# Patient Record
Sex: Female | Born: 1950
Health system: Southern US, Community
[De-identification: ages and names within clinical notes are randomized; demographics above are authoritative.]

## PROBLEM LIST (undated history)

## (undated) DIAGNOSIS — E119 Type 2 diabetes mellitus without complications: Secondary | ICD-10-CM

## (undated) DIAGNOSIS — I2510 Atherosclerotic heart disease of native coronary artery without angina pectoris: Secondary | ICD-10-CM

## (undated) DIAGNOSIS — R011 Cardiac murmur, unspecified: Secondary | ICD-10-CM

## (undated) DIAGNOSIS — R112 Nausea with vomiting, unspecified: Secondary | ICD-10-CM

## (undated) DIAGNOSIS — Z86718 Personal history of other venous thrombosis and embolism: Secondary | ICD-10-CM

## (undated) DIAGNOSIS — E785 Hyperlipidemia, unspecified: Secondary | ICD-10-CM

## (undated) DIAGNOSIS — Z9289 Personal history of other medical treatment: Secondary | ICD-10-CM

## (undated) DIAGNOSIS — M1990 Unspecified osteoarthritis, unspecified site: Secondary | ICD-10-CM

## (undated) DIAGNOSIS — Z8719 Personal history of other diseases of the digestive system: Secondary | ICD-10-CM

## (undated) DIAGNOSIS — N3010 Interstitial cystitis (chronic) without hematuria: Secondary | ICD-10-CM

## (undated) DIAGNOSIS — K635 Polyp of colon: Secondary | ICD-10-CM

## (undated) DIAGNOSIS — Z9109 Other allergy status, other than to drugs and biological substances: Secondary | ICD-10-CM

## (undated) DIAGNOSIS — I350 Nonrheumatic aortic (valve) stenosis: Secondary | ICD-10-CM

## (undated) DIAGNOSIS — D649 Anemia, unspecified: Secondary | ICD-10-CM

## (undated) DIAGNOSIS — Z9889 Other specified postprocedural states: Secondary | ICD-10-CM

## (undated) DIAGNOSIS — I10 Essential (primary) hypertension: Secondary | ICD-10-CM

## (undated) DIAGNOSIS — K219 Gastro-esophageal reflux disease without esophagitis: Secondary | ICD-10-CM

## (undated) DIAGNOSIS — IMO0001 Reserved for inherently not codable concepts without codable children: Secondary | ICD-10-CM

## (undated) HISTORY — DX: Hyperlipidemia, unspecified: E78.5

## (undated) HISTORY — PX: CATARACT EXTRACTION, BILATERAL: SHX1313

## (undated) HISTORY — DX: Essential (primary) hypertension: I10

## (undated) HISTORY — PX: BILATERAL SALPINGOOPHORECTOMY: SHX1223

## (undated) HISTORY — PX: CYSTOSCOPY: SHX5120

## (undated) HISTORY — PX: APPENDECTOMY: SHX54

## (undated) HISTORY — DX: Interstitial cystitis (chronic) without hematuria: N30.10

## (undated) HISTORY — PX: OTHER SURGICAL HISTORY: SHX169

## (undated) HISTORY — PX: TOTAL ABDOMINAL HYSTERECTOMY: SHX209

---

## 1969-01-16 DIAGNOSIS — Z86718 Personal history of other venous thrombosis and embolism: Secondary | ICD-10-CM

## 1969-01-16 HISTORY — DX: Personal history of other venous thrombosis and embolism: Z86.718

## 1999-03-25 ENCOUNTER — Other Ambulatory Visit: Admission: RE | Admit: 1999-03-25 | Discharge: 1999-03-25 | Payer: Self-pay | Admitting: *Deleted

## 1999-07-10 ENCOUNTER — Encounter: Admission: RE | Admit: 1999-07-10 | Discharge: 1999-07-10 | Payer: Self-pay | Admitting: Urology

## 1999-07-10 ENCOUNTER — Encounter: Payer: Self-pay | Admitting: Urology

## 1999-08-01 ENCOUNTER — Ambulatory Visit (HOSPITAL_COMMUNITY): Admission: RE | Admit: 1999-08-01 | Discharge: 1999-08-01 | Payer: Self-pay | Admitting: *Deleted

## 1999-08-01 ENCOUNTER — Encounter (INDEPENDENT_AMBULATORY_CARE_PROVIDER_SITE_OTHER): Payer: Self-pay

## 1999-08-13 ENCOUNTER — Ambulatory Visit (HOSPITAL_COMMUNITY): Admission: RE | Admit: 1999-08-13 | Discharge: 1999-08-13 | Payer: Self-pay | Admitting: *Deleted

## 1999-08-13 ENCOUNTER — Encounter: Payer: Self-pay | Admitting: *Deleted

## 2000-07-02 ENCOUNTER — Other Ambulatory Visit: Admission: RE | Admit: 2000-07-02 | Discharge: 2000-07-02 | Payer: Self-pay | Admitting: *Deleted

## 2001-04-21 ENCOUNTER — Ambulatory Visit (HOSPITAL_COMMUNITY): Admission: RE | Admit: 2001-04-21 | Discharge: 2001-04-21 | Payer: Self-pay | Admitting: Gastroenterology

## 2002-02-27 ENCOUNTER — Encounter: Payer: Self-pay | Admitting: Internal Medicine

## 2002-02-27 ENCOUNTER — Inpatient Hospital Stay (HOSPITAL_COMMUNITY): Admission: EM | Admit: 2002-02-27 | Discharge: 2002-03-01 | Payer: Self-pay | Admitting: Emergency Medicine

## 2003-05-19 HISTORY — PX: COLONOSCOPY: SHX174

## 2003-12-04 ENCOUNTER — Ambulatory Visit (HOSPITAL_COMMUNITY): Admission: RE | Admit: 2003-12-04 | Discharge: 2003-12-04 | Payer: Self-pay | Admitting: Urology

## 2004-04-09 ENCOUNTER — Ambulatory Visit: Payer: Self-pay | Admitting: Internal Medicine

## 2007-09-06 ENCOUNTER — Emergency Department (HOSPITAL_COMMUNITY): Admission: EM | Admit: 2007-09-06 | Discharge: 2007-09-06 | Payer: Self-pay | Admitting: Emergency Medicine

## 2007-09-23 ENCOUNTER — Encounter (INDEPENDENT_AMBULATORY_CARE_PROVIDER_SITE_OTHER): Payer: Self-pay | Admitting: *Deleted

## 2008-07-19 ENCOUNTER — Encounter: Payer: Self-pay | Admitting: Internal Medicine

## 2010-10-03 NOTE — H&P (Signed)
[name], [name]                        ACCOUNT NO.:  [id number]   MEDICAL RECORD NO.:  [id number]                   PATIENT TYPE:  INP   LOCATION:  1828                                 FACILITY:  [facility]   PHYSICIAN:  Veneda Melter, M.D. [facility]               DATE OF BIRTH:  10-20-1950   DATE OF ADMISSION:  02/27/2002  DATE OF DISCHARGE:                                HISTORY & PHYSICAL   HISTORY:  [name] is a 60 year old white female without prior cardiac  history who presents with substernal chest pressure. The patient noted onset  of pressure yesterday evening approximately 9 p.m. This was associated with  some shortness of breath, no nausea or vomiting, mild diaphoresis. It  subsided some, and the patient went to sleep; however, through the night,  she had intermittent episodes of discomfort and awoke at 4 a.m. with  substernal chest pressure. She has continued to have on and off pain. It  seemed to increase some with exertion and was alleviated with rest. She also  noted significant fatigue the past 24 hours and finally presented to the  emergency room where her pain is subsiding with IV nitroglycerin. She denies  any syncope or presyncope. No palpitations. No bright red blood per rectum,  melena, hematuria, or dysuria. She has had intentional weight loss of 35  pounds in the last six months and has been exercising on a regular basis.  Review of systems otherwise also notable for mild headaches in the morning,  otherwise negative.   PAST MEDICAL HISTORY:  Notable for gastroesophageal reflux disease, mitral  valve prolapse, recent history of anorexia.   PAST SURGICAL HISTORY:  She is status post total abdominal hysterectomy with  bilateral salpingo-oophorectomy and appendectomy.   ALLERGIES:  Topical iodine which causes blistering, sulfa.   CURRENT MEDICATIONS:  Nexium 40 mg q.d.   SOCIAL HISTORY:  The patient lives in [place]. She is married. 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 She has a  history of tobacco abuse, 35 pack years, quit 16 years ago. Uses alcohol  socially.   FAMILY HISTORY:  Notable for coronary artery disease in her father who  developed this in his 83s.   PHYSICAL EXAMINATION:  GENERAL:  This is a well-developed, well-nourished  white female in no acute distress. She is afebrile. Blood pressure of  118/63, pulse 74, respirations 20.  HEENT:  Pupils are equal, round, and reactive to light. Extraocular  movements are intact. Oropharynx shows no lesions.  NECK:  Supple without adenopathy, no bruits.  HEART:  Regular rate without murmurs.  LUNGS:  Clear to auscultation.  ABDOMEN:  Soft, nontender.  EXTREMITIES:  No edema. Peripheral pulses are 3+ and equal bilaterally.  Motor strength is 5/5. Sensory is intact to touch. Cranial nerves II-XII are  intact.   LABORATORY DATA:  ECG shows normal sinus rhythm  at 67 with no acute ischemic  changes. Laboratory data:  White count is 5.4, hemoglobin of 13.8,  hematocrit 41.0, platelets 285. Sodium is 137, potassium 3.4, chloride 103,  bicarb 27, BUN 14, creatinine 0.9. Initial CK is 80, MB is 3.2, troponin I  is 0.01.   ASSESSMENT/PLAN:  [name] is a 60 year old female who presents with almost  24-hour history of substernal chest pain with associated shortness of breath  and diaphoresis. This pain is clearly different than her reflux pain which  is a lower, more of a burning pain in the mid epigastrium. This feels more  as though someone is sitting on her chest. This is very worsening for  unstable angina. The patient will be admitted to the hospital, stabilized  medically with IV nitroglycerin. She will be given Lovenox, aspirin, and  Plavix. We will consider use of 2B3 should she have persistence of pain.  Initial enzymes do not suggest acute injury. We will follow these over the  next 24 hours. We will also replete her hypokalemia with oral   supplementation. Given her high probability for coronary artery disease,  discussed further treatment options with the patient including stress injury  study, cardiac catheterization, and medical therapy and have recommended  that we proceed with cardiac catheterization. The risks, benefits, and  alternatives of cardiac catheterization and possible angioplasty were  discussed with the patient who full understands and agrees to proceed. This  will be arranged for her tomorrow.                                               Veneda Melter, M.D. [facility]    NG/MEDQ  D:  02/27/2002  T:  02/28/2002  Job:  [id number]   cc:   Titus Dubin. Alwyn Ren, M.D. [facility]

## 2010-10-03 NOTE — Op Note (Signed)
[facility]  Patient:    Heather [name], Heather [name]                     MRN: [id number] Proc. Date: 08/01/99 Adm. Date:  [id number] Disc. Date: [id number] Attending:  Pleas Koch                           Operative Report  PREOPERATIVE DIAGNOSES:       Postmenopausal, right adnexal mass and right lower quadrant pain.  POSTOPERATIVE DIAGNOSES:      Right hydrosalpinx and pelvic adhesions of omentum to anterior abdominal wall.  OPERATION:                    Laparoscopic right salpingo-oophorectomy and lysis of adhesions.  SURGEON:                      Georgina Peer, M.D.  ASSISTANT:                    Henreitta Leber, P.A.  ANESTHESIA:                   General, Ellison Hughs., M.D.  ESTIMATED BLOOD LOSS:         Less than 25 cc.  COMPLICATIONS:                None.  INDICATIONS:                  This is a 60 year old postmenopausal, post-hysterectomy patient, status post hysterectomy and left salpingo-oophorectomy, with a history of one year of right lower quadrant pain.  She had a multicystic  mass on ultrasound and a normal CA-125.  She is also having bladder symptoms and sees Dr. Lindaann Slough, who has planned cystoscopy after this procedure.  She  understands risks and complications of laparoscopy including bowel, bladder or vascular injury and possible need for laparotomy.  She accepts these and is willing to proceed.  DESCRIPTION OF PROCEDURE:     Patient was taken to the operating room, given a general anesthetic and placed in a dorsal lithotomy position with the legs in Allen stirrups.  She was prepped with Hibiclens, abdominal and vaginal.  Her urethra as cleansed and then a red rubber catheter emptied her bladder.  A vertical subumbilical incision was made.  A sponge stick was placed in the vagina to locate its apex.  Three liters of CO2 were insufflated, creating a pneumoperitoneum under low pressures.   Laparoscopic trocar and sleeve were introduced.  A 5-mm suprapubic trocar were introduced under direct vision and a right mid-quadrant 5-mm port introduced under direct vision.  The following operative findings were noted: There appeared to be no injury from laparoscopic trocar placement.  There were omental adhesions to the anterior abdominal wall in the right mid-quadrant. There was a hydrosalpinx, which was tortuous and dilated, which was proximal to the right ovary which was not cystic.  The ovary was freely mobile and was not attached to the pelvic sidewall nor bladder.  The ureter was clearly identified distant from this complex.  The left ovary had been surgically removed and metal staples were noted and two small peritoneal cysts were also noted.  The vaginal cuff and peritoneal surface of the bladder was without pathology, as was the upper abdomen. Using bipolar cautery, the omental adhesions were taken down and sharply  dissected. Using tripolar 5-mm forceps, the infundibulopelvic ligament was cauterized and divided progressively down; the mesosalpinx was progressively cauterized and divided to its attachment just superior and lateral to the vaginal cuff.  The hydrosalpinx was then punctured and clear fluid egressed to allow for removal. The specimen was removed through the subumbilical port.  Ten cc of 0.25% Marcaine were drizzled on the operative site and 10 cc injected into the laparoscopic ports. The ports were removed, the air allowed to escape from the abdomen and the ports closed with 4-0 Dexon.  Sponge, needle and instrument counts were correct.  The patient remained under anesthesia for Dr. Lemont Fillers part of the case, which will be dictated as a separate procedure note. DD:  08/01/99 TD:  08/04/99 Job: [id number]

## 2010-10-03 NOTE — Cardiovascular Report (Signed)
   [name], [name]                        ACCOUNT NO.:  [id number]   MEDICAL RECORD NO.:  [id number]                   PATIENT TYPE:  INP   LOCATION:  6524                                 FACILITY:  [facility]   PHYSICIAN:  Salvadore Farber, M.D. [facility]         DATE OF BIRTH:  February 23, 1951   DATE OF PROCEDURE:  02/28/2002  DATE OF DISCHARGE:                              CARDIAC CATHETERIZATION   PROCEDURE:  Coronary angiography, left ventriculography, left heart  catheterization, aortography.   INDICATIONS FOR PROCEDURE:  Chest pain.   DIAGNOSTIC TECHNIQUE:  Informed consent was obtained.  Under 2% lidocaine  local anesthesia, a #6 French sheath was placed in the right femoral artery  using the modified Seldinger technique.  Angiography was performed using JL4  and JR4 catheters.  A pigtail catheter was advanced into the left ventricle.  Pressures were measured.  Ventriculography was performed via power  injection.  The catheter was then withdrawn to the ascending aorta.  Aortography was performed by power injection.  The patient tolerated the  procedure well and was transferred to the holding room in stable condition.  Sheaths are to be removed there.   COMPLICATIONS:  None.   FINDINGS:  1. Left main:  Angiographically normal.  2. LAD:  The LAD is a large vessel giving rise to a single large diagonal     branch.  It is angiographically normal.  3. Circumflex:  The circumflex gives rise to a single obtuse marginal     branch.  It is angiographically normal.  4. RCA:  The right coronary artery supplies the PDA and has no significant     stenoses.  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  5. No mitral regurgitation.  6. No aortic stenosis.  7. EF equals 67% without regional wall motion abnormality.  8. LV:  121/3/10 after the administration of contrast.   IMPRESSION/PLAN:  The patient has no coronary  stenosis to account for her  chest pain.  She has normal left ventricular size and systolic function.  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  Shunt appears trivial.  Plan for evaluation of noncardiac etiology of her chest pain.                                               Salvadore Farber, M.D. [facility]    WED/MEDQ  D:  02/28/2002  T:  02/28/2002  Job:  [id number]   cc:   Titus Dubin. Alwyn Ren, M.D. [facility]   Veneda Melter, M.D. [facility]

## 2010-10-03 NOTE — H&P (Signed)
Heather [name], [name]                        ACCOUNT NO.:  [id number]   MEDICAL RECORD NO.:  [id number]                   PATIENT TYPE:  INP   LOCATION:  6524                                 FACILITY:  [facility]   PHYSICIAN:  Titus Dubin. Alwyn Ren, M.D. [facility]         DATE OF BIRTH:  February 17, 1951   DATE OF ADMISSION:  02/27/2002  DATE OF DISCHARGE:                                HISTORY & PHYSICAL   HISTORY OF PRESENT ILLNESS:  The patient is a 60 year old employee of the  [employer] who is now admitted to observation for atypical  chest pain.   On February 26, 2002, approximately 9 p.m., she experienced heaviness in her  chest and an associated anxiousness which lasted approximately two hours.  This occurred while she was lying on the couch and was not exacerbated by  ambulation.  She had no nausea or diaphoresis associated with this.   On October 13, she was awakened at 9 a.m. with similar symptoms of a  substernal pressure nature associated with shortness of breath and  diaphoresis.  The symptoms recurred intermittently throughout the day.  She  became uncomfortable enough to notify her fellow nursing staff, who arranged  from an EKG and physician evaluation.   The substernal pressure has not radiated to the neck or the arm.   PAST MEDICAL HISTORY:  Her past medical history includes gravida 2, para 2,  an appendectomy, a total abdominal hysterectomy and bilateral salpingo-  oophorectomy for dysfunctional bleeding.   FAMILY HISTORY:  Family history is positive for myocardial infarction in  grandfather and coronary artery disease in her father.  There is no diabetes  or cancer.   SOCIAL HISTORY:  She quit smoking in 1987.  She rarely drinks alcohol.  She  exercises in kick-boxing at least weekly but not recently.   ALLERGIES:  She is allergic to IODINE and SULFA.   REVIEW OF SYSTEMS:  Review of systems include some mild headache which is  nonspecific, but more diffuse  in the frontal areas; Aleve has not been of  benefit.  She denies any genitourinary or musculoskeletal symptoms.   PHYSICAL EXAMINATION:  GENERAL:  She appears uncomfortable and complexion is  somewhat sallow.  VITAL SIGNS:  Blood pressure is 140/80, pulse 82 and regular, respiratory  rate 15.  HEENT:  There are no arteriolar changes noted on fundal exam.  Otolaryngologic exam is unremarkable.  NODES:  She has no lymphadenopathy about the head, neck or axillae.  NECK:  Thyroid is normal to palpation.  CARDIAC:  She has a grade 1 systolic murmur, loudest at the right base.  An  aortic bruit is noted without an aneurysm.  There is also a click at the  apex without definite prolapse.  CHEST:  Chest is clear to auscultation.  ABDOMEN:  The abdomen is nontender and there are no organomegaly or masses.  EXTREMITIES:  All pulses are intact and there  is no edema.  Homans sign is  negative.  MUSCULOSKELETAL:  No deficits.  NEUROPSYCHIATRIC:  No deficits.  BREASTS, PELVIC AND RECTAL:  Deferred as they are not germane to this  admission.   LABORATORY AND ACCESSORY CLINICAL DATA:  EKG reveals minor nonspecific  changes.   ASSESSMENT AND PLAN:  She will be admitted for observation to rule out an  anginal component to a chest pain.  It is anticipated that if the cardiac  enzymes are negative, a stress Cardiolite will be pursued, or possibly even  catheterization.                                                Titus Dubin. Alwyn Ren, M.D. [facility]    WFH/MEDQ  D:  02/28/2002  T:  03/01/2002  Job:  [id number]

## 2010-10-03 NOTE — Discharge Summary (Signed)
Heather [name], Heather [name]                        ACCOUNT NO.:  [id number]   MEDICAL RECORD NO.:  [id number]                   PATIENT TYPE:  INP   LOCATION:  6524                                 FACILITY:  [facility]   PHYSICIAN:  Rene Paci, M.D. [facility]          DATE OF BIRTH:  1950/11/26   DATE OF ADMISSION:  02/27/2002  DATE OF DISCHARGE:  03/01/2002                                 DISCHARGE SUMMARY   DISCHARGE DIAGNOSES:  1. Chest pain.  2. Gastroesophageal reflux disease.  3. Hypokalemia.  4. Right groin hematoma.   HISTORY OF PRESENT ILLNESS:  Briefly, the patient is a 60 year old white  female with no prior history of coronary artery disease.  On the evening  prior to this admission she developed chest pain that was substernal without  radiation.  She did have some shortness of breath.  This lasted about two  hours.  It recurred in the early hours of the morning.  Since that time the  patient's pain has been intermittent but never completely resolved.  She  does not some increased pain with exertion and diminished pain with rest.  No other aggravating factors.  She also has complained of increased fatigue  for the past 24 hours.   PAST MEDICAL HISTORY:  1. Gastroesophageal reflux disease.  2. Mitral valve prolapse.  3. Distant history of anorexia.  4. Status post total abdominal hysterectomy and bilateral salpingo-     oophorectomy.  5. Status post appendectomy.   CARDIAC RISK FACTORS:  Positive for tobacco, hyperlipidemia.  Negative for  prior coronary disease, diabetes, hypertension or family history.   HOSPITAL COURSE:  #1 - CHEST PAIN:  The patient was admitted to rule out  myocardial infarction.  The patient was evaluated by cardiology who  initiated aspirin, nitroglycerin, beta blocker and Lovenox.  They also put  her on the schedule for a cardiac catheterization.  The patient's cardiac  enzymes were negative and she ruled out for myocardial infarction.  The  patient underwent the cardiac catheterization on February 28, 2002.  This  revealed normal coronaries with an ejection fraction of 67%, no mitral  regurgitation, no aortic stenosis.  The patient's post catheterization  course was complicated by a hematoma.  Currently the hematoma is stable  without any further bleeding.  She is hemodynamically stable as well.  No  further cardiac work up was indicated.  #2 - GASTROINTESTINAL:  The patient does have a history of gastroesophageal  reflux disease. Apparently she has a history of using a pH probe eval.  I'm  not sure what the results showed.  There was a questionable history of  gastritis by endoscopy in 1992.  Reportedly she also had a negative  colonoscopy in December of 2002.  We will have the patient continue her  proton pump inhibitor and follow up as an outpatient with Dr. Kinnie Scales.  We  did check a lipase and amylase which were  negative.  An abdominal ultrasound  has been scheduled to be done as an outpatient.   LABORATORY DATA:  At discharge the CBC was normal. BMET was normal.  Lipase  and amylase were normal.  Cardiac enzymes were negative.  Total cholesterol  was 253, triglycerides 155, HDL 61, LDL 161.   DISCHARGE MEDICATIONS:  Nexium 40 mg q.d.   FOLLOW UP:  Follow up with Dr. Kinnie Scales in one month, with Dr.  Alwyn Ren in two  to three weeks and Dr. Samule Ohm as instructed.     Cornell Barman, PA [facility]                    Rene Paci, M.D. [facility]    LC/MEDQ  D:  03/01/2002  T:  03/02/2002  Job:  [id number]   cc:   Griffith Citron, M.D.   Titus Dubin. Alwyn Ren, M.D. [facility]   Salvadore Farber, M.D. [facility]  [address] [place], [place] [address]  Fax: 1

## 2010-10-03 NOTE — Op Note (Signed)
[facility]  Patient:    Heather [name], Heather [name]                     MRN: [id number] Proc. Date: 08/01/99 Adm. Date:  [id number] Disc. Date: [id number] Attending:  Pleas Koch CC:         Georgina Peer, M.D.                           Operative Report  PREOPERATIVE DIAGNOSIS:       Recurrent urinary tract infection, rule out interstitial cystitis.  POSTOPERATIVE DIAGNOSIS:      Recurrent urinary tract infection.  PROCEDURE:                    Cystoscopy and ureteral dilation.  SURGEON:                      Lindaann Slough, M.D.  ANESTHESIA:                   General.  INDICATIONS:                  Patient is a 60 year old female who has been complaining of frequency, hesitancy, voiding a small amount of urine at a time nd suprapubic discomfort for about six months.  She was treated with antibiotics but her symptoms have persisted.  She is scheduled for laparoscopy by Dr. Georgina Peer and for cystoscopy by myself.  Laparoscopy was done by Dr. Elliot Gault.  DESCRIPTION OF PROCEDURE:     After the laparoscopy, the patient was placed in he dorsal lithotomy position.  A #7 thin Wappler cystoscope was inserted in the bladder.  The bladder mucosa is normal.  There is no stone or tumor in the bladder. The ureteral orifices are in normal position and shape with clear efflux. There was no evidence of submucosal hemorrhage.  The cystoscopy was done with the 4 oblique and the right-angle lenses.  The cystoscope was then removed.  The urethra was then dilated with #30-French.  The patient tolerated the procedure well and left the OR in satisfactory condition to postanesthesia care unit. DD:  08/01/99 TD:  08/04/99 Job: [id number]

## 2011-02-10 ENCOUNTER — Encounter: Payer: Self-pay | Admitting: Internal Medicine

## 2011-02-10 ENCOUNTER — Ambulatory Visit (INDEPENDENT_AMBULATORY_CARE_PROVIDER_SITE_OTHER): Payer: BC Managed Care – PPO | Admitting: Internal Medicine

## 2011-02-10 VITALS — BP 118/78 | HR 89 | Temp 98.5°F | Wt 183.0 lb

## 2011-02-10 DIAGNOSIS — I10 Essential (primary) hypertension: Secondary | ICD-10-CM

## 2011-02-10 MED ORDER — LISINOPRIL 20 MG PO TABS: 20.0000 mg | ORAL_TABLET | Freq: Every day | ORAL | Status: DC

## 2011-02-10 NOTE — Patient Instructions (Signed)
Blood Pressure Goal  Ideally is an AVERAGE < 135/85. This AVERAGE should be calculated from @ least 5-7 BP readings taken @ different times of day on different days of week. You should not respond to isolated BP readings , but rather the AVERAGE for that week  

## 2011-02-10 NOTE — Progress Notes (Signed)
  Subjective:    Patient ID: Heather [name], female    DOB: 18-Aug-1950, 60 y.o.   MRN: [id number]  HPI  HYPERTENSION: Disease Monitoring  Blood pressure range: 121/71- 149/78 (Note: off Lisinopril 10/12.5 mg X 1 month; she ran out)  Chest pain: no   Dyspnea: no   Claudication: no              Headaches: over crown, bilaterally; better with NSAIDS & rest   Lightheadedness: no   Urinary frequency: no   Edema: no    Preventitive Healthcare:  Exercise: no, but care provider for husband who has terminal hepatic failure ; Hospice involved   Diet Pattern: no plan  Salt Restriction: yes      Review of Systems     Objective:   Physical Exam General appearance : well nourished, w/o distress.  Eyes: No conjunctival inflammation or scleral icterus is present. Mild arteriolar narrowing     Heart:  Normal rate and regular rhythm. S1 and S2 normal without gallop, click, rub or other extra sounds . Grade 1/6 systolic murmur    Lungs:Chest clear to auscultation; no wheezes, rhonchi,rales ,or rubs present.No increased work of breathing.   Abdomen: bowel sounds normal, soft and non-tender without masses, organomegaly or hernias noted.  No guarding or rebound . No AAA or bruits  Skin:Warm & dry.  Intact without suspicious lesions or rashes           Assessment & Plan:  #1 hypertension, exacerbation by the stress of her husband's illness  Plan: Low-dose ACE inhibitor without  a diuretic unless she notes edema

## 2011-11-02 ENCOUNTER — Other Ambulatory Visit: Payer: Self-pay | Admitting: Internal Medicine

## 2012-04-16 ENCOUNTER — Other Ambulatory Visit: Payer: Self-pay | Admitting: Internal Medicine

## 2012-04-18 NOTE — Telephone Encounter (Signed)
Rx sent.     PLz schedule pt appt. Last OV 02/10/11.   MW

## 2012-04-22 NOTE — Telephone Encounter (Signed)
Pt scheduled for 07/09/11 at 1:30pm.

## 2012-04-22 NOTE — Telephone Encounter (Signed)
Lmovm for pt to call office. Pt needs CPE.

## 2012-05-03 ENCOUNTER — Other Ambulatory Visit: Payer: Self-pay | Admitting: Internal Medicine

## 2012-07-08 ENCOUNTER — Ambulatory Visit (INDEPENDENT_AMBULATORY_CARE_PROVIDER_SITE_OTHER): Payer: BC Managed Care – PPO | Admitting: Internal Medicine

## 2012-07-08 ENCOUNTER — Encounter: Payer: Self-pay | Admitting: Internal Medicine

## 2012-07-08 VITALS — BP 132/78 | HR 79 | Temp 97.9°F | Resp 12 | Ht 61.5 in | Wt 183.0 lb

## 2012-07-08 DIAGNOSIS — Z0000 Encounter for general adult medical examination without abnormal findings: Secondary | ICD-10-CM

## 2012-07-08 DIAGNOSIS — N3010 Interstitial cystitis (chronic) without hematuria: Secondary | ICD-10-CM | POA: Insufficient documentation

## 2012-07-08 DIAGNOSIS — I10 Essential (primary) hypertension: Secondary | ICD-10-CM

## 2012-07-08 MED ORDER — LISINOPRIL 20 MG PO TABS: ORAL_TABLET | ORAL | Status: DC

## 2012-07-08 NOTE — Patient Instructions (Addendum)
Preventive Health Care: Exercise  30-45  minutes a day, 3-4 days a week. Walking is especially valuable in preventing Osteoporosis. Eat a low-fat diet with lots of fruits and vegetables, up to 7-9 servings per day. This would eliminate need for vitamin supplements for most individuals. Consume less than 30 grams of sugar per day from foods & drinks with High Fructose Corn Syrup as #1,2,3 or #4 on label. Please  schedule fasting Labs : BMET,Lipids, hepatic panel, CBC & dif, TSH.PLEASE BRING THESE INSTRUCTIONS TO FOLLOW UP  LAB APPOINTMENT.This will guarantee correct labs are drawn, eliminating need for repeat blood sampling ( needle sticks ! ). Diagnoses /Codes:V70.0.  If you activate the  My Chart system; lab & Xray results will be released directly  to you as soon as I review & address these through the computer. As per [place] all records must be reviewed and signed off within 72 hours; but I attempt to complete this within 36 hours unless I have no access to the electronic medical record.If I wait more than 36 hours the volume of reports becomes difficult to manage optimally. In my absence ;my partners would address the results.Critical lab results will be communicated to you ASAP. If you choose not to sign up for My Chart within 36 hours of labs being drawn; results will be reviewed & interpretation added before being copied & mailed, causing a delay in getting the results to you.If you do not receive that report within 7-10 days ,please call. Additionally you can use this system to gain direct  access to your records  if  out of town or @ an office of a  physician who is not in  the My Chart network.  This improves continuity of care & places you in control of your medical record.

## 2012-07-08 NOTE — Progress Notes (Signed)
  Subjective:    Patient ID: Heather [name], female    DOB: Jun 23, 1950, 62 y.o.   MRN: [id number]  HPI  Heather [name] is here for a physical;acute issues include tender posterior scalp area "lumps" & elevated sugars.  She has sweats which are menopausal. She's actually had some weight gain. She denies fever or chills.     Review of Systems  HYPERTENSION: Disease Monitoring: Blood pressure range-120-130/70-82  Chest pain, palpitations- no      Dyspnea- no Medications: Compliance- yes  Lightheadedness,Syncope- no Edema- no  PMH HYPERGLYCEMIA; Disease Monitoring: Blood Sugar ranges-home 6-6.2%   Polyuria/phagia/dipsia- no      Visual problems-no            Objective:   Physical Exam Gen.: Healthy and well-nourished in appearance. Alert, appropriate and cooperative throughout exam.Appears younger than stated age  Head: Normocephalic without obvious abnormalities. Eyes: No corneal or conjunctival inflammation noted. Pupils equal round reactive to light and accommodation.  Extraocular motion intact.Mono vision  lenses Ears: External  ear exam reveals no significant lesions or deformities. Canals clear .TMs normal. Hearing is grossly normal bilaterally. Nose: External nasal exam reveals no deformity or inflammation. Nasal mucosa are pink and moist. No lesions or exudates noted.  Mouth: Oral mucosa and oropharynx reveal no lesions or exudates. Teeth in good repair. Neck: No deformities, masses, or tenderness noted. Range of motion & Thyroid normal. Lungs: Normal respiratory effort; chest expands symmetrically. Lungs are clear to auscultation without rales, wheezes, or increased work of breathing. Heart: Normal rate and rhythm. Normal S1 and S2. No gallop, click, or rub. Grade 1/6 systolic murmur. Abdomen: Bowel sounds normal; abdomen soft and nontender. No masses, organomegaly or hernias noted. Genitalia: S/P TAH & BSO.                                  Musculoskeletal/extremities: No  deformity or scoliosis noted of  the thoracic or lumbar spine.  No clubbing, cyanosis, edema, or significant extremity  deformity noted. Range of motion normal .Tone & strength  Normal. Joints normal . Nail health good. Able to lie down & sit up w/o help. Negative SLR bilaterally Vascular: Carotid, radial artery, dorsalis pedis and  posterior tibial pulses are full and equal. No bruits present. Neurologic: Alert and oriented x3. Deep tendon reflexes symmetrical and normal.         Skin: Intact without suspicious lesions or rashes. Lymph: No cervical, axillary lymphadenopathy present. Psych: Mood and affect are normal. Normally interactive                                                                                        Assessment & Plan:  #1 comprehensive physical exam; no acute findings  Plan: see Orders  & Recommendations

## 2012-07-26 ENCOUNTER — Ambulatory Visit (INDEPENDENT_AMBULATORY_CARE_PROVIDER_SITE_OTHER): Payer: BC Managed Care – PPO | Admitting: Internal Medicine

## 2012-07-26 ENCOUNTER — Telehealth: Payer: Self-pay | Admitting: Internal Medicine

## 2012-07-26 ENCOUNTER — Encounter: Payer: Self-pay | Admitting: Internal Medicine

## 2012-07-26 VITALS — BP 134/88 | HR 68 | Temp 98.0°F | Wt 183.0 lb

## 2012-07-26 DIAGNOSIS — R599 Enlarged lymph nodes, unspecified: Secondary | ICD-10-CM

## 2012-07-26 DIAGNOSIS — R590 Localized enlarged lymph nodes: Secondary | ICD-10-CM

## 2012-07-26 LAB — CBC WITH DIFFERENTIAL/PLATELET
Basophils Absolute: 0 10*3/uL (ref 0.0–0.1)
Eosinophils Absolute: 0.2 10*3/uL (ref 0.0–0.7)
HCT: 40.5 % (ref 36.0–46.0)
Lymphs Abs: 2.1 10*3/uL (ref 0.7–4.0)
MCHC: 33.2 g/dL (ref 30.0–36.0)
MCV: 77.6 fl — ABNORMAL LOW (ref 78.0–100.0)
Monocytes Absolute: 0.4 10*3/uL (ref 0.1–1.0)
Monocytes Relative: 6.9 % (ref 3.0–12.0)
Platelets: 362 10*3/uL (ref 150.0–400.0)
RDW: 13.8 % (ref 11.5–14.6)

## 2012-07-26 LAB — SEDIMENTATION RATE: Sed Rate: 18 mm/hr (ref 0–22)

## 2012-07-26 MED ORDER — DOXYCYCLINE HYCLATE 100 MG PO TABS: 100.0000 mg | ORAL_TABLET | Freq: Two times a day (BID) | ORAL | Status: DC

## 2012-07-26 NOTE — Telephone Encounter (Signed)
Appointment Scheduled:  07/26/2012 11:30:00  Appointment Scheduled Provider:  N/A

## 2012-07-26 NOTE — Patient Instructions (Addendum)
If you activate the  My Chart system; lab & Xray results will be released directly  to you as soon as I review & address these through the computer. As per [place] [facility] [facility] all records must be reviewed and signed off within 72 hours; but I attempt to complete this within 36 hours unless I have no access to the electronic medical record.If I wait more than 36 hours the volume of reports becomes difficult to manage optimally. In my absence ;my partners would address the results.Critical lab results will be communicated to you ASAP. If you choose not to sign up for My Chart within 36 hours of labs being drawn; results will be reviewed & interpretation added before being copied & mailed, causing a delay in getting the results to you.If you do not receive that report within 7-10 days ,please call. Additionally you can use this system to gain direct  access to your records  if  out of town or @ an office of a  physician who is not in  the My Chart network.  This improves continuity of care & places you in control of your medical record.  Share results with all non [facility] medical staff seen

## 2012-07-26 NOTE — Progress Notes (Signed)
  Subjective:    Patient ID: Heather [name], female    DOB: 1951/02/23, 62 y.o.   MRN: [id number]  HPI She's had tender lymphadenopathy in the posterior cervical chain as well as the right clavicle for several weeks. She was seen at urgent care 07/20/12 and cephalexin prescribed. There is not been a significant response to this  She has had some sweats but this is a chronic issue. She has had bilateral frontal headaches.  She does foster kittens but denies having been scratched about the head and neck areas. She does donate her hair; the last time the posterior scalp area when she was last summer however.    Review of Systems She denies associated fever , weight loss or chills. She denies facial pain, nasal purulence, sore throat, dental pain ,otic pain or otic discharge. She denies any cough or sputum production       Objective:   Physical Exam She appears healthy and well-nourished in no distress  There's an 18 x 18 mm firm subcutaneous lesion at the left posterior scalp line. There is a pea-sized lesion on the contralateral scalp line @ that same area. There's a small pea size lymph node posterior to the right clavicle. There are minor subcutaneous changes at the posterior scalp line. The overlying skin is not cellulitic; there is no change in temperature or color.  Thyroid is normal to palpation.  She has no axillary lymphadenopathy  Chest is clear without abnormal breath sounds. Heart sounds regular with no murmur.  She has no organomegaly or masses        Assessment & Plan:  #1 cervical lymphadenopathy bilaterally.  Plan: She will be changed to doxycycline. Sedimentation rate and CBC and differential will be collected.  If there is not dramatic improvement; however recommend General Surgery evaluation.

## 2012-07-26 NOTE — Telephone Encounter (Signed)
Patient Information:  Caller Name: Lawson  Phone: [phone]  Patient: Terrel, Manalo  Gender: Female  DOB: 09/07/1950  Age: 62 Years  PCP: Nicki Reaper  Office Follow Up:  Does the office need to follow up with this patient?: No  Instructions For The Office: N/A  RN Note:  Patient is a patient of Dr. Alwyn Ren at [facility] office, but call came in to [employer].  Has two swollen lymph nodes at back of neck, and one on shoulder/clavicle.  They are tender to touch.  Afebrile.  Started cefalexin 07/18/12 after being seen in [facility].  UC physician frightened the patient by stating "if the nodes did not improve, he might have to do a cancer workup."  One node is quarter-sized, but not larger.  Per lymph node protocol, emergent symptoms denied; advised appt within 24 hours.  Appt scheduled at [facility] office 07/26/12 1130 with Dr. Alwyn Ren.  krs/can  Symptoms  Reason For Call & Symptoms: Seen by Dr. Alwyn Ren at the end of February for swollen lymph nodes.  Placed on cefalexin TID x 10 days, but lumps continued.  Reviewed Health History In EMR: Yes  Reviewed Medications In EMR: Yes  Reviewed Allergies In EMR: Yes  Reviewed Surgeries / Procedures: Yes  Date of Onset of Symptoms: Unknown  Guideline(s) Used:  Lymph Nodes - Swollen  Disposition Per Guideline:   See Today or Tomorrow in Office  Reason For Disposition Reached:   Very tender to the touch but no fever  Advice Given:  N/A  Appointment Scheduled:  07/26/2012 11:30:00 Appointment Scheduled Provider:  N/A

## 2012-07-31 ENCOUNTER — Encounter: Payer: Self-pay | Admitting: Internal Medicine

## 2012-08-01 ENCOUNTER — Other Ambulatory Visit: Payer: Self-pay | Admitting: Internal Medicine

## 2012-08-01 DIAGNOSIS — R590 Localized enlarged lymph nodes: Secondary | ICD-10-CM

## 2012-08-09 ENCOUNTER — Encounter (INDEPENDENT_AMBULATORY_CARE_PROVIDER_SITE_OTHER): Payer: Self-pay | Admitting: Surgery

## 2012-08-09 ENCOUNTER — Ambulatory Visit (INDEPENDENT_AMBULATORY_CARE_PROVIDER_SITE_OTHER): Payer: BC Managed Care – PPO | Admitting: Surgery

## 2012-08-09 ENCOUNTER — Telehealth (INDEPENDENT_AMBULATORY_CARE_PROVIDER_SITE_OTHER): Payer: Self-pay | Admitting: General Surgery

## 2012-08-09 VITALS — BP 144/86 | HR 80 | Temp 97.6°F | Resp 14 | Ht 62.0 in | Wt 181.8 lb

## 2012-08-09 DIAGNOSIS — R590 Localized enlarged lymph nodes: Secondary | ICD-10-CM

## 2012-08-09 DIAGNOSIS — R599 Enlarged lymph nodes, unspecified: Secondary | ICD-10-CM

## 2012-08-09 NOTE — Patient Instructions (Addendum)
Will schedule CT neck and chest to evaluate nodes.  Check other blood work as well.Lymphadenopathy Lymphadenopathy means "disease of the lymph glands." But the term is usually used to describe swollen or enlarged lymph glands, also called lymph nodes. These are the bean-shaped organs found in many locations including the neck, underarm, and groin. Lymph glands are part of the immune system, which fights infections in your body. Lymphadenopathy can occur in just one area of the body, such as the neck, or it can be generalized, with lymph node enlargement in several areas. The nodes found in the neck are the most common sites of lymphadenopathy. CAUSES  When your immune system responds to germs (such as viruses or bacteria ), infection-fighting cells and fluid build up. This causes the glands to grow in size. This is usually not something to worry about. Sometimes, the glands themselves can become infected and inflamed. This is called lymphadenitis. Enlarged lymph nodes can be caused by many diseases:  Bacterial disease, such as strep throat or a skin infection.  Viral disease, such as a common cold.  Other germs, such as lyme disease, tuberculosis, or sexually transmitted diseases.  Cancers, such as lymphoma (cancer of the lymphatic system) or leukemia (cancer of the white blood cells).  Inflammatory diseases such as lupus or rheumatoid arthritis.  Reactions to medications. Many of the diseases above are rare, but important. This is why you should see your caregiver if you have lymphadenopathy. SYMPTOMS   Swollen, enlarged lumps in the neck, back of the head or other locations.  Tenderness.  Warmth or redness of the skin over the lymph nodes.  Fever. DIAGNOSIS  Enlarged lymph nodes are often near the source of infection. They can help healthcare providers diagnose your illness. For instance:   Swollen lymph nodes around the jaw might be caused by an infection in the mouth.  Enlarged  glands in the neck often signal a throat infection.  Lymph nodes that are swollen in more than one area often indicate an illness caused by a virus. Your caregiver most likely will know what is causing your lymphadenopathy after listening to your history and examining you. Blood tests, x-rays or other tests may be needed. If the cause of the enlarged lymph node cannot be found, and it does not go away by itself, then a biopsy may be needed. Your caregiver will discuss this with you. TREATMENT  Treatment for your enlarged lymph nodes will depend on the cause. Many times the nodes will shrink to normal size by themselves, with no treatment. Antibiotics or other medicines may be needed for infection. Only take over-the-counter or prescription medicines for pain, discomfort or fever as directed by your caregiver. HOME CARE INSTRUCTIONS  Swollen lymph glands usually return to normal when the underlying medical condition goes away. If they persist, contact your health-care provider. He/she might prescribe antibiotics or other treatments, depending on the diagnosis. Take any medications exactly as prescribed. Keep any follow-up appointments made to check on the condition of your enlarged nodes.  SEEK MEDICAL CARE IF:   Swelling lasts for more than two weeks.  You have symptoms such as weight loss, night sweats, fatigue or fever that does not go away.  The lymph nodes are hard, seem fixed to the skin or are growing rapidly.  Skin over the lymph nodes is red and inflamed. This could mean there is an infection. SEEK IMMEDIATE MEDICAL CARE IF:   Fluid starts leaking from the area of the enlarged lymph  node.  You develop a fever of 102 F (38.9 C) or greater.  Severe pain develops (not necessarily at the site of a large lymph node).  You develop chest pain or shortness of breath.  You develop worsening abdominal pain. MAKE SURE YOU:   Understand these instructions.  Will watch your  condition.  Will get help right away if you are not doing well or get worse. Document Released: 02/11/2008 Document Revised: 07/27/2011 Document Reviewed: 02/11/2008 [facility] Patient Information 2013 [place], [place].

## 2012-08-09 NOTE — Telephone Encounter (Signed)
Spoke with patient she is aware of appt for CT scan on 08/12/12 @7 :45

## 2012-08-09 NOTE — Progress Notes (Signed)
Patient ID: Heather [name], female   DOB: 1950-08-30, 62 y.o.   MRN: [id number]  No chief complaint on file.   HPI Heather [name] is a 62 y.o. female. Patient sent  at the request of Dr. Alwyn Ren do to cervical and supraclavicular lymphadenopathy. She noticed enlarged lymph nodes about 2 months ago. His been on multiple antibiotics most recently doxycycline which has helped some but has not resolved the condition. She does have sweats during the day and night. No history of weight loss. No chronic cough. No sore throat. No recent exposure to any viral infections or influenza. She does have cats at home denies any trauma or scratching. No travel to the outside countries  or desert regions HPI  Past Medical History  Diagnosis Date  . Hypertension   . Interstitial cystitis     Dr Brunilda Payor    Past Surgical History  Procedure Laterality Date  . Cystoscopy      X 3; Dr Brunilda Payor  . Appendectomy    . Total abdominal hysterectomy      metromenorrhagia  . Bilateral salpingoophorectomy      painful cysts    Family History  Problem Relation Age of Onset  . Heart attack Mother     in 64s  . Hypertension Mother   . Stroke Mother      in 67s  . Stroke Maternal Grandfather 50  . Cancer Neg Hx   . Diabetes Neg Hx   . Heart attack Paternal Grandfather     in 21s    Social History History  Substance Use Topics  . Smoking status: Former Smoker    Quit date: 05/18/1984  . Smokeless tobacco: Not on file  . Alcohol Use: No    Allergies  Allergen Reactions  . Sulfonamide Derivatives     nausea    Current Outpatient Prescriptions  Medication Sig Dispense Refill  . citalopram (CELEXA) 40 MG tablet Take 40 mg by mouth daily.       Marland Kitchen lisinopril (PRINIVIL,ZESTRIL) 20 MG tablet       . traMADol (ULTRAM) 50 MG tablet Take 50 mg by mouth as needed.        Marland Kitchen lisinopril (PRINIVIL,ZESTRIL) 20 MG tablet TAKE ONE TABLET BY MOUTH ONE TIME DAILY  90 tablet  3   No current facility-administered  medications for this visit.    Review of Systems Review of Systems  Constitutional: Positive for fever and fatigue. Negative for unexpected weight change.  HENT: Positive for rhinorrhea. Negative for hearing loss, ear pain, congestion, facial swelling, sneezing, neck pain, neck stiffness, postnasal drip, tinnitus and ear discharge.   Eyes: Negative.   Respiratory: Negative.   Cardiovascular: Negative.   Endocrine: Negative.   Genitourinary: Positive for dysuria.  Allergic/Immunologic: Negative.   Neurological: Negative.   Hematological: Negative.   Psychiatric/Behavioral: Negative.     Blood pressure 144/86, pulse 80, temperature 97.6 F (36.4 C), resp. rate 14, height 5\' 2"  (1.575 m), weight 181 lb 12.8 oz (82.464 kg).  Physical Exam Physical Exam  Constitutional: She is oriented to person, place, and time. She appears well-developed and well-nourished.  HENT:  Head: Normocephalic and atraumatic.  Eyes: EOM are normal. Pupils are equal, round, and reactive to light.  Neck: Normal range of motion.  Cardiovascular: Normal rate and regular rhythm.   Pulmonary/Chest: Effort normal and breath sounds normal.  Musculoskeletal: Normal range of motion.  Lymphadenopathy:       Head (right side): Tonsillar adenopathy present. No  submental, no submandibular, no preauricular, no posterior auricular and no occipital adenopathy present.       Head (left side): Tonsillar and posterior auricular adenopathy present. No submental, no submandibular and no occipital adenopathy present.    She has cervical adenopathy.       Right cervical: Superficial cervical, deep cervical and posterior cervical adenopathy present.    She has no axillary adenopathy.       Right: Supraclavicular adenopathy present.       Left: No supraclavicular adenopathy present.  Neurological: She is alert and oriented to person, place, and time.  Skin: Skin is warm and dry.  Psychiatric: She has a normal mood and affect. Her  behavior is normal. Thought content normal.    Data Reviewed Dr Frederik Pear notes  Assessment    Cervical and supraclavicular lymphadenopathy involving level 2,3, and 5 levels and left supraclavicular nodes    Plan    CT of neck and chest for further evaluation. Check LDH level. She has a history of tobacco abuse but stopped smoking over 25 years ago. Further evaluation once imaging done. Return to clinic 2 weeks.       Heather Tess A. 08/09/2012, 3:55 PM

## 2012-08-12 ENCOUNTER — Ambulatory Visit
Admission: RE | Admit: 2012-08-12 | Discharge: 2012-08-12 | Disposition: A | Discharge status: Home / Self Care | Payer: BC Managed Care – PPO | Source: Ambulatory Visit | Attending: Surgery | Admitting: Surgery

## 2012-08-12 DIAGNOSIS — R590 Localized enlarged lymph nodes: Secondary | ICD-10-CM

## 2012-08-12 MED ORDER — IOHEXOL 300 MG/ML  SOLN
75.0000 mL | Freq: Once | INTRAMUSCULAR | Status: AC | PRN
  Administered 2012-08-12: 75 mL via INTRAVENOUS

## 2012-08-15 ENCOUNTER — Encounter: Payer: Self-pay | Admitting: Internal Medicine

## 2012-08-15 ENCOUNTER — Telehealth (INDEPENDENT_AMBULATORY_CARE_PROVIDER_SITE_OTHER): Payer: Self-pay | Admitting: General Surgery

## 2012-08-15 ENCOUNTER — Telehealth (INDEPENDENT_AMBULATORY_CARE_PROVIDER_SITE_OTHER): Payer: Self-pay

## 2012-08-15 ENCOUNTER — Other Ambulatory Visit (INDEPENDENT_AMBULATORY_CARE_PROVIDER_SITE_OTHER): Payer: Self-pay | Admitting: Surgery

## 2012-08-15 DIAGNOSIS — R221 Localized swelling, mass and lump, neck: Secondary | ICD-10-CM

## 2012-08-15 NOTE — Telephone Encounter (Signed)
Discussed pt's CT scan results per Dr. Luisa Hart.  She will be scheduled for an MR MRA of the head, and an MRI of the brain.  Pt understands her results.

## 2012-08-15 NOTE — Telephone Encounter (Signed)
Spoke to  Patient she is aware of  appt at [address] [facility] 08/23/12 7:30Am  She will have labs done before scans at this site.

## 2012-08-15 NOTE — Telephone Encounter (Signed)
LM to call office regarding her CT scan and further studies.

## 2012-08-17 ENCOUNTER — Other Ambulatory Visit: Payer: Self-pay | Admitting: Internal Medicine

## 2012-08-22 ENCOUNTER — Encounter (INDEPENDENT_AMBULATORY_CARE_PROVIDER_SITE_OTHER): Payer: Self-pay | Admitting: Surgery

## 2012-08-22 ENCOUNTER — Ambulatory Visit (INDEPENDENT_AMBULATORY_CARE_PROVIDER_SITE_OTHER): Payer: BC Managed Care – PPO | Admitting: Surgery

## 2012-08-22 ENCOUNTER — Encounter: Payer: Self-pay | Admitting: Internal Medicine

## 2012-08-22 VITALS — BP 136/84 | HR 93 | Temp 97.0°F | Ht 62.0 in | Wt 185.4 lb

## 2012-08-22 DIAGNOSIS — R591 Generalized enlarged lymph nodes: Secondary | ICD-10-CM | POA: Insufficient documentation

## 2012-08-22 DIAGNOSIS — R599 Enlarged lymph nodes, unspecified: Secondary | ICD-10-CM

## 2012-08-22 NOTE — Progress Notes (Signed)
Patient returns in followup of her cervical lymphadenopathy. CT of her neck chest were obtained to exclude lymphoma and these are negative. 2 incidental findings of a possible vertebral artery aneurysm or small meningioma at the foramen magnum noted. MRI recommended the patient cannot afford this. She was informed of these findings today. Chest CT shows a 3 mm right upper lobe nodule which appears stable. She has a history of PPD exposure and has been followed for some time. I discussed the above findings with her today and answered questions. She is satisfied with not getting an MRI for now and understands the possible implications. 15 minutes spent discussing with patient the above. Return as needed. Copies of x-ray given the patient.  Clinical Data: Mass left posterior neck.  CT NECK WITH CONTRAST  Technique: Multidetector CT imaging of the neck was performed with  intravenous contrast.  Contrast: 75mL OMNIPAQUE IOHEXOL 300 MG/ML SOLN  Comparison: CT chest same day  Findings: Skin marker on the left is adjacent to an area of  nonspecific indistinct density in the subcutaneous fat of the left  posterior triangle that is most consistent with an area of  inflammation. No evidence of abscess or deep extension. There are  no pathologically enlarged lymph nodes anywhere within the neck.  Parotid, submandibular and thyroid glands appear normal. No  arterial or venous pathology is seen. No evidence of mucosal or  submucosal mass. No supraclavicular lesion is identified. There  is ordinary degenerative change of the spine.  Images in the region of the foramen magnum show what appears to be  an aneurysm arising from the left vertebral artery that measures a  centimeter in size. The differential diagnosis would be  meningioma. Either of these could be clinically quite significant  and therefore MRI with without contrast and MRA of the brain is  suggested for further evaluation.  IMPRESSION:  The skin  marker is adjacent to an area of nonspecific appearing  density in the subcutaneous fat in the left posterior triangle most  consistent with nonspecific inflammation. No evidence of abscess  or deep extension. No sign that this indicates a mass lesion.  There are no pathologically enlarged regional nodes.  1 cm enhancing lesion at the foramen magnum level on the left that  could be a vertebral artery aneurysm or a meningioma. Either of  these could be clinically significant and therefore further  evaluation is strongly suggested. MRI with without contrast and  MRA of the head recommended.  Original Report Authenticated By: Paulina Fusi, M.D.

## 2012-08-22 NOTE — Patient Instructions (Signed)
Return as needed

## 2012-08-23 ENCOUNTER — Other Ambulatory Visit: Payer: Self-pay | Admitting: Internal Medicine

## 2012-08-23 ENCOUNTER — Other Ambulatory Visit: Payer: BC Managed Care – PPO

## 2012-08-23 DIAGNOSIS — R590 Localized enlarged lymph nodes: Secondary | ICD-10-CM

## 2012-08-23 MED ORDER — DOXYCYCLINE HYCLATE 100 MG PO TABS: 100.0000 mg | ORAL_TABLET | Freq: Two times a day (BID) | ORAL | Status: DC

## 2012-08-23 NOTE — Telephone Encounter (Signed)
Hopp please advise  

## 2013-05-04 ENCOUNTER — Other Ambulatory Visit: Payer: Self-pay | Admitting: Internal Medicine

## 2013-05-05 NOTE — Telephone Encounter (Signed)
Lisinopril refilled per protocol. JG//CMA 

## 2013-05-19 ENCOUNTER — Other Ambulatory Visit: Payer: Self-pay | Admitting: Internal Medicine

## 2013-05-19 DIAGNOSIS — I10 Essential (primary) hypertension: Secondary | ICD-10-CM

## 2013-05-19 MED ORDER — LISINOPRIL 20 MG PO TABS: ORAL_TABLET | ORAL | Status: DC

## 2013-09-06 ENCOUNTER — Ambulatory Visit (INDEPENDENT_AMBULATORY_CARE_PROVIDER_SITE_OTHER): Payer: BC Managed Care – PPO | Admitting: Internal Medicine

## 2013-09-06 ENCOUNTER — Other Ambulatory Visit (INDEPENDENT_AMBULATORY_CARE_PROVIDER_SITE_OTHER): Payer: BC Managed Care – PPO

## 2013-09-06 ENCOUNTER — Encounter: Payer: Self-pay | Admitting: Internal Medicine

## 2013-09-06 VITALS — BP 150/100 | HR 69 | Temp 98.1°F | Wt 184.4 lb

## 2013-09-06 DIAGNOSIS — I10 Essential (primary) hypertension: Secondary | ICD-10-CM

## 2013-09-06 DIAGNOSIS — R519 Headache, unspecified: Secondary | ICD-10-CM | POA: Insufficient documentation

## 2013-09-06 DIAGNOSIS — R51 Headache: Secondary | ICD-10-CM

## 2013-09-06 DIAGNOSIS — R040 Epistaxis: Secondary | ICD-10-CM

## 2013-09-06 LAB — CBC WITH DIFFERENTIAL/PLATELET
BASOS PCT: 0.4 % (ref 0.0–3.0)
Basophils Absolute: 0 10*3/uL (ref 0.0–0.1)
EOS PCT: 1.2 % (ref 0.0–5.0)
Eosinophils Absolute: 0.1 10*3/uL (ref 0.0–0.7)
HEMATOCRIT: 40.5 % (ref 36.0–46.0)
HEMOGLOBIN: 13.6 g/dL (ref 12.0–15.0)
LYMPHS ABS: 2.8 10*3/uL (ref 0.7–4.0)
Lymphocytes Relative: 30.5 % (ref 12.0–46.0)
MCHC: 33.5 g/dL (ref 30.0–36.0)
MCV: 77.8 fl — AB (ref 78.0–100.0)
MONO ABS: 1 10*3/uL (ref 0.1–1.0)
MONOS PCT: 11.4 % (ref 3.0–12.0)
NEUTROS ABS: 5.1 10*3/uL (ref 1.4–7.7)
Neutrophils Relative %: 56.5 % (ref 43.0–77.0)
Platelets: 374 10*3/uL (ref 150.0–400.0)
RBC: 5.21 Mil/uL — AB (ref 3.87–5.11)
RDW: 14.9 % — ABNORMAL HIGH (ref 11.5–14.6)
WBC: 9.1 10*3/uL (ref 4.5–10.5)

## 2013-09-06 LAB — BASIC METABOLIC PANEL
BUN: 11 mg/dL (ref 6–23)
CHLORIDE: 103 meq/L (ref 96–112)
CO2: 30 mEq/L (ref 19–32)
Calcium: 9.7 mg/dL (ref 8.4–10.5)
Creatinine, Ser: 0.7 mg/dL (ref 0.4–1.2)
GFR: 97.25 mL/min (ref >60.00)
Glucose, Bld: 101 mg/dL — ABNORMAL HIGH (ref 70–99)
POTASSIUM: 4.1 meq/L (ref 3.5–5.1)
SODIUM: 139 meq/L (ref 135–145)

## 2013-09-06 MED ORDER — VERAPAMIL HCL 80 MG PO TABS: ORAL_TABLET | ORAL | Status: DC

## 2013-09-06 MED ORDER — VERAPAMIL HCL 80 MG PO TABS: 80.0000 mg | ORAL_TABLET | Freq: Three times a day (TID) | ORAL | Status: DC

## 2013-09-06 NOTE — Progress Notes (Signed)
Subjective:    Patient ID: Heather [name], female    DOB: 1951/03/10, 63 y.o.   MRN: [id number]  HPI She is here to assess active health issues & conditions. PMH, FH, & Social history verified & updated    Blood pressure range / average :150-160/75-100 Compliant with anti hypertemsive medication. No lightheadedness or other adverse medication effect described.  A heart healthy /low salt diet is followed. 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      Review of Systems Recent headaches with spontaneous epistaxis in context of caring for mother who had terminal illness.Marland Kitchen Headaches respond to Excedrin, up to 4/day. No PMH of migraines. Occasional palpitations , mainly @ rest, not worse with exertion. Some DOE due to deconditioning.  Significant  chest pain,  claudication, paroxysmal nocturnal dyspnea, or edema absent.  She denies  hemoptysis, hematuria, melena, or rectal bleeding.has no unexplained weight loss, dysphagia, or abdominal pain. She has no abnormal bruising or bleeding.  She has no difficulty stopping bleeding with injury.      Objective:   Physical Exam Gen.: Healthy and well-nourished in appearance. Alert, appropriate and cooperative throughout exam.  Head: Normocephalic without obvious abnormalities  Eyes: No corneal or conjunctival inflammation noted. Pupils equal round reactive to light and accommodation. Extraocular motion intact. Fundal exam is benign without hemorrhages, exudate, papilledema.  Arteriolar narrowing Nose: External nasal exam reveals no deformity or inflammation. Nasal mucosa are pink and moist. No lesions or exudates noted.   Mouth: Oral mucosa and oropharynx reveal no lesions or exudates. Teeth in good repair. Neck: No deformities, masses, or tenderness noted. Range of motion & thyroid normal Lungs: Normal respiratory effort; chest expands symmetrically. Lungs are clear to auscultation without rales, wheezes, or increased work of  breathing. Heart: Normal rate and rhythm. Normal S1 and S2. No gallop, click, or rub. Grade 1/6 systolic murmur. Abdomen: Bowel sounds normal; abdomen soft and nontender. No masses, organomegaly or hernias noted. No AAA Genitalia: as per Gyn                                  Musculoskeletal/extremities: No deformity or scoliosis noted of  the thoracic or lumbar spine.  No clubbing, cyanosis, edema, or significant extremity  deformity noted. Range of motion normal .Tone & strength normal. Hand joints normal  Fingernail health good. Able to lie down & sit up w/o help. Negative SLR bilaterally Vascular: Carotid, radial artery, dorsalis pedis and  posterior tibial pulses are full and equal. No bruits present. Neurologic: Alert and oriented x3. Deep tendon reflexes symmetrical and normal.  Gait normal  Skin: Intact without suspicious lesions or rashes. Lymph: No cervical, axillary lymphadenopathy present. Psych: Mood and affect are normal. Normally interactive                                                                                        Assessment & Plan:  See Current Assessment & Plan in Problem List under specific DiagnosisThe labs will be reviewed and risks and options assessed. Written recommendations will be provided by mail or  directly through My Chart.Further evaluation or change in medical therapy will be directed by those results.

## 2013-09-06 NOTE — Assessment & Plan Note (Addendum)
Blood pressure goals reviewed. BMET Add Verapamil

## 2013-09-06 NOTE — Assessment & Plan Note (Signed)
CBC & dif 

## 2013-09-06 NOTE — Assessment & Plan Note (Signed)
Verapamil  Headache diary

## 2013-09-06 NOTE — Addendum Note (Signed)
Addended byHendricks Limes on: 09/06/2013 08:53 AM   Modules accepted: Orders

## 2013-09-06 NOTE — Patient Instructions (Signed)
Your next office appointment will be determined based upon review of your pending labs. Those instructions will be transmitted to you through My Chart   Followup as needed for your acute issue. Please report any significant change in your symptoms. Minimal Blood Pressure Goal= AVERAGE < 140/90;  Ideal is an AVERAGE < 135/85. This AVERAGE should be calculated from @ least 5-7 BP readings taken @ different times of day on different days of week. You should not respond to isolated BP readings , but rather the AVERAGE for that week .Please bring your  blood pressure cuff to office visits to verify that it is reliable.It  can also be checked against the blood pressure device at the pharmacy. Finger or wrist cuffs are not dependable; an arm cuff is.

## 2013-09-06 NOTE — Progress Notes (Signed)
Pre visit review using our clinic review tool, if applicable. No additional management support is needed unless otherwise documented below in the visit note. 

## 2013-09-07 ENCOUNTER — Ambulatory Visit: Payer: BC Managed Care – PPO

## 2013-09-07 ENCOUNTER — Telehealth: Payer: Self-pay

## 2013-09-07 DIAGNOSIS — R7309 Other abnormal glucose: Secondary | ICD-10-CM

## 2013-09-07 LAB — HEMOGLOBIN A1C: Hgb A1c MFr Bld: 6.3 % (ref 4.6–6.5)

## 2013-09-07 NOTE — Telephone Encounter (Signed)
Message copied by Shelly Coss on Thu Sep 07, 2013  8:58 AM ------      Message from: Hendricks Limes      Created: Wed Sep 06, 2013  7:07 PM       Please add A1c (790.29)       ------

## 2013-09-07 NOTE — Telephone Encounter (Signed)
Add on lab has been faxed

## 2013-09-20 ENCOUNTER — Encounter: Payer: Self-pay | Admitting: Internal Medicine

## 2013-10-18 DIAGNOSIS — N949 Unspecified condition associated with female genital organs and menstrual cycle: Secondary | ICD-10-CM | POA: Diagnosis not present

## 2013-10-18 DIAGNOSIS — N390 Urinary tract infection, site not specified: Secondary | ICD-10-CM | POA: Diagnosis not present

## 2013-10-18 DIAGNOSIS — N3010 Interstitial cystitis (chronic) without hematuria: Secondary | ICD-10-CM | POA: Diagnosis not present

## 2013-11-02 ENCOUNTER — Other Ambulatory Visit: Payer: Self-pay

## 2013-11-02 DIAGNOSIS — R51 Headache: Secondary | ICD-10-CM

## 2013-11-02 MED ORDER — VERAPAMIL HCL 80 MG PO TABS: ORAL_TABLET | ORAL | Status: DC

## 2013-11-20 ENCOUNTER — Other Ambulatory Visit: Payer: Self-pay

## 2013-11-20 DIAGNOSIS — I10 Essential (primary) hypertension: Secondary | ICD-10-CM

## 2013-11-20 MED ORDER — LISINOPRIL 20 MG PO TABS: ORAL_TABLET | ORAL | Status: DC

## 2013-12-07 DIAGNOSIS — B301 Conjunctivitis due to adenovirus: Secondary | ICD-10-CM | POA: Diagnosis not present

## 2014-02-20 DIAGNOSIS — Z23 Encounter for immunization: Secondary | ICD-10-CM | POA: Diagnosis not present

## 2014-03-07 ENCOUNTER — Ambulatory Visit (INDEPENDENT_AMBULATORY_CARE_PROVIDER_SITE_OTHER): Payer: Medicare Other | Admitting: Internal Medicine

## 2014-03-07 ENCOUNTER — Encounter: Payer: Self-pay | Admitting: Internal Medicine

## 2014-03-07 VITALS — BP 122/70 | HR 68 | Temp 98.2°F | Resp 12 | Wt 188.5 lb

## 2014-03-07 DIAGNOSIS — R0609 Other forms of dyspnea: Secondary | ICD-10-CM

## 2014-03-07 DIAGNOSIS — E785 Hyperlipidemia, unspecified: Secondary | ICD-10-CM | POA: Diagnosis not present

## 2014-03-07 DIAGNOSIS — R5383 Other fatigue: Secondary | ICD-10-CM | POA: Diagnosis not present

## 2014-03-07 DIAGNOSIS — Z23 Encounter for immunization: Secondary | ICD-10-CM | POA: Diagnosis not present

## 2014-03-07 DIAGNOSIS — I10 Essential (primary) hypertension: Secondary | ICD-10-CM

## 2014-03-07 MED ORDER — HYDROCHLOROTHIAZIDE 12.5 MG PO CAPS: 12.5000 mg | ORAL_CAPSULE | Freq: Every day | ORAL | Status: DC

## 2014-03-07 NOTE — Progress Notes (Signed)
   Subjective:    Patient ID: Heather [name], female    DOB: 08/24/1950, 63 y.o.   MRN: [id number]  HPI  Patient is here today for follow-up of hypertension.  She was last seen in April of 2015 at which time Verapamil was added.  Home blood pressure readings range 120-150/60-80.  She is compliant with medications.    She is following a modified vegan diet and does not add salt to her foods, although she does occasionally eat TV dinners. She is active with housework and yardwork, but has no formal exercise program.  She often finds that she has lack of motivation.   She has had increased fatigue and dyspnea on exertion.  She thinks her shortness of breath is improved if she misses evening dose of Verapamil.  She has been under increased stress this last year.  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  Both her sister and daughter in law have been diagnosed with breast cancer.   Review of Systems She has PND that is new since May.  She sits on the side of the bed or in the recliner and it resolves after a few minutes.  No orthopnea.    She denies chest pain, palpitations, LE edema.  No recent fevers, chills, sweats, weight loss or weight gain.      Objective:   Physical Exam        Assessment & Plan:

## 2014-03-07 NOTE — Patient Instructions (Signed)
Your next office appointment will be determined based upon review of your pending labs . Those instructions will be transmitted to you through My Chart  Followup as needed for your acute issue. Please report any significant change in your symptoms. Minimal Blood Pressure Goal= AVERAGE < 140/90;  Ideal is an AVERAGE < 135/85. This AVERAGE should be calculated from @ least 5-7 BP readings taken @ different times of day on different days of week. You should not respond to isolated BP readings , but rather the AVERAGE for that week .Please bring your  blood pressure cuff to office visits to verify that it is reliable.It  can also be checked against the blood pressure device at the pharmacy.

## 2014-03-07 NOTE — Progress Notes (Signed)
Pre visit review using our clinic review tool, if applicable. No additional management support is needed unless otherwise documented below in the visit note. 

## 2014-03-07 NOTE — Progress Notes (Signed)
   Subjective:    Patient ID: Heather [name], female    DOB: 1950/11/05, 63 y.o.   MRN: [id number]  HPI  She has been compliant with her blood pressure medicine although she feels the verapamil may increase shortness of breath.  Blood pressures range 120-150/68. She thinks  systolic average is 353-614.  She describes increasing fatigue and shortness of breath with exertion since May. This also been associated with some paroxysmal nocturnal dyspnea. This lasts a few minutes.  She is on modified vegen diet with sodium restriction. She does eat TV dinners.  She is active in her house and yard w/o regular CVE.  She's been under great stress. This week is anniversary of husband's death  Her mother died within the last year. Her sister and daughter-in-law been diagnosed with breast cancer.  She is considering counseling through [facility].      Review of Systems   There is no definite history of excessive snoring or sleep apnea. She states that she can wake herself up snoring.  She has no other cardiopulmonary symptoms.  Chest pain, palpitations, tachycardia, claudication or edema are absent.       Objective:   Physical Exam  Positive or pertinent findings include: There is a grade 2 systolic murmur at the base without radiation There is a mild varus deformity of the knees.  Appears healthy and well-nourished & in no acute distress No carotid bruits are present.No neck vein distention present at 10 - 15 degrees. Thyroid normal to palpation Heart rhythm and rate are normal with no gallop  Chest is clear with no increased work of breathing There is no evidence of aortic aneurysm or renal artery bruits Abdomen soft with no organomegaly or masses. No HJR No clubbing, cyanosis or edema present. Pedal pulses are intact  No ischemic skin changes are present . Fingernails healthy Alert and oriented. Strength, tone, DTRs reflexes normal          Assessment & Plan:  #1  exertional dyspnea  #2 fatigue  #3 paroxysmal nocturnal dyspnea  #4 hypertension  Assessment: EKG compared to 07/08/12 reveals slightly more prominent Q wave in lead 3 with a flat T-wave. These are nonspecific changes. She also may have minor early repolarization changes.  Plan: See orders and recommendations

## 2014-03-08 ENCOUNTER — Telehealth: Payer: Self-pay | Admitting: Internal Medicine

## 2014-03-08 NOTE — Telephone Encounter (Signed)
emmi emailed °

## 2014-03-15 ENCOUNTER — Other Ambulatory Visit (INDEPENDENT_AMBULATORY_CARE_PROVIDER_SITE_OTHER): Payer: Medicare Other

## 2014-03-15 ENCOUNTER — Other Ambulatory Visit: Payer: Self-pay | Admitting: Internal Medicine

## 2014-03-15 DIAGNOSIS — I10 Essential (primary) hypertension: Secondary | ICD-10-CM | POA: Diagnosis not present

## 2014-03-15 DIAGNOSIS — E785 Hyperlipidemia, unspecified: Secondary | ICD-10-CM | POA: Diagnosis not present

## 2014-03-15 DIAGNOSIS — R5383 Other fatigue: Secondary | ICD-10-CM | POA: Diagnosis not present

## 2014-03-15 LAB — CBC WITH DIFFERENTIAL/PLATELET
BASOS ABS: 0 10*3/uL (ref 0.0–0.1)
BASOS PCT: 0.5 % (ref 0.0–3.0)
EOS ABS: 0.3 10*3/uL (ref 0.0–0.7)
Eosinophils Relative: 3.4 % (ref 0.0–5.0)
HCT: 41.3 % (ref 36.0–46.0)
Hemoglobin: 13.5 g/dL (ref 12.0–15.0)
LYMPHS PCT: 40.5 % (ref 12.0–46.0)
Lymphs Abs: 3.1 10*3/uL (ref 0.7–4.0)
MCHC: 32.6 g/dL (ref 30.0–36.0)
MCV: 73.8 fl — AB (ref 78.0–100.0)
MONO ABS: 0.8 10*3/uL (ref 0.1–1.0)
Monocytes Relative: 10.4 % (ref 3.0–12.0)
NEUTROS ABS: 3.5 10*3/uL (ref 1.4–7.7)
NEUTROS PCT: 45.2 % (ref 43.0–77.0)
Platelets: 389 10*3/uL (ref 150.0–400.0)
RBC: 5.6 Mil/uL — ABNORMAL HIGH (ref 3.87–5.11)
RDW: 16 % — AB (ref 11.5–15.5)
WBC: 7.7 10*3/uL (ref 4.0–10.5)

## 2014-03-15 LAB — BASIC METABOLIC PANEL
BUN: 16 mg/dL (ref 6–23)
CALCIUM: 9.6 mg/dL (ref 8.4–10.5)
CHLORIDE: 98 meq/L (ref 96–112)
CO2: 21 meq/L (ref 19–32)
CREATININE: 0.8 mg/dL (ref 0.4–1.2)
GFR: 75.32 mL/min (ref >60.00)
GLUCOSE: 126 mg/dL — AB (ref 70–99)
Potassium: 4.2 mEq/L (ref 3.5–5.1)
Sodium: 133 mEq/L — ABNORMAL LOW (ref 135–145)

## 2014-03-15 LAB — HEPATIC FUNCTION PANEL
ALBUMIN: 3.7 g/dL (ref 3.5–5.2)
ALK PHOS: 58 U/L (ref 39–117)
ALT: 14 U/L (ref 0–35)
AST: 19 U/L (ref 0–37)
BILIRUBIN DIRECT: 0.1 mg/dL (ref 0.0–0.3)
TOTAL PROTEIN: 7.5 g/dL (ref 6.0–8.3)
Total Bilirubin: 0.8 mg/dL (ref 0.2–1.2)

## 2014-03-15 LAB — TSH: TSH: 3.25 u[IU]/mL (ref 0.35–4.50)

## 2014-03-17 ENCOUNTER — Other Ambulatory Visit: Payer: Self-pay | Admitting: Internal Medicine

## 2014-03-17 LAB — NMR LIPOPROFILE WITH LIPIDS
Cholesterol, Total: 333 mg/dL — ABNORMAL HIGH (ref 100–199)
HDL PARTICLE NUMBER: 42.4 umol/L (ref >=30.5)
HDL Size: 8.7 nm — ABNORMAL LOW (ref >=9.2)
HDL-C: 63 mg/dL (ref >39)
LDL CALC: 217 mg/dL — AB (ref 0–99)
LDL PARTICLE NUMBER: 2970 nmol/L — AB (ref <1000)
LDL SIZE: 20 nm (ref >=20.8)
LP-IR SCORE: 74 — AB (ref <=45)
Large HDL-P: 5 umol/L (ref >=4.8)
Large VLDL-P: 10.9 nmol/L — ABNORMAL HIGH (ref <=2.7)
SMALL LDL PARTICLE NUMBER: 1981 nmol/L — AB (ref <=527)
Triglycerides: 263 mg/dL — ABNORMAL HIGH (ref 0–149)
VLDL SIZE: 49.5 nm — AB (ref <=46.6)

## 2014-03-18 ENCOUNTER — Encounter: Payer: Self-pay | Admitting: Internal Medicine

## 2014-03-18 DIAGNOSIS — E782 Mixed hyperlipidemia: Secondary | ICD-10-CM | POA: Insufficient documentation

## 2014-03-19 ENCOUNTER — Other Ambulatory Visit (INDEPENDENT_AMBULATORY_CARE_PROVIDER_SITE_OTHER): Payer: Medicare Other

## 2014-03-19 ENCOUNTER — Telehealth: Payer: Self-pay

## 2014-03-19 DIAGNOSIS — R739 Hyperglycemia, unspecified: Secondary | ICD-10-CM

## 2014-03-19 LAB — HEMOGLOBIN A1C: HEMOGLOBIN A1C: 7 % — AB (ref 4.6–6.5)

## 2014-03-19 NOTE — Telephone Encounter (Signed)
Request for add on has been faxed to lab 

## 2014-03-19 NOTE — Telephone Encounter (Signed)
-----   Message from Hendricks Limes, MD sent at 03/18/2014  1:07 PM EST ----- Please add A1c (R73.9); thanks

## 2014-04-26 DIAGNOSIS — N3010 Interstitial cystitis (chronic) without hematuria: Secondary | ICD-10-CM | POA: Diagnosis not present

## 2014-04-26 DIAGNOSIS — R102 Pelvic and perineal pain: Secondary | ICD-10-CM | POA: Diagnosis not present

## 2014-04-27 ENCOUNTER — Encounter: Payer: Self-pay | Admitting: Internal Medicine

## 2014-05-02 ENCOUNTER — Ambulatory Visit (INDEPENDENT_AMBULATORY_CARE_PROVIDER_SITE_OTHER): Payer: Medicare Other | Admitting: Internal Medicine

## 2014-05-02 ENCOUNTER — Ambulatory Visit: Payer: Self-pay | Admitting: Internal Medicine

## 2014-05-02 ENCOUNTER — Encounter: Payer: Self-pay | Admitting: Internal Medicine

## 2014-05-02 VITALS — BP 122/90 | HR 104 | Temp 98.1°F | Wt 186.0 lb

## 2014-05-02 DIAGNOSIS — I10 Essential (primary) hypertension: Secondary | ICD-10-CM | POA: Diagnosis not present

## 2014-05-02 DIAGNOSIS — E119 Type 2 diabetes mellitus without complications: Secondary | ICD-10-CM

## 2014-05-02 DIAGNOSIS — E785 Hyperlipidemia, unspecified: Secondary | ICD-10-CM | POA: Diagnosis not present

## 2014-05-02 MED ORDER — CVS BLOOD GLUCOSE METER W/DEVICE KIT: PACK | Status: DC

## 2014-05-02 MED ORDER — GLUCOSE BLOOD VI STRP: ORAL_STRIP | Status: DC

## 2014-05-02 MED ORDER — METFORMIN HCL 500 MG PO TABS: 500.0000 mg | ORAL_TABLET | Freq: Two times a day (BID) | ORAL | Status: DC

## 2014-05-02 MED ORDER — LISINOPRIL 20 MG PO TABS: ORAL_TABLET | ORAL | Status: DC

## 2014-05-02 MED ORDER — HYDROCHLOROTHIAZIDE 12.5 MG PO CAPS
12.5000 mg | ORAL_CAPSULE | Freq: Every day | ORAL | Status: DC
Start: 2014-05-02 — End: 2014-12-21

## 2014-05-02 MED ORDER — CVS LANCETS THIN MISC: Status: DC

## 2014-05-02 NOTE — Progress Notes (Signed)
   Subjective:    Patient ID: Heather [name], female    DOB: 1950-07-01, 63 y.o.   MRN: [id number]  HPI   She has started a lower sugar, lower carb diet as she's been having hypoglycemic attacks  Her A1c was 7.  Her last cholesterol testing documents that her LDL should be less than 100, ideally less than 70. Her most recent NMR LipoProfile revealed LDL of 217with 2970 total particles. Triglycerides had decreased but only to 263. LP-IR was 69 Her mother had heart attack and stroke at 70. Paternal grandfather had heart attack at 21  She is planning to start exercising regularly.  Review of Systems   The hypoglycemic spells are manifested as her being shaky and sweaty.  She is anxious to get a glucometer so she can monitor her diabetes.     Objective:   Physical Exam  Positive or pertinent findings include: She has a slightly rough grade 1 systolic murmur at the base.  General appearance :adequately nourished; in no distress. Eyes: No conjunctival inflammation or scleral icterus is present. Oral exam: Dental hygiene is good. Lips and gums are healthy appearing.There is no oropharyngeal erythema or exudate noted.  Heart:  Normal rate and regular rhythm. S1 and S2 normal without gallop, click, rub or other extra sounds   Lungs:Chest clear to auscultation; no wheezes, rhonchi,rales ,or rubs present.No increased work of breathing.  Abdomen: bowel sounds normal, soft and non-tender without masses, organomegaly or hernias noted.  No guarding or rebound.  Vascular : all pulses equal ; no bruits present. Skin:Warm & dry.  Intact without suspicious lesions or rashes ; no jaundice or tenting Lymphatic: No lymphadenopathy is noted about the head, neck, axilla          Assessment & Plan:  #1 new-onset adult diabetes with adequate control  #2 dyslipidemia  Plan: The pathophysiology of plaque formation and associated risks were discussed. Nutritional Changes were  recommended. Metformin initiated

## 2014-05-02 NOTE — Progress Notes (Signed)
Pre visit review using our clinic review tool, if applicable. No additional management support is needed unless otherwise documented below in the visit note. 

## 2014-05-02 NOTE — Patient Instructions (Addendum)
Monitor the glucose before eating  (fasting blood sugar) Monday, Wednesday, Friday, and Sunday. This should range from 100-150. Check glucose 2 hours after  breakfast on Tuesday; 2 hours after lunch on Thursday; and 2 hours after the meal on Saturday. This value should average  less than 180, ideally less than 160.   The following nutritional changes may help prevent Diabetes progression & complications.  White carbohydrates (potatoes, rice, bread, and pasta) cause a high spike of the sugar level which stays elevated for a significant period of time (called sugar"load").  For example a  baked potato has a cup of sugar and a  french fry  2 teaspoons of sugar.  More complex carbs such as yams, wild  rice, whole grained bread &  wheat pasta have been much lower spike and persistent load of sugar than the white carbs. The pancreas excretes excess insulin in response to the high spike & load of sugar . Over time the pancreas can actually run out of insulin necessitating insulin shots.  Please follow a Mediaterranean type diet  (many good cook books readily available) or review Dr Nunzio Cory book Eat, [place] for best  dietary cholesterol information & options.  Cardiovascular exercise, this can be as simple a program as walking, is recommended 30-45 minutes 3-4 times per week. If you're not exercising you should take 6-8 weeks to build up to this level.  Repeat A1c & lipids after 4 months of nutritional & exercise changes is best way to optimally assess long LDL risk.

## 2014-05-03 ENCOUNTER — Other Ambulatory Visit: Payer: Self-pay

## 2014-05-03 DIAGNOSIS — E1140 Type 2 diabetes mellitus with diabetic neuropathy, unspecified: Secondary | ICD-10-CM | POA: Insufficient documentation

## 2014-05-03 DIAGNOSIS — E119 Type 2 diabetes mellitus without complications: Secondary | ICD-10-CM | POA: Insufficient documentation

## 2014-05-03 MED ORDER — CVS LANCETS THIN MISC: Status: DC

## 2014-05-03 MED ORDER — GLUCOSE BLOOD VI STRP: ORAL_STRIP | Status: DC

## 2014-05-03 MED ORDER — CVS BLOOD GLUCOSE METER W/DEVICE KIT: PACK | Status: DC

## 2014-05-03 NOTE — Telephone Encounter (Signed)
Fax from [facility] stating scripts for diabetic supplies need to be faxed (non electronic). With ICD code

## 2014-05-09 ENCOUNTER — Telehealth: Payer: Self-pay | Admitting: Internal Medicine

## 2014-05-09 MED ORDER — METFORMIN HCL ER 500 MG PO TB24: 500.0000 mg | ORAL_TABLET | Freq: Every day | ORAL | Status: DC

## 2014-05-09 NOTE — Telephone Encounter (Signed)
Headaches, nausea, diarrhea are symptoms pt has had she believes was from Metformin. Pt stopped taking last pm and feels better. Pt is requesting extended relief Metformin XL or extended relief.

## 2014-05-09 NOTE — Telephone Encounter (Signed)
Patient has been advised. Prescription has been sent to [facility]

## 2014-05-09 NOTE — Telephone Encounter (Signed)
ER form 500 mg qd after largest meal #30

## 2014-06-10 ENCOUNTER — Other Ambulatory Visit: Payer: Self-pay | Admitting: Internal Medicine

## 2014-06-12 ENCOUNTER — Telehealth: Payer: Self-pay

## 2014-06-12 NOTE — Telephone Encounter (Signed)
05/02/14 Office note has been faxed back to [facility].

## 2014-06-28 DIAGNOSIS — H2513 Age-related nuclear cataract, bilateral: Secondary | ICD-10-CM | POA: Diagnosis not present

## 2014-06-28 DIAGNOSIS — H25043 Posterior subcapsular polar age-related cataract, bilateral: Secondary | ICD-10-CM | POA: Diagnosis not present

## 2014-06-28 DIAGNOSIS — E119 Type 2 diabetes mellitus without complications: Secondary | ICD-10-CM | POA: Diagnosis not present

## 2014-06-28 LAB — HM DIABETES EYE EXAM

## 2014-06-30 ENCOUNTER — Other Ambulatory Visit: Payer: Self-pay | Admitting: Internal Medicine

## 2014-07-24 ENCOUNTER — Telehealth: Payer: Self-pay | Admitting: Internal Medicine

## 2014-07-24 NOTE — Telephone Encounter (Signed)
I reviewed her med list ; none should cause joint pain I recommend office visit with labs & imaging as indicated by history & exam

## 2014-07-24 NOTE — Telephone Encounter (Signed)
Patient has been advised

## 2014-07-24 NOTE — Telephone Encounter (Signed)
Pt called in said that joints are hurting and feeling tired.  She feels like the meds are making her feel bad and her joints hurting.  Would like for nurse to give her a call back.

## 2014-08-01 ENCOUNTER — Other Ambulatory Visit (INDEPENDENT_AMBULATORY_CARE_PROVIDER_SITE_OTHER): Payer: Medicare Other

## 2014-08-01 ENCOUNTER — Encounter: Payer: Self-pay | Admitting: Internal Medicine

## 2014-08-01 ENCOUNTER — Ambulatory Visit (INDEPENDENT_AMBULATORY_CARE_PROVIDER_SITE_OTHER): Payer: Medicare Other | Admitting: Internal Medicine

## 2014-08-01 VITALS — BP 116/70 | HR 71 | Temp 98.3°F | Resp 14 | Ht 62.0 in | Wt 191.5 lb

## 2014-08-01 DIAGNOSIS — E785 Hyperlipidemia, unspecified: Secondary | ICD-10-CM

## 2014-08-01 DIAGNOSIS — M791 Myalgia: Secondary | ICD-10-CM

## 2014-08-01 DIAGNOSIS — E119 Type 2 diabetes mellitus without complications: Secondary | ICD-10-CM

## 2014-08-01 DIAGNOSIS — R5383 Other fatigue: Secondary | ICD-10-CM

## 2014-08-01 DIAGNOSIS — M609 Myositis, unspecified: Secondary | ICD-10-CM

## 2014-08-01 DIAGNOSIS — IMO0001 Reserved for inherently not codable concepts without codable children: Secondary | ICD-10-CM

## 2014-08-01 LAB — HEPATIC FUNCTION PANEL
ALK PHOS: 53 U/L (ref 39–117)
ALT: 14 U/L (ref 0–35)
AST: 14 U/L (ref 0–37)
Albumin: 4.3 g/dL (ref 3.5–5.2)
Bilirubin, Direct: 0 mg/dL (ref 0.0–0.3)
TOTAL PROTEIN: 7.1 g/dL (ref 6.0–8.3)
Total Bilirubin: 0.6 mg/dL (ref 0.2–1.2)

## 2014-08-01 LAB — BASIC METABOLIC PANEL
BUN: 14 mg/dL (ref 6–23)
CHLORIDE: 97 meq/L (ref 96–112)
CO2: 27 mEq/L (ref 19–32)
Calcium: 9.6 mg/dL (ref 8.4–10.5)
Creatinine, Ser: 0.81 mg/dL (ref 0.40–1.20)
GFR: 75.23 mL/min (ref >60.00)
Glucose, Bld: 119 mg/dL — ABNORMAL HIGH (ref 70–99)
POTASSIUM: 5 meq/L (ref 3.5–5.1)
Sodium: 132 mEq/L — ABNORMAL LOW (ref 135–145)

## 2014-08-01 LAB — LDL CHOLESTEROL, DIRECT: Direct LDL: 174 mg/dL

## 2014-08-01 LAB — CBC WITH DIFFERENTIAL/PLATELET
Basophils Absolute: 0 10*3/uL (ref 0.0–0.1)
Basophils Relative: 0.3 % (ref 0.0–3.0)
EOS ABS: 0.2 10*3/uL (ref 0.0–0.7)
Eosinophils Relative: 2.5 % (ref 0.0–5.0)
HEMATOCRIT: 36.4 % (ref 36.0–46.0)
Hemoglobin: 12.3 g/dL (ref 12.0–15.0)
LYMPHS ABS: 1.8 10*3/uL (ref 0.7–4.0)
Lymphocytes Relative: 26.1 % (ref 12.0–46.0)
MCHC: 33.8 g/dL (ref 30.0–36.0)
MCV: 75.3 fl — AB (ref 78.0–100.0)
Monocytes Absolute: 0.6 10*3/uL (ref 0.1–1.0)
Monocytes Relative: 8.2 % (ref 3.0–12.0)
NEUTROS PCT: 62.9 % (ref 43.0–77.0)
Neutro Abs: 4.3 10*3/uL (ref 1.4–7.7)
Platelets: 400 10*3/uL (ref 150.0–400.0)
RBC: 4.84 Mil/uL (ref 3.87–5.11)
RDW: 15.1 % (ref 11.5–15.5)
WBC: 6.8 10*3/uL (ref 4.0–10.5)

## 2014-08-01 LAB — LIPID PANEL
CHOL/HDL RATIO: 4
Cholesterol: 271 mg/dL — ABNORMAL HIGH (ref 0–200)
HDL: 62.3 mg/dL (ref >39.00)
NonHDL: 208.7
Triglycerides: 238 mg/dL — ABNORMAL HIGH (ref 0.0–149.0)
VLDL: 47.6 mg/dL — ABNORMAL HIGH (ref 0.0–40.0)

## 2014-08-01 LAB — HEMOGLOBIN A1C: Hgb A1c MFr Bld: 7 % — ABNORMAL HIGH (ref 4.6–6.5)

## 2014-08-01 LAB — MICROALBUMIN / CREATININE URINE RATIO
CREATININE, U: 102.1 mg/dL
Microalb Creat Ratio: 0.7 mg/g (ref 0.0–30.0)
Microalb, Ur: <0.7 mg/dL (ref 0.0–1.9)

## 2014-08-01 LAB — TSH: TSH: 1.14 u[IU]/mL (ref 0.35–4.50)

## 2014-08-01 LAB — CK: CK TOTAL: 68 U/L (ref 7–177)

## 2014-08-01 MED ORDER — AMOXICILLIN 500 MG PO CAPS: 500.0000 mg | ORAL_CAPSULE | Freq: Three times a day (TID) | ORAL | Status: DC

## 2014-08-01 NOTE — Patient Instructions (Addendum)
  Your next office appointment will be determined based upon review of your pending labs   Those instructions will be transmitted to you through My Chart   Critical values will be called.  Followup as needed for any active or acute issue. Please report any significant change in your symptoms. 

## 2014-08-01 NOTE — Progress Notes (Signed)
Pre visit review using our clinic review tool, if applicable. No additional management support is needed unless otherwise documented below in the visit note. 

## 2014-08-01 NOTE — Progress Notes (Signed)
   Subjective:    Patient ID: Heather [name], female    DOB: 1951/02/04, 64 y.o.   MRN: [id number]  HPI  She returns for follow-up of her diabetes. Glucophage extended release has improved her GI symptoms of stool urgency and loose-watery stools . She's only on  500 mg after breakfast.  Her blood sugars  range 80-196. She continues to have polyphagia; this is associated with feeling shaky and hungry if her blood sugars are 90 or below.  She has had significant fatigue.  She has changed her diet and avoids sodas. She is drinking diet tea & water only. She is totally excluding high fructose corn syrup sugar from her diet.LDL was 217 & TG 263 in 10/15. TSH was therapeutic.  She is in a [employer] exercise program 15 minutes on treadmill 3 times a week and she also walks  4 times a week w/o cardiopulmonary symptoms.   She's also had some arthralgias & myalgias;but this improved when she started drinking tea after avoiding it for a period.Marland Kitchen  She was struck by a piece of furniture across the right forehead accidentally. There was no loss of consciousness. She's had residual ecchymosis.    Review of Systems  Positive symptoms include some numbness and tingling occasionally in her feet. She has some restless leg syndromes symptoms particularly in the evening.  She is reducing the medication she takes for her interstitial cystitis as it is resolved or quiescent. She is now on citalopram 10 mg (one fourth of a 40 mg pill) daily.  Unexplained weight loss, abdominal pain, significant dyspepsia, dysphagia, melena, rectal bleeding, or persistently small caliber stools are denied.  Chest pain, palpitations, tachycardia, exertional dyspnea, paroxysmal nocturnal dyspnea, claudication or edema are absent.  Her skin is dry but she has no hair or nail changes.  She denies blurred vision, double vision, loss of vision. She has had a recent diabetic eye exam which was negative for retinopathy.  She  denies any postural hypotension symptoms.  She has no polyuria, or polydipsia.  There are no nonhealing skin lesions.      Objective:   Physical Exam   Pertinent or positive findings include: There is ecchymosis at the lateral aspect of the right eye. She has has slightly rough grade 1 systolic murmur at the base. She has mild crepitus of the knees.  Gen.:  Adequately nourished; in no acute distress Eyes: Extraocular motion intact; no lid lag , proptosis , or nystagmus.FOV WNL. Neck: full ROM; no masses ; thyroid normal  Heart: Normal rhythm and rate without significant gallop or extra heart sounds Lungs: Chest clear to auscultation without rales,rales, wheezes Neuro:Deep tendon reflexes are equal and within normal limits; no tremor  Skin/Nails: Warm and dry without significant lesions or rashes; no onycholysis Lymphatic: no cervical or axillary LA Psych: Normally communicative and interactive; no abnormal mood or affect clinically.          Assessment & Plan:   #1 diabetes, questionable status  #2 fatigue  #3 myalgias  #4 recent head trauma without sequela  Plan: See orders recommendations

## 2014-08-04 ENCOUNTER — Encounter: Payer: Self-pay | Admitting: Internal Medicine

## 2014-08-06 ENCOUNTER — Other Ambulatory Visit: Payer: Self-pay | Admitting: *Deleted

## 2014-08-06 MED ORDER — METFORMIN HCL ER 500 MG PO TB24: 500.0000 mg | ORAL_TABLET | Freq: Two times a day (BID) | ORAL | Status: DC

## 2014-08-06 NOTE — Telephone Encounter (Signed)
Sent email agreed to try metformin MD stated to send in #[phone]...Heather [name]

## 2014-08-28 DIAGNOSIS — R300 Dysuria: Secondary | ICD-10-CM | POA: Diagnosis not present

## 2014-08-28 DIAGNOSIS — R350 Frequency of micturition: Secondary | ICD-10-CM | POA: Diagnosis not present

## 2014-08-28 DIAGNOSIS — R312 Other microscopic hematuria: Secondary | ICD-10-CM | POA: Diagnosis not present

## 2014-09-03 DIAGNOSIS — M62838 Other muscle spasm: Secondary | ICD-10-CM | POA: Diagnosis not present

## 2014-09-03 DIAGNOSIS — R102 Pelvic and perineal pain: Secondary | ICD-10-CM | POA: Diagnosis not present

## 2014-09-03 DIAGNOSIS — R350 Frequency of micturition: Secondary | ICD-10-CM | POA: Diagnosis not present

## 2014-09-03 DIAGNOSIS — R278 Other lack of coordination: Secondary | ICD-10-CM | POA: Diagnosis not present

## 2014-09-11 DIAGNOSIS — R278 Other lack of coordination: Secondary | ICD-10-CM | POA: Diagnosis not present

## 2014-09-11 DIAGNOSIS — M62838 Other muscle spasm: Secondary | ICD-10-CM | POA: Diagnosis not present

## 2014-09-11 DIAGNOSIS — N390 Urinary tract infection, site not specified: Secondary | ICD-10-CM | POA: Diagnosis not present

## 2014-09-11 DIAGNOSIS — R102 Pelvic and perineal pain: Secondary | ICD-10-CM | POA: Diagnosis not present

## 2014-09-17 DIAGNOSIS — R350 Frequency of micturition: Secondary | ICD-10-CM | POA: Diagnosis not present

## 2014-09-17 DIAGNOSIS — N3010 Interstitial cystitis (chronic) without hematuria: Secondary | ICD-10-CM | POA: Diagnosis not present

## 2014-09-17 DIAGNOSIS — R278 Other lack of coordination: Secondary | ICD-10-CM | POA: Diagnosis not present

## 2014-09-17 DIAGNOSIS — M62838 Other muscle spasm: Secondary | ICD-10-CM | POA: Diagnosis not present

## 2014-09-24 DIAGNOSIS — R350 Frequency of micturition: Secondary | ICD-10-CM | POA: Diagnosis not present

## 2014-09-24 DIAGNOSIS — N3010 Interstitial cystitis (chronic) without hematuria: Secondary | ICD-10-CM | POA: Diagnosis not present

## 2014-09-24 DIAGNOSIS — M62838 Other muscle spasm: Secondary | ICD-10-CM | POA: Diagnosis not present

## 2014-09-24 DIAGNOSIS — R278 Other lack of coordination: Secondary | ICD-10-CM | POA: Diagnosis not present

## 2014-09-28 DIAGNOSIS — Z803 Family history of malignant neoplasm of breast: Secondary | ICD-10-CM | POA: Diagnosis not present

## 2014-09-28 DIAGNOSIS — Z1231 Encounter for screening mammogram for malignant neoplasm of breast: Secondary | ICD-10-CM | POA: Diagnosis not present

## 2014-10-01 DIAGNOSIS — R102 Pelvic and perineal pain: Secondary | ICD-10-CM | POA: Diagnosis not present

## 2014-10-01 DIAGNOSIS — N390 Urinary tract infection, site not specified: Secondary | ICD-10-CM | POA: Diagnosis not present

## 2014-10-01 DIAGNOSIS — M62838 Other muscle spasm: Secondary | ICD-10-CM | POA: Diagnosis not present

## 2014-10-01 DIAGNOSIS — R278 Other lack of coordination: Secondary | ICD-10-CM | POA: Diagnosis not present

## 2014-10-19 ENCOUNTER — Encounter: Payer: Self-pay | Admitting: Internal Medicine

## 2014-11-02 ENCOUNTER — Other Ambulatory Visit: Payer: Self-pay | Admitting: Internal Medicine

## 2014-11-12 ENCOUNTER — Other Ambulatory Visit: Payer: Self-pay

## 2014-11-28 DIAGNOSIS — N3010 Interstitial cystitis (chronic) without hematuria: Secondary | ICD-10-CM | POA: Diagnosis not present

## 2014-11-28 DIAGNOSIS — R278 Other lack of coordination: Secondary | ICD-10-CM | POA: Diagnosis not present

## 2014-11-28 DIAGNOSIS — R350 Frequency of micturition: Secondary | ICD-10-CM | POA: Diagnosis not present

## 2014-11-30 ENCOUNTER — Ambulatory Visit (INDEPENDENT_AMBULATORY_CARE_PROVIDER_SITE_OTHER): Payer: Medicare Other | Admitting: Internal Medicine

## 2014-11-30 ENCOUNTER — Other Ambulatory Visit (INDEPENDENT_AMBULATORY_CARE_PROVIDER_SITE_OTHER): Payer: Medicare Other

## 2014-11-30 VITALS — BP 110/64 | HR 107 | Ht 62.0 in | Wt 182.1 lb

## 2014-11-30 DIAGNOSIS — R002 Palpitations: Secondary | ICD-10-CM

## 2014-11-30 DIAGNOSIS — M791 Myalgia: Secondary | ICD-10-CM

## 2014-11-30 DIAGNOSIS — E782 Mixed hyperlipidemia: Secondary | ICD-10-CM

## 2014-11-30 DIAGNOSIS — I10 Essential (primary) hypertension: Secondary | ICD-10-CM

## 2014-11-30 DIAGNOSIS — R0600 Dyspnea, unspecified: Secondary | ICD-10-CM | POA: Diagnosis not present

## 2014-11-30 DIAGNOSIS — IMO0001 Reserved for inherently not codable concepts without codable children: Secondary | ICD-10-CM

## 2014-11-30 DIAGNOSIS — E119 Type 2 diabetes mellitus without complications: Secondary | ICD-10-CM | POA: Diagnosis not present

## 2014-11-30 DIAGNOSIS — R5383 Other fatigue: Secondary | ICD-10-CM

## 2014-11-30 DIAGNOSIS — M609 Myositis, unspecified: Secondary | ICD-10-CM

## 2014-11-30 LAB — CBC WITH DIFFERENTIAL/PLATELET
BASOS ABS: 0 10*3/uL (ref 0.0–0.1)
BASOS PCT: 0.2 % (ref 0.0–3.0)
EOS ABS: 0.1 10*3/uL (ref 0.0–0.7)
Eosinophils Relative: 1.7 % (ref 0.0–5.0)
HEMATOCRIT: 38 % (ref 36.0–46.0)
HEMOGLOBIN: 12.7 g/dL (ref 12.0–15.0)
LYMPHS PCT: 26.7 % (ref 12.0–46.0)
Lymphs Abs: 2 10*3/uL (ref 0.7–4.0)
MCHC: 33.5 g/dL (ref 30.0–36.0)
MCV: 75.3 fl — AB (ref 78.0–100.0)
Monocytes Absolute: 0.7 10*3/uL (ref 0.1–1.0)
Monocytes Relative: 8.7 % (ref 3.0–12.0)
NEUTROS ABS: 4.7 10*3/uL (ref 1.4–7.7)
Neutrophils Relative %: 62.7 % (ref 43.0–77.0)
Platelets: 411 10*3/uL — ABNORMAL HIGH (ref 150.0–400.0)
RBC: 5.04 Mil/uL (ref 3.87–5.11)
RDW: 14 % (ref 11.5–15.5)
WBC: 7.5 10*3/uL (ref 4.0–10.5)

## 2014-11-30 LAB — BASIC METABOLIC PANEL
BUN: 19 mg/dL (ref 6–23)
CO2: 29 meq/L (ref 19–32)
CREATININE: 0.89 mg/dL (ref 0.40–1.20)
Calcium: 10.8 mg/dL — ABNORMAL HIGH (ref 8.4–10.5)
Chloride: 99 mEq/L (ref 96–112)
GFR: 67.41 mL/min (ref >60.00)
GLUCOSE: 96 mg/dL (ref 70–99)
POTASSIUM: 4.1 meq/L (ref 3.5–5.1)
Sodium: 135 mEq/L (ref 135–145)

## 2014-11-30 LAB — HEPATIC FUNCTION PANEL
ALT: 11 U/L (ref 0–35)
AST: 12 U/L (ref 0–37)
Albumin: 4.4 g/dL (ref 3.5–5.2)
Alkaline Phosphatase: 54 U/L (ref 39–117)
BILIRUBIN DIRECT: 0.1 mg/dL (ref 0.0–0.3)
Total Bilirubin: 0.5 mg/dL (ref 0.2–1.2)
Total Protein: 7.4 g/dL (ref 6.0–8.3)

## 2014-11-30 LAB — HEMOGLOBIN A1C: HEMOGLOBIN A1C: 6.8 % — AB (ref 4.6–6.5)

## 2014-11-30 LAB — MAGNESIUM: Magnesium: 1.7 mg/dL (ref 1.5–2.5)

## 2014-11-30 LAB — LIPID PANEL
CHOL/HDL RATIO: 5
Cholesterol: 290 mg/dL — ABNORMAL HIGH (ref 0–200)
HDL: 56.3 mg/dL (ref >39.00)
Triglycerides: 568 mg/dL — ABNORMAL HIGH (ref 0.0–149.0)

## 2014-11-30 LAB — LDL CHOLESTEROL, DIRECT: LDL DIRECT: 171 mg/dL

## 2014-11-30 LAB — MICROALBUMIN / CREATININE URINE RATIO
CREATININE, U: 160.9 mg/dL
Microalb Creat Ratio: 0.5 mg/g (ref 0.0–30.0)
Microalb, Ur: 0.8 mg/dL (ref 0.0–1.9)

## 2014-11-30 LAB — VITAMIN D 25 HYDROXY (VIT D DEFICIENCY, FRACTURES): VITD: 27.97 ng/mL — ABNORMAL LOW (ref 30.00–100.00)

## 2014-11-30 LAB — TSH: TSH: 0.75 u[IU]/mL (ref 0.35–4.50)

## 2014-11-30 NOTE — Progress Notes (Signed)
   Subjective:    Patient ID: Heather [name], female    DOB: 1950-09-23, 64 y.o.   MRN: [id number]  HPI The patient is here to assess status of active health conditions.  PMH, FH, & Social History reviewed & updated.  She is on a heart healthy, low-salt diet. She's very physically active. Blood pressures are well controlled with a range 120-138/70+. Fasting blood sugars are in the 150 range.  She describes fatigue particularly in the afternoon associated with dyspnea. This can occur at rest but is definitely is worse with exertion. She has been heat intolerant.  She's noted dryness to her skin.  She has nocturnal muscle cramps as well as cramps in the fingers. She has palpitations intermittently as well.  She states that she plans to have no more colonoscopies.  Dr. Gaynelle Arabian treats her interstitial cystitis. She has been treated with physical therapy interventions and as well as hydration.  Review of Systems  Chest pain,  tachycardia, paroxysmal nocturnal dyspnea, claudication or edema are absent. No unexplained weight loss, abdominal pain, significant dyspepsia, dysphagia, melena, rectal bleeding, or persistently small caliber stools. Dysuria, pyuria, hematuria, or polyuria are denied. Change in hair, skin, nails denied. No bowel changes of constipation or diarrhea. No intolerance to cold.     Objective:   Physical Exam  Pertinent or positive findings include: Grade 1 systolic murmur is noted brace. Abdomen is protuberant. She has hammertoe deformities.  General appearance :adequately nourished; in no distress.BMI: 33.3  Eyes: No conjunctival inflammation or scleral icterus is present.  Oral exam:  Lips and gums are healthy appearing.There is no oropharyngeal erythema or exudate noted. Dental hygiene is good.  Heart:  Normal rate and regular rhythm. S1 and S2 normal without gallop, click, rub or other extra sounds    Lungs:Chest clear to auscultation; no wheezes,  rhonchi,rales ,or rubs present.No increased work of breathing.   Abdomen: bowel sounds normal, soft and non-tender without masses, organomegaly or hernias noted.  No guarding or rebound.   Vascular : all pulses equal ; no bruits present.  Skin:Warm & dry.  Intact without suspicious lesions or rashes ; no tenting or jaundice   Lymphatic: No lymphadenopathy is noted about the head, neck, axilla   Neuro: Strength, tone & DTRs normal.         Assessment & Plan:  #1 dyspnea  #2 fatigue  #3 palpitations . EKG WNL. #4 diabetes, questionable status  #4 hypertension, controlled  #5 mixed hyperlipidemia   See orders

## 2014-11-30 NOTE — Patient Instructions (Signed)
  Your next office appointment will be determined based upon review of your pending labs  and  xrays  Those written interpretation of the lab results and instructions will be transmitted to you by My Chart   Critical results will be called.   Followup as needed for any active or acute issue. Please report any significant change in your symptoms. 

## 2014-11-30 NOTE — Progress Notes (Signed)
Pre visit review using our clinic review tool, if applicable. No additional management support is needed unless otherwise documented below in the visit note. 

## 2014-12-01 ENCOUNTER — Other Ambulatory Visit: Payer: Self-pay | Admitting: Internal Medicine

## 2014-12-01 DIAGNOSIS — E782 Mixed hyperlipidemia: Secondary | ICD-10-CM

## 2014-12-01 DIAGNOSIS — E119 Type 2 diabetes mellitus without complications: Secondary | ICD-10-CM

## 2014-12-02 ENCOUNTER — Encounter: Payer: Self-pay | Admitting: Internal Medicine

## 2014-12-05 ENCOUNTER — Encounter: Payer: Self-pay | Admitting: Internal Medicine

## 2014-12-07 MED ORDER — METFORMIN HCL ER 500 MG PO TB24: 500.0000 mg | ORAL_TABLET | Freq: Two times a day (BID) | ORAL | Status: DC

## 2014-12-21 ENCOUNTER — Other Ambulatory Visit: Payer: Self-pay | Admitting: Internal Medicine

## 2015-02-19 DIAGNOSIS — Z23 Encounter for immunization: Secondary | ICD-10-CM | POA: Diagnosis not present

## 2015-04-03 ENCOUNTER — Ambulatory Visit (INDEPENDENT_AMBULATORY_CARE_PROVIDER_SITE_OTHER): Payer: Medicare Other | Admitting: Internal Medicine

## 2015-04-03 ENCOUNTER — Encounter: Payer: Self-pay | Admitting: Internal Medicine

## 2015-04-03 ENCOUNTER — Other Ambulatory Visit (INDEPENDENT_AMBULATORY_CARE_PROVIDER_SITE_OTHER): Payer: Medicare Other

## 2015-04-03 VITALS — BP 140/90 | HR 81 | Temp 98.7°F | Ht 62.0 in | Wt 188.5 lb

## 2015-04-03 DIAGNOSIS — R5383 Other fatigue: Secondary | ICD-10-CM

## 2015-04-03 DIAGNOSIS — R300 Dysuria: Secondary | ICD-10-CM

## 2015-04-03 DIAGNOSIS — I10 Essential (primary) hypertension: Secondary | ICD-10-CM | POA: Diagnosis not present

## 2015-04-03 DIAGNOSIS — R05 Cough: Secondary | ICD-10-CM

## 2015-04-03 DIAGNOSIS — M791 Myalgia: Secondary | ICD-10-CM

## 2015-04-03 DIAGNOSIS — R509 Fever, unspecified: Secondary | ICD-10-CM

## 2015-04-03 DIAGNOSIS — M609 Myositis, unspecified: Secondary | ICD-10-CM

## 2015-04-03 DIAGNOSIS — IMO0001 Reserved for inherently not codable concepts without codable children: Secondary | ICD-10-CM

## 2015-04-03 DIAGNOSIS — R059 Cough, unspecified: Secondary | ICD-10-CM

## 2015-04-03 LAB — URINALYSIS, ROUTINE W REFLEX MICROSCOPIC
BILIRUBIN URINE: NEGATIVE
KETONES UR: NEGATIVE
NITRITE: NEGATIVE
PH: 7.5 (ref 5.0–8.0)
Specific Gravity, Urine: 1.015 (ref 1.000–1.030)
TOTAL PROTEIN, URINE-UPE24: NEGATIVE
Urine Glucose: NEGATIVE
Urobilinogen, UA: 0.2 (ref 0.0–1.0)

## 2015-04-03 LAB — BASIC METABOLIC PANEL
BUN: 9 mg/dL (ref 6–23)
CALCIUM: 9.6 mg/dL (ref 8.4–10.5)
CO2: 29 mEq/L (ref 19–32)
CREATININE: 0.8 mg/dL (ref 0.40–1.20)
Chloride: 98 mEq/L (ref 96–112)
GFR: 76.16 mL/min (ref >60.00)
GLUCOSE: 113 mg/dL — AB (ref 70–99)
Potassium: 4.5 mEq/L (ref 3.5–5.1)
Sodium: 135 mEq/L (ref 135–145)

## 2015-04-03 LAB — CBC WITH DIFFERENTIAL/PLATELET
BASOS ABS: 0 10*3/uL (ref 0.0–0.1)
Basophils Relative: 0.7 % (ref 0.0–3.0)
EOS ABS: 0.2 10*3/uL (ref 0.0–0.7)
EOS PCT: 3.4 % (ref 0.0–5.0)
HCT: 34.4 % — ABNORMAL LOW (ref 36.0–46.0)
Hemoglobin: 11.4 g/dL — ABNORMAL LOW (ref 12.0–15.0)
LYMPHS ABS: 1.5 10*3/uL (ref 0.7–4.0)
Lymphocytes Relative: 28.3 % (ref 12.0–46.0)
MCHC: 33 g/dL (ref 30.0–36.0)
MCV: 74.3 fl — ABNORMAL LOW (ref 78.0–100.0)
MONO ABS: 0.7 10*3/uL (ref 0.1–1.0)
Monocytes Relative: 14 % — ABNORMAL HIGH (ref 3.0–12.0)
NEUTROS PCT: 53.6 % (ref 43.0–77.0)
Neutro Abs: 2.8 10*3/uL (ref 1.4–7.7)
Platelets: 335 10*3/uL (ref 150.0–400.0)
RBC: 4.64 Mil/uL (ref 3.87–5.11)
RDW: 14.6 % (ref 11.5–15.5)
WBC: 5.3 10*3/uL (ref 4.0–10.5)

## 2015-04-03 LAB — HEPATIC FUNCTION PANEL
ALT: 17 U/L (ref 0–35)
AST: 16 U/L (ref 0–37)
Albumin: 4.1 g/dL (ref 3.5–5.2)
Alkaline Phosphatase: 53 U/L (ref 39–117)
BILIRUBIN DIRECT: 0.1 mg/dL (ref 0.0–0.3)
BILIRUBIN TOTAL: 0.5 mg/dL (ref 0.2–1.2)
Total Protein: 7.3 g/dL (ref 6.0–8.3)

## 2015-04-03 LAB — CK: CK TOTAL: 67 U/L (ref 7–177)

## 2015-04-03 MED ORDER — LOSARTAN POTASSIUM 100 MG PO TABS: 100.0000 mg | ORAL_TABLET | Freq: Every day | ORAL | Status: DC

## 2015-04-03 NOTE — Progress Notes (Signed)
   Subjective:    Patient ID: Heather [name], female    DOB: 04-15-1951, 64 y.o.   MRN: [id number]  HPI She describes myalgias and fatigue since 03/31/15. This has been associated with body cramps and myalgias especially in the legs. She's had associated fever chills. Temperature was up to 101 this morning. The clinical picture has been associated with decreased appetite. Because of this she has forced fluids to prevent dehydration and has stopped her metformin  She's been using Tylenol and Advil with only partial response.  She has some sneezing but no other extrinsic symptoms.  She also describes dry cough but this been present for several months. She questions whether this is related to lisinopril  She also has had some dysuria without other GU symptoms  Last week she had a molar removed from the left posterior mandible. There were no associated complications.  She also describes "restless leg" symptoms for last 4 weeks. She states she must get up and pace at night.  She's had no bleeding dyscrasias.   Review of Systems  Frontal headache, facial pain , nasal purulence, dental pain, sore throat , otic pain or otic discharge denied. Sputum production, hemoptysis, or pleuritic pain denied. No diarrhea present.Cola colored urine or clay colored stools denied. Pyuria or hematuria not present. No vaginal discharge or bleeding  noted. No new rashes, pustules, vesicles. No redness or swelling of joints. No significant travel, pets, or tick exposures. Epistaxis, hemoptysis, hematuria, melena, or rectal bleeding denied. No unexplained weight loss, significant dyspepsia,dysphagia, or abdominal pain.  There is no abnormal bruising , bleeding, or difficulty stopping bleeding with injury.     Objective:   Physical Exam Pertinent or positive findings include: She appears somewhat uncomfortable but in no acute distress. There is slight tenting of the skin. She has slight crepitus of the  knees. There is no sign of meningismus.  General appearance :adequately nourished.  Eyes: No conjunctival inflammation or scleral icterus is present.  Oral exam:  Lips and gums are healthy appearing.There is no oropharyngeal erythema or exudate noted. Dental hygiene is good.No mandibular abscess suggested.  Heart:  Normal rate and regular rhythm. S1 and S2 normal without gallop, murmur, click, rub or other extra sounds    Lungs:Chest clear to auscultation; no wheezes, rhonchi,rales ,or rubs present.No increased work of breathing.   Abdomen: bowel sounds normal, soft and non-tender without masses, organomegaly or hernias noted.  No guarding or rebound. No flank tenderness to percussion.  Vascular : all pulses equal ; no bruits present.  Skin:Warm & dry.  Intact without suspicious lesions or rashes ; no jaundice   Lymphatic: No lymphadenopathy is noted about the head, neck, axilla.   Neuro: Strength, tone & DTRs normal.        Assessment & Plan:  #1 chills, fever  #2 dysuria  #3 myalgias  #4 fatigue  #5 restless leg syndrome  See orders recommendations

## 2015-04-03 NOTE — Progress Notes (Signed)
Pre visit review using our clinic review tool, if applicable. No additional management support is needed unless otherwise documented below in the visit note. 

## 2015-04-03 NOTE — Patient Instructions (Signed)
Stay on clear liquids for 48-72 hours .This would include  jello, sherbert (NOT ice cream), Lipton's chicken noodle soup(NOT cream based soups),Gatorade Lite, flat Ginger ale (without High Fructose Corn Syrup),dry toast or crackers, baked potato.No milk , dairy or grease . Align , a [employer] , daily if stools are loose. Immodium AD for frankly watery stool. Report increasing pain, fever or rectal bleedingYour next office appointment will be determined based upon review of your pending labs  and  xrays  Those written interpretation of the lab results and instructions will be transmitted to you by My Chart  Critical results will be called.   Followup as needed for any active or acute issue. Please report any significant change in your symptoms.

## 2015-04-04 ENCOUNTER — Other Ambulatory Visit (INDEPENDENT_AMBULATORY_CARE_PROVIDER_SITE_OTHER): Payer: Medicare Other

## 2015-04-04 DIAGNOSIS — E119 Type 2 diabetes mellitus without complications: Secondary | ICD-10-CM | POA: Diagnosis not present

## 2015-04-04 LAB — HEMOGLOBIN A1C: HEMOGLOBIN A1C: 7 % — AB (ref 4.6–6.5)

## 2015-04-06 LAB — CULTURE, URINE COMPREHENSIVE: Colony Count: 60000

## 2015-04-18 ENCOUNTER — Other Ambulatory Visit: Payer: Self-pay | Admitting: Internal Medicine

## 2015-04-21 ENCOUNTER — Encounter: Payer: Self-pay | Admitting: Internal Medicine

## 2015-04-21 ENCOUNTER — Other Ambulatory Visit: Payer: Self-pay | Admitting: Internal Medicine

## 2015-04-21 DIAGNOSIS — D649 Anemia, unspecified: Secondary | ICD-10-CM

## 2015-04-21 DIAGNOSIS — E611 Iron deficiency: Secondary | ICD-10-CM | POA: Insufficient documentation

## 2015-05-06 ENCOUNTER — Other Ambulatory Visit (INDEPENDENT_AMBULATORY_CARE_PROVIDER_SITE_OTHER): Payer: Medicare Other

## 2015-05-06 DIAGNOSIS — D649 Anemia, unspecified: Secondary | ICD-10-CM

## 2015-05-06 LAB — CBC WITH DIFFERENTIAL/PLATELET
BASOS PCT: 0.4 % (ref 0.0–3.0)
Basophils Absolute: 0 10*3/uL (ref 0.0–0.1)
EOS ABS: 0.2 10*3/uL (ref 0.0–0.7)
Eosinophils Relative: 3.3 % (ref 0.0–5.0)
HEMATOCRIT: 33.1 % — AB (ref 36.0–46.0)
Hemoglobin: 10.8 g/dL — ABNORMAL LOW (ref 12.0–15.0)
Lymphocytes Relative: 28.4 % (ref 12.0–46.0)
Lymphs Abs: 1.9 10*3/uL (ref 0.7–4.0)
MCHC: 32.6 g/dL (ref 30.0–36.0)
MCV: 73.9 fl — ABNORMAL LOW (ref 78.0–100.0)
MONO ABS: 0.6 10*3/uL (ref 0.1–1.0)
Monocytes Relative: 8.6 % (ref 3.0–12.0)
NEUTROS ABS: 3.9 10*3/uL (ref 1.4–7.7)
Neutrophils Relative %: 59.3 % (ref 43.0–77.0)
PLATELETS: 385 10*3/uL (ref 150.0–400.0)
RBC: 4.48 Mil/uL (ref 3.87–5.11)
RDW: 14.9 % (ref 11.5–15.5)
WBC: 6.6 10*3/uL (ref 4.0–10.5)

## 2015-05-06 LAB — IBC PANEL
IRON: 51 ug/dL (ref 42–145)
Saturation Ratios: 9.1 % — ABNORMAL LOW (ref 20.0–50.0)
TRANSFERRIN: 401 mg/dL — AB (ref 212.0–360.0)

## 2015-05-06 LAB — FERRITIN: Ferritin: 4.8 ng/mL — ABNORMAL LOW (ref 10.0–291.0)

## 2015-05-28 ENCOUNTER — Telehealth: Payer: Self-pay | Admitting: Internal Medicine

## 2015-05-28 NOTE — Telephone Encounter (Signed)
Patient has appointment to transfer over to Dr. Quay Burow from [place] in March.  The first available opening to transfer was end of February but patient will be out of town.  Patient states Dr. Linna Darner came to her house in December concerned about her being seen by Dr. Earlean Shawl.  States Dr. Linna Darner was to enter a referral for her but this is not in the system.  She is also concerned about her medication refills.  Can you look into referral and refills for patient and give a call back.

## 2015-05-28 NOTE — Telephone Encounter (Signed)
Spoke with pt. She is going to call Dr Oswald Hillock office to ask them to put the referral in for GI before getting [place] to enter the referral. She will call back if needed.

## 2015-05-30 ENCOUNTER — Other Ambulatory Visit: Payer: Self-pay | Admitting: Internal Medicine

## 2015-05-30 DIAGNOSIS — D509 Iron deficiency anemia, unspecified: Secondary | ICD-10-CM

## 2015-05-30 NOTE — Progress Notes (Signed)
Faxed to [facility]. They will contact the patient. Miss you too!

## 2015-05-31 DIAGNOSIS — E119 Type 2 diabetes mellitus without complications: Secondary | ICD-10-CM | POA: Diagnosis not present

## 2015-05-31 DIAGNOSIS — H25043 Posterior subcapsular polar age-related cataract, bilateral: Secondary | ICD-10-CM | POA: Diagnosis not present

## 2015-05-31 DIAGNOSIS — H2513 Age-related nuclear cataract, bilateral: Secondary | ICD-10-CM | POA: Diagnosis not present

## 2015-05-31 LAB — HM DIABETES EYE EXAM

## 2015-06-04 DIAGNOSIS — D509 Iron deficiency anemia, unspecified: Secondary | ICD-10-CM | POA: Diagnosis not present

## 2015-06-13 DIAGNOSIS — D509 Iron deficiency anemia, unspecified: Secondary | ICD-10-CM | POA: Diagnosis not present

## 2015-06-13 DIAGNOSIS — D122 Benign neoplasm of ascending colon: Secondary | ICD-10-CM | POA: Diagnosis not present

## 2015-06-13 DIAGNOSIS — K219 Gastro-esophageal reflux disease without esophagitis: Secondary | ICD-10-CM | POA: Diagnosis not present

## 2015-06-13 DIAGNOSIS — D123 Benign neoplasm of transverse colon: Secondary | ICD-10-CM | POA: Diagnosis not present

## 2015-06-13 DIAGNOSIS — K5730 Diverticulosis of large intestine without perforation or abscess without bleeding: Secondary | ICD-10-CM | POA: Diagnosis not present

## 2015-06-13 DIAGNOSIS — Z1211 Encounter for screening for malignant neoplasm of colon: Secondary | ICD-10-CM | POA: Diagnosis not present

## 2015-06-13 DIAGNOSIS — D124 Benign neoplasm of descending colon: Secondary | ICD-10-CM | POA: Diagnosis not present

## 2015-06-13 DIAGNOSIS — K648 Other hemorrhoids: Secondary | ICD-10-CM | POA: Diagnosis not present

## 2015-06-13 DIAGNOSIS — D120 Benign neoplasm of cecum: Secondary | ICD-10-CM | POA: Diagnosis not present

## 2015-06-13 DIAGNOSIS — K449 Diaphragmatic hernia without obstruction or gangrene: Secondary | ICD-10-CM | POA: Diagnosis not present

## 2015-06-13 DIAGNOSIS — K228 Other specified diseases of esophagus: Secondary | ICD-10-CM | POA: Diagnosis not present

## 2015-06-26 DIAGNOSIS — D120 Benign neoplasm of cecum: Secondary | ICD-10-CM | POA: Diagnosis not present

## 2015-07-03 DIAGNOSIS — R102 Pelvic and perineal pain: Secondary | ICD-10-CM | POA: Diagnosis not present

## 2015-07-03 DIAGNOSIS — R350 Frequency of micturition: Secondary | ICD-10-CM | POA: Diagnosis not present

## 2015-07-03 DIAGNOSIS — N3010 Interstitial cystitis (chronic) without hematuria: Secondary | ICD-10-CM | POA: Diagnosis not present

## 2015-07-03 DIAGNOSIS — R300 Dysuria: Secondary | ICD-10-CM | POA: Diagnosis not present

## 2015-07-31 ENCOUNTER — Other Ambulatory Visit: Payer: Self-pay | Admitting: Surgery

## 2015-07-31 DIAGNOSIS — K635 Polyp of colon: Secondary | ICD-10-CM | POA: Diagnosis not present

## 2015-08-02 ENCOUNTER — Encounter: Payer: Self-pay | Admitting: Internal Medicine

## 2015-08-02 ENCOUNTER — Ambulatory Visit (INDEPENDENT_AMBULATORY_CARE_PROVIDER_SITE_OTHER): Payer: Medicare Other | Admitting: Internal Medicine

## 2015-08-02 VITALS — BP 120/84 | HR 91 | Temp 97.9°F | Resp 18 | Wt 196.0 lb

## 2015-08-02 DIAGNOSIS — E119 Type 2 diabetes mellitus without complications: Secondary | ICD-10-CM | POA: Diagnosis not present

## 2015-08-02 DIAGNOSIS — I10 Essential (primary) hypertension: Secondary | ICD-10-CM

## 2015-08-02 DIAGNOSIS — Z23 Encounter for immunization: Secondary | ICD-10-CM | POA: Diagnosis not present

## 2015-08-02 DIAGNOSIS — K449 Diaphragmatic hernia without obstruction or gangrene: Secondary | ICD-10-CM | POA: Diagnosis not present

## 2015-08-02 DIAGNOSIS — N3010 Interstitial cystitis (chronic) without hematuria: Secondary | ICD-10-CM | POA: Diagnosis not present

## 2015-08-02 DIAGNOSIS — K635 Polyp of colon: Secondary | ICD-10-CM | POA: Insufficient documentation

## 2015-08-02 MED ORDER — METFORMIN HCL ER 500 MG PO TB24: 500.0000 mg | ORAL_TABLET | Freq: Two times a day (BID) | ORAL | Status: DC

## 2015-08-02 MED ORDER — LOSARTAN POTASSIUM 100 MG PO TABS: 100.0000 mg | ORAL_TABLET | Freq: Every day | ORAL | Status: DC

## 2015-08-02 MED ORDER — HYDROCHLOROTHIAZIDE 12.5 MG PO CAPS: 12.5000 mg | ORAL_CAPSULE | Freq: Every day | ORAL | Status: DC

## 2015-08-02 NOTE — Patient Instructions (Addendum)
   Medications reviewed and updated.  No changes recommended at this time.  Your prescription(s) have been submitted to your pharmacy. Please take as directed and contact our office if you believe you are having problem(s) with the medication(s).   Please followup in 6 months for a wellness visit

## 2015-08-02 NOTE — Assessment & Plan Note (Signed)
Taking zantac, no gerd

## 2015-08-02 NOTE — Assessment & Plan Note (Signed)
Recent colonoscopy - large polyps in cecum - concern for cancer/precancer Doing surgery for right hemicolectomy 4/3

## 2015-08-02 NOTE — Assessment & Plan Note (Signed)
Last a1c was 7.0 She will try to do better with her diet Continue regular exercise a1c to be checked by surgery contninue current mediation - will titrate if needed

## 2015-08-02 NOTE — Assessment & Plan Note (Signed)
BP well controlled Current regimen effective and well tolerated Continue current medications at current doses  

## 2015-08-02 NOTE — Progress Notes (Signed)
Pre visit review using our clinic review tool, if applicable. No additional management support is needed unless otherwise documented below in the visit note. 

## 2015-08-02 NOTE — Progress Notes (Signed)
Subjective:    Patient ID: Heather [name], female    DOB: Jan 03, 1951, 65 y.o.   MRN: [id number]  HPI She is here to establish with a new pcp.  She is here for follow up.   Hypertension: She is taking her medication daily. She is compliant with a low sodium diet.  She denies chest pain, palpitations, edema, shortness of breath and regular headaches. She is exercising regularly - walking.  She does monitor her blood pressure at home and it is well controlled.    Diabetes: She is taking her medication daily as prescribed. She is not compliant with a diabetic diet. She is exercising regularly. She monitors her sugars and they have been running 143 fasting. She checks her feet daily and denies foot lesions. She is up-to-date with an ophthalmology examination - done in last couple of months.   Anemia, Colon polyps:  She will be having surgery to have part of her colon removed - ascending colon due to large polyps possible precancerous.  Surgery is scheduled for 08/19/15.    Medications and allergies reviewed with patient and updated if appropriate.  Patient Active Problem List   Diagnosis Date Noted  . Anemia, unspecified 04/21/2015  . Diabetes type 2, controlled ([place]) 05/03/2014  . Hyperlipidemia, mixed 03/18/2014  . Epistaxis 09/06/2013  . Headache(784.0) 09/06/2013  . Lymphadenopathy 08/22/2012  . Interstitial cystitis 07/08/2012  . Essential hypertension 02/10/2011    Current Outpatient Prescriptions on File Prior to Visit  Medication Sig Dispense Refill  . Blood Glucose Monitoring Suppl ([employer] BLOOD GLUCOSE METER) W/DEVICE KIT Use to test blood sugar twice daily ICD 10 E11 9 1 kit 0  . [facility] LANCETS THIN MISC Use to test blood sugar twice daily ICD 19 E11 9 100 each 3  . glucose blood ([employer] BLOOD GLUCOSE TEST STRIPS) test strip Use to test blood sugar twice daily ICD 10 E11 9 100 each 3  . losartan (COZAAR) 100 MG tablet Take 1 tablet (100 mg total) by mouth daily. 30 tablet 5  .  metFORMIN (GLUCOPHAGE-XR) 500 MG 24 hr tablet Take 1 tablet (500 mg total) by mouth 2 (two) times daily with a meal. 180 tablet 3   No current facility-administered medications on file prior to visit.    Past Medical History  Diagnosis Date  . Hypertension   . Interstitial cystitis     Dr Janice Norrie    Past Surgical History  Procedure Laterality Date  . Cystoscopy      X 4; Dr Janice Norrie  . Appendectomy    . Total abdominal hysterectomy      metromenorrhagia  . Bilateral salpingoophorectomy      painful cysts  . Colonoscopy  2005    negative; Dr Earlean Shawl. F/U declined    Social History   Social History  . Marital Status: Widowed    Spouse Name: N/A  . Number of Children: N/A  . Years of Education: N/A   Social History Main Topics  . Smoking status: Former Smoker    Quit date: 05/18/1984  . Smokeless tobacco: Not on file     Comment: smoked 1972-1986, up to 1 ppd  . Alcohol Use: No  . Drug Use: No  . Sexual Activity: Not on file   Other Topics Concern  . Not on file   Social History Narrative    Family History  Problem Relation Age of Onset  . Heart attack Mother     in 53s  . Hypertension  Mother   . Stroke Mother      in 72s  . Stroke Maternal Grandfather 50  . Diabetes Neg Hx   . Heart attack Paternal Grandfather     in 60s  . Breast cancer Sister     Review of Systems  Constitutional: Positive for fatigue (related to anemia). Negative for fever and chills.  Respiratory: Positive for cough (related to allergies) and shortness of breath (related to anemia). Negative for wheezing.   Cardiovascular: Negative for chest pain, palpitations and leg swelling.  Gastrointestinal: Positive for nausea and abdominal pain (discomfort since colonosocpy).       Variable bowels since colonoscopy  Genitourinary: Negative for dysuria and hematuria.  Neurological: Negative for dizziness, light-headedness and headaches.       Objective:   Filed Vitals:   08/02/15 1405  BP:  120/84  Pulse: 91  Temp: 97.9 F (36.6 C)  Resp: 18   Filed Weights   08/02/15 1405  Weight: 196 lb (88.905 kg)   Body mass index is 35.84 kg/(m^2).   Physical Exam Constitutional: Appears well-developed and well-nourished. No distress.  Neck: Neck supple. No tracheal deviation present. No thyromegaly present.  No carotid bruit. No cervical adenopathy.   Cardiovascular: Normal rate, regular rhythm and normal heart sounds.   No murmur heard.  No edema Pulmonary/Chest: Effort normal and breath sounds normal. No respiratory distress. No wheezes.       Assessment & Plan:    See Problem List for Assessment and Plan of chronic medical problems.  Pneumovax today   Follow up in 6 months

## 2015-08-09 NOTE — Patient Instructions (Addendum)
YOUR PROCEDURE IS SCHEDULED ON :  08/19/15  REPORT TO [facility] MAIN ENTRANCE FOLLOW SIGNS TO EAST ELEVATOR - GO TO 3rd FLOOR CHECK IN AT 3 EAST NURSES STATION (SHORT STAY) AT: 5:15 AM  CALL THIS NUMBER IF YOU HAVE PROBLEMS THE MORNING OF SURGERY [phone]  REMEMBER:ONLY 1 PER PERSON MAY GO TO SHORT STAY WITH YOU TO GET READY THE MORNING OF YOUR SURGERY  DO NOT EAT FOOD OR DRINK LIQUIDS AFTER MIDNIGHT  TAKE THESE MEDICINES THE MORNING OF SURGERY: NONE  FOLLOW BOWEL PREP INSTRUCTIONS FROM OFFICE  YOU MAY NOT HAVE ANY METAL ON YOUR BODY INCLUDING HAIR PINS AND PIERCING'S. DO NOT WEAR JEWELRY, MAKEUP, LOTIONS, POWDERS OR PERFUMES. DO NOT WEAR NAIL POLISH. DO NOT SHAVE 48 HRS PRIOR TO SURGERY. MEN MAY SHAVE FACE AND NECK.  DO NOT [place]. [facility] IS NOT RESPONSIBLE FOR VALUABLES.  CONTACTS, DENTURES OR PARTIALS MAY NOT BE WORN TO SURGERY. LEAVE SUITCASE IN CAR. CAN BE BROUGHT TO ROOM AFTER SURGERY.  PATIENTS DISCHARGED THE DAY OF SURGERY WILL NOT BE ALLOWED TO DRIVE HOME.  PLEASE READ OVER THE FOLLOWING INSTRUCTION SHEETS _________________________________________________________________________________                                          [facility] - PREPARING FOR SURGERY  Before surgery, you can play an important role.  Because skin is not sterile, your skin needs to be as free of germs as possible.  You can reduce the number of germs on your skin by washing with CHG (chlorahexidine gluconate) soap before surgery.  CHG is an antiseptic cleaner which kills germs and bonds with the skin to continue killing germs even after washing. Please DO NOT use if you have an allergy to CHG or antibacterial soaps.  If your skin becomes reddened/irritated stop using the CHG and inform your nurse when you arrive at Short Stay. Do not shave (including legs and underarms) for at least 48 hours prior to the first CHG shower.  You may shave your  face. Please follow these instructions carefully:   1.  Shower with CHG Soap the night before surgery and the  morning of Surgery.   2.  If you choose to wash your hair, wash your hair first as usual with your  normal  Shampoo.   3.  After you shampoo, rinse your hair and body thoroughly to remove the  shampoo.                                         4.  Use CHG as you would any other liquid soap.  You can apply chg directly  to the skin and wash . Gently wash with scrungie or clean wascloth    5.  Apply the CHG Soap to your body ONLY FROM THE NECK DOWN.   Do not use on open                           Wound or open sores. Avoid contact with eyes, ears mouth and genitals (private parts).                        Genitals (private parts)  with your normal soap.              6.  Wash thoroughly, paying special attention to the area where your surgery  will be performed.   7.  Thoroughly rinse your body with warm water from the neck down.   8.  DO NOT shower/wash with your normal soap after using and rinsing off  the CHG Soap .                9.  Pat yourself dry with a clean towel.             10.  Wear clean night clothes to bed after shower             11.  Place clean sheets on your bed the night of your first shower and do not  sleep with pets.  Day of Surgery : Do not apply any lotions/deodorants the morning of surgery.  Please wear clean clothes to the hospital/surgery center.  FAILURE TO FOLLOW THESE INSTRUCTIONS MAY RESULT IN THE CANCELLATION OF YOUR SURGERY    PATIENT SIGNATURE_________________________________  ______________________________________________________________________

## 2015-08-13 ENCOUNTER — Encounter (HOSPITAL_COMMUNITY)
Admission: RE | Admit: 2015-08-13 | Discharge: 2015-08-13 | Disposition: A | Discharge status: Home / Self Care | Payer: Medicare Other | Source: Ambulatory Visit | Attending: Surgery | Admitting: Surgery

## 2015-08-13 ENCOUNTER — Encounter (HOSPITAL_COMMUNITY): Payer: Self-pay

## 2015-08-13 DIAGNOSIS — Z01812 Encounter for preprocedural laboratory examination: Secondary | ICD-10-CM | POA: Diagnosis not present

## 2015-08-13 DIAGNOSIS — K635 Polyp of colon: Secondary | ICD-10-CM | POA: Insufficient documentation

## 2015-08-13 HISTORY — DX: Cardiac murmur, unspecified: R01.1

## 2015-08-13 HISTORY — DX: Personal history of other diseases of the digestive system: Z87.19

## 2015-08-13 HISTORY — DX: Other allergy status, other than to drugs and biological substances: Z91.09

## 2015-08-13 HISTORY — DX: Type 2 diabetes mellitus without complications: E11.9

## 2015-08-13 HISTORY — DX: Gastro-esophageal reflux disease without esophagitis: K21.9

## 2015-08-13 HISTORY — DX: Personal history of other venous thrombosis and embolism: Z86.718

## 2015-08-13 HISTORY — DX: Personal history of other medical treatment: Z92.89

## 2015-08-13 HISTORY — DX: Reserved for inherently not codable concepts without codable children: IMO0001

## 2015-08-13 HISTORY — DX: Polyp of colon: K63.5

## 2015-08-13 HISTORY — DX: Anemia, unspecified: D64.9

## 2015-08-13 LAB — CBC WITH DIFFERENTIAL/PLATELET
BASOS ABS: 0 10*3/uL (ref 0.0–0.1)
Basophils Relative: 0 %
Eosinophils Absolute: 0.2 10*3/uL (ref 0.0–0.7)
Eosinophils Relative: 3 %
HEMATOCRIT: 36.4 % (ref 36.0–46.0)
Hemoglobin: 12.2 g/dL (ref 12.0–15.0)
LYMPHS PCT: 30 %
Lymphs Abs: 1.6 10*3/uL (ref 0.7–4.0)
MCH: 25.7 pg — ABNORMAL LOW (ref 26.0–34.0)
MCHC: 33.5 g/dL (ref 30.0–36.0)
MCV: 76.8 fL — AB (ref 78.0–100.0)
MONO ABS: 0.6 10*3/uL (ref 0.1–1.0)
Monocytes Relative: 11 %
NEUTROS ABS: 3 10*3/uL (ref 1.7–7.7)
Neutrophils Relative %: 56 %
Platelets: 352 10*3/uL (ref 150–400)
RBC: 4.74 MIL/uL (ref 3.87–5.11)
RDW: 15.6 % — ABNORMAL HIGH (ref 11.5–15.5)
WBC: 5.2 10*3/uL (ref 4.0–10.5)

## 2015-08-13 LAB — COMPREHENSIVE METABOLIC PANEL
ALT: 20 U/L (ref 14–54)
AST: 18 U/L (ref 15–41)
Albumin: 4.4 g/dL (ref 3.5–5.0)
Alkaline Phosphatase: 53 U/L (ref 38–126)
Anion gap: 9 (ref 5–15)
BILIRUBIN TOTAL: 1 mg/dL (ref 0.3–1.2)
BUN: 16 mg/dL (ref 6–20)
CO2: 27 mmol/L (ref 22–32)
CREATININE: 0.78 mg/dL (ref 0.44–1.00)
Calcium: 10 mg/dL (ref 8.9–10.3)
Chloride: 103 mmol/L (ref 101–111)
GFR calc Af Amer: >60 mL/min (ref >60)
Glucose, Bld: 127 mg/dL — ABNORMAL HIGH (ref 65–99)
Potassium: 4.3 mmol/L (ref 3.5–5.1)
Sodium: 139 mmol/L (ref 135–145)
TOTAL PROTEIN: 7.4 g/dL (ref 6.5–8.1)

## 2015-08-13 LAB — ABO/RH: ABO/RH(D): A NEG

## 2015-08-14 LAB — CEA: CEA: 1.9 ng/mL (ref 0.0–4.7)

## 2015-08-14 LAB — HEMOGLOBIN A1C
HEMOGLOBIN A1C: 7.3 % — AB (ref 4.8–5.6)
MEAN PLASMA GLUCOSE: 163 mg/dL

## 2015-08-18 NOTE — H&P (Signed)
Heather [name]. [place]  Location: [facility] Patient #: [id number] DOB: [birth year] Widowed / Language: English / Race: White Female  History of Present Illness   The patient is a 65 year old female who presents with a complaint of cecal polyp.   Her PCP is Dr. Billey Gosling. She did see Dr. Linna Darner until his retirement.  Her GI is Dr. Lana Fish  She comes by herself.  She was a patient of Dr. Linna Darner. Her new PCP is Dr. Quay Burow, whom she is to meet this Friday.  The patient has suffered increasing fatigue recently. This prompted an evaluation for possible anemia. She was found to have a decreased ferritin (4.8 on 04/30/2015) with normal iron. This prompted a referral for colonoscopy. Her last colonoscopy was 2002. She was supposed to have a colonoscopy around 2012, but that was a year her husband died, and may things got pushed back. The patient had a colonoscopy by Dr. Earlie Raveling on 13 June 2015 and she was found to have a large cecal polyp and a large flat sessile polyp of the ascending colon. The path report showed tubular adenomas. She's had a lengthy discussion about polypectomy at a tertiary care center. She has seen Dr. Carol Ada for a second opinion. She has decided to proceed with a colectomy versus further colonoscopic attempts at removing the polyps. She denies a history of peptic ulcer disease, liver disease, pancreas disease. She had a vaginal hysterectomy around 69. At a separate operation she had a bilateral oophorectomy. She did not remember the name of the physician who did that. In Epic, there is an operative note dated 01 August 1999 by Dr. Ulyses Southward - laparoscopic right salpingo-oophorectomy and lysis of adhesions. She is anxious about cost of treatment. She seems to have limited financial reverves.  I reviewed with the patient the findings and need for colon surgery. I discussed both surgical and  non surgical options. I discussed the role of laparoscopic and open surgery in colon surgery. I reviewed the risks of surgery, including, but not limited to, infection, bleeding, nerve injury, anastomotic leaks, and possibility of colostomy. I went over the preoperative mechanical and antibiotic bowel prep for colon surgery and provided prescriptions for these. I provided the patient literature on colon surgery. I tried to answer all questions for the patient.  Past Medical History: 1. Fatigue 2. DM x 2 years On pills 3. HTN 4. Hyperlipidemia. 5. History of heart cath - 02/2002 - Dr. Ocie Doyne no coronary art disease she said that she has a murmur. 6. History of GERD.  7. Interstitial cystitis Saw Dr. Janice Norrie until his retirement. She cannot remember who she is seeing now. 8. She had her appendix removed in [place] in the 1960's.  Social History:  Widowed. Her brother (who was homeless) and her daughter. 65 yo, (with 2 grandchildren) live with her. She also has a son who is a Company secretary, 50 yo. He just got out of the [employer] [place]. 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    Other Problems Elbert Ewings, CMA; 07/31/2015 10:02 AM) Back Pain Bladder Problems Diabetes Mellitus Gastroesophageal Reflux Disease Heart murmur High blood pressure Hypercholesterolemia Oophorectomy Bilateral. Pulmonary Embolism / Blood Clot in Legs Transfusion history  Past Surgical History Elbert Ewings, CMA; 07/31/2015 10:02 AM) Appendectomy Colon Polyp Removal - Colonoscopy Hysterectomy (not due to cancer) - Complete  Diagnostic Studies History Elbert Ewings, CMA; 07/31/2015 10:02 AM) Colonoscopy within last year  Mammogram within last year Pap Smear >5 years ago  Allergies Elbert Ewings, CMA; 07/31/2015 10:03 AM) Lisinopril *CHEMICALS* Sulfo Lo *DERMATOLOGICALS*  Medication History Elbert Ewings, CMA; 07/31/2015 10:04  AM) Losartan Potassium (100MG  Tablet, Oral) Active. Elmiron (100MG  Capsule, Oral) Active. Iron (325 (65 Fe)MG Tablet, Oral) Active. MetFORMIN HCl (500MG  Tablet, Oral) Active. RaNITidine HCl (150MG  Capsule, Oral) Active. Gas-X Extra Strength (125MG  Capsule, Oral) Active. Uribel (118MG  Capsule, Oral) Active. Medications Reconciled  Social History Elbert Ewings, CMA; 07/31/2015 10:02 AM) Caffeine use Coffee, Tea. No alcohol use No drug use Tobacco use Former smoker.  Family History Elbert Ewings, [place]; 07/31/2015 10:02 AM) Breast Cancer Family Members In General, Sister. Cerebrovascular Accident Mother. Heart Disease Family Members In General, Mother. Hypertension Mother, Sister.  Pregnancy / Birth History Elbert Ewings, CMA; 07/31/2015 10:02 AM) Age at menarche 62 years. Age of menopause 46-50 Contraceptive History Intrauterine device, Oral contraceptives. Gravida 2 Irregular periods Maternal age 91-25 Para 2   Review of Systems Elbert Ewings CMA; 07/31/2015 10:02 AM) General Present- Fatigue. Not Present- Appetite Loss, Chills, Fever, Night Sweats, Weight Gain and Weight Loss. Skin Not Present- Change in Wart/Mole, Dryness, Hives, Jaundice, New Lesions, Non-Healing Wounds, Rash and Ulcer. HEENT Present- Wears glasses/contact lenses. Not Present- Earache, Hearing Loss, Hoarseness, Nose Bleed, Oral Ulcers, Ringing in the Ears, Seasonal Allergies, Sinus Pain, Sore Throat, Visual Disturbances and Yellow Eyes. Respiratory Not Present- Bloody sputum, Chronic Cough, Difficulty Breathing, Snoring and Wheezing. Cardiovascular Present- Shortness of Breath. Not Present- Chest Pain, Difficulty Breathing Lying Down, Leg Cramps, Palpitations, Rapid Heart Rate and Swelling of Extremities. Gastrointestinal Present- Indigestion. Not Present- Abdominal Pain, Bloating, Bloody Stool, Change in Bowel Habits, Chronic diarrhea, Constipation, Difficulty Swallowing, Excessive gas, Gets full  quickly at meals, Hemorrhoids, Nausea, Rectal Pain and Vomiting. Female Genitourinary Present- Frequency and Nocturia. Not Present- Painful Urination, Pelvic Pain and Urgency. Musculoskeletal Present- Back Pain. Not Present- Joint Pain, Joint Stiffness, Muscle Pain, Muscle Weakness and Swelling of Extremities. Neurological Not Present- Decreased Memory, Fainting, Headaches, Numbness, Seizures, Tingling, Tremor, Trouble walking and Weakness. Psychiatric Not Present- Anxiety, Bipolar, Change in Sleep Pattern, Depression, Fearful and Frequent crying. Endocrine Present- New Diabetes. Not Present- Cold Intolerance, Excessive Hunger, Hair Changes, Heat Intolerance and Hot flashes. Hematology Not Present- Easy Bruising, Excessive bleeding, Gland problems, HIV and Persistent Infections.  Vitals Elbert Ewings CMA; 07/31/2015 10:05 AM) 07/31/2015 10:04 AM Weight: 195.8 lb Height: 62in Body Surface Area: 1.89 m Body Mass Index: 35.81 kg/m  Temp.: 98.62F(Temporal)  Pulse: 66 (Regular)  BP: 130/74 (Sitting, Left Arm, Standard)   Physical Exam  General: Obese WF alert and generally healthy appearing. She is anxious. HEENT: Normal. Pupils equal.  Neck: Supple. No mass. No thyroid mass.  Lymph Nodes: No supraclavicular or cervical nodes.  Lungs: Clear to auscultation and symmetric breath sounds. Heart: RRR. No murmur or rub. She said that she has a murmur, but I don't necessarily hear one.  Abdomen: Soft. No mass. No tenderness. No hernia. Normal bowel sounds. She has a RLQ scar and umbilical scar. Rectal: Not done.  Extremities: Good strength and ROM in upper and lower extremities.  Neurologic: Grossly intact to motor and sensory function. Psychiatric: Has normal mood and affect. Behavior is normal.   Assessment & Plan:  1.  SESSILE COLONIC POLYP (K63.5)  Story: Right colon polyp - colonoscopy by Dr. Earlean Shawl - 06/13/2015.  Plan:   1) bowel prep (mechanical and  antibiotic) and Laparoscopic rigth colectomy  2.  Fatigue 3. DM x 2 years On  pills 4. HTN 5. Hyperlipidemia. 6. History of heart cath - 02/2002 - Dr. Ocie Doyne  no coronary art disease  she said that she has a murmur. 7. History of GERD. 8. Interstitial cystitis  Alphonsa Overall, MD, [facility] Surgery Pager: [phone] Office phone:  [phone]

## 2015-08-18 NOTE — Anesthesia Preprocedure Evaluation (Addendum)
Anesthesia Evaluation  Patient identified by MRN, date of birth, ID band Patient awake    Reviewed: Allergy & Precautions, NPO status , Patient's Chart, lab work & pertinent test results  Airway Mallampati: II  TM Distance: >3 FB Neck ROM: Full    Dental no notable dental hx.    Pulmonary shortness of breath, former smoker,    Pulmonary exam normal breath sounds clear to auscultation       Cardiovascular hypertension, Pt. on medications Normal cardiovascular exam+ Valvular Problems/Murmurs  Rhythm:Regular Rate:Normal     Neuro/Psych  Headaches, negative psych ROS   GI/Hepatic Neg liver ROS, hiatal hernia, GERD  Medicated,  Endo/Other  diabetes, Poorly Controlled, Type 2, Oral Hypoglycemic Agents  Renal/GU negative Renal ROS     Musculoskeletal negative musculoskeletal ROS (+)   Abdominal (+) + obese,   Peds  Hematology  (+) anemia ,   Anesthesia Other Findings   Reproductive/Obstetrics negative OB ROS                            Anesthesia Physical Anesthesia Plan  ASA: III  Anesthesia Plan: General   Post-op Pain Management:    Induction: Intravenous  Airway Management Planned: Oral ETT  Additional Equipment:   Intra-op Plan:   Post-operative Plan: Extubation in OR  Informed Consent: I have reviewed the patients History and Physical, chart, labs and discussed the procedure including the risks, benefits and alternatives for the proposed anesthesia with the patient or authorized representative who has indicated his/her understanding and acceptance.   Dental advisory given  Plan Discussed with: CRNA  Anesthesia Plan Comments:         Anesthesia Quick Evaluation

## 2015-08-19 ENCOUNTER — Inpatient Hospital Stay (HOSPITAL_COMMUNITY): Payer: Medicare Other | Admitting: Anesthesiology

## 2015-08-19 ENCOUNTER — Encounter (HOSPITAL_COMMUNITY): Payer: Self-pay | Admitting: *Deleted

## 2015-08-19 ENCOUNTER — Inpatient Hospital Stay (HOSPITAL_COMMUNITY)
Admission: RE | Admit: 2015-08-19 | Discharge: 2015-08-23 | DRG: 331 | Disposition: A | Discharge status: Home / Self Care | Payer: Medicare Other | Source: Ambulatory Visit | Attending: Surgery | Admitting: Surgery

## 2015-08-19 ENCOUNTER — Encounter (HOSPITAL_COMMUNITY)
Admission: RE | Disposition: A | Discharge status: Home / Self Care | Payer: Self-pay | Source: Ambulatory Visit | Attending: Surgery

## 2015-08-19 DIAGNOSIS — I10 Essential (primary) hypertension: Secondary | ICD-10-CM | POA: Diagnosis present

## 2015-08-19 DIAGNOSIS — N3010 Interstitial cystitis (chronic) without hematuria: Secondary | ICD-10-CM | POA: Diagnosis present

## 2015-08-19 DIAGNOSIS — Z90710 Acquired absence of both cervix and uterus: Secondary | ICD-10-CM | POA: Diagnosis not present

## 2015-08-19 DIAGNOSIS — Z87891 Personal history of nicotine dependence: Secondary | ICD-10-CM

## 2015-08-19 DIAGNOSIS — E7800 Pure hypercholesterolemia, unspecified: Secondary | ICD-10-CM | POA: Diagnosis present

## 2015-08-19 DIAGNOSIS — Z79899 Other long term (current) drug therapy: Secondary | ICD-10-CM | POA: Diagnosis not present

## 2015-08-19 DIAGNOSIS — Z86711 Personal history of pulmonary embolism: Secondary | ICD-10-CM

## 2015-08-19 DIAGNOSIS — D122 Benign neoplasm of ascending colon: Principal | ICD-10-CM | POA: Diagnosis present

## 2015-08-19 DIAGNOSIS — Z803 Family history of malignant neoplasm of breast: Secondary | ICD-10-CM | POA: Diagnosis not present

## 2015-08-19 DIAGNOSIS — K219 Gastro-esophageal reflux disease without esophagitis: Secondary | ICD-10-CM | POA: Diagnosis present

## 2015-08-19 DIAGNOSIS — E119 Type 2 diabetes mellitus without complications: Secondary | ICD-10-CM | POA: Diagnosis present

## 2015-08-19 DIAGNOSIS — Z7984 Long term (current) use of oral hypoglycemic drugs: Secondary | ICD-10-CM

## 2015-08-19 DIAGNOSIS — Z8249 Family history of ischemic heart disease and other diseases of the circulatory system: Secondary | ICD-10-CM

## 2015-08-19 DIAGNOSIS — K635 Polyp of colon: Secondary | ICD-10-CM | POA: Diagnosis present

## 2015-08-19 DIAGNOSIS — Z823 Family history of stroke: Secondary | ICD-10-CM

## 2015-08-19 DIAGNOSIS — D120 Benign neoplasm of cecum: Secondary | ICD-10-CM | POA: Diagnosis not present

## 2015-08-19 HISTORY — PX: LAPAROSCOPIC RIGHT COLECTOMY: SHX5925

## 2015-08-19 LAB — TYPE AND SCREEN
ABO/RH(D): A NEG
Antibody Screen: NEGATIVE

## 2015-08-19 LAB — GLUCOSE, CAPILLARY
GLUCOSE-CAPILLARY: 148 mg/dL — AB (ref 65–99)
GLUCOSE-CAPILLARY: 158 mg/dL — AB (ref 65–99)
Glucose-Capillary: 121 mg/dL — ABNORMAL HIGH (ref 65–99)
Glucose-Capillary: 139 mg/dL — ABNORMAL HIGH (ref 65–99)
Glucose-Capillary: 143 mg/dL — ABNORMAL HIGH (ref 65–99)

## 2015-08-19 SURGERY — COLECTOMY, RIGHT, LAPAROSCOPIC
Anesthesia: General | Site: Abdomen | Laterality: Right

## 2015-08-19 MED ORDER — ONDANSETRON HCL 4 MG/2ML IJ SOLN
INTRAMUSCULAR | Status: AC
  Filled 2015-08-19: qty 2

## 2015-08-19 MED ORDER — PHENYLEPHRINE 40 MCG/ML (10ML) SYRINGE FOR IV PUSH (FOR BLOOD PRESSURE SUPPORT)
PREFILLED_SYRINGE | INTRAVENOUS | Status: AC
  Filled 2015-08-19: qty 10

## 2015-08-19 MED ORDER — SUGAMMADEX SODIUM 200 MG/2ML IV SOLN
INTRAVENOUS | Status: AC
  Filled 2015-08-19: qty 2

## 2015-08-19 MED ORDER — DEXTROSE 5 % IV SOLN
2.0000 g | INTRAVENOUS | Status: AC
  Administered 2015-08-19: 2 g via INTRAVENOUS
  Filled 2015-08-19: qty 2

## 2015-08-19 MED ORDER — MIDAZOLAM HCL 5 MG/5ML IJ SOLN
INTRAMUSCULAR | Status: DC | PRN
  Administered 2015-08-19: 2 mg via INTRAVENOUS

## 2015-08-19 MED ORDER — ACETAMINOPHEN 10 MG/ML IV SOLN
1000.0000 mg | Freq: Four times a day (QID) | INTRAVENOUS | Status: AC
  Administered 2015-08-19 – 2015-08-20 (×3): 1000 mg via INTRAVENOUS
  Filled 2015-08-19 (×10): qty 100

## 2015-08-19 MED ORDER — PROPOFOL 10 MG/ML IV BOLUS
INTRAVENOUS | Status: DC | PRN
  Administered 2015-08-19: 200 mg via INTRAVENOUS

## 2015-08-19 MED ORDER — HYDROMORPHONE HCL 1 MG/ML IJ SOLN
0.2500 mg | INTRAMUSCULAR | Status: DC | PRN
  Administered 2015-08-19 (×4): 0.5 mg via INTRAVENOUS

## 2015-08-19 MED ORDER — FENTANYL CITRATE (PF) 250 MCG/5ML IJ SOLN
INTRAMUSCULAR | Status: AC
  Filled 2015-08-19: qty 5

## 2015-08-19 MED ORDER — ONDANSETRON HCL 4 MG/2ML IJ SOLN
4.0000 mg | Freq: Four times a day (QID) | INTRAMUSCULAR | Status: DC | PRN
  Administered 2015-08-19 – 2015-08-21 (×5): 4 mg via INTRAVENOUS
  Filled 2015-08-19 (×5): qty 2

## 2015-08-19 MED ORDER — SUCCINYLCHOLINE CHLORIDE 20 MG/ML IJ SOLN
INTRAMUSCULAR | Status: DC | PRN
Start: 2015-08-19 — End: 2015-08-19
  Administered 2015-08-19: 100 mg via INTRAVENOUS

## 2015-08-19 MED ORDER — METOPROLOL TARTRATE 1 MG/ML IV SOLN: 5.0000 mg | Freq: Three times a day (TID) | INTRAVENOUS | Status: DC | PRN

## 2015-08-19 MED ORDER — HYDROMORPHONE HCL 1 MG/ML IJ SOLN
INTRAMUSCULAR | Status: AC
  Filled 2015-08-19: qty 1

## 2015-08-19 MED ORDER — MEPERIDINE HCL 50 MG/ML IJ SOLN: 6.2500 mg | INTRAMUSCULAR | Status: DC | PRN

## 2015-08-19 MED ORDER — ROCURONIUM BROMIDE 100 MG/10ML IV SOLN
INTRAVENOUS | Status: DC | PRN
  Administered 2015-08-19: 40 mg via INTRAVENOUS
  Administered 2015-08-19: 20 mg via INTRAVENOUS
  Administered 2015-08-19 (×3): 10 mg via INTRAVENOUS
  Administered 2015-08-19: 5 mg via INTRAVENOUS

## 2015-08-19 MED ORDER — ROCURONIUM BROMIDE 100 MG/10ML IV SOLN
INTRAVENOUS | Status: AC
Start: 2015-08-19 — End: 2015-08-19
  Filled 2015-08-19: qty 1

## 2015-08-19 MED ORDER — MIDAZOLAM HCL 2 MG/2ML IJ SOLN
INTRAMUSCULAR | Status: AC
  Filled 2015-08-19: qty 2

## 2015-08-19 MED ORDER — PROPOFOL 10 MG/ML IV BOLUS
INTRAVENOUS | Status: AC
  Filled 2015-08-19: qty 20

## 2015-08-19 MED ORDER — PHENYLEPHRINE 40 MCG/ML (10ML) SYRINGE FOR IV PUSH (FOR BLOOD PRESSURE SUPPORT)
PREFILLED_SYRINGE | INTRAVENOUS | Status: AC
  Filled 2015-08-19: qty 30

## 2015-08-19 MED ORDER — ALVIMOPAN 12 MG PO CAPS
12.0000 mg | ORAL_CAPSULE | Freq: Two times a day (BID) | ORAL | Status: DC
  Administered 2015-08-20 (×2): 12 mg via ORAL
  Filled 2015-08-19 (×6): qty 1

## 2015-08-19 MED ORDER — ONDANSETRON HCL 4 MG/2ML IJ SOLN: 4.0000 mg | Freq: Once | INTRAMUSCULAR | Status: DC | PRN

## 2015-08-19 MED ORDER — SUGAMMADEX SODIUM 200 MG/2ML IV SOLN
INTRAVENOUS | Status: DC | PRN
  Administered 2015-08-19: 200 mg via INTRAVENOUS

## 2015-08-19 MED ORDER — FENTANYL CITRATE (PF) 100 MCG/2ML IJ SOLN
INTRAMUSCULAR | Status: DC | PRN
  Administered 2015-08-19 (×4): 50 ug via INTRAVENOUS

## 2015-08-19 MED ORDER — HYDROCODONE-ACETAMINOPHEN 5-325 MG PO TABS
1.0000 | ORAL_TABLET | ORAL | Status: DC | PRN
  Administered 2015-08-20 (×3): 1 via ORAL
  Administered 2015-08-20 – 2015-08-23 (×13): 2 via ORAL
  Filled 2015-08-19 (×3): qty 2
  Filled 2015-08-19: qty 1
  Filled 2015-08-19 (×6): qty 2
  Filled 2015-08-19: qty 1
  Filled 2015-08-19 (×3): qty 2
  Filled 2015-08-19: qty 1
  Filled 2015-08-19: qty 2

## 2015-08-19 MED ORDER — ROCURONIUM BROMIDE 100 MG/10ML IV SOLN
INTRAVENOUS | Status: AC
  Filled 2015-08-19: qty 1

## 2015-08-19 MED ORDER — LIDOCAINE HCL (CARDIAC) 20 MG/ML IV SOLN
INTRAVENOUS | Status: DC | PRN
  Administered 2015-08-19: 100 mg via INTRAVENOUS

## 2015-08-19 MED ORDER — ONDANSETRON HCL 4 MG PO TABS
4.0000 mg | ORAL_TABLET | Freq: Four times a day (QID) | ORAL | Status: DC | PRN
Start: 2015-08-19 — End: 2015-08-23

## 2015-08-19 MED ORDER — SODIUM CHLORIDE 0.9 % IJ SOLN
INTRAMUSCULAR | Status: AC
  Filled 2015-08-19: qty 20

## 2015-08-19 MED ORDER — MORPHINE SULFATE (PF) 2 MG/ML IV SOLN
1.0000 mg | INTRAVENOUS | Status: DC | PRN
  Administered 2015-08-19: 1 mg via INTRAVENOUS
  Administered 2015-08-19 (×2): 2 mg via INTRAVENOUS
  Administered 2015-08-20: 1 mg via INTRAVENOUS
  Administered 2015-08-20 – 2015-08-21 (×2): 2 mg via INTRAVENOUS
  Filled 2015-08-19 (×6): qty 1

## 2015-08-19 MED ORDER — CEFOTETAN DISODIUM-DEXTROSE 2-2.08 GM-% IV SOLR
INTRAVENOUS | Status: AC
  Filled 2015-08-19: qty 50

## 2015-08-19 MED ORDER — HEPARIN SODIUM (PORCINE) 5000 UNIT/ML IJ SOLN
5000.0000 [IU] | Freq: Three times a day (TID) | INTRAMUSCULAR | Status: DC
  Administered 2015-08-19 – 2015-08-23 (×10): 5000 [IU] via SUBCUTANEOUS
  Filled 2015-08-19 (×16): qty 1

## 2015-08-19 MED ORDER — INSULIN ASPART 100 UNIT/ML ~~LOC~~ SOLN
0.0000 [IU] | SUBCUTANEOUS | Status: DC
Start: 2015-08-19 — End: 2015-08-23
  Administered 2015-08-19 (×2): 2 [IU] via SUBCUTANEOUS
  Administered 2015-08-19: 3 [IU] via SUBCUTANEOUS
  Administered 2015-08-20 (×2): 2 [IU] via SUBCUTANEOUS
  Administered 2015-08-20: 3 [IU] via SUBCUTANEOUS
  Administered 2015-08-21 – 2015-08-22 (×3): 2 [IU] via SUBCUTANEOUS
  Administered 2015-08-22: 3 [IU] via SUBCUTANEOUS
  Administered 2015-08-23: 2 [IU] via SUBCUTANEOUS

## 2015-08-19 MED ORDER — CHLORHEXIDINE GLUCONATE 4 % EX LIQD: 60.0000 mL | Freq: Once | CUTANEOUS | Status: DC

## 2015-08-19 MED ORDER — LACTATED RINGERS IV SOLN
INTRAVENOUS | Status: DC | PRN
  Administered 2015-08-19 (×2): via INTRAVENOUS
  Administered 2015-08-19: 1000 mL
  Administered 2015-08-19: 10:00:00 via INTRAVENOUS

## 2015-08-19 MED ORDER — ONDANSETRON HCL 4 MG/2ML IJ SOLN
INTRAMUSCULAR | Status: DC | PRN
  Administered 2015-08-19: 4 mg via INTRAVENOUS

## 2015-08-19 MED ORDER — BUPIVACAINE LIPOSOME 1.3 % IJ SUSP
20.0000 mL | Freq: Once | INTRAMUSCULAR | Status: AC
  Administered 2015-08-19: 20 mL
  Filled 2015-08-19: qty 20

## 2015-08-19 MED ORDER — ALVIMOPAN 12 MG PO CAPS
12.0000 mg | ORAL_CAPSULE | Freq: Once | ORAL | Status: AC
  Administered 2015-08-19: 12 mg via ORAL
  Filled 2015-08-19: qty 1

## 2015-08-19 MED ORDER — LIDOCAINE HCL (CARDIAC) 20 MG/ML IV SOLN
INTRAVENOUS | Status: AC
  Filled 2015-08-19: qty 5

## 2015-08-19 MED ORDER — POTASSIUM CHLORIDE IN NACL 20-0.45 MEQ/L-% IV SOLN
INTRAVENOUS | Status: DC
  Administered 2015-08-19 – 2015-08-20 (×2): via INTRAVENOUS
  Administered 2015-08-20: 1000 mL via INTRAVENOUS
  Administered 2015-08-20 – 2015-08-21 (×2): via INTRAVENOUS
  Administered 2015-08-21: 1000 mL via INTRAVENOUS
  Administered 2015-08-21 – 2015-08-22 (×3): via INTRAVENOUS
  Filled 2015-08-19 (×11): qty 1000

## 2015-08-19 MED ORDER — PHENYLEPHRINE HCL 10 MG/ML IJ SOLN
INTRAMUSCULAR | Status: DC | PRN
  Administered 2015-08-19 (×3): 40 ug via INTRAVENOUS
  Administered 2015-08-19: 80 ug via INTRAVENOUS
  Administered 2015-08-19 (×2): 40 ug via INTRAVENOUS

## 2015-08-19 SURGICAL SUPPLY — 75 items
APPLIER CLIP 5 13 M/L LIGAMAX5 (MISCELLANEOUS)
APPLIER CLIP ROT 10 11.4 M/L (STAPLE)
APR CLP MED LRG 11.4X10 (STAPLE)
APR CLP MED LRG 5 ANG JAW (MISCELLANEOUS)
BLADE EXTENDED COATED 6.5IN (ELECTRODE) ×2 IMPLANT
CABLE HIGH FREQUENCY MONO STRZ (ELECTRODE) ×2 IMPLANT
CELLS DAT CNTRL 66122 CELL SVR (MISCELLANEOUS) ×1 IMPLANT
CLIP APPLIE 5 13 M/L LIGAMAX5 (MISCELLANEOUS) IMPLANT
CLIP APPLIE ROT 10 11.4 M/L (STAPLE) IMPLANT
COUNTER NEEDLE 20 DBL MAG RED (NEEDLE) ×3 IMPLANT
COVER MAYO STAND STRL (DRAPES) ×9 IMPLANT
COVER SURGICAL LIGHT HANDLE (MISCELLANEOUS) ×6 IMPLANT
CUTTER FLEX LINEAR 45M (STAPLE) IMPLANT
DECANTER SPIKE VIAL GLASS SM (MISCELLANEOUS) ×3 IMPLANT
DRAIN CHANNEL 19F RND (DRAIN) IMPLANT
DRAPE LAPAROSCOPIC ABDOMINAL (DRAPES) ×3 IMPLANT
DRAPE LG THREE QUARTER DISP (DRAPES) IMPLANT
DRSG OPSITE POSTOP 4X10 (GAUZE/BANDAGES/DRESSINGS) IMPLANT
DRSG OPSITE POSTOP 4X6 (GAUZE/BANDAGES/DRESSINGS) ×2 IMPLANT
DRSG OPSITE POSTOP 4X8 (GAUZE/BANDAGES/DRESSINGS) IMPLANT
ELECT PENCIL ROCKER SW 15FT (MISCELLANEOUS) ×6 IMPLANT
ELECT REM PT RETURN 9FT ADLT (ELECTROSURGICAL) ×3
ELECTRODE REM PT RTRN 9FT ADLT (ELECTROSURGICAL) ×1 IMPLANT
EVACUATOR SILICONE 100CC (DRAIN) IMPLANT
FILTER SMOKE EVAC LAPAROSHD (FILTER) IMPLANT
GAUZE SPONGE 4X4 12PLY STRL (GAUZE/BANDAGES/DRESSINGS) ×3 IMPLANT
GLOVE SURG SIGNA 7.5 PF LTX (GLOVE) ×6 IMPLANT
GOWN STRL REUS W/TWL XL LVL3 (GOWN DISPOSABLE) ×12 IMPLANT
LEGGING LITHOTOMY PAIR STRL (DRAPES) IMPLANT
LIGASURE IMPACT 36 18CM CVD LR (INSTRUMENTS) IMPLANT
LIQUID BAND (GAUZE/BANDAGES/DRESSINGS) ×2 IMPLANT
PACK COLON (CUSTOM PROCEDURE TRAY) ×3 IMPLANT
PAD POSITIONING PINK XL (MISCELLANEOUS) IMPLANT
POSITIONER SURGICAL ARM (MISCELLANEOUS) IMPLANT
RELOAD 45 VASCULAR/THIN (ENDOMECHANICALS) IMPLANT
RELOAD PROXIMATE 75MM BLUE (ENDOMECHANICALS) ×6 IMPLANT
RELOAD STAPLE 45 2.5 WHT GRN (ENDOMECHANICALS) IMPLANT
RELOAD STAPLE 45 3.5 BLU ETS (ENDOMECHANICALS) IMPLANT
RELOAD STAPLE 75 3.8 BLU REG (ENDOMECHANICALS) IMPLANT
RELOAD STAPLE TA45 3.5 REG BLU (ENDOMECHANICALS) IMPLANT
RETRACTOR WND ALEXIS 18 MED (MISCELLANEOUS) IMPLANT
RTRCTR WOUND ALEXIS 18CM MED (MISCELLANEOUS) ×3
SET IRRIG TUBING LAPAROSCOPIC (IRRIGATION / IRRIGATOR) ×3 IMPLANT
SHEARS HARMONIC ACE PLUS 36CM (ENDOMECHANICALS) ×3 IMPLANT
SLEEVE XCEL OPT CAN 5 100 (ENDOMECHANICALS) ×4 IMPLANT
SPONGE LAP 18X18 X RAY DECT (DISPOSABLE) IMPLANT
STAPLER PROXIMATE 75MM BLUE (STAPLE) ×2 IMPLANT
STAPLER VISISTAT 35W (STAPLE) IMPLANT
SUT ETHILON 2 0 PS N (SUTURE) IMPLANT
SUT MNCRL AB 4-0 PS2 18 (SUTURE) ×3 IMPLANT
SUT NOVA NAB DX-16 0-1 5-0 T12 (SUTURE) IMPLANT
SUT PDS AB 1 CTX 36 (SUTURE) ×4 IMPLANT
SUT PROLENE 2 0 KS (SUTURE) IMPLANT
SUT PROLENE 2 0 SH DA (SUTURE) IMPLANT
SUT SILK 2 0 (SUTURE) ×3
SUT SILK 2 0 SH CR/8 (SUTURE) ×3 IMPLANT
SUT SILK 2-0 18XBRD TIE 12 (SUTURE) ×1 IMPLANT
SUT SILK 3 0 (SUTURE) ×3
SUT SILK 3 0 SH CR/8 (SUTURE) ×7 IMPLANT
SUT SILK 3-0 18XBRD TIE 12 (SUTURE) ×1 IMPLANT
SUT VIC AB 2-0 SH 18 (SUTURE) ×2 IMPLANT
SUT VIC AB 2-0 SH 27 (SUTURE)
SUT VIC AB 2-0 SH 27X BRD (SUTURE) IMPLANT
SUT VIC AB 3-0 SH 18 (SUTURE) ×4 IMPLANT
SYS LAPSCP GELPORT 120MM (MISCELLANEOUS)
SYSTEM LAPSCP GELPORT 120MM (MISCELLANEOUS) IMPLANT
TAPE CLOTH 4X10 WHT NS (GAUZE/BANDAGES/DRESSINGS) IMPLANT
TOWEL OR 17X26 10 PK STRL BLUE (TOWEL DISPOSABLE) ×3 IMPLANT
TRAY FOLEY W/METER SILVER 14FR (SET/KITS/TRAYS/PACK) ×2 IMPLANT
TROCAR ADV FIXATION 5X100MM (TROCAR) ×2 IMPLANT
TROCAR BLADELESS OPT 5 100 (ENDOMECHANICALS) ×3 IMPLANT
TROCAR XCEL 12X100 BLDLESS (ENDOMECHANICALS) IMPLANT
TROCAR XCEL BLUNT TIP 100MML (ENDOMECHANICALS) IMPLANT
TROCAR XCEL NON-BLD 11X100MML (ENDOMECHANICALS) IMPLANT
TUBING INSUF HEATED (TUBING) ×3 IMPLANT

## 2015-08-19 NOTE — Anesthesia Procedure Notes (Signed)
Procedure Name: Intubation Date/Time: 08/19/2015 7:19 AM Performed by: Lind Covert Pre-anesthesia Checklist: Patient identified, Emergency Drugs available, Suction available, Patient being monitored and Timeout performed Patient Re-evaluated:Patient Re-evaluated prior to inductionOxygen Delivery Method: Circle system utilized Preoxygenation: Pre-oxygenation with 100% oxygen Intubation Type: IV induction Laryngoscope Size: Mac Grade View: Grade I Tube type: Oral Tube size: 7.0 mm Number of attempts: 1 Airway Equipment and Method: Stylet Placement Confirmation: ETT inserted through vocal cords under direct vision,  positive ETCO2 and breath sounds checked- equal and bilateral Secured at: 21 cm Tube secured with: Tape Dental Injury: Teeth and Oropharynx as per pre-operative assessment

## 2015-08-19 NOTE — Transfer of Care (Signed)
Immediate Anesthesia Transfer of Care Note  Patient: Heather [name]  Procedure(s) Performed: Procedure(s): LAPAROSCOPIC ASSISTED  RIGHT COLECTOMY (Right)  Patient Location: PACU  Anesthesia Type:General  Level of Consciousness: sedated  Airway & Oxygen Therapy: Patient Spontanous Breathing and Patient connected to face mask oxygen  Post-op Assessment: Report given to RN and Post -op Vital signs reviewed and stable  Post vital signs: Reviewed and stable  Last Vitals:  Filed Vitals:   08/19/15 0515 08/19/15 0550  BP: 139/102 106/66  Pulse: 94   Temp: 36.7 C   Resp: 18     Complications: No apparent anesthesia complications

## 2015-08-19 NOTE — Anesthesia Postprocedure Evaluation (Signed)
Anesthesia Post Note  Patient: Heather [name]  Procedure(s) Performed: Procedure(s) (LRB): LAPAROSCOPIC ASSISTED  RIGHT COLECTOMY (Right)  Patient location during evaluation: PACU Anesthesia Type: General Level of consciousness: sedated and patient cooperative Pain management: pain level controlled Vital Signs Assessment: post-procedure vital signs reviewed and stable Respiratory status: spontaneous breathing Cardiovascular status: stable Anesthetic complications: no    Last Vitals:  Filed Vitals:   08/19/15 1345 08/19/15 1410  BP: 129/72 130/73  Pulse: 83 88  Temp: 37 C 37 C  Resp: 14 16    Last Pain:  Filed Vitals:   08/19/15 1410  PainSc: [place]

## 2015-08-19 NOTE — Interval H&P Note (Signed)
History and Physical Interval Note:  08/19/2015 7:11 AM  Heather [name]  has presented today for surgery, with the diagnosis of right colonic polyp  The various methods of treatment have been discussed with the patient and family. Her daughter is taking children to school and will be here later.  After consideration of risks, benefits and other options for treatment, the patient has consented to  Procedure(s): LAPAROSCOPIC ASSISTED  RIGHT COLECTOMY (Right) as a surgical intervention .  The patient's history has been reviewed, patient examined, no change in status, stable for surgery.  I have reviewed the patient's chart and labs.  Questions were answered to the patient's satisfaction.     Iaan Oregel H

## 2015-08-20 LAB — BASIC METABOLIC PANEL
ANION GAP: 6 (ref 5–15)
BUN: 12 mg/dL (ref 6–20)
CALCIUM: 8.1 mg/dL — AB (ref 8.9–10.3)
CO2: 27 mmol/L (ref 22–32)
Chloride: 100 mmol/L — ABNORMAL LOW (ref 101–111)
Creatinine, Ser: 0.89 mg/dL (ref 0.44–1.00)
GFR calc Af Amer: >60 mL/min (ref >60)
GFR calc non Af Amer: >60 mL/min (ref >60)
GLUCOSE: 160 mg/dL — AB (ref 65–99)
Potassium: 3.9 mmol/L (ref 3.5–5.1)
Sodium: 133 mmol/L — ABNORMAL LOW (ref 135–145)

## 2015-08-20 LAB — CBC
HCT: 31.8 % — ABNORMAL LOW (ref 36.0–46.0)
HEMOGLOBIN: 10.8 g/dL — AB (ref 12.0–15.0)
MCH: 26.3 pg (ref 26.0–34.0)
MCHC: 34 g/dL (ref 30.0–36.0)
MCV: 77.6 fL — ABNORMAL LOW (ref 78.0–100.0)
Platelets: 275 10*3/uL (ref 150–400)
RBC: 4.1 MIL/uL (ref 3.87–5.11)
RDW: 15.5 % (ref 11.5–15.5)
WBC: 9 10*3/uL (ref 4.0–10.5)

## 2015-08-20 LAB — GLUCOSE, CAPILLARY
GLUCOSE-CAPILLARY: 136 mg/dL — AB (ref 65–99)
GLUCOSE-CAPILLARY: 119 mg/dL — AB (ref 65–99)
Glucose-Capillary: 170 mg/dL — ABNORMAL HIGH (ref 65–99)
Glucose-Capillary: 130 mg/dL — ABNORMAL HIGH (ref 65–99)
Glucose-Capillary: 114 mg/dL — ABNORMAL HIGH (ref 65–99)

## 2015-08-20 NOTE — Op Note (Signed)
NAMEJESSALYN, LEVINE NO.:  [id number]  MEDICAL RECORD NO.:  [id number]  LOCATION:  [id number]                         FACILITY:  [facility]  PHYSICIAN:  Fenton Malling. Lucia Gaskins, M.D.  DATE OF BIRTH:  October 20, 1950  DATE OF PROCEDURE:  08/19/2015                               OPERATIVE REPORT   PREOPERATIVE DIAGNOSIS:  Adenomatous polyp of right colon.  POSTOPERATIVE DIAGNOSIS:  Adenomatous polyp of right colon.  PROCEDURE:  Laparoscopic-assisted right hemicolectomy.  SURGEON:  Fenton Malling. Lucia Gaskins, M.D.  FIRST ASSISTANT:  Dr. Redmond Pulling.  ANESTHESIA:  General endotracheal supervised Dr. Nolon Nations.  Local anesthetic was 20 mL of Exparel mixed with 20 mL of saline.  ESTIMATED BLOOD LOSS:  Less than 100 mL.  DRAINS:  Left in were none.  SPECIMEN:  Right colon.  COUNTS:  Count was correct at end of procedure.  INDICATION FOR PROCEDURE:  Ms. Lyon is a 65 year old white female who is a patient of Dr. Billey Gosling who underwent a colonoscopy by Dr. Earlie Raveling and was found to have a sessile polyp of her cecum.  Biopsy showed tubular adenomas, but the polyp was not amenable to easy colonoscopic resection.  There was discussion by going to a medical center for excision, but she decided to proceed with the surgical excision of a right colon to remove this polyp.  The indications and potential complications of colon surgery were explained to the patient.  Potential complications include, but not limited to, bleeding, infection, bowel leak, and nerve injury.  She completed a mechanical and antibiotic bowel prep at home.  DESCRIPTION OF PROCEDURE:  The patient was placed in a supine position in room #4 [facility], underwent a general endotracheal anesthetic.  She was given 2 g of cefotetan at the initiation of procedure.  She had have her arms tucked.  A Foley catheter in place. PAS stockings in place.  The time-out was held and surgical checklist run.  Abdomen was prepped with  ChloraPrep and sterilely draped.  I accessed her abdominal cavity with a left upper quadrant 5 mm Optiview.  I placed 3 additional trocars: in left mid abdomen a 5 mm trocar, in left lower quadrant a 5 mm trocar, and in the right lower quadrant a 5 mm trocar.   I carried out abdominal exploration.  She had 1 adhesive band to her midline.  She had some adhesions along her right mid and right colon to the anterior peritoneal surface.  She had a prior appendectomy, actually had surprisingly few adhesions related to this.  The remainder of her bowel that I could see was unremarkable.  Both lobes of liver were unremarkable.  Gallbladder was the Robin's egg blue and unremarkable. Stomach was unremarkable.  I then proceeded to mobilize the right colon which was fairly mobile to start with.  I identified the right ureter and this was kept posterior dissection.  I swept the right colon medially up to the duodenum as I was able to expose the entire duodenum and mobilize the right transverse colon over to the ligament of Treitz.  So with the colon mobile where I thought I could do my anastomosis extracorporeal, I then made a  6 cm midline incision just above the umbilicus into the abdominal cavity and retrieved the right colon.  I then resected the right colon from the terminal ileum to the right transverse colon.  I took down the mesentery about 8 cm.  I thought I could feel the mass through the wall of the right colon.  There was no evidence of any malignancy externally.  I divided the distal terminal ileum with a 75 GIA.  I divided the right transverse colon with a 75 GIA, divided the mesentery with a combination of Kelly clamps, and the Harmonic Scalpel.  I ligated the clamps with 2-0 silk sutures.    I then took the specimen, opened it on the bedside.  There was about a 3 cm sessile polyp near the ileocecal valve.  It did not grossly looked malignant.  I saw no other clear abnormalities.  She did have a problem  with the ileocecal valve.  This was sent for final pathology.  I then carried out my anastomosis, but enterotomy in the right transverse colon, enterotomy in the distal ileum and did a side-to-side anastomosis with a 75 GIA stapler.  I closed the open enterotomy with interrupted 3-0 silk sutures.  There was at least a 5 cm plus opening between the terminal ileum and right colon.  I placed a stitch in the crotch of the staple line and I closed over the end of the cut terminal ileum of right colon.  I closed the mesentery with interrupted 2-0 Vicryl sutures.  I then returned the bowel to the abdominal cavity.  We changed gloves, gown, and redraped the wound.  I irrigated the abdominal cavity with 3 L of saline.  I closed the fascia with 2 running #1 PDS sutures.  I infiltrated 40 mL of Exparel, using for about 30 mL around the fascia tried to create a fascial block.  The rest was placed in the skin.  I then re-insufflated the patient and did a laparoscopic exploration, which showed no evidence of any active bleeding.  I aspirated out some further fluid, irrigated with another liter of fluid intra-abdominally. I then removed the trocars in turn, I closed with some 3-0 Vicryl sutures and subcutaneous suture to the midline wound with a 4-0 Monocryl suture, painted each wound with Dermabond.  The patient was transported to the recovery room in good condition.  Sponge and needle  count were correct at the end of the case.  I did not leave an NG tube in her.    She tolerated the procedure well.  She was transferred to the recovery room in good condition.     Fenton Malling. Lucia Gaskins, M.D., [facility], scribe for Epic     DHN/MEDQ  D:  08/19/2015  T:  08/20/2015  Job:  [id number]  cc:   Mayme Genta, M.D. Fax: [phone]  Dr. Billey Gosling

## 2015-08-20 NOTE — Progress Notes (Signed)
[place] [facility] Office:  [phone] General Surgery Progress Note   LOS: 1 day  POD -  1 Day Post-Op  Assessment/Plan: 1.  Right colonic polyp  LAPAROSCOPIC ASSISTED  RIGHT COLECTOMY - 08/19/2015 - D. Nashonda Limberg  Nauseated last PM - questionably secondary to the morphine, better this AM.  She says that she is hungry.  Will let have ice chips.  2. Fatigue 3. DM x 2 years   On pills  Glucose - 160 - 08/19/4025 4. HTN 5. Hyperlipidemia. 6. History of heart cath - 02/2002 - Dr. Ocie Doyne no coronary art disease she said that she has a murmur. 7. History of GERD. 8. Interstitial cystitis 9.  DVT prophylaxis - SQ heparin   Active Problems:   Sessile colonic polyp   Subjective:  Doing better today.  Nauseated and vomited twice last PM.  Wants some coffee.  Objective:   Filed Vitals:   08/20/15 0220 08/20/15 0600  BP: 116/62 118/64  Pulse: 72 65  Temp: 98.6 F (37 C) 99 F (37.2 C)  Resp: 18 18     Intake/Output from previous day:  04/03 0701 - 04/04 0700 In: 4764.6 [I.V.:4764.6] Out: [id number] [Urine:1275; Emesis/NG output:100; Blood:50]  Intake/Output this shift:      Physical Exam:   General: WN older WF who is alert and oriented.    HEENT: Normal. Pupils equal. .   Lungs: clear.  Pulled 1,500 cc on IS - but made her cough   Abdomen: Soft.  Quiet.   Wound: Clean.    Lab Results:    Recent Labs  08/20/15 0413  WBC 9.0  HGB 10.8*  HCT 31.8*  PLT 275    BMET   Recent Labs  08/20/15 0413  NA 133*  K 3.9  CL 100*  CO2 27  GLUCOSE 160*  BUN 12  CREATININE 0.89  CALCIUM 8.1*    PT/INR  No results for input(s): LABPROT, INR in the last 72 hours.  ABG  No results for input(s): PHART, HCO3 in the last 72 hours.  Invalid input(s): PCO2, PO2   Studies/Results:  No results found.   Anti-infectives:   Anti-infectives    Start     Dose/Rate Route Frequency Ordered Stop   08/19/15 0515  cefoTEtan (CEFOTAN)  2 g in dextrose 5 % 50 mL IVPB     2 g 100 mL/hr over 30 Minutes Intravenous On call to O.R. 08/19/15 0515 08/19/15 [id number]      Alphonsa Overall, MD, FACS Pager: [place] [facility] Office: [phone] 08/20/2015

## 2015-08-21 LAB — CBC WITH DIFFERENTIAL/PLATELET
BASOS PCT: 0 %
Basophils Absolute: 0 10*3/uL (ref 0.0–0.1)
EOS ABS: 0.1 10*3/uL (ref 0.0–0.7)
EOS PCT: 1 %
HCT: 29.7 % — ABNORMAL LOW (ref 36.0–46.0)
HEMOGLOBIN: 10 g/dL — AB (ref 12.0–15.0)
LYMPHS ABS: 1.1 10*3/uL (ref 0.7–4.0)
Lymphocytes Relative: 13 %
MCH: 25.5 pg — AB (ref 26.0–34.0)
MCHC: 33.7 g/dL (ref 30.0–36.0)
MCV: 75.8 fL — ABNORMAL LOW (ref 78.0–100.0)
MONOS PCT: 9 %
Monocytes Absolute: 0.8 10*3/uL (ref 0.1–1.0)
Neutro Abs: 6.5 10*3/uL (ref 1.7–7.7)
Neutrophils Relative %: 77 %
PLATELETS: 254 10*3/uL (ref 150–400)
RBC: 3.92 MIL/uL (ref 3.87–5.11)
RDW: 15.2 % (ref 11.5–15.5)
WBC: 8.5 10*3/uL (ref 4.0–10.5)

## 2015-08-21 LAB — BASIC METABOLIC PANEL
ANION GAP: 8 (ref 5–15)
BUN: 7 mg/dL (ref 6–20)
CHLORIDE: 100 mmol/L — AB (ref 101–111)
CO2: 22 mmol/L (ref 22–32)
CREATININE: 0.66 mg/dL (ref 0.44–1.00)
Calcium: 8.3 mg/dL — ABNORMAL LOW (ref 8.9–10.3)
GFR calc non Af Amer: >60 mL/min (ref >60)
Glucose, Bld: 138 mg/dL — ABNORMAL HIGH (ref 65–99)
Potassium: 4.2 mmol/L (ref 3.5–5.1)
SODIUM: 130 mmol/L — AB (ref 135–145)

## 2015-08-21 LAB — GLUCOSE, CAPILLARY
GLUCOSE-CAPILLARY: 100 mg/dL — AB (ref 65–99)
GLUCOSE-CAPILLARY: 108 mg/dL — AB (ref 65–99)
GLUCOSE-CAPILLARY: 123 mg/dL — AB (ref 65–99)
GLUCOSE-CAPILLARY: 128 mg/dL — AB (ref 65–99)
Glucose-Capillary: 119 mg/dL — ABNORMAL HIGH (ref 65–99)
Glucose-Capillary: 113 mg/dL — ABNORMAL HIGH (ref 65–99)
Glucose-Capillary: 113 mg/dL — ABNORMAL HIGH (ref 65–99)

## 2015-08-21 NOTE — Progress Notes (Signed)
[place] [facility] Office:  [phone] General Surgery Progress Note   LOS: 2 days  POD -  2 Days Post-Op  Assessment/Plan: 1.  Right colonic polyp  LAPAROSCOPIC ASSISTED  RIGHT COLECTOMY - 08/19/2015 - D. Arnet Hofferber  Doing okay.  Will start clear liquids.  2. Fatigue 3. DM x 2 years   On pills  Glucose - 138 - 08/20/4025 4. HTN 5. Hyperlipidemia. 6. History of heart cath - 02/2002 - Dr. Ocie Doyne no coronary art disease she said that she has a murmur. 7. History of GERD. 8. Interstitial cystitis 9.  DVT prophylaxis - SQ heparin   Active Problems:   Sessile colonic polyp   Subjective:  She has passed some flatus.  Still sore.  Ready to try some po's.  Objective:   Filed Vitals:   08/20/15 2112 08/21/15 0603  BP: 119/60 127/65  Pulse: 80 74  Temp: 98.7 F (37.1 C) 98 F (36.7 C)  Resp: 18 16     Intake/Output from previous day:  04/04 0701 - 04/05 0700 In: 2947.9 [I.V.:2947.9] Out: 1150 [Urine:1150]  Intake/Output this shift:      Physical Exam:   General: WN older WF who is alert and oriented.    HEENT: Normal. Pupils equal. .   Lungs: clear.  Pulled 1,400 cc on IS.   Abdomen: Soft.  Rare BS.   Wound: Clean.    Lab Results:     Recent Labs  08/20/15 0413 08/21/15 0409  WBC 9.0 8.5  HGB 10.8* 10.0*  HCT 31.8* 29.7*  PLT 275 254    BMET    Recent Labs  08/20/15 0413 08/21/15 0409  NA 133* 130*  K 3.9 4.2  CL 100* 100*  CO2 27 22  GLUCOSE 160* 138*  BUN 12 7  CREATININE 0.89 0.66  CALCIUM 8.1* 8.3*    PT/INR  No results for input(s): LABPROT, INR in the last 72 hours.  ABG  No results for input(s): PHART, HCO3 in the last 72 hours.  Invalid input(s): PCO2, PO2   Studies/Results:  No results found.   Anti-infectives:   Anti-infectives    Start     Dose/Rate Route Frequency Ordered Stop   08/19/15 0515  cefoTEtan (CEFOTAN) 2 g in dextrose 5 % 50 mL IVPB     2 g 100 mL/hr over 30  Minutes Intravenous On call to O.R. 08/19/15 0515 08/19/15 [id number]      Alphonsa Overall, MD, FACS Pager: [place] [facility] Office: [phone] 08/21/2015

## 2015-08-22 LAB — GLUCOSE, CAPILLARY
GLUCOSE-CAPILLARY: 147 mg/dL — AB (ref 65–99)
Glucose-Capillary: 97 mg/dL (ref 65–99)
Glucose-Capillary: 117 mg/dL — ABNORMAL HIGH (ref 65–99)
Glucose-Capillary: 160 mg/dL — ABNORMAL HIGH (ref 65–99)
Glucose-Capillary: 113 mg/dL — ABNORMAL HIGH (ref 65–99)

## 2015-08-22 NOTE — Progress Notes (Signed)
[place] [facility] Office:  [phone] General Surgery Progress Note   LOS: 3 days  POD -  3 Days Post-Op  Assessment/Plan: 1.  Right colonic polyp  LAPAROSCOPIC ASSISTED  RIGHT COLECTOMY - 08/19/2015 - D. [facility]  Pathology - 2 benign adenomas, 0/18 nodes.  Advance to full liquids.    2. Fatigue 3. DM x 2 years   On pills  Glucose - 138 - 08/20/4025 4. HTN 5. Hyperlipidemia. 6. History of heart cath - 02/2002 - Dr. Ocie Doyne no coronary art disease she said that she has a murmur. 7. History of GERD. 8. Interstitial cystitis 9.  DVT prophylaxis - SQ heparin   Active Problems:   Sessile colonic polyp  Subjective:  Has had some small BM.  Doing okay with full liquids.  Wants to try more.  Objective:   Filed Vitals:   08/21/15 2050 08/22/15 0551  BP: 130/61 127/58  Pulse: 79 89  Temp: 98.2 F (36.8 C) 98.6 F (37 C)  Resp: 18 16     Intake/Output from previous day:  04/05 0701 - 04/06 0700 In: 2858.3 [P.O.:720; I.V.:2138.3] Out: 2925 [Urine:2925]  Intake/Output this shift:  Total I/O In: -  Out: 1000 [Urine:1000]   Physical Exam:   General: WN older WF who is alert and oriented.    HEENT: Normal. Pupils equal. .   Lungs: clear.     Abdomen: Soft.  Few BS.   Wound: Clean.    Lab Results:     Recent Labs  08/20/15 0413 08/21/15 0409  WBC 9.0 8.5  HGB 10.8* 10.0*  HCT 31.8* 29.7*  PLT 275 254    BMET    Recent Labs  08/20/15 0413 08/21/15 0409  NA 133* 130*  K 3.9 4.2  CL 100* 100*  CO2 27 22  GLUCOSE 160* 138*  BUN 12 7  CREATININE 0.89 0.66  CALCIUM 8.1* 8.3*    PT/INR  No results for input(s): LABPROT, INR in the last 72 hours.  ABG  No results for input(s): PHART, HCO3 in the last 72 hours.  Invalid input(s): PCO2, PO2   Studies/Results:  No results found.   Anti-infectives:   Anti-infectives    Start     Dose/Rate Route Frequency Ordered Stop   08/19/15 0515  cefoTEtan  (CEFOTAN) 2 g in dextrose 5 % 50 mL IVPB     2 g 100 mL/hr over 30 Minutes Intravenous On call to O.R. 08/19/15 0515 08/19/15 [id number]      Alphonsa Overall, MD, FACS Pager: [place] [facility] Office: [phone] 08/22/2015

## 2015-08-22 NOTE — Care Management Important Message (Signed)
Important Message  Patient Details  Name: Heather [name] MRN: [id number] Date of Birth: [birth year]   Medicare Important Message Given:  Yes    Camillo Flaming 08/22/2015, 10:31 AMImportant Message  Patient Details  Name: Heather [name] MRN: [id number] Date of Birth: 02/19/1951   Medicare Important Message Given:  Yes    Camillo Flaming 08/22/2015, 10:31 AM

## 2015-08-23 ENCOUNTER — Telehealth: Payer: Self-pay | Admitting: *Deleted

## 2015-08-23 LAB — GLUCOSE, CAPILLARY
GLUCOSE-CAPILLARY: 103 mg/dL — AB (ref 65–99)
Glucose-Capillary: 119 mg/dL — ABNORMAL HIGH (ref 65–99)
Glucose-Capillary: 139 mg/dL — ABNORMAL HIGH (ref 65–99)

## 2015-08-23 MED ORDER — HYDROCODONE-ACETAMINOPHEN 5-325 MG PO TABS: 1.0000 | ORAL_TABLET | Freq: Four times a day (QID) | ORAL | Status: DC | PRN

## 2015-08-23 NOTE — Telephone Encounter (Signed)
Pt was on TCM list admitted for (R) colon sessile adenomas. Had a Laparoscopic (R) colectomy on 4/3. Pt is going to f/u w/Dr. Obie Dredge in 2 wks...Johny Chess

## 2015-08-23 NOTE — Discharge Instructions (Signed)
[facility] [place] [facility] - DISCHARGE INSTRUCTIONS TO PATIENT   Activity:  Driving - May drive in 3 or 4 days, if doing well   Lifting - No lifting more than 15 pounds for 2 weeks, then no limit  Wound Care:   May shower  Diet:  As tolereated  Follow up appointment:  Call Dr. Pollie Friar office [facility]) at [phone] for an appointment in 3 to 4 weeks.  Medications and dosages:  Resume your home medications.  You have a prescription for:  Vicodin  Call Dr. Lucia Gaskins or his office  [phone]) if you have:  Temperature greater than 100.4,  Persistent nausea and vomiting,  Severe uncontrolled pain,  Redness, tenderness, or signs of infection (pain, swelling, redness, odor or green/yellow discharge around the site),  Difficulty breathing, headache or visual disturbances,  Any other questions or concerns you may have after discharge.  In an emergency, call 911 or go to an Emergency Department at a nearby hospital.

## 2015-08-23 NOTE — Discharge Summary (Signed)
Physician Discharge Summary  Patient ID:  Heather [name]  MRN: [id number]  DOB/AGE: 18-Oct-1950 65 y.o.  Admit date: 08/19/2015 Discharge date: 08/23/2015  Discharge Diagnoses:  1.  Right colon sessile tubular adenomas (3.0 and 2.4 cm), both benign, 0/18 nodes  2. Fatigue 3. DM x 2 years On pills 4. HTN 5. Hyperlipidemia. 6. History of heart cath - 02/2002 - Dr. Ocie Doyne no coronary art disease she said that she has a murmur. 7. History of GERD. 8. Interstitial cystitis   Active Problems:   Sessile colonic polyp  Operation: Procedure(s):  LAPAROSCOPIC ASSISTED  RIGHT COLECTOMY on 08/19/2015  Discharged Condition: good  Hospital Course: Heather [name] is an 65 y.o. female whose primary care physician is Binnie Rail, MD and who was admitted 08/19/2015 with a chief complaint of right colon polyps.  The patient had a colonoscopy by Dr. Earlie Raveling on 13 June 2015 and she was found to have a large cecal polyp and a large flat sessile polyp of the ascending colon. The path report showed tubular adenomas.  There was discussion about attempted colonoscopic removal at a medical center vs right colectomy.  She decided on proceeding with right colecotmy  She was brought to the operating room on 08/19/2015 and underwent  LAPAROSCOPIC ASSISTED  RIGHT COLECTOMY.  She did well with her post op course.  She had bowel function by the second post op day and she was started on a diet. She is now 4 days post op. She is tolerating diet well and is ready to go home.  The discharge instructions were reviewed with the patient.  Consults: None  Significant Diagnostic Studies: Results for orders placed or performed during the hospital encounter of 08/19/15  Glucose, capillary  Result Value Ref Range   Glucose-Capillary 143 (H) 65 - 99 mg/dL   Comment 1 Notify RN    Comment 2 Document in Chart   Glucose, capillary  Result Value Ref Range   Glucose-Capillary 148 (H) 65 - 99 mg/dL   Comment 1 Notify RN    Comment 2 Document in Chart   Basic metabolic panel  Result Value Ref Range   Sodium 133 (L) 135 - 145 mmol/L   Potassium 3.9 3.5 - 5.1 mmol/L   Chloride 100 (L) 101 - 111 mmol/L   CO2 27 22 - 32 mmol/L   Glucose, Bld 160 (H) 65 - 99 mg/dL   BUN 12 6 - 20 mg/dL   Creatinine, Ser 0.89 0.44 - 1.00 mg/dL   Calcium 8.1 (L) 8.9 - 10.3 mg/dL   GFR calc non Af Amer >60 >60 mL/min   GFR calc Af Amer >60 >60 mL/min   Anion gap 6 5 - 15  CBC  Result Value Ref Range   WBC 9.0 4.0 - 10.5 K/uL   RBC 4.10 3.87 - 5.11 MIL/uL   Hemoglobin 10.8 (L) 12.0 - 15.0 g/dL   HCT 31.8 (L) 36.0 - 46.0 %   MCV 77.6 (L) 78.0 - 100.0 fL   MCH 26.3 26.0 - 34.0 pg   MCHC 34.0 30.0 - 36.0 g/dL   RDW 15.5 11.5 - 15.5 %   Platelets 275 150 - 400 K/uL  Glucose, capillary  Result Value Ref Range   Glucose-Capillary 158 (H) 65 - 99 mg/dL  Glucose, capillary  Result Value Ref Range   Glucose-Capillary 121 (H) 65 - 99 mg/dL  Glucose, capillary  Result Value Ref Range   Glucose-Capillary 139 (H) 65 - 99  mg/dL  Glucose, capillary  Result Value Ref Range   Glucose-Capillary 136 (H) 65 - 99 mg/dL  Glucose, capillary  Result Value Ref Range   Glucose-Capillary 170 (H) 65 - 99 mg/dL  CBC with Differential/Platelet  Result Value Ref Range   WBC 8.5 4.0 - 10.5 K/uL   RBC 3.92 3.87 - 5.11 MIL/uL   Hemoglobin 10.0 (L) 12.0 - 15.0 g/dL   HCT 29.7 (L) 36.0 - 46.0 %   MCV 75.8 (L) 78.0 - 100.0 fL   MCH 25.5 (L) 26.0 - 34.0 pg   MCHC 33.7 30.0 - 36.0 g/dL   RDW 15.2 11.5 - 15.5 %   Platelets 254 150 - 400 K/uL   Neutrophils Relative % 77 %   Neutro Abs 6.5 1.7 - 7.7 K/uL   Lymphocytes Relative 13 %   Lymphs Abs 1.1 0.7 - 4.0 K/uL   Monocytes Relative 9 %   Monocytes Absolute 0.8 0.1 - 1.0 K/uL   Eosinophils Relative 1 %   Eosinophils Absolute 0.1 0.0 - 0.7 K/uL   Basophils Relative 0 %   Basophils Absolute 0.0 0.0 - 0.1 K/uL  Glucose,  capillary  Result Value Ref Range   Glucose-Capillary 130 (H) 65 - 99 mg/dL  Glucose, capillary  Result Value Ref Range   Glucose-Capillary 119 (H) 65 - 99 mg/dL  Basic metabolic panel  Result Value Ref Range   Sodium 130 (L) 135 - 145 mmol/L   Potassium 4.2 3.5 - 5.1 mmol/L   Chloride 100 (L) 101 - 111 mmol/L   CO2 22 22 - 32 mmol/L   Glucose, Bld 138 (H) 65 - 99 mg/dL   BUN 7 6 - 20 mg/dL   Creatinine, Ser 0.66 0.44 - 1.00 mg/dL   Calcium 8.3 (L) 8.9 - 10.3 mg/dL   GFR calc non Af Amer >60 >60 mL/min   GFR calc Af Amer >60 >60 mL/min   Anion gap 8 5 - 15  Glucose, capillary  Result Value Ref Range   Glucose-Capillary 114 (H) 65 - 99 mg/dL  Glucose, capillary  Result Value Ref Range   Glucose-Capillary 123 (H) 65 - 99 mg/dL  Glucose, capillary  Result Value Ref Range   Glucose-Capillary 119 (H) 65 - 99 mg/dL  Glucose, capillary  Result Value Ref Range   Glucose-Capillary 113 (H) 65 - 99 mg/dL  Glucose, capillary  Result Value Ref Range   Glucose-Capillary 113 (H) 65 - 99 mg/dL  Glucose, capillary  Result Value Ref Range   Glucose-Capillary 100 (H) 65 - 99 mg/dL  Glucose, capillary  Result Value Ref Range   Glucose-Capillary 128 (H) 65 - 99 mg/dL  Glucose, capillary  Result Value Ref Range   Glucose-Capillary 108 (H) 65 - 99 mg/dL  Glucose, capillary  Result Value Ref Range   Glucose-Capillary 97 65 - 99 mg/dL  Glucose, capillary  Result Value Ref Range   Glucose-Capillary 117 (H) 65 - 99 mg/dL  Glucose, capillary  Result Value Ref Range   Glucose-Capillary 160 (H) 65 - 99 mg/dL  Glucose, capillary  Result Value Ref Range   Glucose-Capillary 113 (H) 65 - 99 mg/dL  Glucose, capillary  Result Value Ref Range   Glucose-Capillary 147 (H) 65 - 99 mg/dL  Glucose, capillary  Result Value Ref Range   Glucose-Capillary 103 (H) 65 - 99 mg/dL  Glucose, capillary  Result Value Ref Range   Glucose-Capillary 119 (H) 65 - 99 mg/dL    No results found.  Discharge  Exam:  Filed Vitals:   08/22/15 2046 08/23/15 0527  BP: 140/69 153/81  Pulse: 80 70  Temp: 97.8 F (36.6 C) 98.6 F (37 C)  Resp: 18 15    General: WN older WF who is alert and generally healthy appearing.  Lungs: Clear to auscultation and symmetric breath sounds. Heart:  RRR. No murmur or rub. Abdomen: Soft. No mass.  Normal bowel sounds.  Her incisions look good.  Discharge Medications:     Medication List    STOP taking these medications        metroNIDAZOLE 500 MG tablet  Commonly known as:  FLAGYL     neomycin 500 MG tablet  Commonly known as:  MYCIFRADIN      TAKE these medications        ANTACID ADVANCED PO  Take 1 tablet by mouth daily as needed (heartburn).     [employer] BLOOD GLUCOSE METER w/Device Kit  Use to test blood sugar twice daily ICD 10 E11 9     [employer] LANCETS THIN Misc  Use to test blood sugar twice daily ICD 19 E11 9     ELMIRON 100 MG capsule  Generic drug:  pentosan polysulfate  Take 100 mg by mouth 3 (three) times daily.     GAS-X PO  Take 1 tablet by mouth daily as needed (gas.).     glucose blood test strip  Commonly known as:  [employer] BLOOD GLUCOSE TEST STRIPS  Use to test blood sugar twice daily ICD 10 E11 9     hydrochlorothiazide 12.5 MG capsule  Commonly known as:  MICROZIDE  Take 1 capsule (12.5 mg total) by mouth daily.     HYDROcodone-acetaminophen 5-325 MG tablet  Commonly known as:  NORCO/VICODIN  Take 1-2 tablets by mouth every 6 (six) hours as needed for moderate pain.     IRON PO  Take 65 mg by mouth 2 (two) times daily.     losartan 100 MG tablet  Commonly known as:  COZAAR  Take 1 tablet (100 mg total) by mouth daily.     metFORMIN 500 MG 24 hr tablet  Commonly known as:  GLUCOPHAGE-XR  Take 1 tablet (500 mg total) by mouth 2 (two) times daily with a meal.     multivitamin with minerals Tabs tablet  Take by mouth every morning.     ondansetron 4 MG disintegrating tablet  Commonly known as:  ZOFRAN-ODT  Take 4 mg  by mouth every 8 (eight) hours as needed for nausea or vomiting.     ranitidine 150 MG tablet  Commonly known as:  ZANTAC  Take 150 mg by mouth daily.     URIBEL 118 MG Caps  Take 1 capsule by mouth 2 (two) times daily as needed.        Disposition:       Discharge Instructions    Diet - low sodium heart healthy    Complete by:  As directed      Increase activity slowly    Complete by:  As directed            Activity:  Driving - May drive in 3 or 4 days, if doing well   Lifting - No lifting more than 15 pounds for 2 weeks, then no limit  Wound Care:   May shower  Diet:  As tolereated  Follow up appointment:  Call Dr. Pollie Friar office [facility]) at [phone] for an appointment in 3 to 4 weeks.  Medications and dosages:  Resume your home medications.  You have a prescription for:  Vicodin   Signed: Alphonsa Overall, M.D., [facility] Office:  [phone]  08/23/2015, 7:19 AM

## 2015-08-23 NOTE — Progress Notes (Signed)
Discharge instructions discussed with patient and daughter. Able to answer questions about when to call md. Am assessment unchanged except iv discontinued. Prescription given for norco and instructed not to take with tylenol -explained norco contains tylenol to take either or, but not both

## 2015-09-10 ENCOUNTER — Ambulatory Visit (INDEPENDENT_AMBULATORY_CARE_PROVIDER_SITE_OTHER): Payer: Medicare Other | Admitting: Family

## 2015-09-10 ENCOUNTER — Other Ambulatory Visit (INDEPENDENT_AMBULATORY_CARE_PROVIDER_SITE_OTHER): Payer: Medicare Other

## 2015-09-10 VITALS — BP 122/92 | HR 81 | Temp 97.9°F | Ht 62.0 in | Wt 188.0 lb

## 2015-09-10 DIAGNOSIS — E1149 Type 2 diabetes mellitus with other diabetic neurological complication: Secondary | ICD-10-CM

## 2015-09-10 DIAGNOSIS — R202 Paresthesia of skin: Secondary | ICD-10-CM

## 2015-09-10 LAB — COMPREHENSIVE METABOLIC PANEL
ALBUMIN: 4.3 g/dL (ref 3.5–5.2)
ALK PHOS: 50 U/L (ref 39–117)
ALT: 19 U/L (ref 0–35)
AST: 14 U/L (ref 0–37)
BILIRUBIN TOTAL: 0.8 mg/dL (ref 0.2–1.2)
BUN: 15 mg/dL (ref 6–23)
CO2: 30 mEq/L (ref 19–32)
Calcium: 10.1 mg/dL (ref 8.4–10.5)
Chloride: 96 mEq/L (ref 96–112)
Creatinine, Ser: 0.81 mg/dL (ref 0.40–1.20)
GFR: 74.97 mL/min (ref >60.00)
Glucose, Bld: 125 mg/dL — ABNORMAL HIGH (ref 70–99)
POTASSIUM: 4.1 meq/L (ref 3.5–5.1)
Sodium: 135 mEq/L (ref 135–145)
TOTAL PROTEIN: 7.2 g/dL (ref 6.0–8.3)

## 2015-09-10 LAB — B12 AND FOLATE PANEL
Folate: >23.7 ng/mL (ref >5.9)
Vitamin B-12: 618 pg/mL (ref 211–911)

## 2015-09-10 LAB — HEMOGLOBIN A1C: Hgb A1c MFr Bld: 7 % — ABNORMAL HIGH (ref 4.6–6.5)

## 2015-09-10 MED ORDER — PREGABALIN 50 MG PO CAPS: 50.0000 mg | ORAL_CAPSULE | Freq: Three times a day (TID) | ORAL | Status: DC

## 2015-09-10 NOTE — Patient Instructions (Addendum)
Labs on her way out. Make appointment for 1 month follow-up to see how medication is doing. Continue to monitor blood sugars.  If there is no improvement in your symptoms, or if there is any worsening of symptoms, or if you have any additional concerns, please return for re-evaluation; or, if we are closed, consider going to the Emergency Room for evaluation if symptoms urgent.

## 2015-09-10 NOTE — Progress Notes (Signed)
Pre visit review using our clinic review tool, if applicable. No additional management support is needed unless otherwise documented below in the visit note. 

## 2015-09-10 NOTE — Progress Notes (Signed)
Subjective:    Patient ID: Heather [name], female    DOB: [birth year], 65 y.o.   MRN: [id number]   Heather [name] is a 65 y.o. female who presents today for an acute visit.    HPI Comments: Abdominal surgery to remove portion of colon 3 weeks ago and 'metformin not working anymore.' Colonoscopy revealed enormous polyp which has now been removed. Incision is healing well. CLD leading up to surgery and then in the hospital for 5 days and only had had chips for first 2 days, and then transitioned to liquids. Endorses loss of appetite. Also complains of trouble sleeping , complains of legs tingling and numb which is worse at nighttime. Has tried Bengay and heating pad with no relief.   Take Metformin XR 500 mg BID.   Fasting BS 137.   Past Medical History  Diagnosis Date  . Hypertension   . Interstitial cystitis     Dr Janice Norrie  . Heart murmur   . Shortness of breath dyspnea     "due to low hemaglobin"  . Colon polyps   . GERD (gastroesophageal reflux disease)   . History of hiatal hernia   . Anemia   . History of transfusion   . History of DVT (deep vein thrombosis) 1970'S  . Diabetes mellitus without complication ([place])   . Environmental allergies    Flagyl; Lisinopril; and Sulfonamide derivatives Current Outpatient Prescriptions on File Prior to Visit  Medication Sig Dispense Refill  . Alum & Mag Hydroxide-Simeth (ANTACID ADVANCED PO) Take 1 tablet by mouth daily as needed (heartburn).     . Blood Glucose Monitoring Suppl (CVS BLOOD GLUCOSE METER) W/DEVICE KIT Use to test blood sugar twice daily ICD 10 E11 9 1 kit 0  . CVS LANCETS THIN MISC Use to test blood sugar twice daily ICD 19 E11 9 100 each 3  . glucose blood (CVS BLOOD GLUCOSE TEST STRIPS) test strip Use to test blood sugar twice daily ICD 10 E11 9 100 each 3  . hydrochlorothiazide (MICROZIDE) 12.5 MG capsule Take 1 capsule (12.5 mg total) by mouth daily. (Patient taking differently: Take 12.5 mg by mouth at bedtime. ) 90  capsule 3  . IRON PO Take 65 mg by mouth 2 (two) times daily.    Marland Kitchen losartan (COZAAR) 100 MG tablet Take 1 tablet (100 mg total) by mouth daily. 90 tablet 3  . metFORMIN (GLUCOPHAGE-XR) 500 MG 24 hr tablet Take 1 tablet (500 mg total) by mouth 2 (two) times daily with a meal. 180 tablet 3  . Meth-Hyo-M Bl-Na Phos-Ph Sal (URIBEL) 118 MG CAPS Take 1 capsule by mouth 2 (two) times daily as needed.  3  . Multiple Vitamin (MULTIVITAMIN WITH MINERALS) TABS tablet Take by mouth every morning.    . pentosan polysulfate (ELMIRON) 100 MG capsule Take 100 mg by mouth 3 (three) times daily.    . ranitidine (ZANTAC) 150 MG tablet Take 150 mg by mouth daily.     . Simethicone (GAS-X PO) Take 1 tablet by mouth daily as needed (gas.).     Marland Kitchen HYDROcodone-acetaminophen (NORCO/VICODIN) 5-325 MG tablet Take 1-2 tablets by mouth every 6 (six) hours as needed for moderate pain. (Patient not taking: Reported on 09/10/2015) 30 tablet 0  . ondansetron (ZOFRAN-ODT) 4 MG disintegrating tablet Take 4 mg by mouth every 8 (eight) hours as needed for nausea or vomiting. Reported on 09/10/2015     No current facility-administered medications on file prior to visit.  Social History  Substance Use Topics  . Smoking status: Former Smoker    Quit date: 05/18/1984  . Smokeless tobacco: Not on file     Comment: smoked 1972-1986, up to 1 ppd  . Alcohol Use: No    Review of Systems  Constitutional: Positive for appetite change. Negative for fever, chills (loss of appetite) and unexpected weight change.  HENT: Negative for congestion, ear pain, sinus pressure and sore throat.   Eyes: Negative for visual disturbance.  Respiratory: Negative for cough, shortness of breath and wheezing.   Cardiovascular: Negative for chest pain, palpitations and leg swelling.  Gastrointestinal: Negative for nausea and vomiting.  Endocrine: Negative for polydipsia, polyphagia and polyuria.  Genitourinary: Negative for frequency.  Musculoskeletal:  Negative for myalgias.  Neurological: Positive for numbness (BLE). Negative for headaches.      Objective:    BP 122/92 mmHg  Pulse 81  Temp(Src) 97.9 F (36.6 C) (Oral)  Ht _0  (1.575 m)  Wt 188 lb (85.276 kg)  BMI 34.38 kg/m2  SpO2 98%   Physical Exam  Constitutional: She appears well-developed and well-nourished.  Eyes: Conjunctivae are normal.  Cardiovascular: Normal rate, regular rhythm and normal pulses.   Murmur heard. Palpable pedal pulses. No LE edema.   Pulmonary/Chest: Effort normal and breath sounds normal. She has no wheezes. She has no rhonchi. She has no rales.  Abdominal:    Well approximated, nondraining incisions on  abdomen as noted on diagram.  Neurological: She is alert. She has normal strength. No sensory deficit.  Slight decrease in tactile sensation noted BLE.    Skin: Skin is warm and dry.  Psychiatric: She has a normal mood and affect. Her speech is normal and behavior is normal. Thought content normal.  Vitals reviewed.      Assessment & Plan:   1. Other diabetic neurological complication associated with type 2 diabetes mellitus (HCC) Trial of Lyrica for DM neuropathy.Last A1C 7.3 on 3/17. We jointly decided not to augment her diabetic regimen at this time as she has poor appetite since surgery and we're concerned for AE of hypoglycemia.  - pregabalin (LYRICA) 50 MG capsule; Take 1 capsule (50 mg total) by mouth 3 (three) times daily.  Dispense: 90 capsule; Refill: 2 - Hemoglobin A1c; Future - B12 and Folate Panel; Future - Ambulatory referral to Endocrinology- patient requested.  - Comprehensive metabolic panel; Future  2. Paresthesia of bilateral legs - B12 and Folate Panel; Future  I am having Ms. Googe maintain her CVS BLOOD GLUCOSE METER, CVS LANCETS THIN, glucose blood, pentosan polysulfate, IRON PO, ranitidine, Simethicone (GAS-X PO), Alum & Mag Hydroxide-Simeth (ANTACID ADVANCED PO), losartan, hydrochlorothiazide, metFORMIN,  URIBEL, multivitamin with minerals, ondansetron, and HYDROcodone-acetaminophen.   Start medications as prescribed and explained to patient on After Visit Summary ( AVS). Risks, benefits, and alternatives of the medications and treatment plan prescribed today were discussed, and patient expressed understanding.   Education regarding symptom management and diagnosis given to patient.   Follow-up:Plan follow-up as discussed or as needed if any worsening symptoms or change in condition. ONE MONTH  Continue to follow with Binnie Rail, MD for routine health maintenance.   [name] and I agreed with plan.   Mable Paris, FNP   25 minutes spent with patient. > 50% of time spent counseling patient related to DM neuropathy diagnosis.

## 2015-10-01 ENCOUNTER — Encounter: Payer: Self-pay | Admitting: Internal Medicine

## 2015-10-07 ENCOUNTER — Other Ambulatory Visit: Payer: Self-pay | Admitting: Internal Medicine

## 2015-10-07 DIAGNOSIS — Z803 Family history of malignant neoplasm of breast: Secondary | ICD-10-CM | POA: Diagnosis not present

## 2015-10-07 DIAGNOSIS — Z1231 Encounter for screening mammogram for malignant neoplasm of breast: Secondary | ICD-10-CM | POA: Diagnosis not present

## 2015-10-07 LAB — HM MAMMOGRAPHY

## 2015-10-08 ENCOUNTER — Encounter: Payer: Self-pay | Admitting: Internal Medicine

## 2015-10-09 ENCOUNTER — Ambulatory Visit (INDEPENDENT_AMBULATORY_CARE_PROVIDER_SITE_OTHER): Payer: Medicare Other | Admitting: Internal Medicine

## 2015-10-09 ENCOUNTER — Other Ambulatory Visit (INDEPENDENT_AMBULATORY_CARE_PROVIDER_SITE_OTHER): Payer: Medicare Other

## 2015-10-09 ENCOUNTER — Encounter: Payer: Self-pay | Admitting: Internal Medicine

## 2015-10-09 VITALS — BP 128/88 | HR 84 | Temp 98.4°F | Resp 16 | Wt 189.0 lb

## 2015-10-09 DIAGNOSIS — D649 Anemia, unspecified: Secondary | ICD-10-CM | POA: Diagnosis not present

## 2015-10-09 DIAGNOSIS — I10 Essential (primary) hypertension: Secondary | ICD-10-CM | POA: Diagnosis not present

## 2015-10-09 DIAGNOSIS — E119 Type 2 diabetes mellitus without complications: Secondary | ICD-10-CM

## 2015-10-09 DIAGNOSIS — R202 Paresthesia of skin: Secondary | ICD-10-CM

## 2015-10-09 DIAGNOSIS — R252 Cramp and spasm: Secondary | ICD-10-CM

## 2015-10-09 DIAGNOSIS — R200 Anesthesia of skin: Secondary | ICD-10-CM

## 2015-10-09 LAB — CBC WITH DIFFERENTIAL/PLATELET
BASOS ABS: 0 10*3/uL (ref 0.0–0.1)
Basophils Relative: 0.4 % (ref 0.0–3.0)
Eosinophils Absolute: 0.2 10*3/uL (ref 0.0–0.7)
Eosinophils Relative: 2.1 % (ref 0.0–5.0)
HEMATOCRIT: 37.9 % (ref 36.0–46.0)
HEMOGLOBIN: 12.5 g/dL (ref 12.0–15.0)
LYMPHS PCT: 25.4 % (ref 12.0–46.0)
Lymphs Abs: 1.9 10*3/uL (ref 0.7–4.0)
MCHC: 33.1 g/dL (ref 30.0–36.0)
MCV: 79 fl (ref 78.0–100.0)
MONOS PCT: 8.5 % (ref 3.0–12.0)
Monocytes Absolute: 0.6 10*3/uL (ref 0.1–1.0)
NEUTROS PCT: 63.6 % (ref 43.0–77.0)
Neutro Abs: 4.7 10*3/uL (ref 1.4–7.7)
Platelets: 447 10*3/uL — ABNORMAL HIGH (ref 150.0–400.0)
RBC: 4.79 Mil/uL (ref 3.87–5.11)
RDW: 14.6 % (ref 11.5–15.5)
WBC: 7.4 10*3/uL (ref 4.0–10.5)

## 2015-10-09 LAB — TSH: TSH: 1.59 u[IU]/mL (ref 0.35–4.50)

## 2015-10-09 LAB — IRON: Iron: 53 ug/dL (ref 42–145)

## 2015-10-09 LAB — FERRITIN: Ferritin: 13.7 ng/mL (ref 10.0–291.0)

## 2015-10-09 LAB — MAGNESIUM: MAGNESIUM: 1.9 mg/dL (ref 1.5–2.5)

## 2015-10-09 MED ORDER — GABAPENTIN 100 MG PO CAPS: ORAL_CAPSULE | ORAL | Status: DC

## 2015-10-09 NOTE — Assessment & Plan Note (Signed)
Check cbc, iron levels

## 2015-10-09 NOTE — Assessment & Plan Note (Signed)
a1c well controlled Continue current dose of metformin Work on increasing activity, decreasing portions and weight loss

## 2015-10-09 NOTE — Patient Instructions (Addendum)
  Test(s) ordered today. Your results will be released to [place] (or called to you) after review, usually within 72hours after test completion. If any changes need to be made, you will be notified at that same time.   Medications reviewed and updated.  Changes include stopping the lyrica and starting gabapentin at night, which we can adjust the dose as needed.  Your prescription(s) have been submitted to your pharmacy. Please take as directed and contact our office if you believe you are having problem(s) with the medication(s).  A referral was ordered for neurology

## 2015-10-09 NOTE — Progress Notes (Signed)
Subjective:    Patient ID: Heather [name], female    DOB: 1950/08/05, 65 y.o.   MRN: [id number]  HPI She is here for follow up.     She had surgery 08/19/15 - right hemicolectomy to remove a large colon polyp.  She is recovering well - she is trying to figure out her new normal bowel movements.   Diabetes: She is taking her medication daily as prescribed. She is compliant with a diabetic diet. Her recent a1c was 7.0.    Tingling and numbness in legs, worse at night: This started several months ago, but it has gotten worse.  She was taking advil at night.  She was here one month ago and saw Joycelyn Schmid. She has numbness in all of her toes.  She has tingling and a prickly feeling in her lower legs up to her knees.  Some days are better than others.  It is intermittent during the day, but constant and worse at night.  She feels it during the day when she sits down.  She has tried bengay, cream for restless leg and nothing helps. She was started on Lyrica for possible diabetic neuropathy.  She started taking it just at night.  She tried increasing it and it would make her too tired during the day.  It has helped but does not last all night.  She feels restless at night.  Her a1c at that time was 7.0, her B12 and folate level are normal.    Hypertension: She is taking her medication daily. She is compliant with a low sodium diet.  She denies chest pain, palpitations, shortness of breath and regular headaches.  She does have some leg edema.      Chronic back pain:  When it flares she has to sit down, which helps.  She does not have constant lower back pain.  She has not had her back evaluated.  She is very frustrated by her weight and inability to lose weight.  She has an appointment with Dr Loanne Drilling.   Medications and allergies reviewed with patient and updated if appropriate.  Patient Active Problem List   Diagnosis Date Noted  . Sessile colonic polyp 08/19/2015  . Hiatal hernia, large  08/02/2015  . Colon polyps 08/02/2015  . Anemia, unspecified 04/21/2015  . Diabetes type 2, controlled ([place]) 05/03/2014  . Hyperlipidemia, mixed 03/18/2014  . Epistaxis 09/06/2013  . Headache(784.0) 09/06/2013  . Lymphadenopathy 08/22/2012  . Interstitial cystitis 07/08/2012  . Essential hypertension 02/10/2011    Current Outpatient Prescriptions on File Prior to Visit  Medication Sig Dispense Refill  . Alum & Mag Hydroxide-Simeth (ANTACID ADVANCED PO) Take 1 tablet by mouth daily as needed (heartburn).     . Blood Glucose Monitoring Suppl (CVS BLOOD GLUCOSE METER) W/DEVICE KIT Use to test blood sugar twice daily ICD 10 E11 9 1 kit 0  . CVS LANCETS THIN MISC Use to test blood sugar twice daily ICD 19 E11 9 100 each 3  . glucose blood (CVS ADVANCED GLUCOSE TEST) test strip Test blood sugar twice daily. DX E11.9 100 each 1  . hydrochlorothiazide (MICROZIDE) 12.5 MG capsule Take 1 capsule (12.5 mg total) by mouth daily. (Patient taking differently: Take 12.5 mg by mouth at bedtime. ) 90 capsule 3  . IRON PO Take 65 mg by mouth 2 (two) times daily.    Marland Kitchen losartan (COZAAR) 100 MG tablet Take 1 tablet (100 mg total) by mouth daily. 90 tablet 3  . metFORMIN (  GLUCOPHAGE-XR) 500 MG 24 hr tablet Take 1 tablet (500 mg total) by mouth 2 (two) times daily with a meal. 180 tablet 3  . Meth-Hyo-M Bl-Na Phos-Ph Sal (URIBEL) 118 MG CAPS Take 1 capsule by mouth 2 (two) times daily as needed.  3  . Multiple Vitamin (MULTIVITAMIN WITH MINERALS) TABS tablet Take by mouth every morning.    . pentosan polysulfate (ELMIRON) 100 MG capsule Take 100 mg by mouth 3 (three) times daily.    . pregabalin (LYRICA) 50 MG capsule Take 1 capsule (50 mg total) by mouth 3 (three) times daily. 90 capsule 2  . ranitidine (ZANTAC) 150 MG tablet Take 150 mg by mouth daily.     . Simethicone (GAS-X PO) Take 1 tablet by mouth daily as needed (gas.).      No current facility-administered medications on file prior to visit.     Past Medical History  Diagnosis Date  . Hypertension   . Interstitial cystitis     Dr Janice Norrie  . Heart murmur   . Shortness of breath dyspnea     "due to low hemaglobin"  . Colon polyps   . GERD (gastroesophageal reflux disease)   . History of hiatal hernia   . Anemia   . History of transfusion   . History of DVT (deep vein thrombosis) 1970'S  . Diabetes mellitus without complication ([place])   . Environmental allergies     Past Surgical History  Procedure Laterality Date  . Cystoscopy      X 4; Dr Janice Norrie  . Appendectomy    . Total abdominal hysterectomy      metromenorrhagia  . Bilateral salpingoophorectomy      painful cysts  . Colonoscopy  2005    negative; Dr Earlean Shawl. F/U declined  . Laparoscopic right colectomy Right 08/19/2015    Procedure: LAPAROSCOPIC ASSISTED  RIGHT COLECTOMY;  Surgeon: Alphonsa Overall, MD;  Location: [facility];  Service: General;  Laterality: Right;    Social History   Social History  . Marital Status: Widowed    Spouse Name: N/A  . Number of Children: N/A  . Years of Education: N/A   Social History Main Topics  . Smoking status: Former Smoker    Quit date: 05/18/1984  . Smokeless tobacco: None     Comment: smoked 1972-1986, up to 1 ppd  . Alcohol Use: No  . Drug Use: No  . Sexual Activity: Not Asked   Other Topics Concern  . None   Social History Narrative    Family History  Problem Relation Age of Onset  . Heart attack Mother     in 41s  . Hypertension Mother   . Stroke Mother      in 93s  . Stroke Maternal Grandfather 50  . Diabetes Neg Hx   . Heart attack Paternal Grandfather     in 69s  . Breast cancer Sister     Review of Systems  Constitutional: Negative for fever.  Respiratory: Negative for shortness of breath.   Cardiovascular: Positive for leg swelling. Negative for chest pain and palpitations.  Gastrointestinal: Negative for abdominal pain.  Musculoskeletal: Positive for back pain (chronic).       Muscle cramping  at night  Neurological: Positive for weakness and numbness. Negative for headaches.       Objective:   Filed Vitals:   10/09/15 0838  BP: 128/88  Pulse: 84  Temp: 98.4 F (36.9 C)  Resp: 16   Filed Weights   10/09/15  1146  Weight: 189 lb (85.73 kg)   Body mass index is 34.56 kg/(m^2).   Physical Exam Constitutional: Appears well-developed and well-nourished. No distress.  Neck: Neck supple. No tracheal deviation present. No thyromegaly present.  No carotid bruit. No cervical adenopathy.   Cardiovascular: Normal rate, regular rhythm and normal heart sounds.   No murmur heard.  No edema Pulmonary/Chest: Effort normal and breath sounds normal. No respiratory distress. No wheezes.  Neuro: normal sensation in lower legs, toes not examined SKin: no skin changes in lower legs       Assessment & Plan:   See Problem List for Assessment and Plan of chronic medical problems.

## 2015-10-09 NOTE — Progress Notes (Signed)
Pre visit review using our clinic review tool, if applicable. No additional management support is needed unless otherwise documented below in the visit note. 

## 2015-10-09 NOTE — Assessment & Plan Note (Signed)
Neuropathy vs radiculopathy Her diabetes has been well controlled so neuropathy seems less likely - possible radiculopathy, ? Spinal stenosis Recommended neuro referral, EMG, xray of lower back - she deferred testing at this time, but I did refer her just in case if takes a while for an appt with neuro D/c lyrica - she is having too many side effects Start gabapentin at 200 mg nightly - can titrate if needed

## 2015-10-09 NOTE — Assessment & Plan Note (Signed)
bp controlled today Continue current medication 

## 2015-10-10 ENCOUNTER — Encounter: Payer: Self-pay | Admitting: Internal Medicine

## 2015-10-13 ENCOUNTER — Encounter: Payer: Self-pay | Admitting: Internal Medicine

## 2015-10-30 ENCOUNTER — Encounter: Payer: Self-pay | Admitting: Internal Medicine

## 2015-10-30 ENCOUNTER — Ambulatory Visit (INDEPENDENT_AMBULATORY_CARE_PROVIDER_SITE_OTHER): Payer: Medicare Other | Admitting: Endocrinology

## 2015-10-30 ENCOUNTER — Encounter: Payer: Self-pay | Admitting: Endocrinology

## 2015-10-30 VITALS — BP 120/80 | HR 78 | Temp 98.2°F | Ht 62.0 in | Wt 191.4 lb

## 2015-10-30 DIAGNOSIS — E1142 Type 2 diabetes mellitus with diabetic polyneuropathy: Secondary | ICD-10-CM | POA: Diagnosis not present

## 2015-10-30 MED ORDER — SITAGLIPTIN PHOSPHATE 100 MG PO TABS: 100.0000 mg | ORAL_TABLET | Freq: Every day | ORAL | Status: DC

## 2015-10-30 MED ORDER — METFORMIN HCL ER 500 MG PO TB24: 500.0000 mg | ORAL_TABLET | Freq: Every day | ORAL | Status: DC

## 2015-10-30 NOTE — Progress Notes (Signed)
Subjective:    Patient ID: Heather [name], female    DOB: 12-15-1950, 65 y.o.   MRN: [id number]  HPI pt states DM was dx'ed in 2015; she has moderate neuropathy of the lower extremities, and assoc pain; she has never been on insulin; pt says her diet and exercise are not good; she has never had GDM, pancreatitis, severe hypoglycemia or DKA. She says the metformin-XR causes diarrhea, which she attributes to short bowel syndrome.  Past Medical History  Diagnosis Date  . Hypertension   . Interstitial cystitis     Dr Janice Norrie  . Heart murmur   . Shortness of breath dyspnea     "due to low hemaglobin"  . Colon polyps   . GERD (gastroesophageal reflux disease)   . History of hiatal hernia   . Anemia   . History of transfusion   . History of DVT (deep vein thrombosis) 1970'S  . Diabetes mellitus without complication ([place])   . Environmental allergies     Past Surgical History  Procedure Laterality Date  . Cystoscopy      X 4; Dr Janice Norrie  . Appendectomy    . Total abdominal hysterectomy      metromenorrhagia  . Bilateral salpingoophorectomy      painful cysts  . Colonoscopy  2005    negative; Dr Earlean Shawl. F/U declined  . Laparoscopic right colectomy Right 08/19/2015    Procedure: LAPAROSCOPIC ASSISTED  RIGHT COLECTOMY;  Surgeon: Alphonsa Overall, MD;  Location: [facility];  Service: General;  Laterality: Right;  . Premaglinant polyps      Social History   Social History  . Marital Status: Widowed    Spouse Name: N/A  . Number of Children: N/A  . Years of Education: N/A   Occupational History  . Not on file.   Social History Main Topics  . Smoking status: Former Smoker    Quit date: 05/18/1984  . Smokeless tobacco: Not on file     Comment: smoked 1972-1986, up to 1 ppd  . Alcohol Use: No  . Drug Use: No  . Sexual Activity: Not on file   Other Topics Concern  . Not on file   Social History Narrative    Current Outpatient Prescriptions on File Prior to Visit  Medication Sig  Dispense Refill  . Alum & Mag Hydroxide-Simeth (ANTACID ADVANCED PO) Take 1 tablet by mouth daily as needed (heartburn).     . Blood Glucose Monitoring Suppl (CVS BLOOD GLUCOSE METER) W/DEVICE KIT Use to test blood sugar twice daily ICD 10 E11 9 1 kit 0  . CVS LANCETS THIN MISC Use to test blood sugar twice daily ICD 19 E11 9 100 each 3  . gabapentin (NEURONTIN) 100 MG capsule Take 2 pills at nighttime (Patient taking differently: Take 100 mg by mouth 3 (three) times daily. Take 2 pills at nighttime) 180 capsule 3  . glucose blood (CVS ADVANCED GLUCOSE TEST) test strip Test blood sugar twice daily. DX E11.9 100 each 1  . hydrochlorothiazide (MICROZIDE) 12.5 MG capsule Take 1 capsule (12.5 mg total) by mouth daily. (Patient taking differently: Take 12.5 mg by mouth at bedtime. ) 90 capsule 3  . IRON PO Take 65 mg by mouth every other day.    . losartan (COZAAR) 100 MG tablet Take 1 tablet (100 mg total) by mouth daily. 90 tablet 3  . Meth-Hyo-M Bl-Na Phos-Ph Sal (URIBEL) 118 MG CAPS Take 1 capsule by mouth 2 (two) times daily as needed.  3  . Multiple Vitamin (MULTIVITAMIN WITH MINERALS) TABS tablet Take by mouth every morning.    . pentosan polysulfate (ELMIRON) 100 MG capsule Take 100 mg by mouth 3 (three) times daily.    . ranitidine (ZANTAC) 150 MG tablet Take 150 mg by mouth daily.     . Simethicone (GAS-X PO) Take 1 tablet by mouth daily as needed (gas.).      No current facility-administered medications on file prior to visit.    Allergies  Allergen Reactions  . Flagyl [Metronidazole] Other (See Comments)    Made really sick. Was able to tolerate dose 4/17 with zofran  . Lisinopril     04/03/15 cough reported X several months  . Sulfonamide Derivatives     nausea    Family History  Problem Relation Age of Onset  . Heart attack Mother     in 104s  . Hypertension Mother   . Stroke Mother      in 1s  . Stroke Maternal Grandfather 50  . Diabetes Neg Hx   . Heart attack Paternal  Grandfather     in 28s  . Breast cancer Sister     BP 120/80 mmHg  Pulse 78  Temp(Src) 98.2 F (36.8 C)  Ht '5\' 2"'  (1.575 m)  Wt 191 lb 6.4 oz (86.818 kg)  BMI 35.00 kg/m2  SpO2 97%   Review of Systems denies blurry vision, headache, chest pain, n/v, urinary frequency, excessive diaphoresis, depression, cold intolerance, and rhinorrhea.  She has fatigue, doe, leg cramps, easy bruising, and weight gain.      Objective:   Physical Exam VS: see vs page GEN: no distress HEAD: head: no deformity eyes: no periorbital swelling, no proptosis external nose and ears are normal mouth: no lesion seen NECK: supple, thyroid is not enlarged CHEST WALL: no deformity.  LUNGS: clear to auscultation CV: reg rate and rhythm, no murmur ABD: abdomen is soft, nontender.  no hepatosplenomegaly.  not distended.  no hernia MUSCULOSKELETAL: muscle bulk and strength are grossly normal.  no obvious joint swelling.  gait is normal and steady EXTEMITIES: no deformity.  no ulcer on the feet.  feet are of normal color and temp.  no edema PULSES: dorsalis pedis intact bilat.  no carotid bruit NEURO:  cn 2-12 grossly intact.   readily moves all 4's.  sensation is intact to touch on the feet, but decreased from normal. SKIN:  Normal texture and temperature.  No rash or suspicious lesion is visible.   NODES:  None palpable at the neck PSYCH: alert, well-oriented.  Does not appear anxious nor depressed.   Lab Results  Component Value Date   HGBA1C 7.0* 09/10/2015    i personally reviewed electrocardiogram tracing (11/30/14): Indication: sob Impression: normal    Assessment & Plan:  Type 2 DM: she needs increased rx, if it can be done with a regimen that avoids or minimizes hypoglycemia.  Short bowel syndrome: sxs may be exac by metformin.   Weight gain: new to me.    Patient is advised the following: Patient Instructions  good diet and exercise significantly improve the control of your diabetes.   please let me know if you wish to be referred to a dietician.  high blood sugar is very risky to your health.  you should see an eye doctor and dentist every year.  It is very important to get all recommended vaccinations.  controlling your blood pressure and cholesterol drastically reduces the damage diabetes does to your  body.  Those who smoke should quit.  please discuss these with your doctor.  check your blood sugar once a day.  vary the time of day when you check, between before the 3 meals, and at bedtime.  also check if you have symptoms of your blood sugar being too high or too low.  please keep a record of the readings and bring it to your next appointment here (or you can bring the meter itself).  You can write it on any piece of paper.  please call us sooner if your blood sugar goes below 70, or if you have a lot of readings over 200. Please see a dietician specialist.  Today, we'll get you an appointment.  i have sent 2 prescriptions to your pharmacy: to reduce the metformin, and add [place].  Please come back for a follow-up appointment in 3 months.    Renato Shin, MD

## 2015-10-30 NOTE — Patient Instructions (Addendum)
good diet and exercise significantly improve the control of your diabetes.  please let me know if you wish to be referred to a dietician.  high blood sugar is very risky to your health.  you should see an eye doctor and dentist every year.  It is very important to get all recommended vaccinations.  controlling your blood pressure and cholesterol drastically reduces the damage diabetes does to your body.  Those who smoke should quit.  please discuss these with your doctor.  check your blood sugar once a day.  vary the time of day when you check, between before the 3 meals, and at bedtime.  also check if you have symptoms of your blood sugar being too high or too low.  please keep a record of the readings and bring it to your next appointment here (or you can bring the meter itself).  You can write it on any piece of paper.  please call us sooner if your blood sugar goes below 70, or if you have a lot of readings over 200. Please see a dietician specialist.  Today, we'll get you an appointment.  i have sent 2 prescriptions to your pharmacy: to reduce the metformin, and add [place].  Please come back for a follow-up appointment in 3 months.

## 2015-11-21 ENCOUNTER — Encounter: Payer: Self-pay | Admitting: Internal Medicine

## 2015-11-23 MED ORDER — GABAPENTIN 100 MG PO CAPS: 400.0000 mg | ORAL_CAPSULE | Freq: Every day | ORAL | Status: DC

## 2015-11-27 ENCOUNTER — Encounter: Payer: Self-pay | Admitting: Neurology

## 2015-11-27 ENCOUNTER — Ambulatory Visit (INDEPENDENT_AMBULATORY_CARE_PROVIDER_SITE_OTHER): Payer: Medicare Other | Admitting: Neurology

## 2015-11-27 VITALS — BP 130/90 | HR 72 | Ht 62.0 in | Wt 189.2 lb

## 2015-11-27 DIAGNOSIS — M79641 Pain in right hand: Secondary | ICD-10-CM

## 2015-11-27 DIAGNOSIS — M545 Low back pain: Secondary | ICD-10-CM

## 2015-11-27 DIAGNOSIS — M79642 Pain in left hand: Secondary | ICD-10-CM | POA: Diagnosis not present

## 2015-11-27 DIAGNOSIS — E0842 Diabetes mellitus due to underlying condition with diabetic polyneuropathy: Secondary | ICD-10-CM | POA: Diagnosis not present

## 2015-11-27 DIAGNOSIS — M7918 Myalgia, other site: Secondary | ICD-10-CM

## 2015-11-27 MED ORDER — GABAPENTIN 300 MG PO CAPS: 600.0000 mg | ORAL_CAPSULE | Freq: Every day | ORAL | Status: DC

## 2015-11-27 MED ORDER — TIZANIDINE HCL 2 MG PO TABS: 2.0000 mg | ORAL_TABLET | Freq: Every evening | ORAL | Status: DC | PRN

## 2015-11-27 NOTE — Progress Notes (Signed)
[facility] Clinic Note - Initial Visit   Date: 11/27/2015  Heather [name] MRN: [id number] DOB: Mar 25, 1951   Dear Dr. Quay Burow:  Thank you for your kind referral of Heather [name] for consultation of paresthesias. Although her history is well known to you, please allow [place] to reiterate it for the purpose of our medical record. The patient was accompanied to the clinic by self.    History of Present Illness: Heather [name] is a 65 y.o. right-handed Caucasian female with hypertension, GERD, diabetes mellitus, interstitial cystitis presenting for evaluation of generalized paresthesias and back pain.    Starting around 2014, she began having numbness involving the toes, which transitioned into tingling in 2016.  Symptoms are restricted to her toes and does to involve the dorsum of the feet or lower leg.  She does not have weakness of the legs, but has noticed problems with imbalance especially when working in the yard where she has also had a few falls.  She tried Lyrica but this was stopped due to cognitive side effects.  She is currently taking gabapentin 411m which provides 50% relief.    Starting in early 2017, she began having bilateral thumb pain, described as throbbing.  She has weakness with opening jars and cans.  There is no numbness or tingling of the hands.   She also complains of right sided low back and hip pain which is aggravated by prolonged activity > 30 minutes.  Pain is localized and does not radiate down her legs.    She reports that her father had similar symptoms of numbness in the feet and his spine disintegrated. Unfortunately, she is not aware of his medical history very well, but became concerned about her symptoms because they resembled what her father experienced. He had numbness of the hands and feet, no history of diabetes.  He was ambulating with a walker until 1.5 year prior his passing and remained bedbound for the last year of his  life.  She is not aware of the diagnosis.   Out-side paper records, electronic medical record, and images have been reviewed where available and summarized as:  Lab Results  Component Value Date   TSH 1.59 10/09/2015   Lab Results  Component Value Date   HGBA1C 7.0* 09/10/2015   Lab Results  Component Value Date   VITAMINB12 618 09/10/2015    Past Medical History  Diagnosis Date  . Hypertension   . Interstitial cystitis     Dr NJanice Norrie . Heart murmur   . Shortness of breath dyspnea     "due to low hemaglobin"  . Colon polyps   . GERD (gastroesophageal reflux disease)   . History of hiatal hernia   . Anemia   . History of transfusion   . History of DVT (deep vein thrombosis) 1970'S  . Diabetes mellitus without complication ([place]   . Environmental allergies     Past Surgical History  Procedure Laterality Date  . Cystoscopy      X 4; Dr NJanice Norrie . Appendectomy    . Total abdominal hysterectomy      metromenorrhagia  . Bilateral salpingoophorectomy      painful cysts  . Colonoscopy  2005    negative; Dr MEarlean Shawl F/U declined  . Laparoscopic right colectomy Right 08/19/2015    Procedure: LAPAROSCOPIC ASSISTED  RIGHT COLECTOMY;  Surgeon: DAlphonsa Overall MD;  Location: [facility];  Service: General;  Laterality: Right;  . Premaglinant polyps  Medications:  Outpatient Encounter Prescriptions as of 11/27/2015  Medication Sig Note  . Alum & Mag Hydroxide-Simeth (ANTACID ADVANCED PO) Take 1 tablet by mouth daily as needed (heartburn).    . Blood Glucose Monitoring Suppl ([employer] BLOOD GLUCOSE METER) W/DEVICE KIT Use to test blood sugar twice daily ICD 10 E11 9   . [facility] LANCETS THIN MISC Use to test blood sugar twice daily ICD 19 E11 9   . gabapentin (NEURONTIN) 300 MG capsule Take 2 capsules (600 mg total) by mouth at bedtime.   Marland Kitchen glucose blood ([employer]S ADVANCED GLUCOSE TEST) test strip Test blood sugar twice daily. DX E11.9   . hydrochlorothiazide (MICROZIDE) 12.5 MG capsule Take 1  capsule (12.5 mg total) by mouth daily. (Patient taking differently: Take 12.5 mg by mouth at bedtime. )   . IRON PO Take 65 mg by mouth every other day.   . losartan (COZAAR) 100 MG tablet Take 1 tablet (100 mg total) by mouth daily.   Marland Kitchen LYRICA 50 MG capsule  11/27/2015: Received from: External Pharmacy  . metFORMIN (GLUCOPHAGE-XR) 500 MG 24 hr tablet Take 1 tablet (500 mg total) by mouth daily.   . Meth-Hyo-M Bl-Na Phos-Ph Sal (URIBEL) 118 MG CAPS Take 1 capsule by mouth 2 (two) times daily as needed.   . Multiple Vitamin (MULTIVITAMIN WITH MINERALS) TABS tablet Take by mouth every morning.   . pentosan polysulfate (ELMIRON) 100 MG capsule Take 100 mg by mouth 3 (three) times daily.   . ranitidine (ZANTAC) 150 MG tablet Take 150 mg by mouth daily.    . Simethicone (GAS-X PO) Take 1 tablet by mouth daily as needed (gas.).    Marland Kitchen sitaGLIPtin (JANUVIA) 100 MG tablet Take 1 tablet (100 mg total) by mouth daily.   Marland Kitchen tiZANidine (ZANAFLEX) 2 MG tablet Take 1 tablet (2 mg total) by mouth at bedtime as needed for muscle spasms.   . [DISCONTINUED] gabapentin (NEURONTIN) 100 MG capsule Take 4 capsules (400 mg total) by mouth at bedtime.    No facility-administered encounter medications on file as of 11/27/2015.     Allergies:  Allergies  Allergen Reactions  . Flagyl [Metronidazole] Other (See Comments)    Made really sick. Was able to tolerate dose 4/17 with zofran  . Lisinopril     04/03/15 cough reported X several months  . Sulfonamide Derivatives     nausea    Family History: Family History  Problem Relation Age of Onset  . Heart attack Mother     in 12s  . Hypertension Mother   . Stroke Mother      in 61s  . Stroke Maternal Grandfather 50  . Diabetes Neg Hx   . Heart attack Paternal Grandfather     in 3s  . Breast cancer Sister   . Other Father     Deceased, 43  . Healthy Brother   . Healthy Son   . Healthy Daughter     Social History: Social History  Substance Use Topics    . Smoking status: Former Smoker    Quit date: 05/18/1984  . Smokeless tobacco: Never Used     Comment: smoked 1972-1986, up to 1 ppd  . Alcohol Use: No   Social History   Social History Narrative   Lives with brother, daughter and 2 grandchildren in a 3 story home.  Has 2 children.  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  Highest level of education:  2 years of graduate school    Review of Systems:  CONSTITUTIONAL: No fevers, chills, night sweats, or weight loss.   EYES: No visual changes or eye pain ENT: No hearing changes.  No history of nose bleeds.   RESPIRATORY: No cough, wheezing and shortness of breath.   CARDIOVASCULAR: Negative for chest pain, and palpitations.   GI: Negative for abdominal discomfort, blood in stools or black stools.  No recent change in bowel habits.   GU:  No history of incontinence.   MUSCLOSKELETAL: + history of joint pain or swelling.  No myalgias.   SKIN: Negative for lesions, rash, and itching.   HEMATOLOGY/ONCOLOGY: Negative for prolonged bleeding, bruising easily, and swollen nodes.    ENDOCRINE: Negative for cold or heat intolerance, polydipsia or goiter.   PSYCH:  No depression or anxiety symptoms.   NEURO: As Above.   Vital Signs:  BP 130/90 mmHg  Pulse 72  Ht '5\' 2"'  (1.575 m)  Wt 189 lb 3 oz (85.815 kg)  BMI 34.59 kg/m2  SpO2 98%   General Medical Exam:   General:  Well appearing, comfortable.   Eyes/ENT: see cranial nerve examination.   Neck: No masses appreciated.  Full range of motion without tenderness.  No carotid bruits. Respiratory:  Clear to auscultation, good air entry bilaterally.   Cardiac:  Regular rate and rhythm, no murmur.   Extremities:  Mild hammer toes, no edema, or skin discoloration.  Skin:  No rashes or lesions.  Neurological Exam: MENTAL STATUS including orientation to time, place, person, recent and remote memory, attention span and concentration,  language, and fund of knowledge is normal.  Speech is not dysarthric.  CRANIAL NERVES: II:  No visual field defects.  Unremarkable fundi.   III-IV-VI: Pupils equal round and reactive to light.  Normal conjugate, extra-ocular eye movements in all directions of gaze.  No nystagmus.  No ptosis.   V:  Normal facial sensation.   VII:  Normal facial symmetry and movements.   VIII:  Normal hearing and vestibular function.   IX-X:  Normal palatal movement.   XI:  Normal shoulder shrug and head rotation.   XII:  Normal tongue strength and range of motion, no deviation or fasciculation.  MOTOR:  No atrophy, fasciculations or abnormal movements.  No pronator drift.  Tone is normal.    Right Upper Extremity:    Left Upper Extremity:    Deltoid  5/5   Deltoid  5/5   Biceps  5/5   Biceps  5/5   Triceps  5/5   Triceps  5/5   Wrist extensors  5/5   Wrist extensors  5/5   Wrist flexors  5/5   Wrist flexors  5/5   Finger extensors  5/5   Finger extensors  5/5   Finger flexors  5/5   Finger flexors  5/5   Dorsal interossei  5/5   Dorsal interossei  5/5   Abductor pollicis  5/5   Abductor pollicis  5/5   Tone (Ashworth scale)  0  Tone (Ashworth scale)  0   Right Lower Extremity:    Left Lower Extremity:    Hip flexors  5/5   Hip flexors  5/5   Hip extensors  5/5   Hip extensors  5/5   Knee flexors  5/5   Knee flexors  5/5   Knee extensors  5/5   Knee extensors  5/5   Dorsiflexors  5/5   Dorsiflexors  5/5   Plantarflexors  5/5   Plantarflexors  5/5   Toe extensors  5/5   Toe extensors  5/5   Toe flexors  4/5   Toe flexors  4/5   Tone (Ashworth scale)  0  Tone (Ashworth scale)  0   MSRs:  Right                                                                 Left brachioradialis 2+  brachioradialis 2+  biceps 2+  biceps 2+  triceps 2+  triceps 2+  patellar 2+  patellar 2+  ankle jerk 0  ankle jerk 0  Hoffman no  Hoffman no  plantar response down  plantar response down   SENSORY:  Pin prick,  vibration, temperature, and light touch is diminished distal to ankles bilaterally and follows a gradient pattern.  Romberg's sign shows moderate sway.   COORDINATION/GAIT: Normal finger-to- nose-finger and heel-to-shin.  Intact rapid alternating movements bilaterally.  Able to rise from a chair without using arms.  Gait narrow based and stable.  She is very unsteady with tandem gait.  Stressed gait intact.    IMPRESSION: 1.  Distal and symmetric diabetic polyneuropathy affecting the feet  - Clinically, she has painful paresthesias of the feet and imbalance.  - Further optimize her gabapentin to 631m at bedtime  - Physical therapy for balance training declined   - Fall precautions discussed, recommend cane when she is on uneven surfaces  - Reassured her that I do not see anything worrisome on her exam that would suggest her father's clinical presentation was heriditary  2.  Bilateral thumb pain is more suggestive of arthritis or de Quervian's tenosynovitis  - There is no associated weakness or paresthesias to suggest CTS or neuropathy  - For completeness, I offered to perform NCS/EMG to be sure there is no overlapping neurological etiology of her pain, but she declined testing   3.  Right sided musculoskeletal low back pain  - Start tizanidine 221mat bedtime  Return to clinic as needed   The duration of this appointment visit was 45 minutes of face-to-face time with the patient.  Greater than 50% of this time was spent in counseling, explanation of diagnosis, planning of further management, and coordination of care.   Thank you for allowing me to participate in patient's care.  If I can answer any additional questions, I would be pleased to do so.    Sincerely,    Donika K. PaPosey ProntoDO

## 2015-11-27 NOTE — Patient Instructions (Addendum)
1.  Start tizanidine 2mg  at bedtime for low back pain 2.  Increase gabapentin to 600mg  at bedtime 3.  If your symptoms worsen, please call to schedule NCS/EMG of the right side and/or physical therapy  Return to clinic as needed  [facility]  Preventing Falls in the [place] are common, often dreaded events in the lives of older people. Aside from the obvious injuries and even death that may result, falls can cause wide-ranging consequences including loss of independence, mental decline, decreased activity, and mobility. Younger people are also at risk of falling, especially those with chronic illnesses and fatigue.  Ways to reduce the risk for falling:  * Examine diet and medications. Warm foods and alcohol dilate blood vessels, which can lead to dizziness when standing. Sleep aids, antidepressants, and pain medications can also increase the likelihood of a fall.  * Get a vison exam. Poor vision, cataracts, and glaucoma increase the chances of falling.  * Check foot gear. Shoes should fit snugly and have a sturdy, nonskid sole and broad, low heel.  * Participate in a physician-approved exercise program to build and maintain muscle strength and improve balance and coordination.  * Increase vitamin D intake. Vitamin D improves muscle strength and increases the amount of calcium the body is able to absorb and deposit in bones.  How to prevent falls from common hazards:  * Floors - Remove all loose wires, cords, and throw rugs. Minimize clutter. Make sure rugs are anchored and smooth. Keep furniture in its usual place.  * Chairs - Use chairs with straight backs, armrests, and firm seats. Add firm cushions to existing pieces to add height.  * Bathroom - Install grab bars and non-skid tape in the tub or shower. Use a bathtub transfer bench or a shower chair with a back support. Use an elevated toilet seat and/or safety rails to assist standing from a low surface. Do not use towel racks or  bathroom tissue holders to help you stand.  * Lighting - Make sure halls, stairways, and entrances are well-lit. Install a night light in your bathroom or hallway. Make sure there is a light switch at the top and bottom of the staircase. Turn lights on if you get up in the middle of the night. Make sure lamps or light switches are within reach of the bed if you have to get up during the night.  * Kitchen - Install non-skid rubber mats near the sink and stove. Clean spills immediately. Store frequently used utensils, pots, and pans between waist and eye level. This helps prevent reaching and bending. Sit when getting things out of the lower cupboards.  * Living room / Spencer furniture with wide spaces in between, giving enough room to move around. Establish a route through the living room that gives you something to hold onto as you walk.  * Stairs - Make sure treads, rails, and rugs are secure. Install a rail on both sides of the stairs. If stairs are a threat, it might be helpful to arrange most of your activities on the lower level to reduce the number of times you must climb the stairs.  * Entrances and doorways - Install metal handles on the walls adjacent to the doorknobs of all doors to make it more secure as you travel through the doorway.  Tips for maintaining balance:  * Keep at least one hand free at all times Try using a backpack or fanny pack to hold  things rather than carrying them in your hands. Never carry objects in both hands when walking as this interferes with keeping your balance.  * Attempt to swing both arms from front to back while walking. This might require a conscious effort if Parkinson's disease has diminished your movement. It will, however, help you to maintain balance and posture, and reduce fatigue.  * Consciously lift your feet off the ground when walking. Shuffling and dragging of the feet is a common culprit in losing your balance.  * When trying to navigate  turns, use a "U" technique of facing forward and making a wide turn, rather than pivoting sharply.  * Try to stand with your feet shoulder-length apart. When your feet are close together for any length of time, you increase your risk of losing your balance and falling.  * Do one thing at a time. Do not try to walk and accomplish another task, such as reading or looking around. The decrease in your automatic reflexes complicates motor function, so the less distraction, the better.  * Do not wear rubber or gripping soled shoes, they might "catch" on the floor and cause tripping.  * Move slowly when changing positions. Use deliberate, concentrated movements and, if needed, use a grab bar or walking aid. Count fifteen (15) seconds after standing to begin walking.  * If balance is a continuous problem, you might want to consider a walking aid such as a cane, walking stick, or walker. Once you have mastered walking with help, you may be ready to try it again on your own.  This information is provided by [facility] and is not intended to replace the medical advice of your physician or other health care providers. Please consult your physician or other health care providers for advice regarding your specific medical condition.

## 2015-11-28 ENCOUNTER — Encounter: Payer: Self-pay | Admitting: Dietician

## 2015-11-28 ENCOUNTER — Encounter: Payer: Medicare Other | Attending: Endocrinology | Admitting: Dietician

## 2015-11-28 VITALS — Ht 62.0 in | Wt 189.0 lb

## 2015-11-28 DIAGNOSIS — E1142 Type 2 diabetes mellitus with diabetic polyneuropathy: Secondary | ICD-10-CM | POA: Insufficient documentation

## 2015-11-28 DIAGNOSIS — E118 Type 2 diabetes mellitus with unspecified complications: Secondary | ICD-10-CM

## 2015-11-28 DIAGNOSIS — Z713 Dietary counseling and surveillance: Secondary | ICD-10-CM | POA: Insufficient documentation

## 2015-11-28 NOTE — Patient Instructions (Signed)
Choose wisely, Eat slowly, Stop when you are satisfied Continue your active lifestyle Keep a consistent carbohydrate intake and avoid skipping meals Reduce your intake of saturated fat (bacon, butter, red meat) Increase your plant intake (non starchy vegetables)  Aim for 3 Carb Choices per meal (45 grams) +/- 1 either way  Aim for 0-1 Carbs per snack if hungry  Include protein in moderation with your meals and snacks Consider reading food labels for Total Carbohydrate and Fat Grams of foods Consider checking BG at alternate times per day as directed by MD  Continue taking medication as directed by MD

## 2015-11-28 NOTE — Progress Notes (Signed)
Medical Nutrition Therapy:  Appt start time: [id number] end time:  1700.   Assessment:  Primary concerns today: Patient is here alone.  She would like to learn how to better improve her blood sugar.  She has recently started on Januvia about 1 month ago but she does not know that this is helping based on her blood sugar readings.  Post surgery 09/05/15 due to premalignant colon polyps and 1/3 of her colon was removed.  She has had some diarrhea since but this is improving.  Hx includes type 2 diabetes, HTN, hyperlipidemia, neuropathy, GERD and interstitial cystitis.  Labs include:  HgbA1C of 7% (09/10/15), normal vitamin B-12 of 618, cholesterol 11/30/14 of 290, trigllycerides 568, HDL 56, LDL 171.  Patient's brother lives with her and there is an attached addition that her daughter and family live in.  She enjoys baking, cooking, and Medical laboratory scientific officer.  She dog sits and watches her grand children.  She does not eat out and keeps very busy.  Brother and her will share cooking.  She states that she has decreased her meat intake, increased her vegetable and nut intake.  She has had increased stress of death of father 08/31/07, death of husband in August 31, 2010 of liver problems, and death of mother in 1.  She was caregiver to all.  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  She is on medicare and has difficulty affording everything she needs but her family helps.  Preferred Learning Style:   No preference indicated   Learning Readiness:   Ready  Change in progress   MEDICATIONS: see list to include:  MVI, ocuvite, sublingual vitamin b-12, Metformin XR, and Januvia   DIETARY INTAKE:  Usual eating pattern includes 2-3 meals and 1-2 snacks per day.  Everyday foods include craves sweets (cake/cookies).  Will not buy sweets but some bring to home.  Avoided foods include ice cream, milk (lactose intolerant since surgery in 08/31/22 and uses Lactaid milk.  Uses little salt. No juice.  24-hr recall:  B ( AM): Artesian toast with  butter, greek yogurt, black coffee. Snk ( AM): none  L ( PM): sandwich (Artisian or thin melba rye, cheese, tomato, lettuce, lite mayo) or Salad or Skips OR greek yogurt OR [place] granola bar Snk ( PM): granola bar or sandwich if missed lunch D ( PM): BLT OR eggs and bacon, fruit, occasional potato chips; big hot meal such as meatloaf, baked potato and vegetables on the weekend or scalloped potatoes and hot dogs or homemade mac and cheese or spaghetti and meatballs or roast but most often in the fall and winter. Snk ( PM): "watching TV" toast with butter or fig newton or frozen yogurt or fudge bar (sugar free) Beverages: black coffee, water  Usual physical activity: gardening, lawn work, cleans her own home, walks dogs and cares for other animals.    Estimated energy needs: 1400 calories 158 g carbohydrates 88 g protein 47 g fat  Progress Towards Goal(s):  In progress.   Nutritional Diagnosis:  NB-1.1 Food and nutrition-related knowledge deficit As related to balance of carbohydrate, protein, and fat.  As evidenced by patient report and diet hx.    Intervention:  Nutrition counseling/education related to diabetes control and weight management/mindful eating approach.  Discussed food portions using models.  Discussed increasing the nutrient profile of her diet.  Discussed suggestions to reduce cholesterol/triglycerides.  Choose wisely, Eat slowly, Stop when you are satisfied.  Away from TV and other electronics. Continue your active  lifestyle Keep a consistent carbohydrate intake and avoid skipping meals Reduce your intake of saturated fat (bacon, butter, red meat) Increase your plant intake (non starchy vegetables)  Aim for 3 Carb Choices per meal (45 grams) +/- 1 either way  Aim for 0-1 Carbs per snack if hungry  Include protein in moderation with your meals and snacks Consider reading food labels for Total Carbohydrate and Fat Grams of foods Consider checking BG at alternate  times per day as directed by MD  Continue taking medication as directed by MD  Teaching Method Utilized:  Visual Auditory Hands on  Handouts given during visit include:  My plate  Meal plan card  Snack list  Barriers to learning/adherence to lifestyle change: income  Demonstrated degree of understanding via:  Teach Back   Monitoring/Evaluation:  Dietary intake, exercise, and body weight prn.

## 2015-11-29 DIAGNOSIS — R350 Frequency of micturition: Secondary | ICD-10-CM | POA: Diagnosis not present

## 2015-11-29 DIAGNOSIS — N3010 Interstitial cystitis (chronic) without hematuria: Secondary | ICD-10-CM | POA: Diagnosis not present

## 2016-01-24 DIAGNOSIS — Z23 Encounter for immunization: Secondary | ICD-10-CM | POA: Diagnosis not present

## 2016-01-30 ENCOUNTER — Ambulatory Visit (INDEPENDENT_AMBULATORY_CARE_PROVIDER_SITE_OTHER): Payer: Medicare Other | Admitting: Endocrinology

## 2016-01-30 ENCOUNTER — Encounter: Payer: Self-pay | Admitting: Endocrinology

## 2016-01-30 VITALS — BP 132/80 | HR 80 | Ht 62.0 in | Wt 190.0 lb

## 2016-01-30 DIAGNOSIS — E1142 Type 2 diabetes mellitus with diabetic polyneuropathy: Secondary | ICD-10-CM | POA: Diagnosis not present

## 2016-01-30 LAB — POCT GLYCOSYLATED HEMOGLOBIN (HGB A1C): Hemoglobin A1C: 6.9

## 2016-01-30 MED ORDER — DAPAGLIFLOZIN PROPANEDIOL 5 MG PO TABS: 5.0000 mg | ORAL_TABLET | Freq: Every day | ORAL | 11 refills | Status: DC

## 2016-01-30 NOTE — Patient Instructions (Addendum)
check your blood sugar once a day.  vary the time of day when you check, between before the 3 meals, and at bedtime.  also check if you have symptoms of your blood sugar being too high or too low.  please keep a record of the readings and bring it to your next appointment here (or you can bring the meter itself).  You can write it on any piece of paper.  please call us sooner if your blood sugar goes below 70, or if you have a lot of readings over 200.  I have sent a prescription to your pharmacy, to add "farxiga." As this works as a fluid pill, this would replace the HCTZ Please come back for a follow-up appointment in 4 months.

## 2016-01-30 NOTE — Progress Notes (Signed)
Subjective:    Patient ID: Heather [name], female    DOB: 1951-05-17, 65 y.o.   MRN: [id number]  HPI Pt returns for f/u of diabetes mellitus: DM type: 2 Dx'ed: 4268 Complications: polyneuropathy Therapy: 2 oral meds GDM: never DKA: never Severe hypoglycemia: never Pancreatitis: never Other: she has never been on insulin; short bowel syndrome limits metformin dosage.   Interval history: she brings a record of her cbg's which i have reviewed today.  All are in the low to mid-100's. pt states she feels well in general. Past Medical History:  Diagnosis Date  . Anemia   . Colon polyps   . Diabetes mellitus without complication ([place])   . Environmental allergies   . GERD (gastroesophageal reflux disease)   . Heart murmur   . History of DVT (deep vein thrombosis) 1970'S  . History of hiatal hernia   . History of transfusion   . Hyperlipidemia   . Hypertension   . Interstitial cystitis    Dr Janice Norrie  . Shortness of breath dyspnea    "due to low hemaglobin"    Past Surgical History:  Procedure Laterality Date  . APPENDECTOMY    . BILATERAL SALPINGOOPHORECTOMY     painful cysts  . COLONOSCOPY  2005   negative; Dr Earlean Shawl. F/U declined  . CYSTOSCOPY     X 4; Dr Janice Norrie  . LAPAROSCOPIC RIGHT COLECTOMY Right 08/19/2015   Procedure: LAPAROSCOPIC ASSISTED  RIGHT COLECTOMY;  Surgeon: Alphonsa Overall, MD;  Location: [facility];  Service: General;  Laterality: Right;  . PREMAGLINANT POLYPS    . TOTAL ABDOMINAL HYSTERECTOMY     metromenorrhagia    Social History   Social History  . Marital status: Widowed    Spouse name: N/A  . Number of children: N/A  . Years of education: N/A   Occupational History  . Not on file.   Social History Main Topics  . Smoking status: Former Smoker    Quit date: 05/18/1984  . Smokeless tobacco: Never Used     Comment: smoked 1972-1986, up to 1 ppd  . Alcohol use No  . Drug use: No  . Sexual activity: Not on file   Other Topics Concern  . Not on file     Social History Narrative   Lives with brother, daughter and 2 grandchildren in a 3 story home.  Has 2 children.  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     Highest level of education:  2 years of graduate school    Current Outpatient Prescriptions on File Prior to Visit  Medication Sig Dispense Refill  . Alum & Mag Hydroxide-Simeth (ANTACID ADVANCED PO) Take 1 tablet by mouth daily as needed (heartburn).     . Blood Glucose Monitoring Suppl ([employer]S BLOOD GLUCOSE METER) W/DEVICE KIT Use to test blood sugar twice daily ICD 10 E11 9 1 kit 0  . [facility]S LANCETS THIN MISC Use to test blood sugar twice daily ICD 19 E11 9 100 each 3  . gabapentin (NEURONTIN) 300 MG capsule Take 2 capsules (600 mg total) by mouth at bedtime. 180 capsule 3  . glucose blood ([employer]S ADVANCED GLUCOSE TEST) test strip Test blood sugar twice daily. DX E11.9 100 each 1  . losartan (COZAAR) 100 MG tablet Take 1 tablet (100 mg total) by mouth daily. 90 tablet 3  . metFORMIN (GLUCOPHAGE-XR) 500 MG 24 hr tablet Take 1 tablet (500 mg total) by mouth  daily. 90 tablet 3  . Meth-Hyo-M Bl-Na Phos-Ph Sal (URIBEL) 118 MG CAPS Take 1 capsule by mouth 2 (two) times daily as needed.  3  . Multiple Vitamin (MULTIVITAMIN WITH MINERALS) TABS tablet Take by mouth every morning.    . multivitamin-lutein (OCUVITE-LUTEIN) CAPS capsule Take 1 capsule by mouth daily.    . ranitidine (ZANTAC) 150 MG tablet Take 150 mg by mouth daily.     . Simethicone (GAS-X PO) Take 1 tablet by mouth daily as needed (gas.).     Marland Kitchen sitaGLIPtin (JANUVIA) 100 MG tablet Take 1 tablet (100 mg total) by mouth daily. 90 tablet 3  . tiZANidine (ZANAFLEX) 2 MG tablet Take 1 tablet (2 mg total) by mouth at bedtime as needed for muscle spasms. 20 tablet 3  . vitamin B-12 (CYANOCOBALAMIN) 250 MCG tablet Take 5,000 mcg by mouth daily.      No current facility-administered medications on file prior to visit.     Allergies  Allergen  Reactions  . Flagyl [Metronidazole] Other (See Comments)    Made really sick. Was able to tolerate dose 4/17 with zofran  . Lisinopril     04/03/15 cough reported X several months  . Sulfonamide Derivatives     nausea    Family History  Problem Relation Age of Onset  . Heart attack Mother     in 85s  . Hypertension Mother   . Stroke Mother      in 5s  . Stroke Maternal Grandfather 50  . Diabetes Neg Hx   . Heart attack Paternal Grandfather     in 61s  . Breast cancer Sister   . Other Father     Deceased, 65  . Healthy Brother   . Healthy Son   . Healthy Daughter     BP 132/80   Pulse 80   Ht '5\' 2"'  (1.575 m)   Wt 190 lb (86.2 kg)   SpO2 97%   BMI 34.75 kg/m   Review of Systems No weight change    Objective:   Physical Exam VITAL SIGNS:  See vs page GENERAL: no distress Pulses: dorsalis pedis intact bilat.   MSK: no deformity of the feet CV: trace bilat leg edema Skin:  no ulcer on the feet.  normal color and temp on the feet. Neuro: sensation is intact to touch on the feet.  A1c=6.9% Lab Results  Component Value Date   CREATININE 0.81 09/10/2015   BUN 15 09/10/2015   NA 135 09/10/2015   K 4.1 09/10/2015   CL 96 09/10/2015   CO2 30 09/10/2015      Assessment & Plan:  Type 2 DM: Needs increased rx, if it can be done with a regimen that avoids or minimizes hypoglycemia. Edema, new Short bowel syndrome.  This limits metformin dosage HTN: she'll need to d/c HCTZ if she starts farxiga, to prevent hypovolemia

## 2016-02-04 ENCOUNTER — Encounter: Payer: Self-pay | Admitting: Internal Medicine

## 2016-02-04 ENCOUNTER — Ambulatory Visit (INDEPENDENT_AMBULATORY_CARE_PROVIDER_SITE_OTHER): Payer: Medicare Other | Admitting: Internal Medicine

## 2016-02-04 ENCOUNTER — Ambulatory Visit (INDEPENDENT_AMBULATORY_CARE_PROVIDER_SITE_OTHER)
Admission: RE | Admit: 2016-02-04 | Discharge: 2016-02-04 | Disposition: A | Discharge status: Home / Self Care | Payer: Medicare Other | Source: Ambulatory Visit | Attending: Internal Medicine | Admitting: Internal Medicine

## 2016-02-04 ENCOUNTER — Other Ambulatory Visit (INDEPENDENT_AMBULATORY_CARE_PROVIDER_SITE_OTHER): Payer: Medicare Other

## 2016-02-04 VITALS — BP 134/90 | HR 66 | Temp 98.3°F | Resp 16 | Wt 191.0 lb

## 2016-02-04 DIAGNOSIS — Z0000 Encounter for general adult medical examination without abnormal findings: Secondary | ICD-10-CM

## 2016-02-04 DIAGNOSIS — Z780 Asymptomatic menopausal state: Secondary | ICD-10-CM

## 2016-02-04 DIAGNOSIS — M159 Polyosteoarthritis, unspecified: Secondary | ICD-10-CM | POA: Insufficient documentation

## 2016-02-04 DIAGNOSIS — E782 Mixed hyperlipidemia: Secondary | ICD-10-CM

## 2016-02-04 DIAGNOSIS — E1142 Type 2 diabetes mellitus with diabetic polyneuropathy: Secondary | ICD-10-CM | POA: Diagnosis not present

## 2016-02-04 DIAGNOSIS — Z13820 Encounter for screening for osteoporosis: Secondary | ICD-10-CM | POA: Diagnosis not present

## 2016-02-04 DIAGNOSIS — S51851A Open bite of right forearm, initial encounter: Secondary | ICD-10-CM | POA: Diagnosis not present

## 2016-02-04 DIAGNOSIS — W5501XA Bitten by cat, initial encounter: Secondary | ICD-10-CM | POA: Insufficient documentation

## 2016-02-04 DIAGNOSIS — Z1159 Encounter for screening for other viral diseases: Secondary | ICD-10-CM

## 2016-02-04 DIAGNOSIS — S51859A Open bite of unspecified forearm, initial encounter: Secondary | ICD-10-CM | POA: Insufficient documentation

## 2016-02-04 DIAGNOSIS — I10 Essential (primary) hypertension: Secondary | ICD-10-CM

## 2016-02-04 DIAGNOSIS — Z23 Encounter for immunization: Secondary | ICD-10-CM | POA: Diagnosis not present

## 2016-02-04 DIAGNOSIS — N3010 Interstitial cystitis (chronic) without hematuria: Secondary | ICD-10-CM

## 2016-02-04 LAB — CBC WITH DIFFERENTIAL/PLATELET
Basophils Absolute: 0 K/uL (ref 0.0–0.1)
Basophils Relative: 0.4 % (ref 0.0–3.0)
Eosinophils Absolute: 0.2 K/uL (ref 0.0–0.7)
Eosinophils Relative: 2.8 % (ref 0.0–5.0)
HCT: 37 % (ref 36.0–46.0)
Hemoglobin: 12.5 g/dL (ref 12.0–15.0)
Lymphocytes Relative: 30.3 % (ref 12.0–46.0)
Lymphs Abs: 2 K/uL (ref 0.7–4.0)
MCHC: 33.7 g/dL (ref 30.0–36.0)
MCV: 77.2 fl — ABNORMAL LOW (ref 78.0–100.0)
Monocytes Absolute: 0.5 K/uL (ref 0.1–1.0)
Monocytes Relative: 7.7 % (ref 3.0–12.0)
Neutro Abs: 3.8 K/uL (ref 1.4–7.7)
Neutrophils Relative %: 58.8 % (ref 43.0–77.0)
Platelets: 363 K/uL (ref 150.0–400.0)
RBC: 4.79 Mil/uL (ref 3.87–5.11)
RDW: 14.8 % (ref 11.5–15.5)
WBC: 6.4 K/uL (ref 4.0–10.5)

## 2016-02-04 LAB — MICROALBUMIN / CREATININE URINE RATIO
Creatinine,U: 61.1 mg/dL
MICROALB/CREAT RATIO: 5.7 mg/g (ref 0.0–30.0)
Microalb, Ur: 3.5 mg/dL — ABNORMAL HIGH (ref 0.0–1.9)

## 2016-02-04 LAB — COMPREHENSIVE METABOLIC PANEL
ALBUMIN: 4.2 g/dL (ref 3.5–5.2)
ALT: 25 U/L (ref 0–35)
AST: 17 U/L (ref 0–37)
Alkaline Phosphatase: 61 U/L (ref 39–117)
BUN: 15 mg/dL (ref 6–23)
CALCIUM: 9.8 mg/dL (ref 8.4–10.5)
CHLORIDE: 103 meq/L (ref 96–112)
CO2: 28 mEq/L (ref 19–32)
Creatinine, Ser: 0.92 mg/dL (ref 0.40–1.20)
GFR: 64.65 mL/min (ref >60.00)
Glucose, Bld: 128 mg/dL — ABNORMAL HIGH (ref 70–99)
POTASSIUM: 4.7 meq/L (ref 3.5–5.1)
Sodium: 140 mEq/L (ref 135–145)
Total Bilirubin: 0.7 mg/dL (ref 0.2–1.2)
Total Protein: 7.4 g/dL (ref 6.0–8.3)

## 2016-02-04 LAB — LIPID PANEL
Cholesterol: 227 mg/dL — ABNORMAL HIGH (ref 0–200)
HDL: 54.2 mg/dL
LDL Cholesterol: 135 mg/dL — ABNORMAL HIGH (ref 0–99)
NonHDL: 172.46
Total CHOL/HDL Ratio: 4
Triglycerides: 188 mg/dL — ABNORMAL HIGH (ref 0.0–149.0)
VLDL: 37.6 mg/dL (ref 0.0–40.0)

## 2016-02-04 LAB — TSH: TSH: 1.55 u[IU]/mL (ref 0.35–4.50)

## 2016-02-04 MED ORDER — TIZANIDINE HCL 2 MG PO TABS: 2.0000 mg | ORAL_TABLET | Freq: Every evening | ORAL | 3 refills | Status: DC | PRN

## 2016-02-04 NOTE — Assessment & Plan Note (Signed)
Getting relief with tizanidine at night Will continue - refilled today

## 2016-02-04 NOTE — Patient Instructions (Addendum)
  Ms. Cicconi , Thank you for taking time to come for your Medicare Wellness Visit. I appreciate your ongoing commitment to your health goals. Please review the following plan we discussed and let me know if I can assist you in the future.   These are the goals we discussed: Goals    Work on weight loss      This is a list of the screening recommended for you and due dates:  Health Maintenance  Topic Date Due  .  Hepatitis C: One time screening is recommended by Center for Disease Control  (CDC) for  adults born from [birth year] through 1967.   Ordered today  . DEXA scan (bone density measurement)  Ordered today  . Eye exam for diabetics  06/29/2015  . Tetanus Vaccine  Given today  . Hemoglobin A1C  07/29/2016  . Complete foot exam   01/29/2017  . Mammogram  10/06/2017  . Colon Cancer Screening  06/12/2025  . Flu Shot  Completed  . Shingles Vaccine  Completed  . Pneumonia vaccines  Completed     Test(s) ordered today. Your results will be released to [place] (or called to you) after review, usually within 72hours after test completion. If any changes need to be made, you will be notified at that same time.  All other Health Maintenance issues reviewed.   All recommended immunizations and age-appropriate screenings are up-to-date or discussed.  Flu vaccine and tdap vaccine administered today.   Medications reviewed and updated.  No changes recommended at this time.  Your prescription(s) have been submitted to your pharmacy. Please take as directed and contact our office if you believe you are having problem(s) with the medication(s).   Please followup in 6 months

## 2016-02-04 NOTE — Assessment & Plan Note (Signed)
BP well controlled Current regimen effective and well tolerated Continue current medications at current doses   BP Readings from Last 3 Encounters:  02/04/16 134/90  01/30/16 132/80  11/27/15 130/90

## 2016-02-04 NOTE — Assessment & Plan Note (Addendum)
Takes uribel as needed Will see urology as needed

## 2016-02-04 NOTE — Assessment & Plan Note (Signed)
Taking red yeast rice Would like to avoid a statin Check lipids, cmp today

## 2016-02-04 NOTE — Progress Notes (Signed)
Subjective:    Patient ID: Heather [name], female    DOB: 07-22-1950, 65 y.o.   MRN: [id number]  HPI Here for medicare wellness exam and a routine follow up visit.   She had a recent cat bite or scratch.  It was her own cat and it occurred at the Vet's office .  She denies any symptoms of an infection, but was concerned about her tetanus being up to date.  Diabetes: She is following with Dr Loanne Drilling.  She is taking her medication daily as prescribed. She is compliant with a diabetic diet. She is exercising regularly. She monitors her sugars and they have been running 127, 145. She checks her feet daily and denies foot lesions.   Hypertension: She is taking her medication daily, but has not taken them today. She is compliant with a low sodium diet.  She is exercising regularly.  She does monitor her blood pressure at home - 128/80 on average.    Hyperlipidemia: She is red yeast rice.  She would prefer not to avoid statins.  . She is compliant with a low fat/cholesterol diet. She is exercising regularly.      I have personally reviewed and have noted 1.The patient's medical and social history 2.Their use of alcohol, tobacco or illicit drugs 3.Their current medications and supplements 4.The patient's functional ability including ADL's, fall risks, home safety             risks and hearing or visual impairment. 5.Diet and physical activities 6.Evidence for depression or mood disorders 7.Care team reviewed - Dr Loanne Drilling - endocrine, Dr Earlean Shawl - GI, Dr Peter Garter - Eye   Are there smokers in your home (other than you)? Yes, brother - smokes outside  Risk Factors Exercise: walking, yard work, pet sitting - walks dogs Dietary issues discussed:  She recently saw a nutritionist,  She tries to avoid sugars/carbs  Cardiac risk factors: advanced age, hypertension, hyperlipidemia, and obesity (BMI >= 30 kg/m2).  Depression  Screen  Have you felt down, depressed or hopeless? No  Have you felt little interest or pleasure in doing things?  No  Activities of Daily Living In your present state of health, do you have any difficulty performing the following activities?:  Driving? No Managing money?  No Feeding yourself? No Getting from bed to chair? No Climbing a flight of stairs? No Preparing food and eating?: No Bathing or showering? No Getting dressed: No Getting to/using the toilet? No Moving around from place to place: No In the past year have you fallen or had a near fall?: No   Are you sexually active?  No  Do you have more than one partner?  N/A  Hearing Difficulties: No Do you often ask people to speak up or repeat themselves? No Do you experience ringing or noises in your ears? No Do you have difficulty understanding soft or whispered voices? No Vision:              Any change in vision:  no             Up to date with eye exam:   Up to date  Memory:  Do you feel that you have a problem with memory? No  Do you often misplace items? No  Do you feel safe at home?  Yes  Cognitive Testing  Alert, Orientated? Yes  Normal Appearance? Yes  Recall of three objects?  Yes  Can perform simple calculations? Yes  Displays appropriate judgment?  Yes  Can read the correct time from a watch face? Yes   Advanced Directives have been discussed with the patient? Yes   Medications and allergies reviewed with patient and updated if appropriate.  Patient Active Problem List   Diagnosis Date Noted  . Cat bite of forearm 02/04/2016  . Generalized osteoarthritis of hand 02/04/2016  . Diabetic polyneuropathy associated with diabetes mellitus due to underlying condition ([place]) 11/27/2015  . Numbness and tingling of leg 10/09/2015  . Sessile colonic polyp 08/19/2015  . Hiatal hernia, large 08/02/2015  . Colon polyps 08/02/2015  . Anemia, unspecified 04/21/2015  . Diabetes type 2, controlled ([place]) 05/03/2014   . Hyperlipidemia, mixed 03/18/2014  . Epistaxis 09/06/2013  . Headache(784.0) 09/06/2013  . Lymphadenopathy 08/22/2012  . Interstitial cystitis 07/08/2012  . Essential hypertension 02/10/2011    Current Outpatient Prescriptions on File Prior to Visit  Medication Sig Dispense Refill  . Alum & Mag Hydroxide-Simeth (ANTACID ADVANCED PO) Take 1 tablet by mouth daily as needed (heartburn).     . Blood Glucose Monitoring Suppl (CVS BLOOD GLUCOSE METER) W/DEVICE KIT Use to test blood sugar twice daily ICD 10 E11 9 1 kit 0  . CVS LANCETS THIN MISC Use to test blood sugar twice daily ICD 19 E11 9 100 each 3  . dapagliflozin propanediol (FARXIGA) 5 MG TABS tablet Take 5 mg by mouth daily. 30 tablet 11  . gabapentin (NEURONTIN) 300 MG capsule Take 2 capsules (600 mg total) by mouth at bedtime. 180 capsule 3  . glucose blood ([facility]S ADVANCED GLUCOSE TEST) test strip Test blood sugar twice daily. DX E11.9 100 each 1  . losartan (COZAAR) 100 MG tablet Take 1 tablet (100 mg total) by mouth daily. 90 tablet 3  . metFORMIN (GLUCOPHAGE-XR) 500 MG 24 hr tablet Take 1 tablet (500 mg total) by mouth daily. 90 tablet 3  . Meth-Hyo-M Bl-Na Phos-Ph Sal (URIBEL) 118 MG CAPS Take 1 capsule by mouth 2 (two) times daily as needed.  3  . Multiple Vitamin (MULTIVITAMIN WITH MINERALS) TABS tablet Take by mouth every morning.    . multivitamin-lutein (OCUVITE-LUTEIN) CAPS capsule Take 1 capsule by mouth daily.    . ranitidine (ZANTAC) 150 MG tablet Take 150 mg by mouth daily.     . Simethicone (GAS-X PO) Take 1 tablet by mouth daily as needed (gas.).     Marland Kitchen sitaGLIPtin (JANUVIA) 100 MG tablet Take 1 tablet (100 mg total) by mouth daily. 90 tablet 3  . vitamin B-12 (CYANOCOBALAMIN) 250 MCG tablet Take 5,000 mcg by mouth daily.      No current facility-administered medications on file prior to visit.     Past Medical History:  Diagnosis Date  . Anemia   . Colon polyps   . Diabetes mellitus without complication ([place])    . Environmental allergies   . GERD (gastroesophageal reflux disease)   . Heart murmur   . History of DVT (deep vein thrombosis) 1970'S  . History of hiatal hernia   . History of transfusion   . Hyperlipidemia   . Hypertension   . Interstitial cystitis    Dr Janice Norrie  . Shortness of breath dyspnea    "due to low hemaglobin"    Past Surgical History:  Procedure Laterality Date  . APPENDECTOMY    . BILATERAL SALPINGOOPHORECTOMY     painful cysts  . COLONOSCOPY  2005   negative; Dr Earlean Shawl. F/U declined  . CYSTOSCOPY     X 4; Dr Janice Norrie  .  LAPAROSCOPIC RIGHT COLECTOMY Right 08/19/2015   Procedure: LAPAROSCOPIC ASSISTED  RIGHT COLECTOMY;  Surgeon: Alphonsa Overall, MD;  Location: [facility];  Service: General;  Laterality: Right;  . PREMAGLINANT POLYPS    . TOTAL ABDOMINAL HYSTERECTOMY     metromenorrhagia    Social History   Social History  . Marital status: Widowed    Spouse name: N/A  . Number of children: N/A  . Years of education: N/A   Social History Main Topics  . Smoking status: Former Smoker    Quit date: 05/18/1984  . Smokeless tobacco: Never Used     Comment: smoked 1972-1986, up to 1 ppd  . Alcohol use No  . Drug use: No  . Sexual activity: Not on file   Other Topics Concern  . Not on file   Social History Narrative   Lives with brother, daughter and 2 grandchildren in a 3 story home.  Has 2 children.  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     Highest level of education:  2 years of graduate school    Family History  Problem Relation Age of Onset  . Heart attack Mother     in 7s  . Hypertension Mother   . Stroke Mother      in 81s  . Stroke Maternal Grandfather 50  . Diabetes Neg Hx   . Heart attack Paternal Grandfather     in 75s  . Breast cancer Sister   . Other Father     Deceased, 38  . Healthy Brother   . Healthy Son   . Healthy Daughter     Review of Systems  Constitutional: Negative for fever.   HENT: Negative for hearing loss and tinnitus.   Eyes: Negative for visual disturbance.  Respiratory: Negative for cough, shortness of breath and wheezing.   Cardiovascular: Negative for chest pain, palpitations and leg swelling.  Gastrointestinal: Negative for abdominal pain.  Genitourinary: Negative for dysuria and hematuria.  Neurological: Negative for dizziness, light-headedness and headaches.  Psychiatric/Behavioral: Negative for dysphoric mood. The patient is not nervous/anxious.        Objective:   Vitals:   02/04/16 0825  BP: 134/90  Pulse: 66  Resp: 16  Temp: 98.3 F (36.8 C)   Filed Weights   02/04/16 0825  Weight: 191 lb (86.6 kg)   Body mass index is 34.93 kg/m.   Physical Exam Constitutional: Appears well-developed and well-nourished. No distress.  HENT:  Head: Normocephalic and atraumatic. Normal ear canals and TM bilaterally.  Oropharynx moist and without erythema.  Neck: Neck supple. No tracheal deviation present. No thyromegaly present.  Cardiovascular: Normal rate, regular rhythm and normal heart sounds.   No murmur heard. No carotid bruit  Pulmonary/Chest: Effort normal and breath sounds normal. No respiratory distress. No has no wheezes. No rales.  Abdomen:  Soft, non-tender, non-distended Musculoskeletal: No edema.  Lymphadenopathy: No cervical adenopathy.  Skin: Skin is warm and dry. Not diaphoretic. cat scratch or bite without surrounding swelling or erythema to indicate an infection Psychiatric: Normal mood and affect. Behavior is normal.       Assessment & Plan:   Wellness Exam: Immunizations  Flu vaccine today, tdap today, other vaccines up to date Colonoscopy   Up to date  Mammogram   Up to date  Dexa - has not had one in a long time - ordered today Gyn  - no longer sees Eye exam  Up  to date  Hearing loss  none Memory concerns/difficulties   none Independent of ADLs  Fully  Stressed the importance of regular exercise   Patient  received copy of preventative screening tests/immunizations recommended for the next 5-10 years.   See Problem List for Assessment and Plan of chronic medical problems.   Follow up in 6 months

## 2016-02-04 NOTE — Progress Notes (Signed)
Pre visit review using our clinic review tool, if applicable. No additional management support is needed unless otherwise documented below in the visit note. 

## 2016-02-04 NOTE — Assessment & Plan Note (Addendum)
Management per Dr Loanne Drilling  Lab Results  Component Value Date   HGBA1C 6.9 01/30/2016   Exercising, working on weight loss, eating a low sugar/carb diet

## 2016-02-04 NOTE — Assessment & Plan Note (Signed)
It was her own cat - who is up to date with the vaccines No evidence of infection tdap today Monitor for signs of infection

## 2016-02-05 LAB — HEPATITIS C ANTIBODY: HCV Ab: NEGATIVE

## 2016-02-08 ENCOUNTER — Encounter: Payer: Self-pay | Admitting: Internal Medicine

## 2016-02-11 ENCOUNTER — Encounter: Payer: Self-pay | Admitting: Internal Medicine

## 2016-02-13 ENCOUNTER — Encounter: Payer: Self-pay | Admitting: Internal Medicine

## 2016-03-17 ENCOUNTER — Other Ambulatory Visit: Payer: Self-pay | Admitting: Internal Medicine

## 2016-03-24 DIAGNOSIS — H10412 Chronic giant papillary conjunctivitis, left eye: Secondary | ICD-10-CM | POA: Diagnosis not present

## 2016-03-31 DIAGNOSIS — H10412 Chronic giant papillary conjunctivitis, left eye: Secondary | ICD-10-CM | POA: Diagnosis not present

## 2016-04-11 ENCOUNTER — Other Ambulatory Visit: Payer: Self-pay | Admitting: Internal Medicine

## 2016-04-15 ENCOUNTER — Other Ambulatory Visit: Payer: Self-pay | Admitting: Internal Medicine

## 2016-04-16 ENCOUNTER — Other Ambulatory Visit: Payer: Self-pay | Admitting: Emergency Medicine

## 2016-04-16 ENCOUNTER — Telehealth: Payer: Self-pay | Admitting: Internal Medicine

## 2016-04-16 DIAGNOSIS — H66012 Acute suppurative otitis media with spontaneous rupture of ear drum, left ear: Secondary | ICD-10-CM | POA: Diagnosis not present

## 2016-04-16 MED ORDER — GLUCOSE BLOOD VI STRP: ORAL_STRIP | 1 refills | Status: DC

## 2016-04-16 NOTE — Telephone Encounter (Signed)
Patient will need an office visit to be evaluated appropriately as I do not want to suggest treatments that may make it worse.

## 2016-04-16 NOTE — Telephone Encounter (Signed)
Patient Name: Heather [name]  DOB: [birth year]    Initial Comment Caller states she has had an earache and cannot hear out of the ear. The caller has not slept at all and would like an appt today.   Nurse Assessment  Nurse: Raphael Gibney, RN, Vanita Ingles Date/Time Eilene Ghazi Time): 04/16/2016 11:26:46 AM  Confirm and document reason for call. If symptomatic, describe symptoms. ---Caller states she has earache in her left ear. She has been to the urgent care. She can not hear of her left ear. She was prescribed augmentin. Her balance is a little off. Her left eardrum has burst.  Does the patient have any new or worsening symptoms? ---Yes  Will a triage be completed? ---No  Select reason for no triage. ---Other  Please document clinical information provided and list any resource used. ---She has already been to the urgent care.     Guidelines    Guideline Title Affirmed Question Affirmed Notes       Final Disposition User   Clinical Call Snook, RN, Vanita Ingles

## 2016-04-16 NOTE — Telephone Encounter (Signed)
Spoke with pt, she was seen at urgent care this morning. She will call office to follow-up once the antibiotic is complete.

## 2016-04-16 NOTE — Telephone Encounter (Signed)
Please advise as Dr Quay Burow is not in the office today.

## 2016-04-20 ENCOUNTER — Ambulatory Visit (INDEPENDENT_AMBULATORY_CARE_PROVIDER_SITE_OTHER): Payer: Medicare Other | Admitting: Internal Medicine

## 2016-04-20 ENCOUNTER — Encounter: Payer: Self-pay | Admitting: Internal Medicine

## 2016-04-20 VITALS — BP 130/80 | HR 74 | Temp 97.8°F | Wt 195.0 lb

## 2016-04-20 DIAGNOSIS — H7292 Unspecified perforation of tympanic membrane, left ear: Secondary | ICD-10-CM | POA: Diagnosis not present

## 2016-04-20 DIAGNOSIS — H6692 Otitis media, unspecified, left ear: Secondary | ICD-10-CM

## 2016-04-20 MED ORDER — OFLOXACIN 0.3 % OT SOLN: 5.0000 [drp] | Freq: Every day | OTIC | 0 refills | Status: DC

## 2016-04-20 NOTE — Progress Notes (Signed)
Subjective:    Patient ID: Heather [name], female    DOB: [birth year], 65 y.o.   MRN: [id number]  HPI She is here for an acute visit for an ear infection and decreased hearing.   She is afraid she is losing her hearing.  She went to urgent care last Thursday and was told she had an ear infection and possible ear drum rupture.  Her symptoms started three days prior to being seen and the night before she went to urgent care she has severe pain in her ear.  She was prescribed augmentin.  She is taking advil for the pain.  She has been applying moist heat to the outside of her left ear.  The pain has improved, but she has green discharge.  She can not hear anything out of her left ear.   She does not feel good. She feels warm at times. Her left eye is weeping.  She denies symptoms in her right ear, nasal congestion or sore throat.  She is not having any headaches, but does have some dizziness.       Medications and allergies reviewed with patient and updated if appropriate.  Patient Active Problem List   Diagnosis Date Noted  . Cat bite of forearm 02/04/2016  . Generalized osteoarthritis of hand 02/04/2016  . Diabetic polyneuropathy associated with diabetes mellitus due to underlying condition ([place]) 11/27/2015  . Numbness and tingling of leg 10/09/2015  . Sessile colonic polyp 08/19/2015  . Hiatal hernia, large 08/02/2015  . Colon polyps 08/02/2015  . Anemia, unspecified 04/21/2015  . Diabetes type 2, controlled ([place]) 05/03/2014  . Hyperlipidemia, mixed 03/18/2014  . Epistaxis 09/06/2013  . Interstitial cystitis 07/08/2012  . Essential hypertension 02/10/2011    Current Outpatient Prescriptions on File Prior to Visit  Medication Sig Dispense Refill  . Alum & Mag Hydroxide-Simeth (ANTACID ADVANCED PO) Take 1 tablet by mouth daily as needed (heartburn).     . Blood Glucose Monitoring Suppl ([employer]S BLOOD GLUCOSE METER) W/DEVICE KIT Use to test blood sugar twice daily ICD 10 E11 9 1 kit  0  . [facility]S LANCETS THIN MISC Use to test blood sugar twice daily ICD 19 E11 9 100 each 3  . dapagliflozin propanediol (FARXIGA) 5 MG TABS tablet Take 5 mg by mouth daily. 30 tablet 11  . gabapentin (NEURONTIN) 300 MG capsule Take 2 capsules (600 mg total) by mouth at bedtime. 180 capsule 3  . glucose blood ([employer]S ADVANCED GLUCOSE TEST) test strip TEST BLOOD SUGAR TWICE DAILY-- DX E11.42 100 each 1  . losartan (COZAAR) 100 MG tablet Take 1 tablet (100 mg total) by mouth daily. 90 tablet 3  . metFORMIN (GLUCOPHAGE-XR) 500 MG 24 hr tablet Take 1 tablet (500 mg total) by mouth daily. 90 tablet 3  . Meth-Hyo-M Bl-Na Phos-Ph Sal (URIBEL) 118 MG CAPS Take 1 capsule by mouth 2 (two) times daily as needed.  3  . Multiple Vitamin (MULTIVITAMIN WITH MINERALS) TABS tablet Take by mouth every morning.    . multivitamin-lutein (OCUVITE-LUTEIN) CAPS capsule Take 1 capsule by mouth daily.    . ranitidine (ZANTAC) 150 MG tablet Take 150 mg by mouth daily.     . Red Yeast Rice 600 MG TABS Take 1,200 mg by mouth daily.    . Simethicone (GAS-X PO) Take 1 tablet by mouth daily as needed (gas.).     Marland Kitchen sitaGLIPtin (JANUVIA) 100 MG tablet Take 1 tablet (100 mg total) by mouth daily. 90 tablet 3  .  tiZANidine (ZANAFLEX) 2 MG tablet Take 1 tablet (2 mg total) by mouth at bedtime as needed for muscle spasms. 90 tablet 3  . vitamin B-12 (CYANOCOBALAMIN) 250 MCG tablet Take 5,000 mcg by mouth daily.      No current facility-administered medications on file prior to visit.     Past Medical History:  Diagnosis Date  . Anemia   . Colon polyps   . Diabetes mellitus without complication ([place])   . Environmental allergies   . GERD (gastroesophageal reflux disease)   . Heart murmur   . History of DVT (deep vein thrombosis) 1970'S  . History of hiatal hernia   . History of transfusion   . Hyperlipidemia   . Hypertension   . Interstitial cystitis    Dr Janice Norrie  . Shortness of breath dyspnea    "due to low hemaglobin"     Past Surgical History:  Procedure Laterality Date  . APPENDECTOMY    . BILATERAL SALPINGOOPHORECTOMY     painful cysts  . COLONOSCOPY  2005   negative; Dr Earlean Shawl. F/U declined  . CYSTOSCOPY     X 4; Dr Janice Norrie  . LAPAROSCOPIC RIGHT COLECTOMY Right 08/19/2015   Procedure: LAPAROSCOPIC ASSISTED  RIGHT COLECTOMY;  Surgeon: Alphonsa Overall, MD;  Location: [facility];  Service: General;  Laterality: Right;  . PREMAGLINANT POLYPS    . TOTAL ABDOMINAL HYSTERECTOMY     metromenorrhagia    Social History   Social History  . Marital status: Widowed    Spouse name: N/A  . Number of children: N/A  . Years of education: N/A   Social History Main Topics  . Smoking status: Former Smoker    Quit date: 05/18/1984  . Smokeless tobacco: Never Used     Comment: smoked 1972-1986, up to 1 ppd  . Alcohol use No  . Drug use: No  . Sexual activity: Not Asked   Other Topics Concern  . None   Social History Narrative   Lives with brother, daughter and 2 grandchildren in a 3 story home.  Has 2 children.  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     Highest level of education:  2 years of graduate school    Family History  Problem Relation Age of Onset  . Heart attack Mother     in 21s  . Hypertension Mother   . Stroke Mother      in 78s  . Stroke Maternal Grandfather 50  . Diabetes Neg Hx   . Heart attack Paternal Grandfather     in 34s  . Breast cancer Sister   . Other Father     Deceased, 88  . Healthy Brother   . Healthy Son   . Healthy Daughter     Review of Systems  Constitutional: Positive for fatigue. Negative for fever (feels warm).  HENT: Positive for ear discharge and ear pain. Negative for congestion, sinus pain and sore throat.   Respiratory: Negative for cough, shortness of breath and wheezing.   Neurological: Positive for dizziness. Negative for light-headedness and headaches.       Objective:   Vitals:   04/20/16 1553   BP: 130/80  Pulse: 74  Temp: 97.8 F (36.6 C)   Filed Weights   04/20/16 1553  Weight: 195 lb (88.5 kg)   Body mass index is 35.67 kg/m.   Physical Exam  Constitutional: She appears well-developed and well-nourished. No distress.  HENT:  Head: Normocephalic and atraumatic.  Right Ear: External ear normal.  Left Ear: External ear normal.  Mouth/Throat: Oropharynx is clear and moist. No oropharyngeal exudate.  Left ear canal with mild swelling, but no erythema.  TM covered with greenish-white liquid, TM not visualized.  Right ear canal and TM normal.  Eyes: Conjunctivae are normal.  Neck: Neck supple. No tracheal deviation present. No thyromegaly present.  Cardiovascular: Normal rate, regular rhythm and normal heart sounds.   Pulmonary/Chest: Effort normal and breath sounds normal. No respiratory distress. She has no wheezes. She has no rales.  Lymphadenopathy:    She has no cervical adenopathy.  Skin: Skin is warm and dry. She is not diaphoretic.       Assessment & Plan:   See Problem List for Assessment and Plan of chronic medical problems.

## 2016-04-20 NOTE — Patient Instructions (Signed)
   Medications reviewed and updated.  Changes include starting ofloxacin in your ear.  Continue the Augmentin as prescribed.    Your prescription(s) have been submitted to your pharmacy. Please take as directed and contact our office if you believe you are having problem(s) with the medication(s).   A referral was ordered for ENT.

## 2016-04-20 NOTE — Assessment & Plan Note (Signed)
Otitis media with probable TM rupture Augmentin has helped and her discharge and pain has improved Complete Augmentin Continue Advil Will add Ofloxacin which is not ototoxic Will refer to ENT - advised her there may not be much they can do, but she has lost her hearing out of her left ear and is very concerned.  Hopefully as infection clears the TM will repair itself and some of her hearing will return

## 2016-04-22 DIAGNOSIS — H6123 Impacted cerumen, bilateral: Secondary | ICD-10-CM | POA: Diagnosis not present

## 2016-04-22 DIAGNOSIS — H60333 Swimmer's ear, bilateral: Secondary | ICD-10-CM | POA: Diagnosis not present

## 2016-04-23 ENCOUNTER — Encounter: Payer: Self-pay | Admitting: Internal Medicine

## 2016-04-24 ENCOUNTER — Telehealth: Payer: Self-pay

## 2016-04-24 NOTE — Telephone Encounter (Signed)
Pt is on DM eye exam list. It shows that insurance may have paid a claim but we do not have the notes from the eye exam to abstract appropriately.   Pt may have been to see Dr. Camillo Flaming O.D around January 2017. There number is [phone]

## 2016-04-28 DIAGNOSIS — H10412 Chronic giant papillary conjunctivitis, left eye: Secondary | ICD-10-CM | POA: Diagnosis not present

## 2016-04-30 NOTE — Telephone Encounter (Signed)
Spoke to receptionist at Dr. Marylouise Stacks office. She is refaxing to [place]. The exam has been abstracted since the start of this note.

## 2016-06-01 NOTE — Progress Notes (Signed)
Subjective:    Patient ID: Heather [name], female    DOB: 29-Aug-1950, 66 y.o.   MRN: [id number]  HPI Pt returns for f/u of diabetes mellitus: DM type: 2 Dx'ed: 9323 Complications: polyneuropathy Therapy: 3 oral meds GDM: never DKA: never Severe hypoglycemia: never Pancreatitis: never Other: she has never been on insulin; short bowel syndrome limits metformin dosage.   Interval history: she brings a record of her cbg's which I have reviewed today. She checks fasting only.  It varies from 97-168. pt states she feels well in general. She has recurrent vaginitis since on farxiga.   Past Medical History:  Diagnosis Date  . Anemia   . Colon polyps   . Diabetes mellitus without complication ([place])   . Environmental allergies   . GERD (gastroesophageal reflux disease)   . Heart murmur   . History of DVT (deep vein thrombosis) 1970'S  . History of hiatal hernia   . History of transfusion   . Hyperlipidemia   . Hypertension   . Interstitial cystitis    Dr Janice Norrie  . Shortness of breath dyspnea    "due to low hemaglobin"    Past Surgical History:  Procedure Laterality Date  . APPENDECTOMY    . BILATERAL SALPINGOOPHORECTOMY     painful cysts  . COLONOSCOPY  2005   negative; Dr Earlean Shawl. F/U declined  . CYSTOSCOPY     X 4; Dr Janice Norrie  . LAPAROSCOPIC RIGHT COLECTOMY Right 08/19/2015   Procedure: LAPAROSCOPIC ASSISTED  RIGHT COLECTOMY;  Surgeon: Alphonsa Overall, MD;  Location: [facility];  Service: General;  Laterality: Right;  . PREMAGLINANT POLYPS    . TOTAL ABDOMINAL HYSTERECTOMY     metromenorrhagia    Social History   Social History  . Marital status: Widowed    Spouse name: N/A  . Number of children: N/A  . Years of education: N/A   Occupational History  . Not on file.   Social History Main Topics  . Smoking status: Former Smoker    Quit date: 05/18/1984  . Smokeless tobacco: Never Used     Comment: smoked 1972-1986, up to 1 ppd  . Alcohol use No  . Drug use: No  . Sexual  activity: Not on file   Other Topics Concern  . Not on file   Social History Narrative   Lives with brother, daughter and 2 grandchildren in a 3 story home.  Has 2 children.  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     Highest level of education:  2 years of graduate school    Current Outpatient Prescriptions on File Prior to Visit  Medication Sig Dispense Refill  . Alum & Mag Hydroxide-Simeth (ANTACID ADVANCED PO) Take 1 tablet by mouth daily as needed (heartburn).     . Blood Glucose Monitoring Suppl (CVS BLOOD GLUCOSE METER) W/DEVICE KIT Use to test blood sugar twice daily ICD 10 E11 9 1 kit 0  . CVS LANCETS THIN MISC Use to test blood sugar twice daily ICD 19 E11 9 100 each 3  . gabapentin (NEURONTIN) 300 MG capsule Take 2 capsules (600 mg total) by mouth at bedtime. 180 capsule 3  . glucose blood (CVS ADVANCED GLUCOSE TEST) test strip TEST BLOOD SUGAR TWICE DAILY-- DX E11.42 100 each 1  . losartan (COZAAR) 100 MG tablet Take 1 tablet (100 mg total) by mouth daily. 90 tablet 3  . metFORMIN (GLUCOPHAGE-XR) 500 MG 24 hr  tablet Take 1 tablet (500 mg total) by mouth daily. 90 tablet 3  . Meth-Hyo-M Bl-Na Phos-Ph Sal (URIBEL) 118 MG CAPS Take 1 capsule by mouth 2 (two) times daily as needed.  3  . Multiple Vitamin (MULTIVITAMIN WITH MINERALS) TABS tablet Take by mouth every morning.    . multivitamin-lutein (OCUVITE-LUTEIN) CAPS capsule Take 1 capsule by mouth daily.    Marland Kitchen ofloxacin (FLOXIN OTIC) 0.3 % otic solution Place 5 drops into the left ear daily. 5 mL 0  . ranitidine (ZANTAC) 150 MG tablet Take 150 mg by mouth daily.     . Red Yeast Rice 600 MG TABS Take 1,200 mg by mouth daily.    . Simethicone (GAS-X PO) Take 1 tablet by mouth daily as needed (gas.).     Marland Kitchen tiZANidine (ZANAFLEX) 2 MG tablet Take 1 tablet (2 mg total) by mouth at bedtime as needed for muscle spasms. 90 tablet 3  . vitamin B-12 (CYANOCOBALAMIN) 250 MCG tablet Take  5,000 mcg by mouth daily.      No current facility-administered medications on file prior to visit.     Allergies  Allergen Reactions  . Flagyl [Metronidazole] Other (See Comments)    Made really sick. Was able to tolerate dose 4/17 with zofran  . Lisinopril     04/03/15 cough reported X several months  . Sulfonamide Derivatives     nausea    Family History  Problem Relation Age of Onset  . Heart attack Mother     in 35s  . Hypertension Mother   . Stroke Mother      in 60s  . Stroke Maternal Grandfather 50  . Diabetes Neg Hx   . Heart attack Paternal Grandfather     in 67s  . Breast cancer Sister   . Other Father     Deceased, 26  . Healthy Brother   . Healthy Son   . Healthy Daughter     BP 140/86   Pulse 70   Ht 5' 2" (1.575 m)   Wt 193 lb (87.5 kg)   SpO2 98%   BMI 35.30 kg/m     Review of Systems She denies hypoglycemia    Objective:   Physical Exam VITAL SIGNS:  See vs page GENERAL: no distress Pulses: dorsalis pedis intact bilat.   MSK: no deformity of the feet CV: no leg edema Skin:  no ulcer on the feet, but there are heavy calluses.  normal color and temp on the feet. Neuro: sensation is intact to touch on the feet. Ext; There is bilateral onychomycosis of the toenails.     A1c=7.2%    Assessment & Plan:  Type 2 DM, with polyneuropathy, worse. Edema: better on farxiga Vaginitis, new, prob due to [place] bowel syndrome.  This limits metformin dosage HTN: she'll need to d/c HCTZ if she starts farxiga, to prevent hypovolemia. Patient is advised the following: Patient Instructions  check your blood sugar once a day.  vary the time of day when you check, between before the 3 meals, and at bedtime.  also check if you have symptoms of your blood sugar being too high or too low.  please keep a record of the readings and bring it to your next appointment here (or you can bring the meter itself).  You can write it on any piece of paper.   please call us sooner if your blood sugar goes below 70, or if you have a lot of readings over 200.  I have sent a prescription to your pharmacy, to change farxiga to "repaglinide."  Also, I have sent a prescription to your pharmacy, to resume the HCTZ.   Please continue the same other diabetes medications. Please come back for a follow-up appointment in 4 months.

## 2016-06-02 ENCOUNTER — Encounter: Payer: Self-pay | Admitting: Endocrinology

## 2016-06-02 ENCOUNTER — Ambulatory Visit (INDEPENDENT_AMBULATORY_CARE_PROVIDER_SITE_OTHER): Payer: Medicare Other | Admitting: Endocrinology

## 2016-06-02 VITALS — BP 140/86 | HR 70 | Ht 62.0 in | Wt 193.0 lb

## 2016-06-02 DIAGNOSIS — I10 Essential (primary) hypertension: Secondary | ICD-10-CM | POA: Diagnosis not present

## 2016-06-02 DIAGNOSIS — E1142 Type 2 diabetes mellitus with diabetic polyneuropathy: Secondary | ICD-10-CM

## 2016-06-02 LAB — POCT GLYCOSYLATED HEMOGLOBIN (HGB A1C): HEMOGLOBIN A1C: 7.2

## 2016-06-02 MED ORDER — SITAGLIPTIN PHOSPHATE 100 MG PO TABS: 100.0000 mg | ORAL_TABLET | Freq: Every day | ORAL | 3 refills | Status: DC

## 2016-06-02 MED ORDER — REPAGLINIDE 1 MG PO TABS: 1.0000 mg | ORAL_TABLET | Freq: Three times a day (TID) | ORAL | 11 refills | Status: DC

## 2016-06-02 MED ORDER — HYDROCHLOROTHIAZIDE 12.5 MG PO CAPS: 12.5000 mg | ORAL_CAPSULE | Freq: Every day | ORAL | 1 refills | Status: DC

## 2016-06-02 NOTE — Patient Instructions (Addendum)
check your blood sugar once a day.  vary the time of day when you check, between before the 3 meals, and at bedtime.  also check if you have symptoms of your blood sugar being too high or too low.  please keep a record of the readings and bring it to your next appointment here (or you can bring the meter itself).  You can write it on any piece of paper.  please call us sooner if your blood sugar goes below 70, or if you have a lot of readings over 200.  I have sent a prescription to your pharmacy, to change farxiga to "repaglinide."  Also, I have sent a prescription to your pharmacy, to resume the HCTZ.   Please continue the same other diabetes medications. Please come back for a follow-up appointment in 4 months.

## 2016-06-19 DIAGNOSIS — H10412 Chronic giant papillary conjunctivitis, left eye: Secondary | ICD-10-CM | POA: Diagnosis not present

## 2016-06-19 DIAGNOSIS — E119 Type 2 diabetes mellitus without complications: Secondary | ICD-10-CM | POA: Diagnosis not present

## 2016-06-19 DIAGNOSIS — H2513 Age-related nuclear cataract, bilateral: Secondary | ICD-10-CM | POA: Diagnosis not present

## 2016-07-08 DIAGNOSIS — H60333 Swimmer's ear, bilateral: Secondary | ICD-10-CM | POA: Diagnosis not present

## 2016-07-08 DIAGNOSIS — H6123 Impacted cerumen, bilateral: Secondary | ICD-10-CM | POA: Diagnosis not present

## 2016-07-20 LAB — HM DIABETES EYE EXAM

## 2016-07-21 ENCOUNTER — Telehealth: Payer: Self-pay | Admitting: Endocrinology

## 2016-07-21 MED ORDER — NATEGLINIDE 120 MG PO TABS: 120.0000 mg | ORAL_TABLET | Freq: Three times a day (TID) | ORAL | 11 refills | Status: DC

## 2016-07-21 NOTE — Telephone Encounter (Signed)
See message and please advise, Thanks!  

## 2016-07-21 NOTE — Telephone Encounter (Signed)
I have sent a prescription to your pharmacy, for an alternative.

## 2016-07-21 NOTE — Telephone Encounter (Signed)
Patient stated medication repaglinide (PRANDIN) 1 MG tablet is making her sick, she has stopped taking it, and would like to try something else.  Please advise

## 2016-07-21 NOTE — Telephone Encounter (Signed)
I contacted the patient and advised of message. Patient voiced understanding and had no further questions at this time.  

## 2016-08-03 ENCOUNTER — Other Ambulatory Visit: Payer: Self-pay | Admitting: Internal Medicine

## 2016-08-03 DIAGNOSIS — I10 Essential (primary) hypertension: Secondary | ICD-10-CM

## 2016-08-04 ENCOUNTER — Other Ambulatory Visit (INDEPENDENT_AMBULATORY_CARE_PROVIDER_SITE_OTHER): Payer: Medicare Other

## 2016-08-04 ENCOUNTER — Encounter: Payer: Self-pay | Admitting: Internal Medicine

## 2016-08-04 ENCOUNTER — Telehealth: Payer: Self-pay | Admitting: *Deleted

## 2016-08-04 ENCOUNTER — Ambulatory Visit (INDEPENDENT_AMBULATORY_CARE_PROVIDER_SITE_OTHER): Payer: Medicare Other | Admitting: Internal Medicine

## 2016-08-04 VITALS — BP 124/86 | HR 74 | Temp 98.2°F | Resp 16 | Ht 62.0 in | Wt 196.0 lb

## 2016-08-04 DIAGNOSIS — I10 Essential (primary) hypertension: Secondary | ICD-10-CM

## 2016-08-04 DIAGNOSIS — E1142 Type 2 diabetes mellitus with diabetic polyneuropathy: Secondary | ICD-10-CM

## 2016-08-04 DIAGNOSIS — E782 Mixed hyperlipidemia: Secondary | ICD-10-CM

## 2016-08-04 DIAGNOSIS — E669 Obesity, unspecified: Secondary | ICD-10-CM | POA: Insufficient documentation

## 2016-08-04 DIAGNOSIS — R0609 Other forms of dyspnea: Secondary | ICD-10-CM

## 2016-08-04 DIAGNOSIS — E0842 Diabetes mellitus due to underlying condition with diabetic polyneuropathy: Secondary | ICD-10-CM

## 2016-08-04 DIAGNOSIS — R0789 Other chest pain: Secondary | ICD-10-CM

## 2016-08-04 DIAGNOSIS — Z6835 Body mass index (BMI) 35.0-35.9, adult: Secondary | ICD-10-CM

## 2016-08-04 DIAGNOSIS — IMO0001 Reserved for inherently not codable concepts without codable children: Secondary | ICD-10-CM

## 2016-08-04 DIAGNOSIS — E6609 Other obesity due to excess calories: Secondary | ICD-10-CM

## 2016-08-04 LAB — COMPREHENSIVE METABOLIC PANEL
ALBUMIN: 4.3 g/dL (ref 3.5–5.2)
ALK PHOS: 56 U/L (ref 39–117)
ALT: 27 U/L (ref 0–35)
AST: 18 U/L (ref 0–37)
BUN: 21 mg/dL (ref 6–23)
CHLORIDE: 101 meq/L (ref 96–112)
CO2: 27 mEq/L (ref 19–32)
Calcium: 10.2 mg/dL (ref 8.4–10.5)
Creatinine, Ser: 0.88 mg/dL (ref 0.40–1.20)
GFR: 67.95 mL/min (ref >60.00)
Glucose, Bld: 139 mg/dL — ABNORMAL HIGH (ref 70–99)
POTASSIUM: 4.6 meq/L (ref 3.5–5.1)
SODIUM: 137 meq/L (ref 135–145)
TOTAL PROTEIN: 7.3 g/dL (ref 6.0–8.3)
Total Bilirubin: 0.8 mg/dL (ref 0.2–1.2)

## 2016-08-04 LAB — LIPID PANEL
Cholesterol: 216 mg/dL — ABNORMAL HIGH (ref 0–200)
HDL: 52.7 mg/dL (ref >39.00)
NonHDL: 162.84
TRIGLYCERIDES: 206 mg/dL — AB (ref 0.0–149.0)
Total CHOL/HDL Ratio: 4
VLDL: 41.2 mg/dL — AB (ref 0.0–40.0)

## 2016-08-04 LAB — LDL CHOLESTEROL, DIRECT: Direct LDL: 140 mg/dL

## 2016-08-04 LAB — HEMOGLOBIN A1C: Hgb A1c MFr Bld: 7.6 % — ABNORMAL HIGH (ref 4.6–6.5)

## 2016-08-04 LAB — TSH: TSH: 1.82 u[IU]/mL (ref 0.35–4.50)

## 2016-08-04 MED ORDER — HYDROCHLOROTHIAZIDE 12.5 MG PO CAPS: 12.5000 mg | ORAL_CAPSULE | Freq: Every day | ORAL | 1 refills | Status: DC

## 2016-08-04 NOTE — Assessment & Plan Note (Signed)
With activity and laying down Likely her weight is contributing Will order a stress test to rule out cardiac ischemia and valve problem given her murmur

## 2016-08-04 NOTE — Assessment & Plan Note (Signed)
She is very frustrated by her weight Will discuss trying victoza with Dr Loanne Drilling Stressed 30 min of exercise outside of [employer] work and house work Increase calories - eating less than 1000 calories a day she says - advised not going below 1200

## 2016-08-04 NOTE — Assessment & Plan Note (Signed)
Sugars have been higher Will discuss medication changes with Dr Loanne Drilling - she is interested in trying victoza Increase activity - 30 minutes a day outside of daily activity

## 2016-08-04 NOTE — Progress Notes (Signed)
Subjective:    Patient ID: Heather [name], female    DOB: [birth year], 66 y.o.   MRN: [id number]  HPI The patient is here for follow up.  She has gained weight and feels it is related to her diabetes medication.  She watches what she eats and does not eat a lot.  When it is nice she is doing yard work and doing housework.    Hypertension: She is taking her medication daily. She is compliant with a low sodium diet.  She denies chest pain, palpitations, edema, shortness of breath and regular headaches. She is not exercising regularly.     Hyperlipidemia: She is taking her medication daily. She is compliant with a low fat/cholesterol diet. She is not exercising regularly. She denies myalgias.   Diabetes: She is following with Dr Loanne Drilling.  She is taking her medication daily as prescribed. She is compliant with a diabetic diet. She is not exercising regularly - she does yard work and house work. She monitors her sugars and they have been running a little higher - 153 this morning. She checks her feet daily and denies foot lesions. She is up-to-date with an ophthalmology examination.   Diabetic peripheral neuropathy:  She is taking the gabapentin daily.  She feels her nerve pain is controlled.   She had an episode of chest pain when she was very upset about something.  She has not had any recurrences. She does feel SOB with activity and feels that is related to her weight.     Medications and allergies reviewed with patient and updated if appropriate.  Patient Active Problem List   Diagnosis Date Noted  . Otitis media with rupture of tympanic membrane, left 04/20/2016  . Generalized osteoarthritis of hand 02/04/2016  . Diabetic polyneuropathy associated with diabetes mellitus due to underlying condition ([place]) 11/27/2015  . Numbness and tingling of leg 10/09/2015  . Sessile colonic polyp 08/19/2015  . Hiatal hernia, large 08/02/2015  . Colon polyps 08/02/2015  . Anemia, unspecified  04/21/2015  . Diabetes type 2, controlled ([place]) 05/03/2014  . Hyperlipidemia, mixed 03/18/2014  . Epistaxis 09/06/2013  . Interstitial cystitis 07/08/2012  . Essential hypertension 02/10/2011    Current Outpatient Prescriptions on File Prior to Visit  Medication Sig Dispense Refill  . Alum & Mag Hydroxide-Simeth (ANTACID ADVANCED PO) Take 1 tablet by mouth daily as needed (heartburn).     . Blood Glucose Monitoring Suppl ([employer]S BLOOD GLUCOSE METER) W/DEVICE KIT Use to test blood sugar twice daily ICD 10 E11 9 1 kit 0  . CVS LANCETS THIN MISC Use to test blood sugar twice daily ICD 19 E11 9 100 each 3  . gabapentin (NEURONTIN) 300 MG capsule Take 2 capsules (600 mg total) by mouth at bedtime. 180 capsule 3  . glucose blood ([employer]S ADVANCED GLUCOSE TEST) test strip TEST BLOOD SUGAR TWICE DAILY-- DX E11.42 100 each 1  . hydrochlorothiazide (MICROZIDE) 12.5 MG capsule Take 1 capsule (12.5 mg total) by mouth daily. 90 capsule 1  . losartan (COZAAR) 100 MG tablet TAKE 1 TABLET EVERY DAY 90 tablet 1  . metFORMIN (GLUCOPHAGE-XR) 500 MG 24 hr tablet Take 1 tablet (500 mg total) by mouth daily. 90 tablet 3  . Meth-Hyo-M Bl-Na Phos-Ph Sal (URIBEL) 118 MG CAPS Take 1 capsule by mouth 2 (two) times daily as needed.  3  . Multiple Vitamin (MULTIVITAMIN WITH MINERALS) TABS tablet Take by mouth every morning.    . multivitamin-lutein (OCUVITE-LUTEIN) CAPS capsule Take 1  capsule by mouth daily.    . nateglinide (STARLIX) 120 MG tablet Take 1 tablet (120 mg total) by mouth 3 (three) times daily with meals. 90 tablet 11  . ofloxacin (FLOXIN OTIC) 0.3 % otic solution Place 5 drops into the left ear daily. (Patient taking differently: Place 5 drops into the left ear daily as needed. ) 5 mL 0  . ranitidine (ZANTAC) 150 MG tablet Take 150 mg by mouth daily.     . Red Yeast Rice 600 MG TABS Take 1,200 mg by mouth daily.    . Simethicone (GAS-X PO) Take 1 tablet by mouth daily as needed (gas.).     Marland Kitchen sitaGLIPtin  (JANUVIA) 100 MG tablet Take 1 tablet (100 mg total) by mouth daily. 90 tablet 3  . tiZANidine (ZANAFLEX) 2 MG tablet Take 1 tablet (2 mg total) by mouth at bedtime as needed for muscle spasms. 90 tablet 3  . vitamin B-12 (CYANOCOBALAMIN) 250 MCG tablet Take 5,000 mcg by mouth daily.      No current facility-administered medications on file prior to visit.     Past Medical History:  Diagnosis Date  . Anemia   . Colon polyps   . Diabetes mellitus without complication ([place])   . Environmental allergies   . GERD (gastroesophageal reflux disease)   . Heart murmur   . History of DVT (deep vein thrombosis) 1970'S  . History of hiatal hernia   . History of transfusion   . Hyperlipidemia   . Hypertension   . Interstitial cystitis    Dr Janice Norrie  . Shortness of breath dyspnea    "due to low hemaglobin"    Past Surgical History:  Procedure Laterality Date  . APPENDECTOMY    . BILATERAL SALPINGOOPHORECTOMY     painful cysts  . COLONOSCOPY  2005   negative; Dr Earlean Shawl. F/U declined  . CYSTOSCOPY     X 4; Dr Janice Norrie  . LAPAROSCOPIC RIGHT COLECTOMY Right 08/19/2015   Procedure: LAPAROSCOPIC ASSISTED  RIGHT COLECTOMY;  Surgeon: Alphonsa Overall, MD;  Location: [facility];  Service: General;  Laterality: Right;  . PREMAGLINANT POLYPS    . TOTAL ABDOMINAL HYSTERECTOMY     metromenorrhagia    Social History   Social History  . Marital status: Widowed    Spouse name: N/A  . Number of children: N/A  . Years of education: N/A   Social History Main Topics  . Smoking status: Former Smoker    Quit date: 05/18/1984  . Smokeless tobacco: Never Used     Comment: smoked 1972-1986, up to 1 ppd  . Alcohol use No  . Drug use: No  . Sexual activity: Not Asked   Other Topics Concern  . None   Social History Narrative   Lives with brother, daughter and 2 grandchildren in a 3 story home.  Has 2 children.  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     Highest  level of education:  2 years of graduate school    Family History  Problem Relation Age of Onset  . Heart attack Mother     in 58s  . Hypertension Mother   . Stroke Mother      in 46s  . Stroke Maternal Grandfather 50  . Diabetes Neg Hx   . Heart attack Paternal Grandfather     in 41s  . Breast cancer Sister   . Other Father     Deceased,  46  . Healthy Brother   . Healthy Son   . Healthy Daughter     Review of Systems  Constitutional: Negative for chills and fever.  Respiratory: Positive for cough (dry cough x months) and shortness of breath (with activity and sometimes in bed). Negative for wheezing.   Cardiovascular: Positive for chest pain (one episode - with intense stress) and palpitations (occasional). Negative for leg swelling.  Gastrointestinal:       GERD - takes tums, takes ranitidine  Neurological: Positive for headaches. Negative for light-headedness.       Objective:   Vitals:   08/04/16 0758  BP: 124/86  Pulse: 74  Resp: 16  Temp: 98.2 F (36.8 C)   Wt Readings from Last 3 Encounters:  08/04/16 196 lb (88.9 kg)  06/02/16 193 lb (87.5 kg)  04/20/16 195 lb (88.5 kg)   Body mass index is 35.85 kg/m.   Physical Exam    Constitutional: Appears well-developed and well-nourished. No distress.  HENT:  Head: Normocephalic and atraumatic.  Neck: Neck supple. No tracheal deviation present. No thyromegaly present.  No cervical lymphadenopathy Cardiovascular: Normal rate, regular rhythm and normal heart sounds.   2/6 systolic murmur heard. No carotid bruit .  No edema Pulmonary/Chest: Effort normal and breath sounds normal. No respiratory distress. No has no wheezes. No rales.  Skin: Skin is warm and dry. Not diaphoretic.  Psychiatric: Normal mood and affect. Behavior is normal.      Assessment & Plan:    See Problem List for Assessment and Plan of chronic medical problems.

## 2016-08-04 NOTE — Assessment & Plan Note (Addendum)
BP controlled Current regimen effective and well tolerated Continue current medications at current doses Labs today

## 2016-08-04 NOTE — Assessment & Plan Note (Signed)
Does not want to take a statin Taking red yeast rice Check lipids

## 2016-08-04 NOTE — Assessment & Plan Note (Signed)
Controlled, stable Continue current dose of medication  

## 2016-08-04 NOTE — Patient Instructions (Addendum)
  Test(s) ordered today. Your results will be released to [place] (or called to you) after review, usually within 72hours after test completion. If any changes need to be made, you will be notified at that same time.   Medications reviewed and updated.   No changes recommended at this time.  A  Stress test was ordered  Please followup in 6 months with me; schedule a wellness with Sharee Pimple

## 2016-08-04 NOTE — Telephone Encounter (Signed)
Heather [name] refused to schedule her stress test,she needs clarification of witch test is needed.A stress echo or a chemical stress test.

## 2016-08-04 NOTE — Progress Notes (Signed)
Pre visit review using our clinic review tool, if applicable. No additional management support is needed unless otherwise documented below in the visit note. 

## 2016-08-04 NOTE — Assessment & Plan Note (Signed)
Episode with severe anxiety/stress Concern for possible heart disease Stress test ordered

## 2016-08-05 ENCOUNTER — Encounter: Payer: Medicare Other | Attending: Endocrinology | Admitting: Nutrition

## 2016-08-05 ENCOUNTER — Ambulatory Visit (INDEPENDENT_AMBULATORY_CARE_PROVIDER_SITE_OTHER): Payer: Medicare Other | Admitting: Endocrinology

## 2016-08-05 ENCOUNTER — Encounter: Payer: Self-pay | Admitting: Endocrinology

## 2016-08-05 VITALS — BP 136/88 | HR 78 | Ht 62.0 in | Wt 197.0 lb

## 2016-08-05 DIAGNOSIS — Z713 Dietary counseling and surveillance: Secondary | ICD-10-CM | POA: Insufficient documentation

## 2016-08-05 DIAGNOSIS — E1142 Type 2 diabetes mellitus with diabetic polyneuropathy: Secondary | ICD-10-CM | POA: Diagnosis not present

## 2016-08-05 DIAGNOSIS — E119 Type 2 diabetes mellitus without complications: Secondary | ICD-10-CM

## 2016-08-05 MED ORDER — DULAGLUTIDE 1.5 MG/0.5ML ~~LOC~~ SOAJ: 1.5000 mg | SUBCUTANEOUS | 11 refills | Status: DC

## 2016-08-05 NOTE — Progress Notes (Signed)
We discussed how this medication works, in reference to her diabetes, side effects and how/when to inject the medication.  She reported good understanding of this and had no final questions.   Pt. Is very upset with her weight gain, despite counting carbs and calories.  Suggested she see a dietitian if this does not help her.

## 2016-08-05 NOTE — Patient Instructions (Signed)
Take Trulicity once a week as directed.

## 2016-08-05 NOTE — Patient Instructions (Addendum)
check your blood sugar once a day.  vary the time of day when you check, between before the 3 meals, and at bedtime.  also check if you have symptoms of your blood sugar being too high or too low.  please keep a record of the readings and bring it to your next appointment here (or you can bring the meter itself).  You can write it on any piece of paper.  please call us sooner if your blood sugar goes below 70, or if you have a lot of readings over 200.  I have sent a prescription to your pharmacy, to change januvia to trulicity.   Also, I would be happy to refer you to a dietician.  Just let me know.  Please continue the same other diabetes medications.  Please come back for a follow-up appointment in 4 months.

## 2016-08-05 NOTE — Progress Notes (Signed)
Subjective:    Patient ID: Heather [name], female    DOB: [birth year], 66 y.o.   MRN: [id number]  HPI Pt returns for f/u of diabetes mellitus: DM type: 2 Dx'ed: 2500 Complications: polyneuropathy Therapy: 3 oral meds GDM: never DKA: never Severe hypoglycemia: never Pancreatitis: never Other: she has never been on insulin; short bowel syndrome limits metformin dosage; she did not tolerate farxiga (vaginitis)   Interval history: she brings a record of her cbg's which I have reviewed today.  She checks mostly fasting.  It varies from 89-163.   Past Medical History:  Diagnosis Date  . Anemia   . Colon polyps   . Diabetes mellitus without complication ([place])   . Environmental allergies   . GERD (gastroesophageal reflux disease)   . Heart murmur   . History of DVT (deep vein thrombosis) 1970'S  . History of hiatal hernia   . History of transfusion   . Hyperlipidemia   . Hypertension   . Interstitial cystitis    Dr Janice Norrie  . Shortness of breath dyspnea    "due to low hemaglobin"    Past Surgical History:  Procedure Laterality Date  . APPENDECTOMY    . BILATERAL SALPINGOOPHORECTOMY     painful cysts  . COLONOSCOPY  2005   negative; Dr Earlean Shawl. F/U declined  . CYSTOSCOPY     X 4; Dr Janice Norrie  . LAPAROSCOPIC RIGHT COLECTOMY Right 08/19/2015   Procedure: LAPAROSCOPIC ASSISTED  RIGHT COLECTOMY;  Surgeon: Alphonsa Overall, MD;  Location: [facility];  Service: General;  Laterality: Right;  . PREMAGLINANT POLYPS    . TOTAL ABDOMINAL HYSTERECTOMY     metromenorrhagia    Social History   Social History  . Marital status: Widowed    Spouse name: N/A  . Number of children: N/A  . Years of education: N/A   Occupational History  . Not on file.   Social History Main Topics  . Smoking status: Former Smoker    Quit date: 05/18/1984  . Smokeless tobacco: Never Used     Comment: smoked 1972-1986, up to 1 ppd  . Alcohol use No  . Drug use: No  . Sexual activity: Not on file   Other Topics  Concern  . Not on file   Social History Narrative   Lives with brother, daughter and 2 grandchildren in a 3 story home.  Has 2 children.  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     Highest level of education:  2 years of graduate school    Current Outpatient Prescriptions on File Prior to Visit  Medication Sig Dispense Refill  . Alum & Mag Hydroxide-Simeth (ANTACID ADVANCED PO) Take 1 tablet by mouth daily as needed (heartburn).     . Blood Glucose Monitoring Suppl (CVS BLOOD GLUCOSE METER) W/DEVICE KIT Use to test blood sugar twice daily ICD 10 E11 9 1 kit 0  . CVS LANCETS THIN MISC Use to test blood sugar twice daily ICD 19 E11 9 100 each 3  . gabapentin (NEURONTIN) 300 MG capsule Take 2 capsules (600 mg total) by mouth at bedtime. 180 capsule 3  . glucose blood (CVS ADVANCED GLUCOSE TEST) test strip TEST BLOOD SUGAR TWICE DAILY-- DX E11.42 100 each 1  . hydrochlorothiazide (MICROZIDE) 12.5 MG capsule Take 1 capsule (12.5 mg total) by mouth daily. 90 capsule 1  . losartan (COZAAR) 100 MG tablet TAKE 1 TABLET EVERY DAY 90 tablet 1  .  metFORMIN (GLUCOPHAGE-XR) 500 MG 24 hr tablet Take 1 tablet (500 mg total) by mouth daily. 90 tablet 3  . Meth-Hyo-M Bl-Na Phos-Ph Sal (URIBEL) 118 MG CAPS Take 1 capsule by mouth 2 (two) times daily as needed.  3  . Multiple Vitamin (MULTIVITAMIN WITH MINERALS) TABS tablet Take by mouth every morning.    . multivitamin-lutein (OCUVITE-LUTEIN) CAPS capsule Take 1 capsule by mouth daily.    . nateglinide (STARLIX) 120 MG tablet Take 1 tablet (120 mg total) by mouth 3 (three) times daily with meals. 90 tablet 11  . ofloxacin (FLOXIN OTIC) 0.3 % otic solution Place 5 drops into the left ear daily. (Patient taking differently: Place 5 drops into the left ear daily as needed. ) 5 mL 0  . ranitidine (ZANTAC) 150 MG tablet Take 150 mg by mouth daily.     . Red Yeast Rice 600 MG TABS Take 1,200 mg by mouth daily.    .  Simethicone (GAS-X PO) Take 1 tablet by mouth daily as needed (gas.).     Marland Kitchen tiZANidine (ZANAFLEX) 2 MG tablet Take 1 tablet (2 mg total) by mouth at bedtime as needed for muscle spasms. 90 tablet 3  . vitamin B-12 (CYANOCOBALAMIN) 250 MCG tablet Take 5,000 mcg by mouth daily.      No current facility-administered medications on file prior to visit.     Allergies  Allergen Reactions  . Flagyl [Metronidazole] Other (See Comments)    Made really sick. Was able to tolerate dose 4/17 with zofran  . Lisinopril     04/03/15 cough reported X several months  . Sulfonamide Derivatives     nausea    Family History  Problem Relation Age of Onset  . Heart attack Mother     in 47s  . Hypertension Mother   . Stroke Mother      in 21s  . Stroke Maternal Grandfather 50  . Diabetes Neg Hx   . Heart attack Paternal Grandfather     in 80s  . Breast cancer Sister   . Other Father     Deceased, 76  . Healthy Brother   . Healthy Son   . Healthy Daughter     BP 136/88   Pulse 78   Ht '5\' 2"'  (1.575 m)   Wt 197 lb (89.4 kg)   SpO2 94%   BMI 36.03 kg/m   Review of Systems She has weight gain.      Objective:   Physical Exam VITAL SIGNS:  See vs page GENERAL: no distress Pulses: dorsalis pedis intact bilat.   MSK: no deformity of the feet CV: no leg edema Skin:  no ulcer on the feet, but there are heavy calluses.  normal color and temp on the feet. Neuro: sensation is intact to touch on the feet, but decreased from normal.  Ext: There is bilateral onychomycosis of the toenails.    Lab Results  Component Value Date   HGBA1C 7.6 (H) 08/04/2016      Assessment & Plan:  Type 2 DM, with polyneuropathy: worse Obesity: persistent Patient is advised the following: Patient Instructions  check your blood sugar once a day.  vary the time of day when you check, between before the 3 meals, and at bedtime.  also check if you have symptoms of your blood sugar being too high or too low.   please keep a record of the readings and bring it to your next appointment here (or you can bring the meter  itself).  You can write it on any piece of paper.  please call us sooner if your blood sugar goes below 70, or if you have a lot of readings over 200.  I have sent a prescription to your pharmacy, to change januvia to trulicity.   Also, I would be happy to refer you to a dietician.  Just let me know.  Please continue the same other diabetes medications.  Please come back for a follow-up appointment in 4 months.

## 2016-08-05 NOTE — Telephone Encounter (Signed)
Can you check on this - I ordered a dobutamine stress test - see order.  I am assuming I need to order it differently  - the order says dobutamine.

## 2016-08-06 ENCOUNTER — Encounter: Payer: Self-pay | Admitting: Internal Medicine

## 2016-08-06 NOTE — Telephone Encounter (Signed)
Heather [name], [facility] states appt has already been made.

## 2016-08-08 ENCOUNTER — Encounter: Payer: Self-pay | Admitting: Internal Medicine

## 2016-08-09 ENCOUNTER — Encounter: Payer: Self-pay | Admitting: Internal Medicine

## 2016-08-09 DIAGNOSIS — E785 Hyperlipidemia, unspecified: Secondary | ICD-10-CM

## 2016-08-10 MED ORDER — ATORVASTATIN CALCIUM 10 MG PO TABS: 10.0000 mg | ORAL_TABLET | Freq: Every day | ORAL | 3 refills | Status: DC

## 2016-08-18 ENCOUNTER — Telehealth: Payer: Self-pay | Admitting: Internal Medicine

## 2016-08-18 DIAGNOSIS — R079 Chest pain, unspecified: Secondary | ICD-10-CM

## 2016-08-18 DIAGNOSIS — R0602 Shortness of breath: Secondary | ICD-10-CM

## 2016-08-18 NOTE — Telephone Encounter (Signed)
pt has orders for stress echo but before the stress echo they would like her to get a regular echo as well/ Requesting to have this done before 4/19. Would like order put in for this.

## 2016-08-18 NOTE — Telephone Encounter (Signed)
ordered

## 2016-08-18 NOTE — Telephone Encounter (Signed)
Please advise 

## 2016-08-24 ENCOUNTER — Other Ambulatory Visit (HOSPITAL_COMMUNITY): Payer: Medicare Other

## 2016-08-25 DIAGNOSIS — H10412 Chronic giant papillary conjunctivitis, left eye: Secondary | ICD-10-CM | POA: Diagnosis not present

## 2016-08-27 ENCOUNTER — Telehealth (HOSPITAL_COMMUNITY): Payer: Self-pay | Admitting: *Deleted

## 2016-08-27 NOTE — Telephone Encounter (Signed)
Patient given detailed instructions per Stress Test Requisition Sheet for test on 09/03/16 at 2:30.Patient Notified to arrive 30 minutes early, and that it is imperative to arrive on time for appointment to keep from having the test rescheduled.  Patient verbalized understanding. Heather [name]

## 2016-09-02 ENCOUNTER — Other Ambulatory Visit: Payer: Self-pay

## 2016-09-02 ENCOUNTER — Ambulatory Visit (HOSPITAL_COMMUNITY): Payer: Medicare Other | Attending: Cardiovascular Disease

## 2016-09-02 DIAGNOSIS — R0602 Shortness of breath: Secondary | ICD-10-CM | POA: Diagnosis not present

## 2016-09-02 DIAGNOSIS — I420 Dilated cardiomyopathy: Secondary | ICD-10-CM | POA: Insufficient documentation

## 2016-09-02 DIAGNOSIS — I361 Nonrheumatic tricuspid (valve) insufficiency: Secondary | ICD-10-CM | POA: Insufficient documentation

## 2016-09-02 DIAGNOSIS — I5030 Unspecified diastolic (congestive) heart failure: Secondary | ICD-10-CM | POA: Insufficient documentation

## 2016-09-02 DIAGNOSIS — R079 Chest pain, unspecified: Secondary | ICD-10-CM | POA: Diagnosis not present

## 2016-09-03 ENCOUNTER — Ambulatory Visit (HOSPITAL_COMMUNITY): Payer: Medicare Other

## 2016-09-03 ENCOUNTER — Ambulatory Visit (HOSPITAL_COMMUNITY): Payer: Medicare Other | Attending: Cardiology

## 2016-09-03 DIAGNOSIS — R0789 Other chest pain: Secondary | ICD-10-CM | POA: Diagnosis not present

## 2016-09-03 DIAGNOSIS — E119 Type 2 diabetes mellitus without complications: Secondary | ICD-10-CM | POA: Diagnosis not present

## 2016-09-03 DIAGNOSIS — I10 Essential (primary) hypertension: Secondary | ICD-10-CM | POA: Insufficient documentation

## 2016-09-03 DIAGNOSIS — R0609 Other forms of dyspnea: Secondary | ICD-10-CM | POA: Insufficient documentation

## 2016-09-03 DIAGNOSIS — E785 Hyperlipidemia, unspecified: Secondary | ICD-10-CM | POA: Diagnosis not present

## 2016-09-03 DIAGNOSIS — R079 Chest pain, unspecified: Secondary | ICD-10-CM | POA: Diagnosis not present

## 2016-09-03 DIAGNOSIS — Z87891 Personal history of nicotine dependence: Secondary | ICD-10-CM | POA: Insufficient documentation

## 2016-09-03 DIAGNOSIS — Z8249 Family history of ischemic heart disease and other diseases of the circulatory system: Secondary | ICD-10-CM | POA: Diagnosis not present

## 2016-09-03 MED ORDER — SODIUM CHLORIDE 0.9 % IV SOLN
20.0000 ug/kg/min | Freq: Once | INTRAVENOUS | Status: AC
  Administered 2016-09-03: 20 ug/kg/min via INTRAVENOUS

## 2016-09-07 DIAGNOSIS — H10413 Chronic giant papillary conjunctivitis, bilateral: Secondary | ICD-10-CM | POA: Diagnosis not present

## 2016-09-22 ENCOUNTER — Other Ambulatory Visit: Payer: Self-pay | Admitting: Internal Medicine

## 2016-09-30 ENCOUNTER — Ambulatory Visit (INDEPENDENT_AMBULATORY_CARE_PROVIDER_SITE_OTHER): Payer: Medicare Other | Admitting: Endocrinology

## 2016-09-30 ENCOUNTER — Encounter: Payer: Self-pay | Admitting: Endocrinology

## 2016-09-30 VITALS — BP 120/66 | HR 68 | Ht 60.0 in | Wt 194.0 lb

## 2016-09-30 DIAGNOSIS — E1142 Type 2 diabetes mellitus with diabetic polyneuropathy: Secondary | ICD-10-CM | POA: Diagnosis not present

## 2016-09-30 LAB — POCT GLYCOSYLATED HEMOGLOBIN (HGB A1C): Hemoglobin A1C: 6.6

## 2016-09-30 NOTE — Patient Instructions (Addendum)

## 2016-09-30 NOTE — Progress Notes (Signed)
Subjective:    Patient ID: Heather [name], female    DOB: Nov 23, 1950, 66 y.o.   MRN: [id number]  HPI Pt returns for f/u of diabetes mellitus: DM type: 2 Dx'ed: 1950 Complications: polyneuropathy Therapy: trulicity and 2 oral meds GDM: never DKA: never Severe hypoglycemia: never Pancreatitis: never Other: she has never been on insulin; short bowel syndrome limits metformin dosage; she did not tolerate farxiga (vaginitis).   Interval history: she brings a record of her cbg's which I have reviewed today.  She checks mostly fasting.  It varies from 90-146.  It is in general higher as the day goes on.  pt states she feels well in general, except for mild nausea.   Past Medical History:  Diagnosis Date  . Anemia   . Colon polyps   . Diabetes mellitus without complication ([place])   . Environmental allergies   . GERD (gastroesophageal reflux disease)   . Heart murmur   . History of DVT (deep vein thrombosis) 1970'S  . History of hiatal hernia   . History of transfusion   . Hyperlipidemia   . Hypertension   . Interstitial cystitis    Dr Janice Norrie  . Shortness of breath dyspnea    "due to low hemaglobin"    Past Surgical History:  Procedure Laterality Date  . APPENDECTOMY    . BILATERAL SALPINGOOPHORECTOMY     painful cysts  . COLONOSCOPY  2005   negative; Dr Earlean Shawl. F/U declined  . CYSTOSCOPY     X 4; Dr Janice Norrie  . LAPAROSCOPIC RIGHT COLECTOMY Right 08/19/2015   Procedure: LAPAROSCOPIC ASSISTED  RIGHT COLECTOMY;  Surgeon: Alphonsa Overall, MD;  Location: [facility];  Service: General;  Laterality: Right;  . PREMAGLINANT POLYPS    . TOTAL ABDOMINAL HYSTERECTOMY     metromenorrhagia    Social History   Social History  . Marital status: Widowed    Spouse name: N/A  . Number of children: N/A  . Years of education: N/A   Occupational History  . Not on file.   Social History Main Topics  . Smoking status: Former Smoker    Quit date: 05/18/1984  . Smokeless tobacco: Never Used   Comment: smoked 1972-1986, up to 1 ppd  . Alcohol use No  . Drug use: No  . Sexual activity: Not on file   Other Topics Concern  . Not on file   Social History Narrative   Lives with brother, daughter and 2 grandchildren in a 3 story home.  Has 2 children.  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     Highest level of education:  2 years of graduate school    Current Outpatient Prescriptions on File Prior to Visit  Medication Sig Dispense Refill  . Alum & Mag Hydroxide-Simeth (ANTACID ADVANCED PO) Take 1 tablet by mouth daily as needed (heartburn).     Marland Kitchen atorvastatin (LIPITOR) 10 MG tablet Take 1 tablet (10 mg total) by mouth daily. 30 tablet 3  . Blood Glucose Monitoring Suppl (CVS BLOOD GLUCOSE METER) W/DEVICE KIT Use to test blood sugar twice daily ICD 10 E11 9 1 kit 0  . CVS LANCETS THIN MISC Use to test blood sugar twice daily ICD 19 E11 9 100 each 3  . Dulaglutide (TRULICITY) 1.5 DT/2.6ZT SOPN Inject 1.5 mg into the skin once a week. 4 pen 11  . gabapentin (NEURONTIN) 300 MG capsule Take 2 capsules (600 mg total) by  mouth at bedtime. 180 capsule 3  . glucose blood (CVS ADVANCED GLUCOSE TEST) test strip TEST BLOOD SUGAR TWICE DAILY-- DX E11.42 100 each 1  . hydrochlorothiazide (MICROZIDE) 12.5 MG capsule Take 1 capsule (12.5 mg total) by mouth daily. 90 capsule 1  . losartan (COZAAR) 100 MG tablet TAKE 1 TABLET EVERY DAY 90 tablet 1  . metFORMIN (GLUCOPHAGE-XR) 500 MG 24 hr tablet Take 1 tablet (500 mg total) by mouth daily. 90 tablet 3  . Multiple Vitamin (MULTIVITAMIN WITH MINERALS) TABS tablet Take by mouth every morning.    . multivitamin-lutein (OCUVITE-LUTEIN) CAPS capsule Take 1 capsule by mouth daily.    . nateglinide (STARLIX) 120 MG tablet Take 1 tablet (120 mg total) by mouth 3 (three) times daily with meals. 90 tablet 11  . ranitidine (ZANTAC) 150 MG tablet Take 150 mg by mouth daily.     . Simethicone (GAS-X PO) Take 1  tablet by mouth daily as needed (gas.).     Marland Kitchen tiZANidine (ZANAFLEX) 2 MG tablet Take 1 tablet (2 mg total) by mouth at bedtime as needed for muscle spasms. 90 tablet 3  . vitamin B-12 (CYANOCOBALAMIN) 250 MCG tablet Take 5,000 mcg by mouth daily.     . Meth-Hyo-M Bl-Na Phos-Ph Sal (URIBEL) 118 MG CAPS Take 1 capsule by mouth 2 (two) times daily as needed.  3  . ofloxacin (FLOXIN OTIC) 0.3 % otic solution Place 5 drops into the left ear daily. (Patient not taking: Reported on 09/30/2016) 5 mL 0   No current facility-administered medications on file prior to visit.     Allergies  Allergen Reactions  . Flagyl [Metronidazole] Other (See Comments)    Made really sick. Was able to tolerate dose 4/17 with zofran  . Lisinopril     04/03/15 cough reported X several months  . Sulfonamide Derivatives     nausea    Family History  Problem Relation Age of Onset  . Heart attack Mother        in 23s  . Hypertension Mother   . Stroke Mother         in 27s  . Stroke Maternal Grandfather 50  . Diabetes Neg Hx   . Heart attack Paternal Grandfather        in 57s  . Breast cancer Sister   . Other Father        Deceased, 49  . Healthy Brother   . Healthy Son   . Healthy Daughter     BP 120/66   Pulse 68   Ht 5' (1.524 m)   Wt 194 lb (88 kg)   SpO2 95%   BMI 37.89 kg/m    Review of Systems She denies hypoglycemia.      Objective:   Physical Exam VITAL SIGNS:  See vs page GENERAL: no distress Pulses: dorsalis pedis intact bilat.   MSK: no deformity of the feet.   CV: no leg edema.  Skin:  no ulcer on the feet, but there are heavy calluses.  normal color and temp on the feet. Neuro: sensation is intact to touch on the feet, but decreased from normal.  Ext: There is bilateral onychomycosis of the toenails.    A1c=6.6%    Assessment & Plan:  Type 2 DM: well-controlled.  Nausea: this limits rx options.   Patient Instructions  check your blood sugar once a day.  vary the time  of day when you check, between before the 3 meals, and at bedtime.  also check if you have symptoms of your blood sugar being too high or too low.  please keep a record of the readings and bring it to your next appointment here (or you can bring the meter itself).  You can write it on any piece of paper.  please call us sooner if your blood sugar goes below 70, or if you have a lot of readings over 200.  Please continue the same medications.  Please come back for a follow-up appointment in 4 months.

## 2016-10-08 ENCOUNTER — Other Ambulatory Visit: Payer: Self-pay | Admitting: Internal Medicine

## 2016-10-21 DIAGNOSIS — Z1231 Encounter for screening mammogram for malignant neoplasm of breast: Secondary | ICD-10-CM | POA: Diagnosis not present

## 2016-10-21 DIAGNOSIS — Z803 Family history of malignant neoplasm of breast: Secondary | ICD-10-CM | POA: Diagnosis not present

## 2016-10-21 LAB — HM MAMMOGRAPHY

## 2016-11-13 ENCOUNTER — Encounter: Payer: Self-pay | Admitting: Internal Medicine

## 2016-11-13 NOTE — Progress Notes (Unsigned)
Results entered and sent to scan  

## 2016-11-30 ENCOUNTER — Ambulatory Visit: Payer: Medicare Other | Admitting: Endocrinology

## 2016-12-03 ENCOUNTER — Other Ambulatory Visit: Payer: Self-pay | Admitting: Internal Medicine

## 2016-12-05 ENCOUNTER — Other Ambulatory Visit: Payer: Self-pay | Admitting: Neurology

## 2016-12-07 ENCOUNTER — Other Ambulatory Visit: Payer: Self-pay | Admitting: *Deleted

## 2016-12-07 MED ORDER — GABAPENTIN 300 MG PO CAPS: 600.0000 mg | ORAL_CAPSULE | Freq: Every day | ORAL | 0 refills | Status: DC

## 2016-12-07 NOTE — Telephone Encounter (Signed)
Rx sent for one month supply.  Patient needs a follow up or she can follow up with PCP.

## 2016-12-14 DIAGNOSIS — J019 Acute sinusitis, unspecified: Secondary | ICD-10-CM | POA: Diagnosis not present

## 2017-01-03 ENCOUNTER — Other Ambulatory Visit: Payer: Self-pay | Admitting: Neurology

## 2017-01-04 ENCOUNTER — Other Ambulatory Visit: Payer: Self-pay | Admitting: Internal Medicine

## 2017-01-04 ENCOUNTER — Other Ambulatory Visit: Payer: Self-pay | Admitting: *Deleted

## 2017-01-07 DIAGNOSIS — Z23 Encounter for immunization: Secondary | ICD-10-CM | POA: Diagnosis not present

## 2017-01-20 ENCOUNTER — Ambulatory Visit (INDEPENDENT_AMBULATORY_CARE_PROVIDER_SITE_OTHER): Payer: Medicare Other | Admitting: Endocrinology

## 2017-01-20 ENCOUNTER — Encounter: Payer: Self-pay | Admitting: Endocrinology

## 2017-01-20 VITALS — BP 144/92 | HR 84 | Wt 188.6 lb

## 2017-01-20 DIAGNOSIS — E1142 Type 2 diabetes mellitus with diabetic polyneuropathy: Secondary | ICD-10-CM

## 2017-01-20 LAB — POCT GLYCOSYLATED HEMOGLOBIN (HGB A1C): HEMOGLOBIN A1C: 6.5

## 2017-01-20 NOTE — Patient Instructions (Addendum)
check your blood sugar once a day.  vary the time of day when you check, between before the 3 meals, and at bedtime.  also check if you have symptoms of your blood sugar being too high or too low.  please keep a record of the readings and bring it to your next appointment here (or you can bring the meter itself).  You can write it on any piece of paper.  please call us sooner if your blood sugar goes below 70, or if you have a lot of readings over 200.  Please continue the same medications.  However, if you are going to be active, skip the nateglinide.  If you are unable to anticipate exercise, eat a light snack with it.   Please come back for a follow-up appointment in 6 months.

## 2017-01-20 NOTE — Progress Notes (Signed)
Subjective:    Patient ID: Heather [name], female    DOB: Feb 16, 1951, 66 y.o.   MRN: [id number]  HPI Pt returns for f/u of diabetes mellitus: DM type: 2 Dx'ed: 7902 Complications: polyneuropathy Therapy: trulicity and 2 oral meds GDM: never DKA: never Severe hypoglycemia: never Pancreatitis: never Other: she has never been on insulin; short bowel syndrome limits metformin dosage; she did not tolerate farxiga (vaginitis).   Interval history: she brings a record of her cbg's which I have reviewed today.  She checks mostly fasting.  It varies from 43-154.  It is in general lowest with exertion.  pt states she feels well in general, except when cbg is low. Past Medical History:  Diagnosis Date  . Anemia   . Colon polyps   . Diabetes mellitus without complication ([place])   . Environmental allergies   . GERD (gastroesophageal reflux disease)   . Heart murmur   . History of DVT (deep vein thrombosis) 1970'S  . History of hiatal hernia   . History of transfusion   . Hyperlipidemia   . Hypertension   . Interstitial cystitis    Dr Janice Norrie  . Shortness of breath dyspnea    "due to low hemaglobin"    Past Surgical History:  Procedure Laterality Date  . APPENDECTOMY    . BILATERAL SALPINGOOPHORECTOMY     painful cysts  . COLONOSCOPY  2005   negative; Dr Earlean Shawl. F/U declined  . CYSTOSCOPY     X 4; Dr Janice Norrie  . LAPAROSCOPIC RIGHT COLECTOMY Right 08/19/2015   Procedure: LAPAROSCOPIC ASSISTED  RIGHT COLECTOMY;  Surgeon: Alphonsa Overall, MD;  Location: [facility];  Service: General;  Laterality: Right;  . PREMAGLINANT POLYPS    . TOTAL ABDOMINAL HYSTERECTOMY     metromenorrhagia    Social History   Social History  . Marital status: Widowed    Spouse name: N/A  . Number of children: N/A  . Years of education: N/A   Occupational History  . Not on file.   Social History Main Topics  . Smoking status: Former Smoker    Quit date: 05/18/1984  . Smokeless tobacco: Never Used     Comment:  smoked 1972-1986, up to 1 ppd  . Alcohol use No  . Drug use: No  . Sexual activity: Not on file   Other Topics Concern  . Not on file   Social History Narrative   Lives with brother, daughter and 2 grandchildren in a 3 story home.  Has 2 children.  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     Highest level of education:  2 years of graduate school    Current Outpatient Prescriptions on File Prior to Visit  Medication Sig Dispense Refill  . Alum & Mag Hydroxide-Simeth (ANTACID ADVANCED PO) Take 1 tablet by mouth daily as needed (heartburn).     Marland Kitchen atorvastatin (LIPITOR) 10 MG tablet TAKE 1 TABLET (10 MG TOTAL) BY MOUTH DAILY. 30 tablet 2  . Blood Glucose Monitoring Suppl ([facility] BLOOD GLUCOSE METER) W/DEVICE KIT Use to test blood sugar twice daily ICD 10 E11 9 1 kit 0  . [facility] ADVANCED GLUCOSE TEST test strip TEST BLOOD SUGAR TWICE A DAY (E11.42) 100 each 3  . [facility] LANCETS THIN MISC Use to test blood sugar twice daily ICD 19 E11 9 100 each 3  . Dulaglutide (TRULICITY) 1.5 IO/9.7DZ SOPN Inject 1.5 mg into the skin once a week.  4 pen 11  . gabapentin (NEURONTIN) 300 MG capsule TAKE 2 CAPSULES (600 MG TOTAL) BY MOUTH AT BEDTIME. 60 capsule 0  . hydrochlorothiazide (MICROZIDE) 12.5 MG capsule Take 1 capsule (12.5 mg total) by mouth daily. 90 capsule 1  . losartan (COZAAR) 100 MG tablet TAKE 1 TABLET EVERY DAY 90 tablet 1  . metFORMIN (GLUCOPHAGE-XR) 500 MG 24 hr tablet Take 1 tablet (500 mg total) by mouth daily. 90 tablet 3  . Meth-Hyo-M Bl-Na Phos-Ph Sal (URIBEL) 118 MG CAPS Take 1 capsule by mouth 2 (two) times daily as needed.  3  . Multiple Vitamin (MULTIVITAMIN WITH MINERALS) TABS tablet Take by mouth every morning.    . multivitamin-lutein (OCUVITE-LUTEIN) CAPS capsule Take 1 capsule by mouth daily.    . nateglinide (STARLIX) 120 MG tablet Take 1 tablet (120 mg total) by mouth 3 (three) times daily with meals. 90 tablet 11  . ofloxacin (FLOXIN  OTIC) 0.3 % otic solution Place 5 drops into the left ear daily. (Patient not taking: Reported on 09/30/2016) 5 mL 0  . ranitidine (ZANTAC) 150 MG tablet Take 150 mg by mouth daily.     . Simethicone (GAS-X PO) Take 1 tablet by mouth daily as needed (gas.).     Marland Kitchen tiZANidine (ZANAFLEX) 2 MG tablet Take 1 tablet (2 mg total) by mouth at bedtime as needed for muscle spasms. 90 tablet 3  . vitamin B-12 (CYANOCOBALAMIN) 250 MCG tablet Take 5,000 mcg by mouth daily.      No current facility-administered medications on file prior to visit.     Allergies  Allergen Reactions  . Flagyl [Metronidazole] Other (See Comments)    Made really sick. Was able to tolerate dose 4/17 with zofran  . Lisinopril     04/03/15 cough reported X several months  . Sulfonamide Derivatives     nausea    Family History  Problem Relation Age of Onset  . Heart attack Mother        in 43s  . Hypertension Mother   . Stroke Mother         in 33s  . Stroke Maternal Grandfather 50  . Diabetes Neg Hx   . Heart attack Paternal Grandfather        in 14s  . Breast cancer Sister   . Other Father        Deceased, 53  . Healthy Brother   . Healthy Son   . Healthy Daughter     BP (!) 144/92   Pulse 84   Wt 188 lb 9.6 oz (85.5 kg)   SpO2 98%   BMI 36.83 kg/m    Review of Systems Denies LOC.     Objective:   Physical Exam VITAL SIGNS:  See vs page.  GENERAL: no distress.  Pulses: foot pulses are intact bilaterally.   MSK: no deformity of the feet or ankles.  CV: no edema of the legs or ankles Skin:  no ulcer on the feet or ankles, but there are several heavy calluses.  normal color and temp on the feet and ankles Neuro: sensation is intact to touch on the feet and ankles, but decreased from normal.  Ext: There is bilateral onychomycosis of the toenails.     A1c=6.5%    Assessment & Plan:  Type 2 DM Short bowel syndrome: this limits rx options  Patient Instructions  check your blood sugar once a  day.  vary the time of day when you check, between before the  3 meals, and at bedtime.  also check if you have symptoms of your blood sugar being too high or too low.  please keep a record of the readings and bring it to your next appointment here (or you can bring the meter itself).  You can write it on any piece of paper.  please call us sooner if your blood sugar goes below 70, or if you have a lot of readings over 200.  Please continue the same medications.  However, if you are going to be active, skip the nateglinide.  If you are unable to anticipate exercise, eat a light snack.   Please come back for a follow-up appointment in 6 months.

## 2017-01-26 ENCOUNTER — Telehealth: Payer: Self-pay | Admitting: Emergency Medicine

## 2017-01-26 MED ORDER — FLUTICASONE PROPIONATE 50 MCG/ACT NA SUSP: 2.0000 | Freq: Every day | NASAL | 3 refills | Status: DC

## 2017-01-26 NOTE — Telephone Encounter (Signed)
sent 

## 2017-01-26 NOTE — Telephone Encounter (Signed)
Received refill request for 90 day supply of [facility], not on current med list. Pt last seen 08/04/16

## 2017-01-27 ENCOUNTER — Other Ambulatory Visit: Payer: Self-pay | Admitting: Internal Medicine

## 2017-01-27 DIAGNOSIS — I10 Essential (primary) hypertension: Secondary | ICD-10-CM

## 2017-02-01 NOTE — Patient Instructions (Addendum)
  Test(s) ordered today. Your results will be released to [place] (or called to you) after review, usually within 72hours after test completion. If any changes need to be made, you will be notified at that same time.  All other Health Maintenance issues reviewed.   All recommended immunizations and age-appropriate screenings are up-to-date or discussed.  No immunizations administered today.   Medications reviewed and updated.  Changes include adding as needed hydrochlorothiazide on top of the daily dose.   Your prescription(s) have been submitted to your pharmacy. Please take as directed and contact our office if you believe you are having problem(s) with the medication(s).   Please followup in 6 months

## 2017-02-01 NOTE — Progress Notes (Signed)
Subjective:    Patient ID: Heather [name], female    DOB: 1951/04/30, 66 y.o.   MRN: [id number]  HPI The patient is here for follow up.  Diabetes: She is seeing Dr Loanne Drilling. She is taking her medication daily as prescribed. She is compliant with a diabetic diet. She is not exercising regularly, but is active with her yard work and house work.  She checks her feet daily and denies foot lesions. She is up-to-date with an ophthalmology examination.   Diabetic neuropathy: She is taking the gabapentin at night and this is controlling her pain. This was originally prescribed by neurology, but she has not seen them routinely. She does need a refill today.  Muscle spasms at night:  She does get periodic muscle spasms at night. She has been taking tizanidine as needed and typically takes this a few times a week. This works well and she would like to continue.  Hypertension: She is taking her medication daily. She is compliant with a low sodium diet.  She does experience increased leg swelling in the summertime. Typically this only occurs in the summer. She denies chest pain, palpitations, shortness of breath and regular headaches. She is not exercising regularly, but is active with her yard work and house work.  She does monitor her blood pressure at home - 144/92 frequently, but it is good the other times..    Hyperlipidemia: She is taking her medication daily. She is compliant with a low fat/cholesterol diet. She is not exercising regularly, but is active with her yard work and house work.  She denies myalgias.    Medications and allergies reviewed with patient and updated if appropriate.  Patient Active Problem List   Diagnosis Date Noted  . Other chest pain 08/04/2016  . DOE (dyspnea on exertion) 08/04/2016  . Obese 08/04/2016  . Otitis media with rupture of tympanic membrane, left 04/20/2016  . Generalized osteoarthritis of hand 02/04/2016  . Diabetic polyneuropathy associated with  diabetes mellitus due to underlying condition ([place]) 11/27/2015  . Numbness and tingling of leg 10/09/2015  . Sessile colonic polyp 08/19/2015  . Hiatal hernia, large 08/02/2015  . Colon polyps 08/02/2015  . Anemia, unspecified 04/21/2015  . Diabetes type 2, controlled ([place]) 05/03/2014  . Hyperlipidemia, mixed 03/18/2014  . Epistaxis 09/06/2013  . Interstitial cystitis 07/08/2012  . Essential hypertension 02/10/2011    Current Outpatient Prescriptions on File Prior to Visit  Medication Sig Dispense Refill  . atorvastatin (LIPITOR) 10 MG tablet TAKE 1 TABLET (10 MG TOTAL) BY MOUTH DAILY. 30 tablet 2  . Blood Glucose Monitoring Suppl ([facility] BLOOD GLUCOSE METER) W/DEVICE KIT Use to test blood sugar twice daily ICD 10 E11 9 1 kit 0  . [facility] ADVANCED GLUCOSE TEST test strip TEST BLOOD SUGAR TWICE A DAY (E11.42) 100 each 3  . [facility] LANCETS THIN MISC Use to test blood sugar twice daily ICD 19 E11 9 100 each 3  . Dulaglutide (TRULICITY) 1.5 VQ/2.5ZD SOPN Inject 1.5 mg into the skin once a week. 4 pen 11  . gabapentin (NEURONTIN) 300 MG capsule TAKE 2 CAPSULES (600 MG TOTAL) BY MOUTH AT BEDTIME. 60 capsule 0  . hydrochlorothiazide (MICROZIDE) 12.5 MG capsule Take 1 capsule (12.5 mg total) by mouth daily. 90 capsule 1  . losartan (COZAAR) 100 MG tablet TAKE 1 TABLET EVERY DAY 90 tablet 0  . metFORMIN (GLUCOPHAGE-XR) 500 MG 24 hr tablet Take 1 tablet (500 mg total) by mouth daily. 90 tablet 3  .  Multiple Vitamin (MULTIVITAMIN WITH MINERALS) TABS tablet Take by mouth every morning.    . multivitamin-lutein (OCUVITE-LUTEIN) CAPS capsule Take 1 capsule by mouth daily.    . nateglinide (STARLIX) 120 MG tablet Take 1 tablet (120 mg total) by mouth 3 (three) times daily with meals. 90 tablet 11  . ranitidine (ZANTAC) 150 MG tablet Take 150 mg by mouth daily.     . Simethicone (GAS-X PO) Take 1 tablet by mouth daily as needed (gas.).     Marland Kitchen tiZANidine (ZANAFLEX) 2 MG tablet Take 1 tablet (2 mg total) by mouth at  bedtime as needed for muscle spasms. 90 tablet 3  . vitamin B-12 (CYANOCOBALAMIN) 250 MCG tablet Take 5,000 mcg by mouth daily.      No current facility-administered medications on file prior to visit.     Past Medical History:  Diagnosis Date  . Anemia   . Colon polyps   . Diabetes mellitus without complication ([place])   . Environmental allergies   . GERD (gastroesophageal reflux disease)   . Heart murmur   . History of DVT (deep vein thrombosis) 1970'S  . History of hiatal hernia   . History of transfusion   . Hyperlipidemia   . Hypertension   . Interstitial cystitis    Dr Janice Norrie  . Shortness of breath dyspnea    "due to low hemaglobin"    Past Surgical History:  Procedure Laterality Date  . APPENDECTOMY    . BILATERAL SALPINGOOPHORECTOMY     painful cysts  . COLONOSCOPY  2005   negative; Dr Earlean Shawl. F/U declined  . CYSTOSCOPY     X 4; Dr Janice Norrie  . LAPAROSCOPIC RIGHT COLECTOMY Right 08/19/2015   Procedure: LAPAROSCOPIC ASSISTED  RIGHT COLECTOMY;  Surgeon: Alphonsa Overall, MD;  Location: [facility];  Service: General;  Laterality: Right;  . PREMAGLINANT POLYPS    . TOTAL ABDOMINAL HYSTERECTOMY     metromenorrhagia    Social History   Social History  . Marital status: Widowed    Spouse name: N/A  . Number of children: N/A  . Years of education: N/A   Social History Main Topics  . Smoking status: Former Smoker    Quit date: 05/18/1984  . Smokeless tobacco: Never Used     Comment: smoked 1972-1986, up to 1 ppd  . Alcohol use No  . Drug use: No  . Sexual activity: Not Asked   Other Topics Concern  . None   Social History Narrative   Lives with brother, daughter and 2 grandchildren in a 3 story home.  Has 2 children.  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     Highest level of education:  2 years of graduate school    Family History  Problem Relation Age of Onset  . Heart attack Mother        in 70s  . Hypertension  Mother   . Stroke Mother         in 70s  . Stroke Maternal Grandfather 50  . Diabetes Neg Hx   . Heart attack Paternal Grandfather        in 15s  . Breast cancer Sister   . Other Father        Deceased, 51  . Healthy Brother   . Healthy Son   . Healthy Daughter     Review of Systems  Constitutional: Negative for chills and fever.  Respiratory: Positive for cough (allergies).  Negative for shortness of breath and wheezing.   Cardiovascular: Positive for leg swelling. Negative for chest pain and palpitations.  Neurological: Positive for headaches (more recently, not regularly). Negative for light-headedness.       Objective:   Vitals:   02/02/17 0838  BP: 122/82  Pulse: 79  Resp: 16  Temp: 98 F (36.7 C)  SpO2: 98%   Wt Readings from Last 3 Encounters:  02/02/17 188 lb (85.3 kg)  01/20/17 188 lb 9.6 oz (85.5 kg)  09/30/16 194 lb (88 kg)   Body mass index is 36.72 kg/m.   Physical Exam    Constitutional: Appears well-developed and well-nourished. No distress.  HENT:  Head: Normocephalic and atraumatic.  Neck: Neck supple. No tracheal deviation present. No thyromegaly present.  No cervical lymphadenopathy Cardiovascular: Normal rate, regular rhythm and normal heart sounds.   No murmur heard. No carotid bruit .  No edema Pulmonary/Chest: Effort normal and breath sounds normal. No respiratory distress. No has no wheezes. No rales.  Skin: Skin is warm and dry. Not diaphoretic.  Psychiatric: Normal mood and affect. Behavior is normal.      Assessment & Plan:    See Problem List for Assessment and Plan of chronic medical problems.

## 2017-02-02 ENCOUNTER — Other Ambulatory Visit (INDEPENDENT_AMBULATORY_CARE_PROVIDER_SITE_OTHER): Payer: Medicare Other

## 2017-02-02 ENCOUNTER — Ambulatory Visit (INDEPENDENT_AMBULATORY_CARE_PROVIDER_SITE_OTHER): Payer: Medicare Other | Admitting: Internal Medicine

## 2017-02-02 ENCOUNTER — Encounter: Payer: Self-pay | Admitting: Internal Medicine

## 2017-02-02 VITALS — BP 122/82 | HR 79 | Temp 98.0°F | Resp 16 | Ht 60.0 in | Wt 188.0 lb

## 2017-02-02 DIAGNOSIS — E0842 Diabetes mellitus due to underlying condition with diabetic polyneuropathy: Secondary | ICD-10-CM | POA: Diagnosis not present

## 2017-02-02 DIAGNOSIS — E782 Mixed hyperlipidemia: Secondary | ICD-10-CM

## 2017-02-02 DIAGNOSIS — E1142 Type 2 diabetes mellitus with diabetic polyneuropathy: Secondary | ICD-10-CM | POA: Diagnosis not present

## 2017-02-02 DIAGNOSIS — E785 Hyperlipidemia, unspecified: Secondary | ICD-10-CM

## 2017-02-02 DIAGNOSIS — G4762 Sleep related leg cramps: Secondary | ICD-10-CM

## 2017-02-02 DIAGNOSIS — I10 Essential (primary) hypertension: Secondary | ICD-10-CM | POA: Diagnosis not present

## 2017-02-02 LAB — COMPREHENSIVE METABOLIC PANEL
ALK PHOS: 53 U/L (ref 39–117)
ALT: 19 U/L (ref 0–35)
AST: 16 U/L (ref 0–37)
Albumin: 4.1 g/dL (ref 3.5–5.2)
BUN: 14 mg/dL (ref 6–23)
CHLORIDE: 100 meq/L (ref 96–112)
CO2: 27 mEq/L (ref 19–32)
Calcium: 10.1 mg/dL (ref 8.4–10.5)
Creatinine, Ser: 0.9 mg/dL (ref 0.40–1.20)
GFR: 66.11 mL/min (ref >60.00)
GLUCOSE: 86 mg/dL (ref 70–99)
POTASSIUM: 4.1 meq/L (ref 3.5–5.1)
SODIUM: 137 meq/L (ref 135–145)
TOTAL PROTEIN: 7.1 g/dL (ref 6.0–8.3)
Total Bilirubin: 0.8 mg/dL (ref 0.2–1.2)

## 2017-02-02 LAB — LIPID PANEL
Cholesterol: 258 mg/dL — ABNORMAL HIGH (ref 0–200)
HDL: 63.6 mg/dL (ref >39.00)
NONHDL: 194.22
Total CHOL/HDL Ratio: 4
Triglycerides: 321 mg/dL — ABNORMAL HIGH (ref 0.0–149.0)
VLDL: 64.2 mg/dL — ABNORMAL HIGH (ref 0.0–40.0)

## 2017-02-02 LAB — LDL CHOLESTEROL, DIRECT: Direct LDL: 142 mg/dL

## 2017-02-02 MED ORDER — TIZANIDINE HCL 2 MG PO TABS: 2.0000 mg | ORAL_TABLET | Freq: Every evening | ORAL | 3 refills | Status: DC | PRN

## 2017-02-02 MED ORDER — ATORVASTATIN CALCIUM 10 MG PO TABS: 10.0000 mg | ORAL_TABLET | Freq: Every day | ORAL | 1 refills | Status: DC

## 2017-02-02 MED ORDER — LOSARTAN POTASSIUM 100 MG PO TABS: 100.0000 mg | ORAL_TABLET | Freq: Every day | ORAL | 1 refills | Status: DC

## 2017-02-02 MED ORDER — HYDROCHLOROTHIAZIDE 12.5 MG PO CAPS: 12.5000 mg | ORAL_CAPSULE | Freq: Every day | ORAL | 1 refills | Status: DC

## 2017-02-02 MED ORDER — GABAPENTIN 300 MG PO CAPS: 600.0000 mg | ORAL_CAPSULE | Freq: Every day | ORAL | 1 refills | Status: DC

## 2017-02-02 NOTE — Assessment & Plan Note (Addendum)
BP has been higher than usual but blood pressure is still often in the normal range and probably on average normal She is experiencing increased leg edema, but this only occurs in summer We'll continue current medications on a daily basis, but we'll have her take an extra hydrochlorothiazide for increased leg edema or possibly increased blood pressure CMP today

## 2017-02-02 NOTE — Assessment & Plan Note (Signed)
Lab Results  Component Value Date   HGBA1C 6.5 01/20/2017   Well controlled Management per Dr Loanne Drilling Eye exam up to date

## 2017-02-02 NOTE — Assessment & Plan Note (Signed)
Take tizanidine as needed, typically a few times a week Will continue - refilled today

## 2017-02-02 NOTE — Assessment & Plan Note (Signed)
Gabapentin working well Continue 600 mg of gabapentin at night

## 2017-02-02 NOTE — Assessment & Plan Note (Signed)
Continue atorvastatin She did eat today so we will hold off on checking lipid panel Check CMP

## 2017-02-04 ENCOUNTER — Encounter: Payer: Self-pay | Admitting: Internal Medicine

## 2017-02-15 NOTE — Progress Notes (Signed)
Pre visit review using our clinic review tool, if applicable. No additional management support is needed unless otherwise documented below in the visit note. 

## 2017-02-15 NOTE — Progress Notes (Addendum)
Subjective:   Heather [name] is a 66 y.o. female who presents for Medicare Annual (Subsequent) preventive examination.  Review of Systems:  No ROS.  Medicare Wellness Visit. Additional risk factors are reflected in the social history.  Cardiac Risk Factors include: advanced age (>78mn, >>53women);diabetes mellitus;dyslipidemia;hypertension;obesity (BMI >30kg/m2) Sleep patterns: feels rested on waking, gets up 1-2 times nightly to void and sleeps 6-7 hours nightly.    Home Safety/Smoke Alarms: Feels safe in home. Smoke alarms in place.  Living environment; residence and Firearm Safety: 2-story house, no firearms. Lives with family, no needs for DME, good support system Seat Belt Safety/Bike Helmet: Wears seat belt.     Objective:     Vitals: BP 132/84   Pulse 75   Resp 20   Ht 5' (1.524 m)   Wt 188 lb (85.3 kg)   SpO2 99%   BMI 36.72 kg/m   Body mass index is 36.72 kg/m.   Tobacco History  Smoking Status  . Former Smoker  . Quit date: 05/18/1984  Smokeless Tobacco  . Never Used    Comment: smoked 1972-1986, up to 1 ppd     Counseling given: Not Answered   Past Medical History:  Diagnosis Date  . Anemia   . Colon polyps   . [place]   . Environmental allergies   . GERD (gastroesophageal reflux disease)   . Heart murmur   . History of DVT (deep vein thrombosis) 1970'S  . History of hiatal hernia   . History of transfusion   . Hyperlipidemia   . Hypertension   . Interstitial cystitis    Dr NJanice Norrie . Shortness of breath dyspnea    "due to low hemaglobin"   Past Surgical History:  Procedure Laterality Date  . APPENDECTOMY    . BILATERAL SALPINGOOPHORECTOMY     painful cysts  . COLONOSCOPY  2005   negative; Dr MEarlean Shawl F/U declined  . CYSTOSCOPY     X 4; Dr NJanice Norrie . LAPAROSCOPIC RIGHT COLECTOMY Right 08/19/2015   Procedure: LAPAROSCOPIC ASSISTED  RIGHT COLECTOMY;  Surgeon: DAlphonsa Overall MD;  Location: [facility];  Service:  General;  Laterality: Right;  . PREMAGLINANT POLYPS    . TOTAL ABDOMINAL HYSTERECTOMY     metromenorrhagia   Family History  Problem Relation Age of Onset  . Heart attack Mother        in 717s . Hypertension Mother   . Stroke Mother         in 730s . Other Father        Deceased, 859 . Stroke Maternal Grandfather 50  . Heart attack Paternal Grandfather        in 621s . Breast cancer Sister   . Healthy Brother   . Healthy Son   . Healthy Daughter   . Diabetes Neg Hx    History  Sexual Activity  . Sexual activity: Not on file    Outpatient Encounter Prescriptions as of 02/16/2017  Medication Sig  . atorvastatin (LIPITOR) 10 MG tablet Take 1 tablet (10 mg total) by mouth daily.  . Blood Glucose Monitoring Suppl (CVS BLOOD GLUCOSE METER) W/DEVICE KIT Use to test blood sugar twice daily ICD 10 E11 9  . CVS ADVANCED GLUCOSE TEST test strip TEST BLOOD SUGAR TWICE A DAY (E11.42)  . [facility]S LANCETS THIN MISC Use to test blood sugar twice daily ICD 19 E11 9  . Dulaglutide (TRULICITY) 1.5 MZY/2.4MGSOPN Inject  1.5 mg into the skin once a week.  . gabapentin (NEURONTIN) 300 MG capsule Take 2 capsules (600 mg total) by mouth at bedtime.  . hydrochlorothiazide (MICROZIDE) 12.5 MG capsule Take 1 capsule (12.5 mg total) by mouth daily. You can take an extra cap prn.  . losartan (COZAAR) 100 MG tablet Take 1 tablet (100 mg total) by mouth daily.  . metFORMIN (GLUCOPHAGE-XR) 500 MG 24 hr tablet Take 1 tablet (500 mg total) by mouth daily.  . Multiple Vitamin (MULTIVITAMIN WITH MINERALS) TABS tablet Take by mouth every morning.  . multivitamin-lutein (OCUVITE-LUTEIN) CAPS capsule Take 1 capsule by mouth daily.  . nateglinide (STARLIX) 120 MG tablet Take 1 tablet (120 mg total) by mouth 3 (three) times daily with meals.  . ranitidine (ZANTAC) 150 MG tablet Take 150 mg by mouth daily.   . Simethicone (GAS-X PO) Take 1 tablet by mouth daily as needed (gas.).   Marland Kitchen tiZANidine (ZANAFLEX) 2 MG tablet Take  1 tablet (2 mg total) by mouth at bedtime as needed for muscle spasms.  . vitamin B-12 (CYANOCOBALAMIN) 250 MCG tablet Take 5,000 mcg by mouth daily.    No facility-administered encounter medications on file as of 02/16/2017.     Activities of Daily Living In your present state of health, do you have any difficulty performing the following activities: 02/16/2017  Hearing? N  Vision? N  Difficulty concentrating or making decisions? N  Walking or climbing stairs? N  Dressing or bathing? N  Doing errands, shopping? N  Preparing Food and eating ? N  Using the Toilet? N  In the past six months, have you accidently leaked urine? N  Do you have problems with loss of bowel control? N  Managing your Medications? N  Managing your Finances? N  Housekeeping or managing your Housekeeping? N  Some recent data might be hidden    Patient Care Team: Binnie Rail, MD as PCP - General (Internal Medicine) Richmond Campbell, MD as Consulting Physician (Gastroenterology) Renato Shin, MD as Consulting Physician (Endocrinology)    Assessment:    Physical assessment deferred to PCP.  Exercise Activities and Dietary recommendations Current Exercise Habits: The patient does not participate in regular exercise at present;Home exercise routine (plans to join [employer]), Type of exercise: walking (does yard work), Time (Minutes): 30, Frequency (Times/Week): 4, Weekly Exercise (Minutes/Week): 120, Intensity: Mild, Exercise limited by: None identified Diet (meal preparation, eat out, water intake, caffeinated beverages, dairy products, fruits and vegetables): in general, a "healthy" diet  , well balanced  Reviewed heart healthy and diabetic diet, encouraged patient to increase daily water intake. Discussed weight loss tips and portion control. Diet education was provided via handout.  Goals    . Stay as  healthy as independent as possible          I want to start Silver Sneakers and do activities at  the [facility].       Fall Risk Fall Risk  02/16/2017 02/02/2017 11/28/2015 11/27/2015 08/01/2014  Falls in the past year? Yes No Yes Yes No  Number falls in past yr: 1 - - 2 or more -  Injury with Fall? No - No No -  Risk for fall due to : - - Impaired balance/gait Impaired balance/gait;Impaired mobility -  Follow up - - - Falls evaluation completed;Education provided;Falls prevention discussed -   Depression Screen PHQ 2/9 Scores 02/16/2017 02/02/2017 11/28/2015 08/01/2014  PHQ - 2 Score 1 0 0 2  PHQ- 9 Score 1 - -  7     Cognitive Function MMSE - Mini Mental State Exam 02/16/2017  Orientation to time 5  Orientation to Place 5  Registration 3  Attention/ Calculation 5  Recall 2  Language- name 2 objects 2  Language- repeat 1  Language- follow 3 step command 3  Language- read & follow direction 1  Write a sentence 1  Copy design 1  Total score 29        Immunization History  Administered Date(s) Administered  . Hepatitis A 11/14/2003  . Influenza Whole 01/17/2012  . Influenza, High Dose Seasonal PF 02/20/2014  . Influenza-Unspecified 02/24/2013, 02/19/2015, 01/24/2016, 01/07/2017  . Pneumococcal Conjugate-13 03/07/2014  . Pneumococcal Polysaccharide-23 08/02/2015  . Td 06/11/2006  . Tdap 02/04/2016  . Zoster 03/21/2014  . Zoster Recombinat (Shingrix) 09/08/2016   Screening Tests Health Maintenance  Topic Date Due  . HEMOGLOBIN A1C  07/20/2017  . OPHTHALMOLOGY EXAM  07/20/2017  . FOOT EXAM  09/30/2017  . MAMMOGRAM  10/22/2018  . DEXA SCAN  02/03/2021  . COLONOSCOPY  06/12/2025  . TETANUS/TDAP  02/03/2026  . INFLUENZA VACCINE  Completed  . Hepatitis C Screening  Completed  . PNA vac Low Risk Adult  Completed      Plan:  Continue doing brain stimulating activities (puzzles, reading, adult coloring books, staying active) to keep memory sharp.   Continue to eat heart healthy diet (full of fruits, vegetables, whole grains, lean protein, water--limit salt, fat,  and sugar intake) and increase physical activity as tolerated.  Check into [facility] for senior exercise and activities  Check with Dr. Earlean Shawl to see if he is covered by your insurance for your upcoming colonoscopy.  I have personally reviewed and noted the following in the patient's chart:   . Medical and social history . Use of alcohol, tobacco or illicit drugs  . Current medications and supplements . Functional ability and status . Nutritional status . Physical activity . Advanced directives . List of other physicians . Vitals . Screenings to include cognitive, depression, and falls . Referrals and appointments  In addition, I have reviewed and discussed with patient certain preventive protocols, quality metrics, and best practice recommendations. A written personalized care plan for preventive services as well as general preventive health recommendations were provided to patient.     Michiel Cowboy, RN  02/16/2017   Medical screening examination/treatment/procedure(s) were performed by non-physician practitioner and as supervising physician I was immediately available for consultation/collaboration. I agree with above. Binnie Rail, MD

## 2017-02-16 ENCOUNTER — Ambulatory Visit (INDEPENDENT_AMBULATORY_CARE_PROVIDER_SITE_OTHER): Payer: Medicare Other | Admitting: *Deleted

## 2017-02-16 ENCOUNTER — Telehealth: Payer: Self-pay | Admitting: *Deleted

## 2017-02-16 VITALS — BP 132/84 | HR 75 | Resp 20 | Ht 60.0 in | Wt 188.0 lb

## 2017-02-16 DIAGNOSIS — Z0000 Encounter for general adult medical examination without abnormal findings: Secondary | ICD-10-CM | POA: Diagnosis not present

## 2017-02-16 MED ORDER — GLUCOSE BLOOD VI STRP: ORAL_STRIP | 12 refills | Status: DC

## 2017-02-16 MED ORDER — BLOOD GLUCOSE MONITOR KIT: PACK | 0 refills | Status: DC

## 2017-02-16 NOTE — Telephone Encounter (Signed)
rx printed. Please fax.  

## 2017-02-16 NOTE — Patient Instructions (Addendum)
Continue doing brain stimulating activities (puzzles, reading, adult coloring books, staying active) to keep memory sharp.   Continue to eat heart healthy diet (full of fruits, vegetables, whole grains, lean protein, water--limit salt, fat, and sugar intake) and increase physical activity as tolerated.  Check into [facility] for senior exercise and activities  Check with Dr. Earlean Shawl to see if he is covered by your insurance for your upcoming colonoscopy.  Heather [name] , Thank you for taking time to come for your Medicare Wellness Visit. I appreciate your ongoing commitment to your health goals. Please review the following plan we discussed and let me know if I can assist you in the future.   These are the goals we discussed: Goals    . Stay as  healthy as independent as possible          I want to start Silver Sneakers and do activities at the [facility].        This is a list of the screening recommended for you and due dates:  Health Maintenance  Topic Date Due  . Hemoglobin A1C  07/20/2017  . Eye exam for diabetics  07/20/2017  . Complete foot exam   09/30/2017  . Mammogram  10/22/2018  . DEXA scan (bone density measurement)  02/03/2021  . Colon Cancer Screening  06/12/2025  . Tetanus Vaccine  02/03/2026  . Flu Shot  Completed  .  Hepatitis C: One time screening is recommended by Center for Disease Control  (CDC) for  adults born from [birth year] through 1967.   Completed  . Pneumonia vaccines  Completed

## 2017-02-16 NOTE — Telephone Encounter (Signed)
During AWV, patient stated that she needs a prescription for a one touch glucose meter and one touch test strips. The patient's insurance has changed and the [facility] glucose monitoring supplies she currently has is not covered.

## 2017-02-17 NOTE — Telephone Encounter (Signed)
RX faxed to [facility] 

## 2017-02-18 ENCOUNTER — Encounter: Payer: Self-pay | Admitting: Internal Medicine

## 2017-02-23 ENCOUNTER — Other Ambulatory Visit: Payer: Self-pay | Admitting: Internal Medicine

## 2017-02-23 DIAGNOSIS — E782 Mixed hyperlipidemia: Secondary | ICD-10-CM

## 2017-03-11 ENCOUNTER — Other Ambulatory Visit: Payer: Self-pay | Admitting: Internal Medicine

## 2017-03-13 ENCOUNTER — Other Ambulatory Visit: Payer: Self-pay | Admitting: Emergency Medicine

## 2017-03-13 MED ORDER — METFORMIN HCL ER 500 MG PO TB24: 500.0000 mg | ORAL_TABLET | Freq: Every day | ORAL | 1 refills | Status: DC

## 2017-03-27 ENCOUNTER — Other Ambulatory Visit: Payer: Self-pay | Admitting: Internal Medicine

## 2017-04-02 ENCOUNTER — Encounter: Payer: Self-pay | Admitting: Internal Medicine

## 2017-04-06 ENCOUNTER — Other Ambulatory Visit: Payer: Self-pay | Admitting: Emergency Medicine

## 2017-04-06 MED ORDER — GLUCOSE BLOOD VI STRP: ORAL_STRIP | 1 refills | Status: DC

## 2017-04-06 MED ORDER — ONETOUCH DELICA LANCETS 33G MISC: 1 refills | Status: DC

## 2017-04-06 NOTE — Telephone Encounter (Signed)
Verified with pt that it was okay to send to [facility]

## 2017-04-06 NOTE — Telephone Encounter (Signed)
See phone note from 04/06/17 RX refill

## 2017-04-13 ENCOUNTER — Other Ambulatory Visit: Payer: Self-pay

## 2017-04-13 MED ORDER — DULAGLUTIDE 1.5 MG/0.5ML ~~LOC~~ SOAJ: 1.5000 mg | SUBCUTANEOUS | 5 refills | Status: DC

## 2017-04-23 DIAGNOSIS — D120 Benign neoplasm of cecum: Secondary | ICD-10-CM | POA: Diagnosis not present

## 2017-04-23 DIAGNOSIS — D509 Iron deficiency anemia, unspecified: Secondary | ICD-10-CM | POA: Diagnosis not present

## 2017-04-23 DIAGNOSIS — R197 Diarrhea, unspecified: Secondary | ICD-10-CM | POA: Diagnosis not present

## 2017-04-30 ENCOUNTER — Other Ambulatory Visit: Payer: Self-pay | Admitting: Endocrinology

## 2017-05-26 DIAGNOSIS — R197 Diarrhea, unspecified: Secondary | ICD-10-CM | POA: Diagnosis not present

## 2017-06-04 ENCOUNTER — Other Ambulatory Visit: Payer: Self-pay | Admitting: Internal Medicine

## 2017-06-15 ENCOUNTER — Other Ambulatory Visit: Payer: Self-pay | Admitting: Internal Medicine

## 2017-06-28 DIAGNOSIS — H5213 Myopia, bilateral: Secondary | ICD-10-CM | POA: Diagnosis not present

## 2017-06-28 DIAGNOSIS — H25033 Anterior subcapsular polar age-related cataract, bilateral: Secondary | ICD-10-CM | POA: Diagnosis not present

## 2017-06-28 DIAGNOSIS — E119 Type 2 diabetes mellitus without complications: Secondary | ICD-10-CM | POA: Diagnosis not present

## 2017-06-28 DIAGNOSIS — H25043 Posterior subcapsular polar age-related cataract, bilateral: Secondary | ICD-10-CM | POA: Diagnosis not present

## 2017-06-28 DIAGNOSIS — H2513 Age-related nuclear cataract, bilateral: Secondary | ICD-10-CM | POA: Diagnosis not present

## 2017-06-28 LAB — HM DIABETES EYE EXAM

## 2017-07-02 DIAGNOSIS — R197 Diarrhea, unspecified: Secondary | ICD-10-CM | POA: Diagnosis not present

## 2017-07-10 ENCOUNTER — Encounter: Payer: Self-pay | Admitting: Internal Medicine

## 2017-07-20 ENCOUNTER — Encounter: Payer: Self-pay | Admitting: Endocrinology

## 2017-07-20 ENCOUNTER — Ambulatory Visit: Payer: Medicare Other | Admitting: Endocrinology

## 2017-07-20 VITALS — BP 142/88 | HR 94 | Wt 192.4 lb

## 2017-07-20 DIAGNOSIS — E1142 Type 2 diabetes mellitus with diabetic polyneuropathy: Secondary | ICD-10-CM

## 2017-07-20 LAB — POCT GLYCOSYLATED HEMOGLOBIN (HGB A1C): HEMOGLOBIN A1C: 6.4

## 2017-07-20 NOTE — Patient Instructions (Addendum)
Your blood pressure is high today.  Please see your primary care provider soon, to have it rechecked. check your blood sugar once a day.  vary the time of day when you check, between before the 3 meals, and at bedtime.  also check if you have symptoms of your blood sugar being too high or too low.  please keep a record of the readings and bring it to your next appointment here (or you can bring the meter itself).  You can write it on any piece of paper.  please call us sooner if your blood sugar goes below 70, or if you have a lot of readings over 200.  Please stop taking the metformin. Please continue the same other medications.  However, if you are going to be active, skip the nateglinide.  If you are unable to anticipate exercise, eat a light snack with it.   Please come back for a follow-up appointment in 3 months.

## 2017-07-20 NOTE — Progress Notes (Signed)
Subjective:    Patient ID: Heather [name], female    DOB: 09/05/1950, 67 y.o.   MRN: [id number]  HPI Pt returns for f/u of diabetes mellitus: DM type: 2 Dx'ed: 6203 Complications: polyneuropathy Therapy: trulicity and 2 oral meds GDM: never DKA: never Severe hypoglycemia: never Pancreatitis: never Other: she has never been on insulin; short bowel syndrome limits metformin dosage; she did not tolerate farxiga (vaginitis).   Interval history: she brings a record of her cbg's which I have reviewed today.  She checks mostly fasting.  It varies from 79-140.  Pt says Dr Earlean Shawl had told her she has dumping syndrome.   Past Medical History:  Diagnosis Date  . Anemia   . Colon polyps   . Diabetes mellitus without complication ([place])   . Environmental allergies   . GERD (gastroesophageal reflux disease)   . Heart murmur   . History of DVT (deep vein thrombosis) 1970'S  . History of hiatal hernia   . History of transfusion   . Hyperlipidemia   . Hypertension   . Interstitial cystitis    Dr Janice Norrie  . Shortness of breath dyspnea    "due to low hemaglobin"    Past Surgical History:  Procedure Laterality Date  . APPENDECTOMY    . BILATERAL SALPINGOOPHORECTOMY     painful cysts  . COLONOSCOPY  2005   negative; Dr Earlean Shawl. F/U declined  . CYSTOSCOPY     X 4; Dr Janice Norrie  . LAPAROSCOPIC RIGHT COLECTOMY Right 08/19/2015   Procedure: LAPAROSCOPIC ASSISTED  RIGHT COLECTOMY;  Surgeon: Alphonsa Overall, MD;  Location: [facility];  Service: General;  Laterality: Right;  . PREMAGLINANT POLYPS    . TOTAL ABDOMINAL HYSTERECTOMY     metromenorrhagia    Social History   Socioeconomic History  . Marital status: Widowed    Spouse name: Not on file  . Number of children: Not on file  . Years of education: Not on file  . Highest education level: Not on file  Social Needs  . Financial resource strain: Not on file  . Food insecurity - worry: Not on file  . Food insecurity - inability: Not on file  .  Transportation needs - medical: Not on file  . Transportation needs - non-medical: Not on file  Occupational History  . Not on file  Tobacco Use  . Smoking status: Former Smoker    Last attempt to quit: 05/18/1984    Years since quitting: 33.2  . Smokeless tobacco: Never Used  . Tobacco comment: smoked 1972-1986, up to 1 ppd  Substance and Sexual Activity  . Alcohol use: No    Alcohol/week: 0.0 oz  . Drug use: No  . Sexual activity: Not on file  Other Topics Concern  . Not on file  Social History Narrative   Lives with brother, daughter and 2 grandchildren in a 3 story home.  Has 2 children.  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     Highest level of education:  2 years of graduate school    Current Outpatient Medications on File Prior to Visit  Medication Sig Dispense Refill  . Blood Glucose Monitoring Suppl ([facility]S BLOOD GLUCOSE METER) W/DEVICE KIT Use to test blood sugar twice daily ICD 10 E11 9 1 kit 0  . Dulaglutide (TRULICITY) 1.5 TD/9.7CB SOPN Inject 1.5 mg into the skin once a week. 12 pen 5  . gabapentin (NEURONTIN) 300 MG capsule Take  2 capsules (600 mg total) by mouth at bedtime. 180 capsule 1  . glucose blood (ONE TOUCH ULTRA TEST) test strip USE AS DIRECTED UP TO 4  TIMES A DAY 400 each 1  . hydrochlorothiazide (MICROZIDE) 12.5 MG capsule Take 1 capsule (12.5 mg total) by mouth daily. You can take an extra cap prn. 135 capsule 1  . losartan (COZAAR) 100 MG tablet Take 1 tablet (100 mg total) by mouth daily. 90 tablet 1  . Multiple Vitamin (MULTIVITAMIN WITH MINERALS) TABS tablet Take by mouth every morning.    . multivitamin-lutein (OCUVITE-LUTEIN) CAPS capsule Take 1 capsule by mouth daily.    . nateglinide (STARLIX) 120 MG tablet TAKE 1 TABLET (120 MG TOTAL) BY MOUTH 3 (THREE) TIMES DAILY WITH MEALS. 90 tablet 10  . ONETOUCH DELICA LANCETS 54M MISC CHECK BLOOD SUGAR UP TO 4  TIMES DAILY 400 each 1  . ranitidine (ZANTAC)  150 MG tablet Take 150 mg by mouth daily.     . Simethicone (GAS-X PO) Take 1 tablet by mouth daily as needed (gas.).     Marland Kitchen tiZANidine (ZANAFLEX) 2 MG tablet Take 1 tablet (2 mg total) by mouth at bedtime as needed for muscle spasms. 90 tablet 3  . atorvastatin (LIPITOR) 10 MG tablet Take 1 tablet (10 mg total) by mouth daily. (Patient not taking: Reported on 07/20/2017) 90 tablet 1  . blood glucose meter kit and supplies KIT One touch glucometer. Use up to four times daily as directed. (FOR E11.9). (Patient not taking: Reported on 07/20/2017) 1 each 0  . vitamin B-12 (CYANOCOBALAMIN) 250 MCG tablet Take 5,000 mcg by mouth daily.      No current facility-administered medications on file prior to visit.     Allergies  Allergen Reactions  . Flagyl [Metronidazole] Other (See Comments)    Made really sick. Was able to tolerate dose 4/17 with zofran  . Lisinopril     04/03/15 cough reported X several months  . Sulfonamide Derivatives     nausea    Family History  Problem Relation Age of Onset  . Heart attack Mother        in 67s  . Hypertension Mother   . Stroke Mother         in 63s  . Other Father        Deceased, 21  . Stroke Maternal Grandfather 50  . Heart attack Paternal Grandfather        in 59s  . Breast cancer Sister   . Healthy Brother   . Healthy Son   . Healthy Daughter   . Diabetes Neg Hx     BP (!) 142/88 (BP Location: Left Arm, Patient Position: Sitting, Cuff Size: Normal)   Pulse 94   Wt 192 lb 6.4 oz (87.3 kg)   SpO2 98%   BMI 37.58 kg/m    Review of Systems She denies hypoglycemia    Objective:   Physical Exam VITAL SIGNS:  See vs page.  GENERAL: no distress.  Pulses: dorsalis pedis intact bilat.   MSK: no deformity of the feet CV: no leg edema Skin:  no ulcer on the feet, but there are heavy calluses.  normal color and temp on the feet. Neuro: sensation is intact to touch on the feet, but decreased from normal Ext: There is bilateral onychomycosis  of the toenails.    A1c=6.4%  Lab Results  Component Value Date   CREATININE 0.90 02/02/2017   BUN 14 02/02/2017  NA 137 02/02/2017   K 4.1 02/02/2017   CL 100 02/02/2017   CO2 27 02/02/2017       Assessment & Plan:  Type 2 DM, with polyneuropathy: well-controlled Dumping syndrome, new, prob exac by metformin HTN: is noted today  Patient Instructions  Your blood pressure is high today.  Please see your primary care provider soon, to have it rechecked. check your blood sugar once a day.  vary the time of day when you check, between before the 3 meals, and at bedtime.  also check if you have symptoms of your blood sugar being too high or too low.  please keep a record of the readings and bring it to your next appointment here (or you can bring the meter itself).  You can write it on any piece of paper.  please call us sooner if your blood sugar goes below 70, or if you have a lot of readings over 200.  Please stop taking the metformin. Please continue the same other medications.  However, if you are going to be active, skip the nateglinide.  If you are unable to anticipate exercise, eat a light snack with it.   Please come back for a follow-up appointment in 3 months.

## 2017-07-25 ENCOUNTER — Other Ambulatory Visit: Payer: Self-pay | Admitting: Internal Medicine

## 2017-08-01 NOTE — Progress Notes (Signed)
Subjective:    Patient ID: Heather [name], female    DOB: 1950-06-01, 67 y.o.   MRN: [id number]  HPI The patient is here for follow up.  Hypertension: She is taking her medication daily. She is compliant with a low sodium diet.  She denies chest pain, palpitations, edema, shortness of breath and regular headaches. She is exercising some - doing yardwork.  She does not monitor her blood pressure at home.    Hyperlipidemia: She is not taking her medication daily due to fear of possible side effects from the medication. She may try to take it.  She is compliant with a low fat/cholesterol diet. She is exercising  - doing yardwork.    Diabetes: She is following with Dr Loanne Drilling.  Dr Earlean Shawl took her off the metformin due to stomach issues and they improved.  She is taking her other medication daily as prescribed. She is compliant with a diabetic diet. She is exercising  - doing yardwork. She monitors her sugars and they have been running 122-158. She checks her feet daily and denies foot lesions. She is up-to-date with an ophthalmology examination.   Diabetic neuropathy:  She is taking gabapentin daily as prescribed.    Bilateral thumb pain:  She has pain with movement.  She has to apply a heating pad or warm water. She is taking advil.  Her right middle finger is a trigger finger at times.  It does not hurt to pull it back up.  It is weak and stiff.     Right shoulder:  She has aching in her right shoulder, especially at night.  s he takes advil.   Medications and allergies reviewed with patient and updated if appropriate.  Patient Active Problem List   Diagnosis Date Noted  . Nocturnal leg cramps 02/02/2017  . Other chest pain 08/04/2016  . DOE (dyspnea on exertion) 08/04/2016  . Obese 08/04/2016  . Generalized osteoarthritis of hand 02/04/2016  . Diabetic polyneuropathy associated with diabetes mellitus due to underlying condition ([place]) 11/27/2015  . Numbness and tingling of leg  10/09/2015  . Sessile colonic polyp 08/19/2015  . Hiatal hernia, large 08/02/2015  . Colon polyps 08/02/2015  . Anemia, unspecified 04/21/2015  . Diabetes type 2, controlled ([place]) 05/03/2014  . Hyperlipidemia, mixed 03/18/2014  . Epistaxis 09/06/2013  . Interstitial cystitis 07/08/2012  . Essential hypertension 02/10/2011    Current Outpatient Medications on File Prior to Visit  Medication Sig Dispense Refill  . blood glucose meter kit and supplies KIT One touch glucometer. Use up to four times daily as directed. (FOR E11.9). 1 each 0  . Blood Glucose Monitoring Suppl ([facility] BLOOD GLUCOSE METER) W/DEVICE KIT Use to test blood sugar twice daily ICD 10 E11 9 1 kit 0  . Dulaglutide (TRULICITY) 1.5 NV/9.69YO SOPN Inject 1.5 mg into the skin once a week. 12 pen 5  . gabapentin (NEURONTIN) 300 MG capsule TAKE 2 CAPSULES (600 MG TOTAL) BY MOUTH AT BEDTIME. 180 capsule 0  . glucose blood (ONE TOUCH ULTRA TEST) test strip USE AS DIRECTED UP TO 4  TIMES A DAY 400 each 1  . hydrochlorothiazide (MICROZIDE) 12.5 MG capsule Take 1 capsule (12.5 mg total) by mouth daily. You can take an extra cap prn. 135 capsule 1  . losartan (COZAAR) 100 MG tablet Take 1 tablet (100 mg total) by mouth daily. 90 tablet 1  . multivitamin-lutein (OCUVITE-LUTEIN) CAPS capsule Take 1 capsule by mouth daily.    . nateglinide (STARLIX)  120 MG tablet TAKE 1 TABLET (120 MG TOTAL) BY MOUTH 3 (THREE) TIMES DAILY WITH MEALS. 90 tablet 10  . ONETOUCH DELICA LANCETS 32K MISC CHECK BLOOD SUGAR UP TO 4  TIMES DAILY 400 each 1  . ranitidine (ZANTAC) 150 MG tablet Take 150 mg by mouth daily.     . Simethicone (GAS-X PO) Take 1 tablet by mouth daily as needed (gas.).     Marland Kitchen tiZANidine (ZANAFLEX) 2 MG tablet Take 1 tablet (2 mg total) by mouth at bedtime as needed for muscle spasms. 90 tablet 3  . atorvastatin (LIPITOR) 10 MG tablet Take 1 tablet (10 mg total) by mouth daily. (Patient not taking: Reported on 07/20/2017) 90 tablet 1   No  current facility-administered medications on file prior to visit.     Past Medical History:  Diagnosis Date  . Anemia   . Colon polyps   . Diabetes mellitus without complication ([place])   . Environmental allergies   . GERD (gastroesophageal reflux disease)   . Heart murmur   . History of DVT (deep vein thrombosis) 1970'S  . History of hiatal hernia   . History of transfusion   . Hyperlipidemia   . Hypertension   . Interstitial cystitis    Dr Janice Norrie  . Shortness of breath dyspnea    "due to low hemaglobin"    Past Surgical History:  Procedure Laterality Date  . APPENDECTOMY    . BILATERAL SALPINGOOPHORECTOMY     painful cysts  . COLONOSCOPY  2005   negative; Dr Earlean Shawl. F/U declined  . CYSTOSCOPY     X 4; Dr Janice Norrie  . LAPAROSCOPIC RIGHT COLECTOMY Right 08/19/2015   Procedure: LAPAROSCOPIC ASSISTED  RIGHT COLECTOMY;  Surgeon: Alphonsa Overall, MD;  Location: [facility];  Service: General;  Laterality: Right;  . PREMAGLINANT POLYPS    . TOTAL ABDOMINAL HYSTERECTOMY     metromenorrhagia    Social History   Socioeconomic History  . Marital status: Widowed    Spouse name: None  . Number of children: None  . Years of education: None  . Highest education level: None  Social Needs  . Financial resource strain: None  . Food insecurity - worry: None  . Food insecurity - inability: None  . Transportation needs - medical: None  . Transportation needs - non-medical: None  Occupational History  . None  Tobacco Use  . Smoking status: Former Smoker    Last attempt to quit: 05/18/1984    Years since quitting: 33.2  . Smokeless tobacco: Never Used  . Tobacco comment: smoked 1972-1986, up to 1 ppd  Substance and Sexual Activity  . Alcohol use: No    Alcohol/week: 0.0 oz  . Drug use: No  . Sexual activity: None  Other Topics Concern  . None  Social History Narrative   Lives with brother, daughter and 2 grandchildren in a 3 story home.  Has 2 children.  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     Highest level of education:  2 years of graduate school    Family History  Problem Relation Age of Onset  . Heart attack Mother        in 44s  . Hypertension Mother   . Stroke Mother         in 56s  . Other Father        Deceased, 81  . Stroke Maternal Grandfather 50  . Heart attack  Paternal Grandfather        in 63s  . Breast cancer Sister   . Healthy Brother   . Healthy Son   . Healthy Daughter   . Diabetes Neg Hx     Review of Systems  Constitutional: Negative for chills and fever.  Respiratory: Positive for cough (allergy related, dry cough). Negative for shortness of breath and wheezing.   Cardiovascular: Positive for leg swelling. Negative for chest pain and palpitations.  Neurological: Positive for headaches. Negative for dizziness and light-headedness.       Objective:   Vitals:   08/03/17 0835  BP: (!) 144/92  Pulse: 91  Resp: 16  Temp: 97.9 F (36.6 C)  SpO2: 99%   BP Readings from Last 3 Encounters:  08/03/17 (!) 144/92  07/20/17 (!) 142/88  02/16/17 132/84   Wt Readings from Last 3 Encounters:  08/03/17 194 lb (88 kg)  07/20/17 192 lb 6.4 oz (87.3 kg)  02/16/17 188 lb (85.3 kg)   Body mass index is 37.89 kg/m.   Physical Exam    Constitutional: Appears well-developed and well-nourished. No distress.  HENT:  Head: Normocephalic and atraumatic.  Neck: Neck supple. No tracheal deviation present. No thyromegaly present.  No cervical lymphadenopathy Cardiovascular: Normal rate, regular rhythm and normal heart sounds.   2/6 sys murmur heard. No carotid bruit .  No edema Pulmonary/Chest: Effort normal and breath sounds normal. No respiratory distress. No has no wheezes. No rales.  Skin: Skin is warm and dry. Not diaphoretic.  Psychiatric: Normal mood and affect. Behavior is normal.      Assessment & Plan:    See Problem List for Assessment and Plan of chronic medical problems.

## 2017-08-01 NOTE — Patient Instructions (Addendum)
  Test(s) ordered today. Your results will be released to Julian (or called to you) after review, usually within 72hours after test completion. If any changes need to be made, you will be notified at that same time.  All other Health Maintenance issues reviewed.   All recommended immunizations and age-appropriate screenings are up-to-date or discussed.  No immunizations administered today.   Medications reviewed and updated. Changes include increasing hctz to 25 mg daily and gabapentin to 900 mg at night.   Your prescription(s) have been submitted to your pharmacy. Please take as directed and contact our office if you believe you are having problem(s) with the medication(s).   Please followup for a physical exam.

## 2017-08-03 ENCOUNTER — Ambulatory Visit (INDEPENDENT_AMBULATORY_CARE_PROVIDER_SITE_OTHER): Payer: Medicare Other | Admitting: Internal Medicine

## 2017-08-03 ENCOUNTER — Encounter: Payer: Self-pay | Admitting: Internal Medicine

## 2017-08-03 VITALS — BP 144/92 | HR 91 | Temp 97.9°F | Resp 16 | Ht 60.0 in | Wt 194.0 lb

## 2017-08-03 DIAGNOSIS — E782 Mixed hyperlipidemia: Secondary | ICD-10-CM | POA: Diagnosis not present

## 2017-08-03 DIAGNOSIS — E1142 Type 2 diabetes mellitus with diabetic polyneuropathy: Secondary | ICD-10-CM

## 2017-08-03 DIAGNOSIS — E0842 Diabetes mellitus due to underlying condition with diabetic polyneuropathy: Secondary | ICD-10-CM

## 2017-08-03 DIAGNOSIS — I10 Essential (primary) hypertension: Secondary | ICD-10-CM

## 2017-08-03 DIAGNOSIS — M159 Polyosteoarthritis, unspecified: Secondary | ICD-10-CM

## 2017-08-03 MED ORDER — GABAPENTIN 300 MG PO CAPS: 900.0000 mg | ORAL_CAPSULE | Freq: Every day | ORAL | 1 refills | Status: DC

## 2017-08-03 MED ORDER — LOSARTAN POTASSIUM 100 MG PO TABS: 100.0000 mg | ORAL_TABLET | Freq: Every day | ORAL | 1 refills | Status: DC

## 2017-08-03 MED ORDER — HYDROCHLOROTHIAZIDE 25 MG PO TABS: 25.0000 mg | ORAL_TABLET | Freq: Every day | ORAL | 3 refills | Status: DC

## 2017-08-03 NOTE — Assessment & Plan Note (Signed)
Management per Dr Loanne Drilling - she will contact him - she is having some low sugars Stressed regular exercise and weight loss

## 2017-08-03 NOTE — Assessment & Plan Note (Signed)
Has had increased pain and difficulty sleeping She increased her gabapentin to 900 mg at night a few days ago and it helped Continue 900 mg at night

## 2017-08-03 NOTE — Assessment & Plan Note (Signed)
B/l thumbs Right middle trigger finger Taking advil Will let me know what she wants to have a referral to orthopedics

## 2017-08-03 NOTE — Assessment & Plan Note (Signed)
Elevated BP here and elevated at home Takes 12.5 mg of hctz daily , sometimes takes an extra for increased edema Increase hctz to 25 mg daily Continue losartan 100 mg daily.

## 2017-08-03 NOTE — Assessment & Plan Note (Signed)
Not taking statin - concern about possible liver side effects She may try to take in the near future - she understands the risk/benefits of the medication

## 2017-08-14 ENCOUNTER — Other Ambulatory Visit: Payer: Self-pay | Admitting: Internal Medicine

## 2017-08-16 DIAGNOSIS — Z86010 Personal history of colonic polyps: Secondary | ICD-10-CM | POA: Diagnosis not present

## 2017-08-16 DIAGNOSIS — K5730 Diverticulosis of large intestine without perforation or abscess without bleeding: Secondary | ICD-10-CM | POA: Diagnosis not present

## 2017-08-16 DIAGNOSIS — D120 Benign neoplasm of cecum: Secondary | ICD-10-CM | POA: Diagnosis not present

## 2017-08-16 DIAGNOSIS — Z1211 Encounter for screening for malignant neoplasm of colon: Secondary | ICD-10-CM | POA: Diagnosis not present

## 2017-08-16 DIAGNOSIS — Z980 Intestinal bypass and anastomosis status: Secondary | ICD-10-CM | POA: Diagnosis not present

## 2017-08-16 DIAGNOSIS — Z932 Ileostomy status: Secondary | ICD-10-CM | POA: Diagnosis not present

## 2017-08-16 DIAGNOSIS — K648 Other hemorrhoids: Secondary | ICD-10-CM | POA: Diagnosis not present

## 2017-08-16 DIAGNOSIS — K635 Polyp of colon: Secondary | ICD-10-CM | POA: Diagnosis not present

## 2017-08-16 DIAGNOSIS — K5140 Inflammatory polyps of colon without complications: Secondary | ICD-10-CM | POA: Diagnosis not present

## 2017-08-16 LAB — HM COLONOSCOPY

## 2017-08-17 DIAGNOSIS — K317 Polyp of stomach and duodenum: Secondary | ICD-10-CM | POA: Diagnosis not present

## 2017-08-19 ENCOUNTER — Encounter: Payer: Self-pay | Admitting: Internal Medicine

## 2017-08-30 ENCOUNTER — Telehealth: Payer: Self-pay | Admitting: Endocrinology

## 2017-08-30 MED ORDER — REPAGLINIDE 2 MG PO TABS: 2.0000 mg | ORAL_TABLET | Freq: Three times a day (TID) | ORAL | 11 refills | Status: DC

## 2017-08-30 NOTE — Telephone Encounter (Signed)
Patient stated that since you took her off her metformin her b/s have been going up in the 150's is there another alternative please  [id number] - , [place] - [address] [phone] (Phone) [phone] (Fax)

## 2017-08-30 NOTE — Telephone Encounter (Signed)
Please advise 

## 2017-08-30 NOTE — Telephone Encounter (Signed)
I was thinking the metformin might be affecting your bowels, so I have sent a prescription to your pharmacy, to change nateglinide to repaglinide.  Please call or message [place] next week, to tell us how the blood sugar is doing

## 2017-08-31 ENCOUNTER — Other Ambulatory Visit: Payer: Self-pay

## 2017-08-31 NOTE — Telephone Encounter (Signed)
I called patient & gave her directions on repaglinide. She stated that she would call ina week or so & give [place] blood sugar readings.

## 2017-09-06 ENCOUNTER — Other Ambulatory Visit: Payer: Self-pay

## 2017-09-06 ENCOUNTER — Telehealth: Payer: Self-pay | Admitting: Endocrinology

## 2017-09-06 NOTE — Telephone Encounter (Signed)
Patient is requesting a call back to discuss her blood sugar readings,. She stated that doctor has changed her medication and since then her blood sugar is going from really low to jumping very high around 200-300  please advise

## 2017-09-06 NOTE — Telephone Encounter (Signed)
Ever since patient has been on Prandin she has had issues with blood sugars going from high to low. She can't keep them regulated. When she was in [place] mid-morning she had a severe low & felt she was going to pass out. She ate candy & got blood sugar up to 91. Then when she got home she drank juice which spike her BS to 230. Patient stated that fasting blood sugars are over 150. Her lows seem to be mid-morning even when she eat a big breakfast. She said that she does work in the yard a lot in mornings. Patient fears her a1c will again be elevated due to all the jumping around & is afraid of passing out with severe lows. Please advise?

## 2017-09-07 ENCOUNTER — Other Ambulatory Visit: Payer: Self-pay

## 2017-09-07 MED ORDER — REPAGLINIDE 1 MG PO TABS: 1.0000 mg | ORAL_TABLET | Freq: Three times a day (TID) | ORAL | 11 refills | Status: DC

## 2017-09-07 NOTE — Telephone Encounter (Signed)
Patient notified & stated she would let us know how blood sugars are doing.

## 2017-09-07 NOTE — Addendum Note (Signed)
Addended by: Renato Shin on: 09/07/2017 11:57 AM   Modules accepted: Orders

## 2017-09-07 NOTE — Telephone Encounter (Signed)
Please reduce to 1 mg 3 times a day (just before each meal).  You can use up the 2 mg by taking 1/2 at a time.

## 2017-09-16 ENCOUNTER — Encounter: Payer: Medicare Other | Admitting: Internal Medicine

## 2017-10-16 ENCOUNTER — Other Ambulatory Visit: Payer: Self-pay | Admitting: Internal Medicine

## 2017-10-20 ENCOUNTER — Encounter: Payer: Self-pay | Admitting: Endocrinology

## 2017-10-20 ENCOUNTER — Ambulatory Visit: Payer: Medicare Other | Admitting: Endocrinology

## 2017-10-20 VITALS — BP 130/78 | HR 82 | Wt 194.6 lb

## 2017-10-20 DIAGNOSIS — E1142 Type 2 diabetes mellitus with diabetic polyneuropathy: Secondary | ICD-10-CM

## 2017-10-20 LAB — POCT GLYCOSYLATED HEMOGLOBIN (HGB A1C): HEMOGLOBIN A1C: 6.8 % — AB (ref 4.0–5.6)

## 2017-10-20 MED ORDER — EMPAGLIFLOZIN 10 MG PO TABS: 5.0000 mg | ORAL_TABLET | Freq: Every day | ORAL | 5 refills | Status: DC

## 2017-10-20 NOTE — Patient Instructions (Addendum)
check your blood sugar once a day.  vary the time of day when you check, between before the 3 meals, and at bedtime.  also check if you have symptoms of your blood sugar being too high or too low.  please keep a record of the readings and bring it to your next appointment here (or you can bring the meter itself).  You can write it on any piece of paper.  please call us sooner if your blood sugar goes below 70, or if you have a lot of readings over 200.  I have sent a prescription to your pharmacy, to add "jardiance." Please continue the same other medications.  However, if you are going to be active, skip the nateglinide.   Please come back for a follow-up appointment in 4 months.

## 2017-10-20 NOTE — Progress Notes (Signed)
Subjective:    Patient ID: Heather [name], female    DOB: 1951/05/07, 67 y.o.   MRN: [id number]  HPI Pt returns for f/u of diabetes mellitus: DM type: 2 Dx'ed: 4098 Complications: polyneuropathy Therapy: trulicity and nateglinide.   GDM: never DKA: never Severe hypoglycemia: never.  Pancreatitis: never Other: she has never been on insulin; short bowel syndrome limits metformin dosage; she did not tolerate farxiga (vaginitis).   Interval history: she brings a record of her cbg's which I have reviewed today.  She checks mostly fasting.  It varies from 79-140.  Past Medical History:  Diagnosis Date  . Anemia   . Colon polyps   . Diabetes mellitus without complication ([place])   . Environmental allergies   . GERD (gastroesophageal reflux disease)   . Heart murmur   . History of DVT (deep vein thrombosis) 1970'S  . History of hiatal hernia   . History of transfusion   . Hyperlipidemia   . Hypertension   . Interstitial cystitis    Dr Janice Norrie  . Shortness of breath dyspnea    "due to low hemaglobin"    Past Surgical History:  Procedure Laterality Date  . APPENDECTOMY    . BILATERAL SALPINGOOPHORECTOMY     painful cysts  . COLONOSCOPY  2005   negative; Dr Earlean Shawl. F/U declined  . CYSTOSCOPY     X 4; Dr Janice Norrie  . LAPAROSCOPIC RIGHT COLECTOMY Right 08/19/2015   Procedure: LAPAROSCOPIC ASSISTED  RIGHT COLECTOMY;  Surgeon: Alphonsa Overall, MD;  Location: [facility];  Service: General;  Laterality: Right;  . PREMAGLINANT POLYPS    . TOTAL ABDOMINAL HYSTERECTOMY     metromenorrhagia    Social History   Socioeconomic History  . Marital status: Widowed    Spouse name: Not on file  . Number of children: Not on file  . Years of education: Not on file  . Highest education level: Not on file  Occupational History  . Not on file  Social Needs  . Financial resource strain: Not on file  . Food insecurity:    Worry: Not on file    Inability: Not on file  . Transportation needs:   Medical: Not on file    Non-medical: Not on file  Tobacco Use  . Smoking status: Former Smoker    Last attempt to quit: 05/18/1984    Years since quitting: 33.4  . Smokeless tobacco: Never Used  . Tobacco comment: smoked 1972-1986, up to 1 ppd  Substance and Sexual Activity  . Alcohol use: No    Alcohol/week: 0.0 oz  . Drug use: No  . Sexual activity: Not on file  Lifestyle  . Physical activity:    Days per week: Not on file    Minutes per session: Not on file  . Stress: Not on file  Relationships  . Social connections:    Talks on phone: Not on file    Gets together: Not on file    Attends religious service: Not on file    Active member of club or organization: Not on file    Attends meetings of clubs or organizations: Not on file    Relationship status: Not on file  . Intimate partner violence:    Fear of current or ex partner: Not on file    Emotionally abused: Not on file    Physically abused: Not on file    Forced sexual activity: Not on file  Other Topics Concern  . Not on file  Social History Narrative   Lives with brother, daughter and 2 grandchildren in a 3 story home.  Has 2 children.  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     Highest level of education:  2 years of graduate school    Current Outpatient Medications on File Prior to Visit  Medication Sig Dispense Refill  . atorvastatin (LIPITOR) 10 MG tablet TAKE 1 TABLET BY MOUTH EVERY DAY 90 tablet 1  . blood glucose meter kit and supplies KIT One touch glucometer. Use up to four times daily as directed. (FOR E11.9). 1 each 0  . Blood Glucose Monitoring Suppl ([facility] BLOOD GLUCOSE METER) W/DEVICE KIT Use to test blood sugar twice daily ICD 10 E11 9 1 kit 0  . Dulaglutide (TRULICITY) 1.5 YN/8.2NF SOPN Inject 1.5 mg into the skin once a week. 12 pen 5  . gabapentin (NEURONTIN) 300 MG capsule Take 3 capsules (900 mg total) by mouth at bedtime. 270 capsule 1  . glucose  blood (ONE TOUCH ULTRA TEST) test strip USE AS DIRECTED UP TO 4  TIMES A DAY 400 each 1  . hydrochlorothiazide (HYDRODIURIL) 25 MG tablet Take 1 tablet (25 mg total) by mouth daily. 90 tablet 3  . losartan (COZAAR) 100 MG tablet Take 1 tablet (100 mg total) by mouth daily. 90 tablet 1  . multivitamin-lutein (OCUVITE-LUTEIN) CAPS capsule Take 1 capsule by mouth daily.    . nateglinide (STARLIX) 120 MG tablet Take 120 mg by mouth 3 (three) times daily with meals.    Glory Rosebush DELICA LANCETS 62Z MISC CHECK BLOOD SUGAR UP TO 4  TIMES DAILY 400 each 1  . ranitidine (ZANTAC) 150 MG tablet Take 150 mg by mouth daily.     . Simethicone (GAS-X PO) Take 1 tablet by mouth daily as needed (gas.).     Marland Kitchen tiZANidine (ZANAFLEX) 2 MG tablet Take 1 tablet (2 mg total) by mouth at bedtime as needed for muscle spasms. 90 tablet 3   No current facility-administered medications on file prior to visit.     Allergies  Allergen Reactions  . Flagyl [Metronidazole] Other (See Comments)    Made really sick. Was able to tolerate dose 4/17 with zofran  . Lisinopril     04/03/15 cough reported X several months  . Sulfonamide Derivatives     nausea    Family History  Problem Relation Age of Onset  . Heart attack Mother        in 57s  . Hypertension Mother   . Stroke Mother         in 83s  . Other Father        Deceased, 18  . Stroke Maternal Grandfather 50  . Heart attack Paternal Grandfather        in 71s  . Breast cancer Sister   . Healthy Brother   . Healthy Son   . Healthy Daughter   . Diabetes Neg Hx     BP 130/78   Pulse 82   Wt 194 lb 9.6 oz (88.3 kg)   SpO2 97%   BMI 38.01 kg/m    Review of Systems She denies hypoglycemia    Objective:   Physical Exam VITAL SIGNS:  See vs page.  GENERAL: no distress.  Pulses: dorsalis pedis intact bilat.   MSK: no deformity of the feet CV: no leg edema Skin:  no ulcer on the feet, but there are heavy calluses.  normal color and temp on the  feet. Neuro: sensation is intact to touch on the feet, but decreased from normal Ext: There is bilateral onychomycosis of the toenails.   A1c=6.8%  Lab Results  Component Value Date   CREATININE 0.90 02/02/2017   BUN 14 02/02/2017   NA 137 02/02/2017   K 4.1 02/02/2017   CL 100 02/02/2017   CO2 27 02/02/2017       Assessment & Plan:  Type 2 DM, with polyneuropathy: she requests increased rx Short bowel syndrome: this limits rx options.  Patient Instructions  check your blood sugar once a day.  vary the time of day when you check, between before the 3 meals, and at bedtime.  also check if you have symptoms of your blood sugar being too high or too low.  please keep a record of the readings and bring it to your next appointment here (or you can bring the meter itself).  You can write it on any piece of paper.  please call us sooner if your blood sugar goes below 70, or if you have a lot of readings over 200.  I have sent a prescription to your pharmacy, to add "jardiance." Please continue the same other medications.  However, if you are going to be active, skip the nateglinide.   Please come back for a follow-up appointment in 4 months.

## 2017-10-25 DIAGNOSIS — Z803 Family history of malignant neoplasm of breast: Secondary | ICD-10-CM | POA: Diagnosis not present

## 2017-10-25 DIAGNOSIS — Z1231 Encounter for screening mammogram for malignant neoplasm of breast: Secondary | ICD-10-CM | POA: Diagnosis not present

## 2017-10-25 LAB — HM MAMMOGRAPHY

## 2017-10-27 ENCOUNTER — Encounter: Payer: Self-pay | Admitting: Internal Medicine

## 2017-11-21 NOTE — Patient Instructions (Addendum)
Have a chest xray and blood work today.   Test(s) ordered today. Your results will be released to [place] (or called to you) after review, usually within 72hours after test completion. If any changes need to be made, you will be notified at that same time.  All other Health Maintenance issues reviewed.   All recommended immunizations and age-appropriate screenings are up-to-date or discussed.  No immunizations administered today.   Medications reviewed and updated.  No changes recommended at this time.   Please followup in 6 months   Health Maintenance, Female Adopting a healthy lifestyle and getting preventive care can go a long way to promote health and wellness. Talk with your health care provider about what schedule of regular examinations is right for you. This is a good chance for you to check in with your provider about disease prevention and staying healthy. In between checkups, there are plenty of things you can do on your own. Experts have done a lot of research about which lifestyle changes and preventive measures are most likely to keep you healthy. Ask your health care provider for more information. Weight and diet Eat a healthy diet  Be sure to include plenty of vegetables, fruits, low-fat dairy products, and lean protein.  Do not eat a lot of foods high in solid fats, added sugars, or salt.  Get regular exercise. This is one of the most important things you can do for your health. ? Most adults should exercise for at least 150 minutes each week. The exercise should increase your heart rate and make you sweat (moderate-intensity exercise). ? Most adults should also do strengthening exercises at least twice a week. This is in addition to the moderate-intensity exercise.  Maintain a healthy weight  Body mass index (BMI) is a measurement that can be used to identify possible weight problems. It estimates body fat based on height and weight. Your health care provider can help  determine your BMI and help you achieve or maintain a healthy weight.  For females 29 years of age and older: ? A BMI below 18.5 is considered underweight. ? A BMI of 18.5 to 24.9 is normal. ? A BMI of 25 to 29.9 is considered overweight. ? A BMI of 30 and above is considered obese.  Watch levels of cholesterol and blood lipids  You should start having your blood tested for lipids and cholesterol at 69 years of age, then have this test every 5 years.  You may need to have your cholesterol levels checked more often if: ? Your lipid or cholesterol levels are high. ? You are older than 69 years of age. ? You are at high risk for heart disease.  Cancer screening Lung Cancer  Lung cancer screening is recommended for adults 79-28 years old who are at high risk for lung cancer because of a history of smoking.  A yearly low-dose CT scan of the lungs is recommended for people who: ? Currently smoke. ? Have quit within the past 15 years. ? Have at least a 30-pack-year history of smoking. A pack year is smoking an average of one pack of cigarettes a day for 1 year.  Yearly screening should continue until it has been 15 years since you quit.  Yearly screening should stop if you develop a health problem that would prevent you from having lung cancer treatment.  Breast Cancer  Practice breast self-awareness. This means understanding how your breasts normally appear and feel.  It also means doing regular breast  self-exams. Let your health care provider know about any changes, no matter how small.  If you are in your 20s or 30s, you should have a clinical breast exam (CBE) by a health care provider every 1-3 years as part of a regular health exam.  If you are 40 or older, have a CBE every year. Also consider having a breast X-ray (mammogram) every year.  If you have a family history of breast cancer, talk to your health care provider about genetic screening.  If you are at high risk for  breast cancer, talk to your health care provider about having an MRI and a mammogram every year.  Breast cancer gene (BRCA) assessment is recommended for women who have family members with BRCA-related cancers. BRCA-related cancers include: ? Breast. ? Ovarian. ? Tubal. ? Peritoneal cancers.  Results of the assessment will determine the need for genetic counseling and BRCA1 and BRCA2 testing.  Cervical Cancer Your health care provider may recommend that you be screened regularly for cancer of the pelvic organs (ovaries, uterus, and vagina). This screening involves a pelvic examination, including checking for microscopic changes to the surface of your cervix (Pap test). You may be encouraged to have this screening done every 3 years, beginning at age 21.  For women ages 30-65, health care providers may recommend pelvic exams and Pap testing every 3 years, or they may recommend the Pap and pelvic exam, combined with testing for human papilloma virus (HPV), every 5 years. Some types of HPV increase your risk of cervical cancer. Testing for HPV may also be done on women of any age with unclear Pap test results.  Other health care providers may not recommend any screening for nonpregnant women who are considered low risk for pelvic cancer and who do not have symptoms. Ask your health care provider if a screening pelvic exam is right for you.  If you have had past treatment for cervical cancer or a condition that could lead to cancer, you need Pap tests and screening for cancer for at least 20 years after your treatment. If Pap tests have been discontinued, your risk factors (such as having a new sexual partner) need to be reassessed to determine if screening should resume. Some women have medical problems that increase the chance of getting cervical cancer. In these cases, your health care provider may recommend more frequent screening and Pap tests.  Colorectal Cancer  This type of cancer can be  detected and often prevented.  Routine colorectal cancer screening usually begins at 69 years of age and continues through 69 years of age.  Your health care provider may recommend screening at an earlier age if you have risk factors for colon cancer.  Your health care provider may also recommend using home test kits to check for hidden blood in the stool.  A small camera at the end of a tube can be used to examine your colon directly (sigmoidoscopy or colonoscopy). This is done to check for the earliest forms of colorectal cancer.  Routine screening usually begins at age 50.  Direct examination of the colon should be repeated every 5-10 years through 69 years of age. However, you may need to be screened more often if early forms of precancerous polyps or small growths are found.  Skin Cancer  Check your skin from head to toe regularly.  Tell your health care provider about any new moles or changes in moles, especially if there is a change in a mole's shape or   color.  Also tell your health care provider if you have a mole that is larger than the size of a pencil eraser.  Always use sunscreen. Apply sunscreen liberally and repeatedly throughout the day.  Protect yourself by wearing long sleeves, pants, a wide-brimmed hat, and sunglasses whenever you are outside.  Heart disease, diabetes, and high blood pressure  High blood pressure causes heart disease and increases the risk of stroke. High blood pressure is more likely to develop in: ? People who have blood pressure in the high end of the normal range (130-139/85-89 mm Hg). ? People who are overweight or obese. ? People who are African American.  If you are 18-39 years of age, have your blood pressure checked every 3-5 years. If you are 40 years of age or older, have your blood pressure checked every year. You should have your blood pressure measured twice-once when you are at a hospital or clinic, and once when you are not at a  hospital or clinic. Record the average of the two measurements. To check your blood pressure when you are not at a hospital or clinic, you can use: ? An automated blood pressure machine at a pharmacy. ? A home blood pressure monitor.  If you are between 55 years and 79 years old, ask your health care provider if you should take aspirin to prevent strokes.  Have regular diabetes screenings. This involves taking a blood sample to check your fasting blood sugar level. ? If you are at a normal weight and have a low risk for diabetes, have this test once every three years after 69 years of age. ? If you are overweight and have a high risk for diabetes, consider being tested at a younger age or more often. Preventing infection Hepatitis B  If you have a higher risk for hepatitis B, you should be screened for this virus. You are considered at high risk for hepatitis B if: ? You were born in a country where hepatitis B is common. Ask your health care provider which countries are considered high risk. ? Your parents were born in a high-risk country, and you have not been immunized against hepatitis B (hepatitis B vaccine). ? You have HIV or AIDS. ? You use needles to inject street drugs. ? You live with someone who has hepatitis B. ? You have had sex with someone who has hepatitis B. ? You get hemodialysis treatment. ? You take certain medicines for conditions, including cancer, organ transplantation, and autoimmune conditions.  Hepatitis C  Blood testing is recommended for: ? Everyone born from 1945 through 1965. ? Anyone with known risk factors for hepatitis C.  Sexually transmitted infections (STIs)  You should be screened for sexually transmitted infections (STIs) including gonorrhea and chlamydia if: ? You are sexually active and are younger than 69 years of age. ? You are older than 69 years of age and your health care provider tells you that you are at risk for this type of  infection. ? Your sexual activity has changed since you were last screened and you are at an increased risk for chlamydia or gonorrhea. Ask your health care provider if you are at risk.  If you do not have HIV, but are at risk, it may be recommended that you take a prescription medicine daily to prevent HIV infection. This is called pre-exposure prophylaxis (PrEP). You are considered at risk if: ? You are sexually active and do not regularly use condoms or know the HIV   status of your partner(s). ? You take drugs by injection. ? You are sexually active with a partner who has HIV.  Talk with your health care provider about whether you are at high risk of being infected with HIV. If you choose to begin PrEP, you should first be tested for HIV. You should then be tested every 3 months for as long as you are taking PrEP. Pregnancy  If you are premenopausal and you may become pregnant, ask your health care provider about preconception counseling.  If you may become pregnant, take 400 to 800 micrograms (mcg) of folic acid every day.  If you want to prevent pregnancy, talk to your health care provider about birth control (contraception). Osteoporosis and menopause  Osteoporosis is a disease in which the bones lose minerals and strength with aging. This can result in serious bone fractures. Your risk for osteoporosis can be identified using a bone density scan.  If you are 75 years of age or older, or if you are at risk for osteoporosis and fractures, ask your health care provider if you should be screened.  Ask your health care provider whether you should take a calcium or vitamin D supplement to lower your risk for osteoporosis.  Menopause may have certain physical symptoms and risks.  Hormone replacement therapy may reduce some of these symptoms and risks. Talk to your health care provider about whether hormone replacement therapy is right for you. Follow these instructions at home:  Schedule  regular health, dental, and eye exams.  Stay current with your immunizations.  Do not use any tobacco products including cigarettes, chewing tobacco, or electronic cigarettes.  If you are pregnant, do not drink alcohol.  If you are breastfeeding, limit how much and how often you drink alcohol.  Limit alcohol intake to no more than 1 drink per day for nonpregnant women. One drink equals 12 ounces of beer, 5 ounces of wine, or 1 ounces of hard liquor.  Do not use street drugs.  Do not share needles.  Ask your health care provider for help if you need support or information about quitting drugs.  Tell your health care provider if you often feel depressed.  Tell your health care provider if you have ever been abused or do not feel safe at home. This information is not intended to replace advice given to you by your health care provider. Make sure you discuss any questions you have with your health care provider. Document Released: 11/17/2010 Document Revised: 10/10/2015 Document Reviewed: 02/05/2015 Elsevier Interactive Patient Education  [employer].

## 2017-11-21 NOTE — Progress Notes (Signed)
Subjective:    Patient ID: Heather [name], female    DOB: [birth year], 67 y.o.   MRN: [id number]  HPI She is here for a physical exam.   Chronic dry cough for at least 7 months.  She is an ex- smoker.  She does have a history of a positive PPD, but did not take medication due to her age.  She is also a former smoker.  She has GERD that is not ideally controlled - she sees GI.    Medications and allergies reviewed with patient and updated if appropriate.  Patient Active Problem List   Diagnosis Date Noted  . Cataract 11/22/2017  . Nocturnal leg cramps 02/02/2017  . DOE (dyspnea on exertion) 08/04/2016  . Obese 08/04/2016  . Generalized osteoarthritis of hand 02/04/2016  . Diabetic polyneuropathy associated with diabetes mellitus due to underlying condition ([place]) 11/27/2015  . Numbness and tingling of leg 10/09/2015  . Sessile colonic polyp 08/19/2015  . Hiatal hernia, large 08/02/2015  . Colon polyps 08/02/2015  . Anemia, unspecified 04/21/2015  . Diabetes type 2, controlled ([place]) 05/03/2014  . Hyperlipidemia, mixed 03/18/2014  . Interstitial cystitis 07/08/2012  . Essential hypertension 02/10/2011    Current Outpatient Medications on File Prior to Visit  Medication Sig Dispense Refill  . atorvastatin (LIPITOR) 10 MG tablet TAKE 1 TABLET BY MOUTH EVERY DAY 90 tablet 1  . blood glucose meter kit and supplies KIT One touch glucometer. Use up to four times daily as directed. (FOR E11.9). 1 each 0  . Dulaglutide (TRULICITY) 1.5 SP/2.3RA SOPN Inject 1.5 mg into the skin once a week. 12 pen 5  . empagliflozin (JARDIANCE) 10 MG TABS tablet Take 5 mg by mouth daily. 30 tablet 5  . gabapentin (NEURONTIN) 300 MG capsule Take 3 capsules (900 mg total) by mouth at bedtime. 270 capsule 1  . glucose blood (ONE TOUCH ULTRA TEST) test strip USE AS DIRECTED UP TO 4  TIMES A DAY 400 each 1  . hydrochlorothiazide (HYDRODIURIL) 25 MG tablet Take 1 tablet (25 mg total) by mouth daily. 90 tablet  3  . losartan (COZAAR) 100 MG tablet Take 1 tablet (100 mg total) by mouth daily. 90 tablet 1  . multivitamin-lutein (OCUVITE-LUTEIN) CAPS capsule Take 1 capsule by mouth daily.    . nateglinide (STARLIX) 120 MG tablet Take 120 mg by mouth 3 (three) times daily with meals.    Glory Rosebush DELICA LANCETS 07M MISC CHECK BLOOD SUGAR UP TO 4  TIMES DAILY 400 each 1  . ranitidine (ZANTAC) 150 MG tablet Take 150 mg by mouth daily.     . Simethicone (GAS-X PO) Take 1 tablet by mouth daily as needed (gas.).     Marland Kitchen tiZANidine (ZANAFLEX) 2 MG tablet Take 1 tablet (2 mg total) by mouth at bedtime as needed for muscle spasms. 90 tablet 3   No current facility-administered medications on file prior to visit.     Past Medical History:  Diagnosis Date  . Anemia   . Colon polyps   . Diabetes mellitus without complication ([place])   . Environmental allergies   . GERD (gastroesophageal reflux disease)   . Heart murmur   . History of DVT (deep vein thrombosis) 1970'S  . History of hiatal hernia   . History of transfusion   . Hyperlipidemia   . Hypertension   . Interstitial cystitis    Dr Janice Norrie  . Shortness of breath dyspnea    "due to low hemaglobin"  Past Surgical History:  Procedure Laterality Date  . APPENDECTOMY    . BILATERAL SALPINGOOPHORECTOMY     painful cysts  . COLONOSCOPY  2005   negative; Dr Earlean Shawl. F/U declined  . CYSTOSCOPY     X 4; Dr Janice Norrie  . LAPAROSCOPIC RIGHT COLECTOMY Right 08/19/2015   Procedure: LAPAROSCOPIC ASSISTED  RIGHT COLECTOMY;  Surgeon: Alphonsa Overall, MD;  Location: [facility];  Service: General;  Laterality: Right;  . PREMAGLINANT POLYPS    . TOTAL ABDOMINAL HYSTERECTOMY     metromenorrhagia    Social History   Socioeconomic History  . Marital status: Widowed    Spouse name: Not on file  . Number of children: Not on file  . Years of education: Not on file  . Highest education level: Not on file  Occupational History  . Not on file  Social Needs  . Financial  resource strain: Not on file  . Food insecurity:    Worry: Not on file    Inability: Not on file  . Transportation needs:    Medical: Not on file    Non-medical: Not on file  Tobacco Use  . Smoking status: Former Smoker    Last attempt to quit: 05/18/1984    Years since quitting: 33.5  . Smokeless tobacco: Never Used  . Tobacco comment: smoked 1972-1986, up to 1 ppd  Substance and Sexual Activity  . Alcohol use: No    Alcohol/week: 0.0 oz  . Drug use: No  . Sexual activity: Not on file  Lifestyle  . Physical activity:    Days per week: Not on file    Minutes per session: Not on file  . Stress: Not on file  Relationships  . Social connections:    Talks on phone: Not on file    Gets together: Not on file    Attends religious service: Not on file    Active member of club or organization: Not on file    Attends meetings of clubs or organizations: Not on file    Relationship status: Not on file  Other Topics Concern  . Not on file  Social History Narrative   Lives with brother, daughter and 2 grandchildren in a 3 story home.  Has 2 children.  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     Highest level of education:  2 years of graduate school    Family History  Problem Relation Age of Onset  . Heart attack Mother        in 43s  . Hypertension Mother   . Stroke Mother         in 39s  . Other Father        Deceased, 53  . Stroke Maternal Grandfather 50  . Heart attack Paternal Grandfather        in 14s  . Breast cancer Sister   . Healthy Brother   . Healthy Son   . Healthy Daughter   . Diabetes Neg Hx     Review of Systems  Constitutional: Positive for unexpected weight change. Negative for chills, fatigue and fever.  Eyes: Negative for visual disturbance.  Respiratory: Positive for cough (dry) and shortness of breath (with exertion and with humidity). Negative for wheezing.   Cardiovascular: Negative for chest pain,  palpitations and leg swelling.  Gastrointestinal: Negative for abdominal pain, blood in stool, constipation, diarrhea and nausea.       GERD - sees  GI  Genitourinary: Negative for dysuria and hematuria.  Musculoskeletal: Positive for arthralgias.  Skin: Negative for color change and rash.  Neurological: Positive for light-headedness (occ) and headaches (occ).  Psychiatric/Behavioral: Negative for dysphoric mood. The patient is not nervous/anxious.        Objective:   Vitals:   11/22/17 0749  BP: 114/76  Pulse: 75  Resp: 16  Temp: 98.5 F (36.9 C)  SpO2: 97%   Filed Weights   11/22/17 0749  Weight: 188 lb (85.3 kg)   Body mass index is 36.72 kg/m.  Wt Readings from Last 3 Encounters:  11/22/17 188 lb (85.3 kg)  10/20/17 194 lb 9.6 oz (88.3 kg)  08/03/17 194 lb (88 kg)     Physical Exam Constitutional: She appears well-developed and well-nourished. No distress.  HENT:  Head: Normocephalic and atraumatic.  Right Ear: External ear normal. Normal ear canal and TM Left Ear: External ear normal.  Normal ear canal and TM Mouth/Throat: Oropharynx is clear and moist.  Eyes: Conjunctivae and EOM are normal.  Neck: Neck supple. No tracheal deviation present. No thyromegaly present.  No carotid bruit  Cardiovascular: Normal rate, regular rhythm and normal heart sounds.   2/6 systolic murmur heard.  No edema. Pulmonary/Chest: Effort normal and breath sounds normal. No respiratory distress. She has no wheezes. She has no rales.  Breast: deferred Abdominal: Soft. She exhibits no distension. There is no tenderness.  Lymphadenopathy: She has no cervical adenopathy.  Skin: Skin is warm and dry. She is not diaphoretic.  Psychiatric: She has a normal mood and affect. Her behavior is normal.        Assessment & Plan:   Physical exam: Screening blood work  ordered Immunizations   All up to date - had shingrix Colonoscopy   Up to date  Mammogram   Up to date     Dexa    Up to  date  Eye exams    Up to date  EKG        Done 11/2014 Exercise   None  Weight   Advised weight loss Skin   No concerns Substance abuse   none  See Problem List for Assessment and Plan of chronic medical problems.   FU in 6 months

## 2017-11-22 ENCOUNTER — Ambulatory Visit (INDEPENDENT_AMBULATORY_CARE_PROVIDER_SITE_OTHER): Payer: Medicare Other | Admitting: Internal Medicine

## 2017-11-22 ENCOUNTER — Encounter: Payer: Self-pay | Admitting: Internal Medicine

## 2017-11-22 ENCOUNTER — Other Ambulatory Visit (INDEPENDENT_AMBULATORY_CARE_PROVIDER_SITE_OTHER): Payer: Medicare Other

## 2017-11-22 ENCOUNTER — Ambulatory Visit (INDEPENDENT_AMBULATORY_CARE_PROVIDER_SITE_OTHER)
Admission: RE | Admit: 2017-11-22 | Discharge: 2017-11-22 | Disposition: A | Discharge status: Home / Self Care | Payer: Medicare Other | Source: Ambulatory Visit | Attending: Internal Medicine | Admitting: Internal Medicine

## 2017-11-22 VITALS — BP 114/76 | HR 75 | Temp 98.5°F | Resp 16 | Ht 60.0 in | Wt 188.0 lb

## 2017-11-22 DIAGNOSIS — R05 Cough: Secondary | ICD-10-CM | POA: Diagnosis not present

## 2017-11-22 DIAGNOSIS — E1142 Type 2 diabetes mellitus with diabetic polyneuropathy: Secondary | ICD-10-CM | POA: Diagnosis not present

## 2017-11-22 DIAGNOSIS — I10 Essential (primary) hypertension: Secondary | ICD-10-CM

## 2017-11-22 DIAGNOSIS — E782 Mixed hyperlipidemia: Secondary | ICD-10-CM

## 2017-11-22 DIAGNOSIS — H269 Unspecified cataract: Secondary | ICD-10-CM | POA: Insufficient documentation

## 2017-11-22 DIAGNOSIS — M159 Polyosteoarthritis, unspecified: Secondary | ICD-10-CM | POA: Diagnosis not present

## 2017-11-22 DIAGNOSIS — Z0000 Encounter for general adult medical examination without abnormal findings: Secondary | ICD-10-CM

## 2017-11-22 DIAGNOSIS — R058 Other specified cough: Secondary | ICD-10-CM

## 2017-11-22 DIAGNOSIS — E0842 Diabetes mellitus due to underlying condition with diabetic polyneuropathy: Secondary | ICD-10-CM

## 2017-11-22 LAB — LIPID PANEL
CHOL/HDL RATIO: 3
CHOLESTEROL: 178 mg/dL (ref 0–200)
HDL: 55.9 mg/dL (ref >39.00)
LDL CALC: 85 mg/dL (ref 0–99)
NONHDL: 122.09
Triglycerides: 186 mg/dL — ABNORMAL HIGH (ref 0.0–149.0)
VLDL: 37.2 mg/dL (ref 0.0–40.0)

## 2017-11-22 LAB — CBC WITH DIFFERENTIAL/PLATELET
Basophils Absolute: 0.1 10*3/uL (ref 0.0–0.1)
Basophils Relative: 0.8 % (ref 0.0–3.0)
EOS PCT: 3.3 % (ref 0.0–5.0)
Eosinophils Absolute: 0.2 10*3/uL (ref 0.0–0.7)
HCT: 36.8 % (ref 36.0–46.0)
Hemoglobin: 12.1 g/dL (ref 12.0–15.0)
Lymphocytes Relative: 30.9 % (ref 12.0–46.0)
Lymphs Abs: 2.1 10*3/uL (ref 0.7–4.0)
MCHC: 32.8 g/dL (ref 30.0–36.0)
MCV: 73.6 fl — ABNORMAL LOW (ref 78.0–100.0)
MONO ABS: 0.6 10*3/uL (ref 0.1–1.0)
Monocytes Relative: 9.2 % (ref 3.0–12.0)
NEUTROS ABS: 3.8 10*3/uL (ref 1.4–7.7)
NEUTROS PCT: 55.8 % (ref 43.0–77.0)
Platelets: 372 10*3/uL (ref 150.0–400.0)
RBC: 5 Mil/uL (ref 3.87–5.11)
RDW: 14.3 % (ref 11.5–15.5)
WBC: 6.8 10*3/uL (ref 4.0–10.5)

## 2017-11-22 LAB — COMPREHENSIVE METABOLIC PANEL
ALBUMIN: 4.2 g/dL (ref 3.5–5.2)
ALT: 16 U/L (ref 0–35)
AST: 15 U/L (ref 0–37)
Alkaline Phosphatase: 56 U/L (ref 39–117)
BUN: 17 mg/dL (ref 6–23)
CO2: 27 meq/L (ref 19–32)
CREATININE: 0.99 mg/dL (ref 0.40–1.20)
Calcium: 10 mg/dL (ref 8.4–10.5)
Chloride: 100 mEq/L (ref 96–112)
GFR: 59.09 mL/min — ABNORMAL LOW (ref >60.00)
Glucose, Bld: 116 mg/dL — ABNORMAL HIGH (ref 70–99)
POTASSIUM: 4 meq/L (ref 3.5–5.1)
SODIUM: 137 meq/L (ref 135–145)
Total Bilirubin: 0.9 mg/dL (ref 0.2–1.2)
Total Protein: 7.2 g/dL (ref 6.0–8.3)

## 2017-11-22 LAB — TSH: TSH: 1.24 u[IU]/mL (ref 0.35–4.50)

## 2017-11-22 MED ORDER — LOSARTAN POTASSIUM 100 MG PO TABS: 100.0000 mg | ORAL_TABLET | Freq: Every day | ORAL | 1 refills | Status: DC

## 2017-11-22 MED ORDER — HYDROCHLOROTHIAZIDE 25 MG PO TABS: 25.0000 mg | ORAL_TABLET | Freq: Every day | ORAL | 3 refills | Status: DC

## 2017-11-22 MED ORDER — ATORVASTATIN CALCIUM 10 MG PO TABS
10.0000 mg | ORAL_TABLET | Freq: Every day | ORAL | 1 refills | Status: DC
Start: 2017-11-22 — End: 2018-05-25

## 2017-11-22 MED ORDER — GABAPENTIN 300 MG PO CAPS: 900.0000 mg | ORAL_CAPSULE | Freq: Every day | ORAL | 1 refills | Status: DC

## 2017-11-22 NOTE — Assessment & Plan Note (Signed)
Taking lipitor x 3 months now Check lipids, cmp, tsh Encouraged exercise and weight loss

## 2017-11-22 NOTE — Assessment & Plan Note (Signed)
Right middle finger trigger finger, diffuse hand arthritis Takes advil prn only Takes tizanidine at night which helps Continue same

## 2017-11-22 NOTE — Assessment & Plan Note (Signed)
Taking gabapentin 900 mg at night which is effective Continue above dose

## 2017-11-22 NOTE — Assessment & Plan Note (Signed)
Sugars 100-140 for the most part Taking medications Management per Dr Loanne Drilling Check a1c

## 2017-11-22 NOTE — Assessment & Plan Note (Signed)
BP well controlled Current regimen effective and well tolerated Continue current medications at current doses cmp  

## 2017-11-22 NOTE — Assessment & Plan Note (Signed)
H/o positive PPD GERD frequently, possible allergies, ? Related to losartan, need to r/o TB, h/o of smoker CXR today Discussed annual CT for lung cancer screening - deferred at this time

## 2017-11-23 ENCOUNTER — Encounter: Payer: Self-pay | Admitting: Internal Medicine

## 2018-01-14 ENCOUNTER — Other Ambulatory Visit: Payer: Self-pay | Admitting: Internal Medicine

## 2018-01-15 ENCOUNTER — Other Ambulatory Visit: Payer: Self-pay | Admitting: Endocrinology

## 2018-03-25 ENCOUNTER — Other Ambulatory Visit: Payer: Self-pay

## 2018-03-25 NOTE — Patient Outreach (Signed)
[facility] Care Management  03/25/2018  KIMBA LOTTES 27-Feb-1951 [id number]   Medication Adherence call to Mrs. Adianna Darwin spoke with patient she is due on Atorvastatin 10 mg she ask if we can call [facility] and order this medication she had stop taking it but patient  spoke with her doctor and  was told the importance of taking the cholesterol medication. Mrs. Hudson is showing past due under [place].   [place] Management Direct Dial [phone]  Fax [phone] [email or web address]

## 2018-04-21 ENCOUNTER — Telehealth: Payer: Self-pay | Admitting: Internal Medicine

## 2018-04-21 ENCOUNTER — Encounter: Payer: Self-pay | Admitting: Endocrinology

## 2018-04-21 ENCOUNTER — Ambulatory Visit: Payer: Medicare Other | Admitting: Endocrinology

## 2018-04-21 VITALS — BP 116/72 | HR 73 | Temp 98.5°F | Wt 193.6 lb

## 2018-04-21 DIAGNOSIS — E1142 Type 2 diabetes mellitus with diabetic polyneuropathy: Secondary | ICD-10-CM | POA: Diagnosis not present

## 2018-04-21 LAB — POCT GLYCOSYLATED HEMOGLOBIN (HGB A1C): HbA1c POC (<> result, manual entry): 6.7 % (ref 4.0–5.6)

## 2018-04-21 MED ORDER — DULAGLUTIDE 1.5 MG/0.5ML ~~LOC~~ SOAJ: 1.5000 mg | SUBCUTANEOUS | 5 refills | Status: DC

## 2018-04-21 MED ORDER — NATEGLINIDE 120 MG PO TABS: 120.0000 mg | ORAL_TABLET | Freq: Three times a day (TID) | ORAL | 11 refills | Status: DC

## 2018-04-21 MED ORDER — GLUCOSE BLOOD VI STRP: 1.0000 | ORAL_STRIP | Freq: Every day | 3 refills | Status: DC

## 2018-04-21 NOTE — Patient Instructions (Addendum)
check your blood sugar once a day.  vary the time of day when you check, between before the 3 meals, and at bedtime.  also check if you have symptoms of your blood sugar being too high or too low.  please keep a record of the readings and bring it to your next appointment here (or you can bring the meter itself).  You can write it on any piece of paper.  please call us sooner if your blood sugar goes below 70, or if you have a lot of readings over 200.  Please continue the same medications.  However, if you are going to be active, skip the nateglinide.   Please come back for a follow-up appointment in 4 months.

## 2018-04-21 NOTE — Telephone Encounter (Signed)
Heather [name] stated that she would like to increase her prescription of gabapentin 300 mg to a higher dosage due to pain in feet and toes. Would like for someone to give her a call. SF

## 2018-04-21 NOTE — Progress Notes (Signed)
Subjective:    Patient ID: Heather [name], female    DOB: 1951/03/10, 67 y.o.   MRN: [id number]  HPI Pt returns for f/u of diabetes mellitus: DM type: 2 Dx'ed: 7416 Complications: polyneuropathy Therapy: trulicity and 2 oral meds.   GDM: never DKA: never Severe hypoglycemia: never.  Pancreatitis: never Other: she has never been on insulin; short bowel syndrome limits metformin dosage; she did not tolerate farxiga (vaginitis).   Interval history: she brings a record of her cbg's which I have reviewed today.  It varies from 99-169. She stopped jardiance, due to vaginitis.  She seldom has hypoglycemia, and these episodes are mild.  This happens when she takes nateglinide, but forgets to eat.   Past Medical History:  Diagnosis Date  . Anemia   . Colon polyps   . Diabetes mellitus without complication ([place])   . Environmental allergies   . GERD (gastroesophageal reflux disease)   . Heart murmur   . History of DVT (deep vein thrombosis) 1970'S  . History of hiatal hernia   . History of transfusion   . Hyperlipidemia   . Hypertension   . Interstitial cystitis    Dr Janice Norrie  . Shortness of breath dyspnea    "due to low hemaglobin"    Past Surgical History:  Procedure Laterality Date  . APPENDECTOMY    . BILATERAL SALPINGOOPHORECTOMY     painful cysts  . COLONOSCOPY  2005   negative; Dr Earlean Shawl. F/U declined  . CYSTOSCOPY     X 4; Dr Janice Norrie  . LAPAROSCOPIC RIGHT COLECTOMY Right 08/19/2015   Procedure: LAPAROSCOPIC ASSISTED  RIGHT COLECTOMY;  Surgeon: Alphonsa Overall, MD;  Location: [facility];  Service: General;  Laterality: Right;  . PREMAGLINANT POLYPS    . TOTAL ABDOMINAL HYSTERECTOMY     metromenorrhagia    Social History   Socioeconomic History  . Marital status: Widowed    Spouse name: Not on file  . Number of children: Not on file  . Years of education: Not on file  . Highest education level: Not on file  Occupational History  . Not on file  Social Needs  . Financial  resource strain: Not on file  . Food insecurity:    Worry: Not on file    Inability: Not on file  . Transportation needs:    Medical: Not on file    Non-medical: Not on file  Tobacco Use  . Smoking status: Former Smoker    Last attempt to quit: 05/18/1984    Years since quitting: 33.9  . Smokeless tobacco: Never Used  . Tobacco comment: smoked 1972-1986, up to 1 ppd  Substance and Sexual Activity  . Alcohol use: No    Alcohol/week: 0.0 standard drinks  . Drug use: No  . Sexual activity: Not on file  Lifestyle  . Physical activity:    Days per week: Not on file    Minutes per session: Not on file  . Stress: Not on file  Relationships  . Social connections:    Talks on phone: Not on file    Gets together: Not on file    Attends religious service: Not on file    Active member of club or organization: Not on file    Attends meetings of clubs or organizations: Not on file    Relationship status: Not on file  . Intimate partner violence:    Fear of current or ex partner: Not on file    Emotionally abused: Not on  file    Physically abused: Not on file    Forced sexual activity: Not on file  Other Topics Concern  . Not on file  Social History Narrative   Lives with brother, daughter and 2 grandchildren in a 3 story home.  Has 2 children.  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     Highest level of education:  2 years of graduate school    Current Outpatient Medications on File Prior to Visit  Medication Sig Dispense Refill  . atorvastatin (LIPITOR) 10 MG tablet Take 1 tablet (10 mg total) by mouth daily. 90 tablet 1  . blood glucose meter kit and supplies KIT One touch glucometer. Use up to four times daily as directed. (FOR E11.9). 1 each 0  . hydrochlorothiazide (HYDRODIURIL) 25 MG tablet Take 1 tablet (25 mg total) by mouth daily. 90 tablet 3  . losartan (COZAAR) 100 MG tablet Take 1 tablet (100 mg total) by mouth daily. 90  tablet 1  . multivitamin-lutein (OCUVITE-LUTEIN) CAPS capsule Take 1 capsule by mouth daily.    . ranitidine (ZANTAC) 150 MG tablet Take 150 mg by mouth daily.     . Simethicone (GAS-X PO) Take 1 tablet by mouth daily as needed (gas.).     Marland Kitchen tiZANidine (ZANAFLEX) 2 MG tablet TAKE 1 TABLET (2 MG TOTAL) BY MOUTH AT BEDTIME AS NEEDED FOR MUSCLE SPASMS. 90 tablet 3   No current facility-administered medications on file prior to visit.     Allergies  Allergen Reactions  . Farxiga [Dapagliflozin]     Constant Vaginal Infections  . Flagyl [Metronidazole] Other (See Comments)    Made really sick. Was able to tolerate dose 4/17 with zofran  . Jardiance [Empagliflozin]     Constant Vaginal infections  . Lisinopril     04/03/15 cough reported X several months  . Sulfonamide Derivatives     nausea    Family History  Problem Relation Age of Onset  . Heart attack Mother        in 34s  . Hypertension Mother   . Stroke Mother         in 28s  . Other Father        Deceased, 23  . Stroke Maternal Grandfather 50  . Heart attack Paternal Grandfather        in 61s  . Breast cancer Sister   . Healthy Brother   . Healthy Son   . Healthy Daughter   . Diabetes Neg Hx     BP 116/72 (BP Location: Left Arm, Patient Position: Sitting, Cuff Size: Normal)   Pulse 73   Temp 98.5 F (36.9 C) (Oral)   Wt 193 lb 9.6 oz (87.8 kg)   SpO2 95%   BMI 37.81 kg/m    Review of Systems She denies LOC    Objective:   Physical Exam VITAL SIGNS:  See vs page GENERAL: no distress Pulses: dorsalis pedis intact bilat.   MSK: no deformity of the feet CV: trace bilat leg edema Skin:  no ulcer on the feet, but there are heavy calluses.  normal color and temp on the feet.  Neuro: sensation is intact to touch on the feet, but decreased from normal.  Ext: There is bilateral onychomycosis of the toenails.     Lab Results  Component Value Date   HGBA1C 6.7 04/21/2018     Lab Results  Component Value  Date  CREATININE 0.99 11/22/2017   BUN 17 11/22/2017   NA 137 11/22/2017   K 4.0 11/22/2017   CL 100 11/22/2017   CO2 27 11/22/2017       Assessment & Plan:  Type 2 DM: well-controlled Hypoglycemia: this limits aggressiveness of glycemic control.  Vaginitis: this limits rx options    Patient Instructions  check your blood sugar once a day.  vary the time of day when you check, between before the 3 meals, and at bedtime.  also check if you have symptoms of your blood sugar being too high or too low.  please keep a record of the readings and bring it to your next appointment here (or you can bring the meter itself).  You can write it on any piece of paper.  please call us sooner if your blood sugar goes below 70, or if you have a lot of readings over 200.  Please continue the same medications.  However, if you are going to be active, skip the nateglinide.   Please come back for a follow-up appointment in 4 months.

## 2018-04-22 ENCOUNTER — Encounter: Payer: Self-pay | Admitting: Internal Medicine

## 2018-04-22 MED ORDER — GABAPENTIN 600 MG PO TABS: 1200.0000 mg | ORAL_TABLET | Freq: Every day | ORAL | 3 refills | Status: DC

## 2018-04-22 NOTE — Telephone Encounter (Signed)
Pending - not sure where she wants it sent 

## 2018-04-22 NOTE — Telephone Encounter (Signed)
I think she is currently taking 900 mg at night - does she want to take 300 mg in the morning and 900 mg at night or 1200 mg at night?

## 2018-04-22 NOTE — Telephone Encounter (Signed)
Please advise 

## 2018-04-22 NOTE — Telephone Encounter (Signed)
Rx sent pt is aware 

## 2018-04-22 NOTE — Telephone Encounter (Signed)
Wants to increase the 900 mg at night to 1200. Please advise.

## 2018-05-09 NOTE — Progress Notes (Signed)
Subjective:   Heather [name] is a 67 y.o. female who presents for Medicare Annual (Subsequent) preventive examination.  Review of Systems:  No ROS.  Medicare Wellness Visit. Additional risk factors are reflected in the social history.  Cardiac Risk Factors include: advanced age (>63mn, >>35women);diabetes mellitus;dyslipidemia;hypertension Sleep patterns: gets up 1 times nightly to void and sleeps 6-7 hours nightly.    Home Safety/Smoke Alarms: Feels safe in home. Smoke alarms in place.  Living environment; residence and Firearm Safety: 1-story house/ trailer, no firearms. Lives with family, no needs for DME, good support system Seat Belt Safety/Bike Helmet: Wears seat belt.     Objective:     Vitals: BP (!) 142/86   Pulse 78   Resp 18   Ht 5' (1.524 m)   Wt 195 lb (88.5 kg)   SpO2 99%   BMI 38.08 kg/m   Body mass index is 38.08 kg/m.  Advanced Directives 05/16/2018 02/16/2017 11/28/2015 08/19/2015 08/19/2015 08/13/2015  Does Patient Have a Medical Advance Directive? No No Yes Yes Yes Yes  Type of Advance Directive - - -Public librarianLiving will [place]Living will [place] will  Does patient want to make changes to medical advance directive? - - - No - Patient declined No - Patient declined -  Copy of [place] Chart? - - - Yes Yes Yes  Would patient like information on creating a medical advance directive? Yes (Inpatient - patient requests chaplain consult to create a medical advance directive) Yes (ED - Information included in AVS) - - - -    Tobacco Social History   Tobacco Use  Smoking Status Former Smoker  . Last attempt to quit: 05/18/1984  . Years since quitting: 34.0  Smokeless Tobacco Never Used  Tobacco Comment   smoked 1972-1986, up to 1 ppd     Counseling given: Not Answered Comment: smoked 1972-1986, up to 1 ppd  Past Medical History:  Diagnosis Date  . Anemia   .  Colon polyps   . [place]   . Environmental allergies   . GERD (gastroesophageal reflux disease)   . Heart murmur   . History of DVT (deep vein thrombosis) 1970'S  . History of hiatal hernia   . History of transfusion   . Hyperlipidemia   . Hypertension   . Interstitial cystitis    Dr NJanice Norrie . Shortness of breath dyspnea    "due to low hemaglobin"   Past Surgical History:  Procedure Laterality Date  . APPENDECTOMY    . BILATERAL SALPINGOOPHORECTOMY     painful cysts  . COLONOSCOPY  2005   negative; Dr MEarlean Shawl F/U declined  . CYSTOSCOPY     X 4; Dr NJanice Norrie . LAPAROSCOPIC RIGHT COLECTOMY Right 08/19/2015   Procedure: LAPAROSCOPIC ASSISTED  RIGHT COLECTOMY;  Surgeon: DAlphonsa Overall MD;  Location: [facility];  Service: General;  Laterality: Right;  . PREMAGLINANT POLYPS    . TOTAL ABDOMINAL HYSTERECTOMY     metromenorrhagia   Family History  Problem Relation Age of Onset  . Heart attack Mother        in 736s . Hypertension Mother   . Stroke Mother         in 724s . Other Father        Deceased, 859 . Stroke Maternal Grandfather 50  . Heart attack Paternal Grandfather        in  36s  . Breast cancer Sister   . Healthy Brother   . Healthy Son   . Healthy Daughter   . Diabetes Neg Hx    Social History   Socioeconomic History  . Marital status: Widowed    Spouse name: Not on file  . Number of children: 2  . Years of education: Not on file  . Highest education level: Not on file  Occupational History  . Not on file  Social Needs  . Financial resource strain: Not hard at all  . Food insecurity:    Worry: Never true    Inability: Never true  . Transportation needs:    Medical: No    Non-medical: No  Tobacco Use  . Smoking status: Former Smoker    Last attempt to quit: 05/18/1984    Years since quitting: 34.0  . Smokeless tobacco: Never Used  . Tobacco comment: smoked 1972-1986, up to 1 ppd  Substance and Sexual Activity  . Alcohol  use: No    Alcohol/week: 0.0 standard drinks  . Drug use: No  . Sexual activity: Never  Lifestyle  . Physical activity:    Days per week: 0 days    Minutes per session: 0 min  . Stress: To some extent  Relationships  . Social connections:    Talks on phone: More than three times a week    Gets together: More than three times a week    Attends religious service: 1 to 4 times per year    Active member of club or organization: Yes    Attends meetings of clubs or organizations: More than 4 times per year    Relationship status: Widowed  Other Topics Concern  . Not on file  Social History Narrative   Lives with brother, daughter and 2 grandchildren in a 3 story home.  Has 2 children.  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     Highest level of education:  2 years of graduate school    Outpatient Encounter Medications as of 05/16/2018  Medication Sig  . atorvastatin (LIPITOR) 10 MG tablet Take 1 tablet (10 mg total) by mouth daily.  . blood glucose meter kit and supplies KIT One touch glucometer. Use up to four times daily as directed. (FOR E11.9).  . Dulaglutide (TRULICITY) 1.5 AL/9.3XT SOPN Inject 1.5 mg into the skin once a week.  . gabapentin (NEURONTIN) 600 MG tablet Take 2 tablets (1,200 mg total) by mouth at bedtime.  Marland Kitchen glucose blood (ONE TOUCH ULTRA TEST) test strip 1 each by Other route daily. And lancets 1/day  . hydrochlorothiazide (HYDRODIURIL) 25 MG tablet Take 1 tablet (25 mg total) by mouth daily.  Marland Kitchen losartan (COZAAR) 100 MG tablet Take 1 tablet (100 mg total) by mouth daily.  . multivitamin-lutein (OCUVITE-LUTEIN) CAPS capsule Take 1 capsule by mouth daily.  . nateglinide (STARLIX) 120 MG tablet Take 1 tablet (120 mg total) by mouth 3 (three) times daily with meals.  . Simethicone (GAS-X PO) Take 1 tablet by mouth daily as needed (gas.).   Marland Kitchen tiZANidine (ZANAFLEX) 2 MG tablet TAKE 1 TABLET (2 MG TOTAL) BY MOUTH AT BEDTIME  AS NEEDED FOR MUSCLE SPASMS.  . [DISCONTINUED] ranitidine (ZANTAC) 150 MG tablet Take 150 mg by mouth daily.    No facility-administered encounter medications on file as of 05/16/2018.     Activities of Daily Living In your present state of health, do you have  any difficulty performing the following activities: 05/16/2018 04/21/2018  Hearing? N N  Vision? N N  Difficulty concentrating or making decisions? N N  Walking or climbing stairs? N N  Dressing or bathing? N N  Doing errands, shopping? N N  Preparing Food and eating ? N -  Using the Toilet? N -  In the past six months, have you accidently leaked urine? N -  Do you have problems with loss of bowel control? N -  Managing your Medications? N -  Managing your Finances? N -  Housekeeping or managing your Housekeeping? N -  Some recent data might be hidden    Patient Care Team: Binnie Rail, MD as PCP - General (Internal Medicine) Richmond Campbell, MD as Consulting Physician (Gastroenterology) Renato Shin, MD as Consulting Physician (Endocrinology)    Assessment:   This is a routine wellness examination for Heather [name]. Physical assessment deferred to PCP.  Exercise Activities and Dietary recommendations Current Exercise Habits: The patient does not participate in regular exercise at present, Exercise limited by: orthopedic condition(s) Diet (meal preparation, eat out, water intake, caffeinated beverages, dairy products, fruits and vegetables): in general, a "healthy" diet  , well balanced   Reviewed heart healthy and diabetic diet. Encouraged patient to increase daily water and healthy fluid intake.  Discussed supplementing with glucerna, samples and coupons provided.  Goals    . Patient Stated     Take time just for me and go through old photographs, food at my antiques, go shopping at the book. Start to go to the senior center and increase the amount of socialization I do.    . Stay as  healthy as independent as possible      I want to start Silver Sneakers and do activities at the [facility].        Fall Risk Fall Risk  05/16/2018 02/16/2017 02/02/2017 11/28/2015 11/27/2015  Falls in the past year? 0 Yes No Yes Yes  Number falls in past yr: - 1 - - 2 or more  Injury with Fall? - No - No No  Risk for fall due to : - - - Impaired balance/gait Impaired balance/gait;Impaired mobility  Follow up - - - - Falls evaluation completed;Education provided;Falls prevention discussed    Depression Screen PHQ 2/9 Scores 05/16/2018 02/16/2017 02/02/2017 11/28/2015  PHQ - 2 Score 2 1 0 0  PHQ- 9 Score 5 1 - -     Cognitive Function MMSE - Mini Mental State Exam 02/16/2017  Orientation to time 5  Orientation to Place 5  Registration 3  Attention/ Calculation 5  Recall 2  Language- name 2 objects 2  Language- repeat 1  Language- follow 3 step command 3  Language- read & follow direction 1  Write a sentence 1  Copy design 1  Total score 29       Ad8 score reviewed for issues:  Issues making decisions: no  Less interest in hobbies / activities: no  Repeats questions, stories (family complaining): no  Trouble using ordinary gadgets (microwave, computer, phone):no  Forgets the month or year: no  Mismanaging finances: no  Remembering appts: no  Daily problems with thinking and/or memory: no Ad8 score is= 0  Immunization History  Administered Date(s) Administered  . Hepatitis A 11/14/2003  . Influenza Whole 01/17/2012  . Influenza, High Dose Seasonal PF 02/20/2014, 01/15/2018  . Influenza-Unspecified 02/24/2013, 02/19/2015, 01/24/2016, 01/07/2017  . Pneumococcal Conjugate-13 03/07/2014  . Pneumococcal Polysaccharide-23 08/02/2015  . Td 06/11/2006  .  Tdap 02/04/2016  . Zoster 03/21/2014  . Zoster Recombinat (Shingrix) 09/08/2016, 03/08/2017   Screening Tests Health Maintenance  Topic Date Due  . OPHTHALMOLOGY EXAM  06/28/2018  . HEMOGLOBIN A1C  10/21/2018  . FOOT EXAM  04/22/2019  .  MAMMOGRAM  10/26/2019  . DEXA SCAN  02/03/2021  . TETANUS/TDAP  02/03/2026  . COLONOSCOPY  08/17/2027  . INFLUENZA VACCINE  Completed  . Hepatitis C Screening  Completed  . PNA vac Low Risk Adult  Completed      Plan:     Continue doing brain stimulating activities (puzzles, reading, adult coloring books, staying active) to keep memory sharp.   Continue to eat heart healthy diet (full of fruits, vegetables, whole grains, lean protein, water--limit salt, fat, and sugar intake) and increase physical activity as tolerated.  I have personally reviewed and noted the following in the patient's chart:   . Medical and social history . Use of alcohol, tobacco or illicit drugs  . Current medications and supplements . Functional ability and status . Nutritional status . Physical activity . Advanced directives . List of other physicians . Vitals . Screenings to include cognitive, depression, and falls . Referrals and appointments  In addition, I have reviewed and discussed with patient certain preventive protocols, quality metrics, and best practice recommendations. A written personalized care plan for preventive services as well as general preventive health recommendations were provided to patient.     Michiel Cowboy, RN  05/16/2018

## 2018-05-16 ENCOUNTER — Ambulatory Visit (INDEPENDENT_AMBULATORY_CARE_PROVIDER_SITE_OTHER): Payer: Medicare Other | Admitting: *Deleted

## 2018-05-16 VITALS — BP 142/86 | HR 78 | Resp 18 | Ht 60.0 in | Wt 195.0 lb

## 2018-05-16 DIAGNOSIS — Z0000 Encounter for general adult medical examination without abnormal findings: Secondary | ICD-10-CM | POA: Diagnosis not present

## 2018-05-16 NOTE — Progress Notes (Signed)
Medical screening examination/treatment/procedure(s) were performed by non-physician practitioner and as supervising physician I was immediately available for consultation/collaboration. I agree with above. Brynnlie Unterreiner A Rockland Kotarski, MD 

## 2018-05-16 NOTE — Patient Instructions (Addendum)
Continue doing brain stimulating activities (puzzles, reading, adult coloring books, staying active) to keep memory sharp.   Continue to eat heart healthy diet (full of fruits, vegetables, whole grains, lean protein, water--limit salt, fat, and sugar intake) and increase physical activity as tolerated.   [name] , Thank you for taking time to come for your Medicare Wellness Visit. I appreciate your ongoing commitment to your health goals. Please review the following plan we discussed and let me know if I can assist you in the future.   These are the goals we discussed: Goals    . Patient Stated     Take time just for me and go through old photographs, food at my antiques, go shopping at the book. Start to go to the senior center and increase the amount of socialization I do.    . Stay as  healthy as independent as possible     I want to start Silver Sneakers and do activities at the [facility].        This is a list of the screening recommended for you and due dates:  Health Maintenance  Topic Date Due  . Eye exam for diabetics  06/28/2018  . Hemoglobin A1C  10/21/2018  . Complete foot exam   04/22/2019  . Mammogram  10/26/2019  . DEXA scan (bone density measurement)  02/03/2021  . Tetanus Vaccine  02/03/2026  . Colon Cancer Screening  08/17/2027  . Flu Shot  Completed  .  Hepatitis C: One time screening is recommended by Center for Disease Control  (CDC) for  adults born from [birth year] through 1965.   Completed  . Pneumonia vaccines  Completed    Health Maintenance, Female Adopting a healthy lifestyle and getting preventive care can go a long way to promote health and wellness. Talk with your health care provider about what schedule of regular examinations is right for you. This is a good chance for you to check in with your provider about disease prevention and staying healthy. In between checkups, there are plenty of things you can do on your own. Experts have done a lot of  research about which lifestyle changes and preventive measures are most likely to keep you healthy. Ask your health care provider for more information. Weight and diet Eat a healthy diet  Be sure to include plenty of vegetables, fruits, low-fat dairy products, and lean protein.  Do not eat a lot of foods high in solid fats, added sugars, or salt.  Get regular exercise. This is one of the most important things you can do for your health. ? Most adults should exercise for at least 150 minutes each week. The exercise should increase your heart rate and make you sweat (moderate-intensity exercise). ? Most adults should also do strengthening exercises at least twice a week. This is in addition to the moderate-intensity exercise. Maintain a healthy weight  Body mass index (BMI) is a measurement that can be used to identify possible weight problems. It estimates body fat based on height and weight. Your health care provider can help determine your BMI and help you achieve or maintain a healthy weight.  For females 5 years of age and older: ? A BMI below 18.5 is considered underweight. ? A BMI of 18.5 to 24.9 is normal. ? A BMI of 25 to 29.9 is considered overweight. ? A BMI of 30 and above is considered obese. Watch levels of cholesterol and blood lipids  You should start having your blood  tested for lipids and cholesterol at 69 years of age, then have this test every 5 years.  You may need to have your cholesterol levels checked more often if: ? Your lipid or cholesterol levels are high. ? You are older than 69 years of age. ? You are at high risk for heart disease. Cancer screening Lung Cancer  Lung cancer screening is recommended for adults 17-43 years old who are at high risk for lung cancer because of a history of smoking.  A yearly low-dose CT scan of the lungs is recommended for people who: ? Currently smoke. ? Have quit within the past 15 years. ? Have at least a 30-pack-year  history of smoking. A pack year is smoking an average of one pack of cigarettes a day for 1 year.  Yearly screening should continue until it has been 15 years since you quit.  Yearly screening should stop if you develop a health problem that would prevent you from having lung cancer treatment. Breast Cancer  Practice breast self-awareness. This means understanding how your breasts normally appear and feel.  It also means doing regular breast self-exams. Let your health care provider know about any changes, no matter how small.  If you are in your 20s or 30s, you should have a clinical breast exam (CBE) by a health care provider every 1-3 years as part of a regular health exam.  If you are 27 or older, have a CBE every year. Also consider having a breast X-ray (mammogram) every year.  If you have a family history of breast cancer, talk to your health care provider about genetic screening.  If you are at high risk for breast cancer, talk to your health care provider about having an MRI and a mammogram every year.  Breast cancer gene (BRCA) assessment is recommended for women who have family members with BRCA-related cancers. BRCA-related cancers include: ? Breast. ? Ovarian. ? Tubal. ? Peritoneal cancers.  Results of the assessment will determine the need for genetic counseling and BRCA1 and BRCA2 testing. Cervical Cancer Your health care provider may recommend that you be screened regularly for cancer of the pelvic organs (ovaries, uterus, and vagina). This screening involves a pelvic examination, including checking for microscopic changes to the surface of your cervix (Pap test). You may be encouraged to have this screening done every 3 years, beginning at age 22.  For women ages 41-65, health care providers may recommend pelvic exams and Pap testing every 3 years, or they may recommend the Pap and pelvic exam, combined with testing for human papilloma virus (HPV), every 5 years. Some  types of HPV increase your risk of cervical cancer. Testing for HPV may also be done on women of any age with unclear Pap test results.  Other health care providers may not recommend any screening for nonpregnant women who are considered low risk for pelvic cancer and who do not have symptoms. Ask your health care provider if a screening pelvic exam is right for you.  If you have had past treatment for cervical cancer or a condition that could lead to cancer, you need Pap tests and screening for cancer for at least 20 years after your treatment. If Pap tests have been discontinued, your risk factors (such as having a new sexual partner) need to be reassessed to determine if screening should resume. Some women have medical problems that increase the chance of getting cervical cancer. In these cases, your health care provider may recommend more frequent  screening and Pap tests. Colorectal Cancer  This type of cancer can be detected and often prevented.  Routine colorectal cancer screening usually begins at 69 years of age and continues through 69 years of age.  Your health care provider may recommend screening at an earlier age if you have risk factors for colon cancer.  Your health care provider may also recommend using home test kits to check for hidden blood in the stool.  A small camera at the end of a tube can be used to examine your colon directly (sigmoidoscopy or colonoscopy). This is done to check for the earliest forms of colorectal cancer.  Routine screening usually begins at age 34.  Direct examination of the colon should be repeated every 5-10 years through 69 years of age. However, you may need to be screened more often if early forms of precancerous polyps or small growths are found. Skin Cancer  Check your skin from head to toe regularly.  Tell your health care provider about any new moles or changes in moles, especially if there is a change in a mole's shape or color.  Also  tell your health care provider if you have a mole that is larger than the size of a pencil eraser.  Always use sunscreen. Apply sunscreen liberally and repeatedly throughout the day.  Protect yourself by wearing long sleeves, pants, a wide-brimmed hat, and sunglasses whenever you are outside. Heart disease, diabetes, and high blood pressure  High blood pressure causes heart disease and increases the risk of stroke. High blood pressure is more likely to develop in: ? People who have blood pressure in the high end of the normal range (130-139/85-89 mm Hg). ? People who are overweight or obese. ? People who are African American.  If you are 67-24 years of age, have your blood pressure checked every 3-5 years. If you are 16 years of age or older, have your blood pressure checked every year. You should have your blood pressure measured twice-once when you are at a hospital or clinic, and once when you are not at a hospital or clinic. Record the average of the two measurements. To check your blood pressure when you are not at a hospital or clinic, you can use: ? An automated blood pressure machine at a pharmacy. ? A home blood pressure monitor.  If you are between 61 years and 60 years old, ask your health care provider if you should take aspirin to prevent strokes.  Have regular diabetes screenings. This involves taking a blood sample to check your fasting blood sugar level. ? If you are at a normal weight and have a low risk for diabetes, have this test once every three years after 69 years of age. ? If you are overweight and have a high risk for diabetes, consider being tested at a younger age or more often. Preventing infection Hepatitis B  If you have a higher risk for hepatitis B, you should be screened for this virus. You are considered at high risk for hepatitis B if: ? You were born in a country where hepatitis B is common. Ask your health care provider which countries are considered high  risk. ? Your parents were born in a high-risk country, and you have not been immunized against hepatitis B (hepatitis B vaccine). ? You have HIV or AIDS. ? You use needles to inject street drugs. ? You live with someone who has hepatitis B. ? You have had sex with someone who has hepatitis  B. ? You get hemodialysis treatment. ? You take certain medicines for conditions, including cancer, organ transplantation, and autoimmune conditions. Hepatitis C  Blood testing is recommended for: ? Everyone born from [birth year] through 1965. ? Anyone with known risk factors for hepatitis C. Sexually transmitted infections (STIs)  You should be screened for sexually transmitted infections (STIs) including gonorrhea and chlamydia if: ? You are sexually active and are younger than 69 years of age. ? You are older than 69 years of age and your health care provider tells you that you are at risk for this type of infection. ? Your sexual activity has changed since you were last screened and you are at an increased risk for chlamydia or gonorrhea. Ask your health care provider if you are at risk.  If you do not have HIV, but are at risk, it may be recommended that you take a prescription medicine daily to prevent HIV infection. This is called pre-exposure prophylaxis (PrEP). You are considered at risk if: ? You are sexually active and do not regularly use condoms or know the HIV status of your partner(s). ? You take drugs by injection. ? You are sexually active with a partner who has HIV. Talk with your health care provider about whether you are at high risk of being infected with HIV. If you choose to begin PrEP, you should first be tested for HIV. You should then be tested every 3 months for as long as you are taking PrEP. Pregnancy  If you are premenopausal and you may become pregnant, ask your health care provider about preconception counseling.  If you may become pregnant, take 400 to 800 micrograms (mcg) of  folic acid every day.  If you want to prevent pregnancy, talk to your health care provider about birth control (contraception). Osteoporosis and menopause  Osteoporosis is a disease in which the bones lose minerals and strength with aging. This can result in serious bone fractures. Your risk for osteoporosis can be identified using a bone density scan.  If you are 58 years of age or older, or if you are at risk for osteoporosis and fractures, ask your health care provider if you should be screened.  Ask your health care provider whether you should take a calcium or vitamin D supplement to lower your risk for osteoporosis.  Menopause may have certain physical symptoms and risks.  Hormone replacement therapy may reduce some of these symptoms and risks. Talk to your health care provider about whether hormone replacement therapy is right for you. Follow these instructions at home:  Schedule regular health, dental, and eye exams.  Stay current with your immunizations.  Do not use any tobacco products including cigarettes, chewing tobacco, or electronic cigarettes.  If you are pregnant, do not drink alcohol.  If you are breastfeeding, limit how much and how often you drink alcohol.  Limit alcohol intake to no more than 1 drink per day for nonpregnant women. One drink equals 12 ounces of beer, 5 ounces of Zadyn Yardley, or 1 ounces of hard liquor.  Do not use street drugs.  Do not share needles.  Ask your health care provider for help if you need support or information about quitting drugs.  Tell your health care provider if you often feel depressed.  Tell your health care provider if you have ever been abused or do not feel safe at home. This information is not intended to replace advice given to you by your health care provider. Make sure you discuss  any questions you have with your health care provider. Document Released: 11/17/2010 Document Revised: 10/10/2015 Document Reviewed:  02/05/2015 Elsevier Interactive Patient Education  2019 [employer].

## 2018-05-24 NOTE — Progress Notes (Signed)
Subjective:    Patient ID: Heather [name], female    DOB: Sep 26, 1950, 68 y.o.   MRN: [id number]  HPI The patient is here for follow up.  Hypertension: She is taking her medication daily. She is compliant with a low sodium diet.  She denies chest pain, palpitations, edema, shortness of breath and regular headaches. She is not exercising regularly.  She does not monitor her blood pressure at home.    Hyperlipidemia: She is taking her medication daily. She is compliant with a low fat/cholesterol diet. She is not exercising regularly. She denies myalgias.   Diabetes: She is taking her medication daily as prescribed. She is compliant with a diabetic diet. She is not exercising regularly. She monitors her sugars and they have been running 140's. She checks her feet daily and denies foot lesions. She is up-to-date with an ophthalmology examination.   Diabetic neuropathy: She takes gabapentin and tizanidine daily.  Her feet do hurt at night and feel like wood.  She feels the medications are controlling her pain.    Obese; she is not currently exercising.  She is compliant with a diabetic diet.    Medications and allergies reviewed with patient and updated if appropriate.  Patient Active Problem List   Diagnosis Date Noted  . Cataract 11/22/2017  . Dry cough 11/22/2017  . Nocturnal leg cramps 02/02/2017  . DOE (dyspnea on exertion) 08/04/2016  . Obese 08/04/2016  . Generalized osteoarthritis of hand 02/04/2016  . Diabetic polyneuropathy associated with diabetes mellitus due to underlying condition ([place]) 11/27/2015  . Numbness and tingling of leg 10/09/2015  . Sessile colonic polyp 08/19/2015  . Hiatal hernia, large 08/02/2015  . Colon polyps 08/02/2015  . Anemia, unspecified 04/21/2015  . Diabetes type 2, controlled ([place]) 05/03/2014  . Hyperlipidemia, mixed 03/18/2014  . Interstitial cystitis 07/08/2012  . Essential hypertension 02/10/2011    Current Outpatient Medications on File  Prior to Visit  Medication Sig Dispense Refill  . atorvastatin (LIPITOR) 10 MG tablet Take 1 tablet (10 mg total) by mouth daily. 90 tablet 1  . blood glucose meter kit and supplies KIT One touch glucometer. Use up to four times daily as directed. (FOR E11.9). 1 each 0  . Dulaglutide (TRULICITY) 1.5 LT/9.0ZE SOPN Inject 1.5 mg into the skin once a week. 12 pen 5  . Famotidine (PEPCID PO)     . gabapentin (NEURONTIN) 600 MG tablet Take 2 tablets (1,200 mg total) by mouth at bedtime. 60 tablet 3  . glucose blood (ONE TOUCH ULTRA TEST) test strip 1 each by Other route daily. And lancets 1/day 90 each 3  . hydrochlorothiazide (HYDRODIURIL) 25 MG tablet Take 1 tablet (25 mg total) by mouth daily. 90 tablet 3  . losartan (COZAAR) 100 MG tablet Take 1 tablet (100 mg total) by mouth daily. 90 tablet 1  . multivitamin-lutein (OCUVITE-LUTEIN) CAPS capsule Take 1 capsule by mouth daily.    . nateglinide (STARLIX) 120 MG tablet Take 1 tablet (120 mg total) by mouth 3 (three) times daily with meals. 360 tablet 11  . Simethicone (GAS-X PO) Take 1 tablet by mouth daily as needed (gas.).     Marland Kitchen tiZANidine (ZANAFLEX) 2 MG tablet TAKE 1 TABLET (2 MG TOTAL) BY MOUTH AT BEDTIME AS NEEDED FOR MUSCLE SPASMS. 90 tablet 3   No current facility-administered medications on file prior to visit.     Past Medical History:  Diagnosis Date  . Anemia   . Colon polyps   .  Diabetes mellitus without complication ([place])   . Environmental allergies   . GERD (gastroesophageal reflux disease)   . Heart murmur   . History of DVT (deep vein thrombosis) 1970'S  . History of hiatal hernia   . History of transfusion   . Hyperlipidemia   . Hypertension   . Interstitial cystitis    Dr Janice Norrie  . Shortness of breath dyspnea    "due to low hemaglobin"    Past Surgical History:  Procedure Laterality Date  . APPENDECTOMY    . BILATERAL SALPINGOOPHORECTOMY     painful cysts  . COLONOSCOPY  2005   negative; Dr Earlean Shawl. F/U  declined  . CYSTOSCOPY     X 4; Dr Janice Norrie  . LAPAROSCOPIC RIGHT COLECTOMY Right 08/19/2015   Procedure: LAPAROSCOPIC ASSISTED  RIGHT COLECTOMY;  Surgeon: Alphonsa Overall, MD;  Location: [facility];  Service: General;  Laterality: Right;  . PREMAGLINANT POLYPS    . TOTAL ABDOMINAL HYSTERECTOMY     metromenorrhagia    Social History   Socioeconomic History  . Marital status: Widowed    Spouse name: Not on file  . Number of children: 2  . Years of education: Not on file  . Highest education level: Not on file  Occupational History  . Not on file  Social Needs  . Financial resource strain: Not hard at all  . Food insecurity:    Worry: Never true    Inability: Never true  . Transportation needs:    Medical: No    Non-medical: No  Tobacco Use  . Smoking status: Former Smoker    Last attempt to quit: 05/18/1984    Years since quitting: 34.0  . Smokeless tobacco: Never Used  . Tobacco comment: smoked 1972-1986, up to 1 ppd  Substance and Sexual Activity  . Alcohol use: No    Alcohol/week: 0.0 standard drinks  . Drug use: No  . Sexual activity: Never  Lifestyle  . Physical activity:    Days per week: 0 days    Minutes per session: 0 min  . Stress: To some extent  Relationships  . Social connections:    Talks on phone: More than three times a week    Gets together: More than three times a week    Attends religious service: 1 to 4 times per year    Active member of club or organization: Yes    Attends meetings of clubs or organizations: More than 4 times per year    Relationship status: Widowed  Other Topics Concern  . Not on file  Social History Narrative   Lives with brother, daughter and 2 grandchildren in a 3 story home.  Has 2 children.  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     Highest level of education:  2 years of graduate school    Family History  Problem Relation Age of Onset  . Heart attack Mother        in 106s  .  Hypertension Mother   . Stroke Mother         in 59s  . Other Father        Deceased, 85  . Stroke Maternal Grandfather 50  . Heart attack Paternal Grandfather        in 66s  . Breast cancer Sister   . Healthy Brother   . Healthy Son   . Healthy Daughter   . Diabetes Neg Hx  Review of Systems  Constitutional: Negative for chills and fever.  Respiratory: Negative for cough, shortness of breath and wheezing.   Cardiovascular: Negative for chest pain, palpitations and leg swelling.  Neurological: Positive for headaches (occ). Negative for light-headedness.       Objective:   Vitals:   05/25/18 0748  BP: 120/72  Pulse: 65  Resp: 16  Temp: 98.6 F (37 C)  SpO2: 98%   BP Readings from Last 3 Encounters:  05/25/18 120/72  05/16/18 (!) 142/86  04/21/18 116/72   Wt Readings from Last 3 Encounters:  05/25/18 195 lb (88.5 kg)  05/16/18 195 lb (88.5 kg)  04/21/18 193 lb 9.6 oz (87.8 kg)   Body mass index is 38.08 kg/m.   Physical Exam    Constitutional: Appears well-developed and well-nourished. No distress.  HENT:  Head: Normocephalic and atraumatic.  Neck: Neck supple. No tracheal deviation present. No thyromegaly present.  No cervical lymphadenopathy Cardiovascular: Normal rate, regular rhythm and normal heart sounds.   2/6 systolic murmur heard. No carotid bruit .  No edema Pulmonary/Chest: Effort normal and breath sounds normal. No respiratory distress. No has no wheezes. No rales.  Skin: Skin is warm and dry. Not diaphoretic.  Psychiatric: Normal mood and affect. Behavior is normal.      Assessment & Plan:    See Problem List for Assessment and Plan of chronic medical problems.

## 2018-05-25 ENCOUNTER — Other Ambulatory Visit: Payer: Self-pay

## 2018-05-25 ENCOUNTER — Encounter: Payer: Self-pay | Admitting: Internal Medicine

## 2018-05-25 ENCOUNTER — Other Ambulatory Visit (INDEPENDENT_AMBULATORY_CARE_PROVIDER_SITE_OTHER): Payer: Medicare Other

## 2018-05-25 ENCOUNTER — Ambulatory Visit (INDEPENDENT_AMBULATORY_CARE_PROVIDER_SITE_OTHER): Payer: Medicare Other | Admitting: Internal Medicine

## 2018-05-25 VITALS — BP 120/72 | HR 65 | Temp 98.6°F | Resp 16 | Ht 60.0 in | Wt 195.0 lb

## 2018-05-25 DIAGNOSIS — I10 Essential (primary) hypertension: Secondary | ICD-10-CM

## 2018-05-25 DIAGNOSIS — E1142 Type 2 diabetes mellitus with diabetic polyneuropathy: Secondary | ICD-10-CM | POA: Diagnosis not present

## 2018-05-25 DIAGNOSIS — L989 Disorder of the skin and subcutaneous tissue, unspecified: Secondary | ICD-10-CM

## 2018-05-25 DIAGNOSIS — E0842 Diabetes mellitus due to underlying condition with diabetic polyneuropathy: Secondary | ICD-10-CM

## 2018-05-25 DIAGNOSIS — E782 Mixed hyperlipidemia: Secondary | ICD-10-CM

## 2018-05-25 DIAGNOSIS — Z6835 Body mass index (BMI) 35.0-35.9, adult: Secondary | ICD-10-CM

## 2018-05-25 LAB — LIPID PANEL
CHOL/HDL RATIO: 3
Cholesterol: 182 mg/dL (ref 0–200)
HDL: 58.3 mg/dL (ref >39.00)
LDL Cholesterol: 92 mg/dL (ref 0–99)
NonHDL: 124.16
Triglycerides: 159 mg/dL — ABNORMAL HIGH (ref 0.0–149.0)
VLDL: 31.8 mg/dL (ref 0.0–40.0)

## 2018-05-25 LAB — COMPREHENSIVE METABOLIC PANEL
ALT: 16 U/L (ref 0–35)
AST: 15 U/L (ref 0–37)
Albumin: 4.3 g/dL (ref 3.5–5.2)
Alkaline Phosphatase: 57 U/L (ref 39–117)
BUN: 20 mg/dL (ref 6–23)
CO2: 28 meq/L (ref 19–32)
Calcium: 10.2 mg/dL (ref 8.4–10.5)
Chloride: 98 mEq/L (ref 96–112)
Creatinine, Ser: 0.94 mg/dL (ref 0.40–1.20)
GFR: 62.64 mL/min (ref >60.00)
Glucose, Bld: 131 mg/dL — ABNORMAL HIGH (ref 70–99)
Potassium: 3.9 mEq/L (ref 3.5–5.1)
Sodium: 135 mEq/L (ref 135–145)
Total Bilirubin: 0.9 mg/dL (ref 0.2–1.2)
Total Protein: 7.2 g/dL (ref 6.0–8.3)

## 2018-05-25 MED ORDER — HYDROCHLOROTHIAZIDE 25 MG PO TABS: 25.0000 mg | ORAL_TABLET | Freq: Every day | ORAL | 3 refills | Status: DC

## 2018-05-25 MED ORDER — GLUCOSE BLOOD VI STRP: 1.0000 | ORAL_STRIP | Freq: Every day | 3 refills | Status: DC

## 2018-05-25 MED ORDER — ATORVASTATIN CALCIUM 10 MG PO TABS: 10.0000 mg | ORAL_TABLET | Freq: Every day | ORAL | 1 refills | Status: DC

## 2018-05-25 MED ORDER — LOSARTAN POTASSIUM 100 MG PO TABS: 100.0000 mg | ORAL_TABLET | Freq: Every day | ORAL | 1 refills | Status: DC

## 2018-05-25 MED ORDER — NATEGLINIDE 120 MG PO TABS: 120.0000 mg | ORAL_TABLET | Freq: Three times a day (TID) | ORAL | 11 refills | Status: DC

## 2018-05-25 MED ORDER — ONETOUCH LANCETS MISC: 1.0000 | Freq: Every day | 3 refills | Status: DC

## 2018-05-25 NOTE — Assessment & Plan Note (Signed)
With diabetes, high cholesterol and htn Start exercise Decrease portions Low sugar/carb diet F/u in 6 months

## 2018-05-25 NOTE — Patient Instructions (Addendum)
  Tests ordered today. Your results will be released to [place] (or called to you) after review, usually within 72hours after test completion. If any changes need to be made, you will be notified at that same time.   Medications reviewed and updated.  Changes include :   none  Your prescription(s) have been submitted to your pharmacy. Please take as directed and contact our office if you believe you are having problem(s) with the medication(s).  A referral was ordered for dermatology  Please followup in 6 months

## 2018-05-25 NOTE — Assessment & Plan Note (Signed)
Management per Dr Loanne Drilling  Lab Results  Component Value Date   HGBA1C 6.7 04/21/2018   Encouraged regular exercise ideally work on weight loss

## 2018-05-25 NOTE — Assessment & Plan Note (Addendum)
Taking gabapentin and tizanidine Fairly controlled Continue both medications

## 2018-05-25 NOTE — Assessment & Plan Note (Signed)
BP well controlled Current regimen effective and well tolerated Continue current medications at current doses cmp  

## 2018-05-25 NOTE — Assessment & Plan Note (Signed)
Check lipid panel  Continue daily statin Regular exercise and healthy diet encouraged  

## 2018-05-26 ENCOUNTER — Encounter: Payer: Self-pay | Admitting: Internal Medicine

## 2018-05-27 ENCOUNTER — Telehealth: Payer: Self-pay | Admitting: Endocrinology

## 2018-05-27 ENCOUNTER — Telehealth: Payer: Self-pay | Admitting: Internal Medicine

## 2018-05-27 DIAGNOSIS — I10 Essential (primary) hypertension: Secondary | ICD-10-CM

## 2018-05-27 MED ORDER — GABAPENTIN 600 MG PO TABS: 1200.0000 mg | ORAL_TABLET | Freq: Every day | ORAL | 1 refills | Status: DC

## 2018-05-27 MED ORDER — HYDROCHLOROTHIAZIDE 25 MG PO TABS: 25.0000 mg | ORAL_TABLET | Freq: Every day | ORAL | 1 refills | Status: DC

## 2018-05-27 MED ORDER — ATORVASTATIN CALCIUM 10 MG PO TABS: 10.0000 mg | ORAL_TABLET | Freq: Every day | ORAL | 1 refills | Status: DC

## 2018-05-27 MED ORDER — LOSARTAN POTASSIUM 100 MG PO TABS: 100.0000 mg | ORAL_TABLET | Freq: Every day | ORAL | 1 refills | Status: DC

## 2018-05-27 NOTE — Telephone Encounter (Signed)
[facility] to [facility], but pt gets Trulicity from Dr. Loanne Drilling...Heather [name]

## 2018-05-27 NOTE — Telephone Encounter (Signed)
Error

## 2018-05-27 NOTE — Telephone Encounter (Signed)
Copied from [place] [id number]. Topic: Quick Communication - Rx Refill/Question >> May 27, 2018  1:36 PM Yvette Rack wrote: Medication: 90 day refill on all medicines   atorvastatin (LIPITOR) 10 MG tablet     Dulaglutide (TRULICITY) 1.5 ME/2.6ST SOPN   gabapentin (NEURONTIN) 600 MG tablet   hydrochlorothiazide (HYDRODIURIL) 25 MG tablet   losartan (COZAAR) 100 MG tablet    tiZANidine (ZANAFLEX) 2 MG tablet     Has the patient contacted their pharmacy? Yes.   (Agent: If no, request that the patient contact the pharmacy for the refill.) (Agent: If yes, when and what did the pharmacy advise?) called yesterday to update pharmacy with provider  Preferred Pharmacy (with phone number or street name): [facility], [place] [phone] (Phone) [phone] (Fax)    Agent: Please be advised that RX refills may take up to 3 business days. We ask that you follow-up with your pharmacy.

## 2018-06-02 ENCOUNTER — Encounter: Payer: Self-pay | Admitting: Internal Medicine

## 2018-06-21 ENCOUNTER — Other Ambulatory Visit: Payer: Self-pay

## 2018-06-21 ENCOUNTER — Encounter: Payer: Self-pay | Admitting: Endocrinology

## 2018-06-21 DIAGNOSIS — E1142 Type 2 diabetes mellitus with diabetic polyneuropathy: Secondary | ICD-10-CM

## 2018-06-21 MED ORDER — GLUCOSE BLOOD VI STRP: 1.0000 | ORAL_STRIP | Freq: Every day | 1 refills | Status: DC

## 2018-06-21 MED ORDER — ONETOUCH ULTRA 2 W/DEVICE KIT: 1.0000 | PACK | Freq: Four times a day (QID) | 0 refills | Status: DC

## 2018-06-21 MED ORDER — ONETOUCH ULTRASOFT LANCETS MISC: 12 refills | Status: DC

## 2018-07-01 DIAGNOSIS — H25043 Posterior subcapsular polar age-related cataract, bilateral: Secondary | ICD-10-CM | POA: Diagnosis not present

## 2018-07-01 DIAGNOSIS — E119 Type 2 diabetes mellitus without complications: Secondary | ICD-10-CM | POA: Diagnosis not present

## 2018-07-01 DIAGNOSIS — H2513 Age-related nuclear cataract, bilateral: Secondary | ICD-10-CM | POA: Diagnosis not present

## 2018-07-01 LAB — HM DIABETES EYE EXAM

## 2018-07-11 ENCOUNTER — Other Ambulatory Visit: Payer: Self-pay

## 2018-07-11 DIAGNOSIS — D229 Melanocytic nevi, unspecified: Secondary | ICD-10-CM | POA: Diagnosis not present

## 2018-07-11 DIAGNOSIS — D485 Neoplasm of uncertain behavior of skin: Secondary | ICD-10-CM | POA: Diagnosis not present

## 2018-07-11 DIAGNOSIS — L738 Other specified follicular disorders: Secondary | ICD-10-CM | POA: Diagnosis not present

## 2018-07-11 DIAGNOSIS — L820 Inflamed seborrheic keratosis: Secondary | ICD-10-CM | POA: Diagnosis not present

## 2018-07-14 ENCOUNTER — Other Ambulatory Visit: Payer: Self-pay | Admitting: Internal Medicine

## 2018-08-31 ENCOUNTER — Ambulatory Visit: Payer: Medicare Other | Admitting: Endocrinology

## 2018-09-16 ENCOUNTER — Ambulatory Visit (INDEPENDENT_AMBULATORY_CARE_PROVIDER_SITE_OTHER): Payer: Medicare Other | Admitting: Endocrinology

## 2018-09-16 ENCOUNTER — Other Ambulatory Visit: Payer: Self-pay

## 2018-09-16 DIAGNOSIS — E1142 Type 2 diabetes mellitus with diabetic polyneuropathy: Secondary | ICD-10-CM | POA: Diagnosis not present

## 2018-09-16 MED ORDER — REPAGLINIDE 2 MG PO TABS: 2.0000 mg | ORAL_TABLET | Freq: Three times a day (TID) | ORAL | 11 refills | Status: DC

## 2018-09-16 NOTE — Progress Notes (Signed)
Subjective:    Patient ID: Heather [name], female    DOB: May 26, 1950, 68 y.o.   MRN: [id number]  HPI  telehealth visit today via telephone x 10 minutes Alternatives to telehealth are presented to this patient, and the patient agrees to the telehealth visit. Pt is advised of the cost of the visit, and agrees to this, also.   Patient is at home, and I am at the office.   Persons attending the telehealth visit: the patient and I Pt returns for f/u of diabetes mellitus: DM type: 2 Dx'ed: 3419 Complications: polyneuropathy Therapy: trulicity and nareglinide.   GDM: never DKA: never Severe hypoglycemia: never.  Pancreatitis: never Other: she has never been on insulin; short bowel syndrome limits metformin dosage; she did not tolerate [place] or Jardiance (vaginitis); edema precludes pioglitazone.   Interval history: she says varies from 150-220.   pt states she feels well in general, except for leg edema.   Past Medical History:  Diagnosis Date  . Anemia   . Colon polyps   . Diabetes mellitus without complication ([place])   . Environmental allergies   . GERD (gastroesophageal reflux disease)   . Heart murmur   . History of DVT (deep vein thrombosis) 1970'S  . History of hiatal hernia   . History of transfusion   . Hyperlipidemia   . Hypertension   . Interstitial cystitis    Dr Janice Norrie  . Shortness of breath dyspnea    "due to low hemaglobin"    Past Surgical History:  Procedure Laterality Date  . APPENDECTOMY    . BILATERAL SALPINGOOPHORECTOMY     painful cysts  . COLONOSCOPY  2005   negative; Dr Earlean Shawl. F/U declined  . CYSTOSCOPY     X 4; Dr Janice Norrie  . LAPAROSCOPIC RIGHT COLECTOMY Right 08/19/2015   Procedure: LAPAROSCOPIC ASSISTED  RIGHT COLECTOMY;  Surgeon: Alphonsa Overall, MD;  Location: [facility];  Service: General;  Laterality: Right;  . PREMAGLINANT POLYPS    . TOTAL ABDOMINAL HYSTERECTOMY     metromenorrhagia    Social History   Socioeconomic History  . Marital status:  Widowed    Spouse name: Not on file  . Number of children: 2  . Years of education: Not on file  . Highest education level: Not on file  Occupational History  . Not on file  Social Needs  . Financial resource strain: Not hard at all  . Food insecurity:    Worry: Never true    Inability: Never true  . Transportation needs:    Medical: No    Non-medical: No  Tobacco Use  . Smoking status: Former Smoker    Last attempt to quit: 05/18/1984    Years since quitting: 34.3  . Smokeless tobacco: Never Used  . Tobacco comment: smoked 1972-1986, up to 1 ppd  Substance and Sexual Activity  . Alcohol use: No    Alcohol/week: 0.0 standard drinks  . Drug use: No  . Sexual activity: Never  Lifestyle  . Physical activity:    Days per week: 0 days    Minutes per session: 0 min  . Stress: To some extent  Relationships  . Social connections:    Talks on phone: More than three times a week    Gets together: More than three times a week    Attends religious service: 1 to 4 times per year    Active member of club or organization: Yes    Attends meetings of clubs or organizations:  More than 4 times per year    Relationship status: Widowed  . Intimate partner violence:    Fear of current or ex partner: Not on file    Emotionally abused: Not on file    Physically abused: Not on file    Forced sexual activity: Not on file  Other Topics Concern  . Not on file  Social History Narrative   Lives with brother, daughter and 2 grandchildren in a 3 story home.  Has 2 children.  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     Highest level of education:  2 years of graduate school    Current Outpatient Medications on File Prior to Visit  Medication Sig Dispense Refill  . atorvastatin (LIPITOR) 10 MG tablet Take 1 tablet (10 mg total) by mouth daily. 90 tablet 1  . Blood Glucose Monitoring Suppl (ONE TOUCH ULTRA 2) w/Device KIT 1 each by Does not apply  route 4 (four) times daily. Use device to monitor glucose levels 4 times per day; E11.9 1 each 0  . Dulaglutide (TRULICITY) 1.5 ZP/9.1TA SOPN Inject 1.5 mg into the skin once a week. 12 pen 5  . Famotidine (PEPCID PO)     . gabapentin (NEURONTIN) 600 MG tablet Take 2 tablets (1,200 mg total) by mouth at bedtime. 180 tablet 1  . glucose blood (ONE TOUCH ULTRA TEST) test strip 1 each by Other route daily. Use to monitor glucose levels 4 times per day; E11.9 200 each 1  . hydrochlorothiazide (HYDRODIURIL) 25 MG tablet Take 1 tablet (25 mg total) by mouth daily. 90 tablet 1  . Lancets (ONETOUCH ULTRASOFT) lancets Use to monitor glucose levels 4 times per day; E11.9 100 each 12  . losartan (COZAAR) 100 MG tablet Take 1 tablet (100 mg total) by mouth daily. 90 tablet 1  . multivitamin-lutein (OCUVITE-LUTEIN) CAPS capsule Take 1 capsule by mouth daily.    . ONE TOUCH LANCETS MISC 1 Device by Other route daily. Use to test blood sugars once daily 90 each 3  . Simethicone (GAS-X PO) Take 1 tablet by mouth daily as needed (gas.).     Marland Kitchen tiZANidine (ZANAFLEX) 2 MG tablet TAKE 1 TABLET (2 MG TOTAL) BY MOUTH AT BEDTIME AS NEEDED FOR MUSCLE SPASMS. 90 tablet 3   No current facility-administered medications on file prior to visit.     Allergies  Allergen Reactions  . Farxiga [Dapagliflozin]     Constant Vaginal Infections  . Flagyl [Metronidazole] Other (See Comments)    Made really sick. Was able to tolerate dose 4/17 with zofran  . Jardiance [Empagliflozin]     Constant Vaginal infections  . Lisinopril     04/03/15 cough reported X several months  . Sulfonamide Derivatives     nausea    Family History  Problem Relation Age of Onset  . Heart attack Mother        in 31s  . Hypertension Mother   . Stroke Mother         in 67s  . Other Father        Deceased, 67  . Stroke Maternal Grandfather 50  . Heart attack Paternal Grandfather        in 23s  . Breast cancer Sister   . Healthy Brother    . Healthy Son   . Healthy Daughter   . Diabetes Neg Hx     There were no vitals taken for  this visit.   Review of Systems She denies hypoglycemia.     Objective:   Physical Exam      Assessment & Plan:  Type 2 DM: worse: we discussed. she declines A1c for now Edema: This limits rx options  Patient Instructions  check your blood sugar once a day.  vary the time of day when you check, between before the 3 meals, and at bedtime.  also check if you have symptoms of your blood sugar being too high or too low.  please keep a record of the readings and bring it to your next appointment here (or you can bring the meter itself).  You can write it on any piece of paper.  please call us sooner if your blood sugar goes below 70, or if you have a lot of readings over 200.  Please change the nateglinide to repaglinide.   Please come back for a follow-up appointment in 1 month.

## 2018-09-16 NOTE — Patient Instructions (Addendum)
check your blood sugar once a day.  vary the time of day when you check, between before the 3 meals, and at bedtime.  also check if you have symptoms of your blood sugar being too high or too low.  please keep a record of the readings and bring it to your next appointment here (or you can bring the meter itself).  You can write it on any piece of paper.  please call us sooner if your blood sugar goes below 70, or if you have a lot of readings over 200.  Please change the nateglinide to repaglinide.   Please come back for a follow-up appointment in 1 month.

## 2018-10-11 ENCOUNTER — Other Ambulatory Visit: Payer: Self-pay

## 2018-10-11 ENCOUNTER — Ambulatory Visit (INDEPENDENT_AMBULATORY_CARE_PROVIDER_SITE_OTHER): Payer: Medicare Other | Admitting: Internal Medicine

## 2018-10-11 ENCOUNTER — Other Ambulatory Visit (INDEPENDENT_AMBULATORY_CARE_PROVIDER_SITE_OTHER): Payer: Medicare Other

## 2018-10-11 ENCOUNTER — Encounter: Payer: Self-pay | Admitting: Internal Medicine

## 2018-10-11 VITALS — BP 132/70 | HR 87 | Temp 98.5°F | Resp 16 | Ht 60.0 in | Wt 201.8 lb

## 2018-10-11 DIAGNOSIS — E1142 Type 2 diabetes mellitus with diabetic polyneuropathy: Secondary | ICD-10-CM

## 2018-10-11 DIAGNOSIS — R339 Retention of urine, unspecified: Secondary | ICD-10-CM

## 2018-10-11 DIAGNOSIS — N3000 Acute cystitis without hematuria: Secondary | ICD-10-CM

## 2018-10-11 LAB — CBC WITH DIFFERENTIAL/PLATELET
Basophils Absolute: 0 10*3/uL (ref 0.0–0.1)
Basophils Relative: 0.6 % (ref 0.0–3.0)
Eosinophils Absolute: 0.2 10*3/uL (ref 0.0–0.7)
Eosinophils Relative: 2.7 % (ref 0.0–5.0)
HCT: 35.2 % — ABNORMAL LOW (ref 36.0–46.0)
Hemoglobin: 11.9 g/dL — ABNORMAL LOW (ref 12.0–15.0)
Lymphocytes Relative: 28.1 % (ref 12.0–46.0)
Lymphs Abs: 1.7 10*3/uL (ref 0.7–4.0)
MCHC: 33.7 g/dL (ref 30.0–36.0)
MCV: 72.7 fl — ABNORMAL LOW (ref 78.0–100.0)
Monocytes Absolute: 0.5 10*3/uL (ref 0.1–1.0)
Monocytes Relative: 7.5 % (ref 3.0–12.0)
Neutro Abs: 3.8 10*3/uL (ref 1.4–7.7)
Neutrophils Relative %: 61.1 % (ref 43.0–77.0)
Platelets: 344 10*3/uL (ref 150.0–400.0)
RBC: 4.85 Mil/uL (ref 3.87–5.11)
RDW: 15.8 % — ABNORMAL HIGH (ref 11.5–15.5)
WBC: 6.2 10*3/uL (ref 4.0–10.5)

## 2018-10-11 LAB — COMPREHENSIVE METABOLIC PANEL WITH GFR
ALT: 20 U/L (ref 0–35)
AST: 16 U/L (ref 0–37)
Albumin: 4.1 g/dL (ref 3.5–5.2)
Alkaline Phosphatase: 62 U/L (ref 39–117)
BUN: 14 mg/dL (ref 6–23)
CO2: 29 meq/L (ref 19–32)
Calcium: 9.5 mg/dL (ref 8.4–10.5)
Chloride: 100 meq/L (ref 96–112)
Creatinine, Ser: 0.86 mg/dL (ref 0.40–1.20)
GFR: 65.23 mL/min
Glucose, Bld: 115 mg/dL — ABNORMAL HIGH (ref 70–99)
Potassium: 3.9 meq/L (ref 3.5–5.1)
Sodium: 137 meq/L (ref 135–145)
Total Bilirubin: 1.1 mg/dL (ref 0.2–1.2)
Total Protein: 6.9 g/dL (ref 6.0–8.3)

## 2018-10-11 LAB — HEMOGLOBIN A1C: Hgb A1c MFr Bld: 7.7 % — ABNORMAL HIGH (ref 4.6–6.5)

## 2018-10-11 MED ORDER — AMOXICILLIN-POT CLAVULANATE 875-125 MG PO TABS: 1.0000 | ORAL_TABLET | Freq: Two times a day (BID) | ORAL | 0 refills | Status: DC

## 2018-10-11 NOTE — Assessment & Plan Note (Signed)
Sugars more variable at home recently-possibly related to UTI Has an upcoming appointment with Dr. Loanne Drilling She is unsure if this will be virtual or in person Will check an A1c since we are getting blood work today

## 2018-10-11 NOTE — Progress Notes (Signed)
Subjective:    Patient ID: Heather [name], female    DOB: 1951-05-13, 68 y.o.   MRN: [id number]  HPI The patient is here for an acute visit.   UTI:  Her symptoms have been intermittent during last week.  They did get worse over the weekend.  She is having lower abdominal pain, dysuria and she is only urinating a small amount.  She has increased urinary frequency and urgency.  She has had some nausea.  She has some right CVA pain.  She is concerned that she has really increased her fluids, but has not seen any increase in urination.  She feels like she is retaining fluid.  Even before her UTI symptoms started she felt like she was retaining water and had swelling throughout her body.  She did increase her hydrochlorothiazide 25 mg to twice a day because she thought I had said that was okay to do.  She did this for about a week.  She stopped a few days ago because it did not seem to help.  She denies a fever.  She denies hematuria.          She states her sugars have been variable and all over the place.  Medications and allergies reviewed with patient and updated if appropriate.  Patient Active Problem List   Diagnosis Date Noted  . Acute cystitis without hematuria 10/11/2018  . Urinary retention 10/11/2018  . Cataract 11/22/2017  . Dry cough 11/22/2017  . Nocturnal leg cramps 02/02/2017  . DOE (dyspnea on exertion) 08/04/2016  . Obese 08/04/2016  . Generalized osteoarthritis of hand 02/04/2016  . Diabetic polyneuropathy associated with diabetes mellitus due to underlying condition ([place]) 11/27/2015  . Numbness and tingling of leg 10/09/2015  . Hiatal hernia, large 08/02/2015  . Colon polyps 08/02/2015  . Anemia, unspecified 04/21/2015  . Diabetes type 2, controlled ([place]) 05/03/2014  . Hyperlipidemia, mixed 03/18/2014  . Interstitial cystitis 07/08/2012  . Essential hypertension 02/10/2011    Current Outpatient Medications on File Prior to Visit  Medication Sig Dispense  Refill  . atorvastatin (LIPITOR) 10 MG tablet Take 1 tablet (10 mg total) by mouth daily. 90 tablet 1  . Blood Glucose Monitoring Suppl (ONE TOUCH ULTRA 2) w/Device KIT 1 each by Does not apply route 4 (four) times daily. Use device to monitor glucose levels 4 times per day; E11.9 1 each 0  . Dulaglutide (TRULICITY) 1.5 CH/8.5ID SOPN Inject 1.5 mg into the skin once a week. 12 pen 5  . Famotidine (PEPCID PO)     . gabapentin (NEURONTIN) 600 MG tablet Take 2 tablets (1,200 mg total) by mouth at bedtime. 180 tablet 1  . glucose blood (ONE TOUCH ULTRA TEST) test strip 1 each by Other route daily. Use to monitor glucose levels 4 times per day; E11.9 200 each 1  . hydrochlorothiazide (HYDRODIURIL) 25 MG tablet Take 1 tablet (25 mg total) by mouth daily. 90 tablet 1  . Lancets (ONETOUCH ULTRASOFT) lancets Use to monitor glucose levels 4 times per day; E11.9 100 each 12  . losartan (COZAAR) 100 MG tablet Take 1 tablet (100 mg total) by mouth daily. 90 tablet 1  . multivitamin-lutein (OCUVITE-LUTEIN) CAPS capsule Take 1 capsule by mouth daily.    . ONE TOUCH LANCETS MISC 1 Device by Other route daily. Use to test blood sugars once daily 90 each 3  . repaglinide (PRANDIN) 2 MG tablet Take 1 tablet (2 mg total) by mouth 3 (three) times daily  before meals. 90 tablet 11  . Simethicone (GAS-X PO) Take 1 tablet by mouth daily as needed (gas.).     Marland Kitchen tiZANidine (ZANAFLEX) 2 MG tablet TAKE 1 TABLET (2 MG TOTAL) BY MOUTH AT BEDTIME AS NEEDED FOR MUSCLE SPASMS. 90 tablet 3   No current facility-administered medications on file prior to visit.     Past Medical History:  Diagnosis Date  . Anemia   . Colon polyps   . Diabetes mellitus without complication ([place])   . Environmental allergies   . GERD (gastroesophageal reflux disease)   . Heart murmur   . History of DVT (deep vein thrombosis) 1970'S  . History of hiatal hernia   . History of transfusion   . Hyperlipidemia   . Hypertension   . Interstitial  cystitis    Dr Janice Norrie  . Shortness of breath dyspnea    "due to low hemaglobin"    Past Surgical History:  Procedure Laterality Date  . APPENDECTOMY    . BILATERAL SALPINGOOPHORECTOMY     painful cysts  . COLONOSCOPY  2005   negative; Dr Earlean Shawl. F/U declined  . CYSTOSCOPY     X 4; Dr Janice Norrie  . LAPAROSCOPIC RIGHT COLECTOMY Right 08/19/2015   Procedure: LAPAROSCOPIC ASSISTED  RIGHT COLECTOMY;  Surgeon: Alphonsa Overall, MD;  Location: [facility];  Service: General;  Laterality: Right;  . PREMAGLINANT POLYPS    . TOTAL ABDOMINAL HYSTERECTOMY     metromenorrhagia    Social History   Socioeconomic History  . Marital status: Widowed    Spouse name: Not on file  . Number of children: 2  . Years of education: Not on file  . Highest education level: Not on file  Occupational History  . Not on file  Social Needs  . Financial resource strain: Not hard at all  . Food insecurity:    Worry: Never true    Inability: Never true  . Transportation needs:    Medical: No    Non-medical: No  Tobacco Use  . Smoking status: Former Smoker    Last attempt to quit: 05/18/1984    Years since quitting: 34.4  . Smokeless tobacco: Never Used  . Tobacco comment: smoked 1972-1986, up to 1 ppd  Substance and Sexual Activity  . Alcohol use: No    Alcohol/week: 0.0 standard drinks  . Drug use: No  . Sexual activity: Never  Lifestyle  . Physical activity:    Days per week: 0 days    Minutes per session: 0 min  . Stress: To some extent  Relationships  . Social connections:    Talks on phone: More than three times a week    Gets together: More than three times a week    Attends religious service: 1 to 4 times per year    Active member of club or organization: Yes    Attends meetings of clubs or organizations: More than 4 times per year    Relationship status: Widowed  Other Topics Concern  . Not on file  Social History Narrative   Lives with brother, daughter and 2 grandchildren in a 3 story home.  Has  2 children.  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     Highest level of education:  2 years of graduate school    Family History  Problem Relation Age of Onset  . Heart attack Mother        in 57s  .  Hypertension Mother   . Stroke Mother         in 24s  . Other Father        Deceased, 37  . Stroke Maternal Grandfather 50  . Heart attack Paternal Grandfather        in 37s  . Breast cancer Sister   . Healthy Brother   . Healthy Son   . Healthy Daughter   . Diabetes Neg Hx     Review of Systems  Constitutional: Negative for fever.  Gastrointestinal: Positive for abdominal pain (lower abdomen) and nausea.  Genitourinary: Positive for dysuria, flank pain (right cva), frequency and urgency. Negative for hematuria.       Objective:   Vitals:   10/11/18 0838  BP: 132/70  Pulse: 87  Resp: 16  Temp: 98.5 F (36.9 C)  SpO2: 98%   BP Readings from Last 3 Encounters:  10/11/18 132/70  05/25/18 120/72  05/16/18 (!) 142/86   Wt Readings from Last 3 Encounters:  10/11/18 201 lb 12.8 oz (91.5 kg)  05/25/18 195 lb (88.5 kg)  05/16/18 195 lb (88.5 kg)   Body mass index is 39.41 kg/m.   Physical Exam Constitutional:      General: She is not in acute distress.    Appearance: Normal appearance. She is not ill-appearing.  Abdominal:     Palpations: Abdomen is soft.     Tenderness: There is abdominal tenderness (across lower abdomen - mild). There is right CVA tenderness. There is no left CVA tenderness, guarding or rebound.  Musculoskeletal:     Right lower leg: No edema.     Left lower leg: No edema.  Skin:    General: Skin is warm and dry.  Neurological:     Mental Status: She is alert.          Assessment & Plan:    See Problem List for Assessment and Plan of chronic medical problems.

## 2018-10-11 NOTE — Assessment & Plan Note (Signed)
Symptoms consistent with acute cystitis She has not been able to provide a sample during her visit, but she will go to lab for blood work and see if she can give a sample then Will start empiric treatment with Augmentin Hopefully she would be able to give [place] a sample so we can always get a urine culture for sensitivity She will let me know if there are no improvements in her symptoms or if her symptoms worsen

## 2018-10-11 NOTE — Patient Instructions (Signed)
  Tests ordered today. Your results will be released to [facility] (or called to you) after review, usually within 72hours after test completion. If any changes need to be made, you will be notified at that same time.  Medications reviewed and updated.  Changes include :   augmentin for your probable UTI  Your prescription(s) have been submitted to your pharmacy. Please take as directed and contact our office if you believe you are having problem(s) with the medication(s).    Please call if there is no improvement in your symptoms.

## 2018-10-11 NOTE — Assessment & Plan Note (Addendum)
She has been retaining fluids even before her UTI symptoms started and with the UTI has had urinary output not in proportion to water intake Concern for urinary retention possible kidney injury Increasing hydrochlorothiazide from once a day to twice a day did not help and may have injured her kidneys No significant swelling on exam CMP, CBC

## 2018-10-12 ENCOUNTER — Encounter: Payer: Self-pay | Admitting: Internal Medicine

## 2018-10-13 ENCOUNTER — Encounter: Payer: Self-pay | Admitting: Endocrinology

## 2018-10-14 ENCOUNTER — Other Ambulatory Visit: Payer: Self-pay

## 2018-10-14 ENCOUNTER — Ambulatory Visit: Payer: Medicare Other | Admitting: Endocrinology

## 2018-10-14 ENCOUNTER — Ambulatory Visit (INDEPENDENT_AMBULATORY_CARE_PROVIDER_SITE_OTHER): Payer: Medicare Other | Admitting: Endocrinology

## 2018-10-14 DIAGNOSIS — E1142 Type 2 diabetes mellitus with diabetic polyneuropathy: Secondary | ICD-10-CM | POA: Diagnosis not present

## 2018-10-14 MED ORDER — DULAGLUTIDE 1.5 MG/0.5ML ~~LOC~~ SOAJ: 1.5000 mg | SUBCUTANEOUS | 5 refills | Status: DC

## 2018-10-14 MED ORDER — COLESEVELAM HCL 625 MG PO TABS: 1875.0000 mg | ORAL_TABLET | Freq: Two times a day (BID) | ORAL | 3 refills | Status: DC

## 2018-10-14 NOTE — Progress Notes (Signed)
Subjective:    Patient ID: Heather [name], female    DOB: [birth year], 68 y.o.   MRN: [id number]  HPI telehealth visit today via telephone x 11 minutes.  Alternatives to telehealth are presented to this patient, and the patient agrees to the telehealth visit. Pt is advised of the cost of the visit, and agrees to this, also.   Patient is at home, and I am at the office.   Persons attending the telehealth visit: the patient and I.   Pt returns for f/u of diabetes mellitus: DM type: 2 Dx'ed: 6213 Complications: polyneuropathy.   Therapy: trulicity and repaglinide.    GDM: never DKA: never Severe hypoglycemia: never.  Pancreatitis: never Other: she has never been on insulin; short bowel syndrome limits metformin dosage; she did not tolerate [place] or Jardiance (vaginitis); edema precludes pioglitazone.   Interval history: she says cbg's are in the mid-100's.   pt states she feels well in general, except for leg edema.   Past Medical History:  Diagnosis Date  . Anemia   . Colon polyps   . Diabetes mellitus without complication ([place])   . Environmental allergies   . GERD (gastroesophageal reflux disease)   . Heart murmur   . History of DVT (deep vein thrombosis) 1970'S  . History of hiatal hernia   . History of transfusion   . Hyperlipidemia   . Hypertension   . Interstitial cystitis    Dr Janice Norrie  . Shortness of breath dyspnea    "due to low hemaglobin"    Past Surgical History:  Procedure Laterality Date  . APPENDECTOMY    . BILATERAL SALPINGOOPHORECTOMY     painful cysts  . COLONOSCOPY  2005   negative; Dr Earlean Shawl. F/U declined  . CYSTOSCOPY     X 4; Dr Janice Norrie  . LAPAROSCOPIC RIGHT COLECTOMY Right 08/19/2015   Procedure: LAPAROSCOPIC ASSISTED  RIGHT COLECTOMY;  Surgeon: Alphonsa Overall, MD;  Location: [facility];  Service: General;  Laterality: Right;  . PREMAGLINANT POLYPS    . TOTAL ABDOMINAL HYSTERECTOMY     metromenorrhagia    Social History   Socioeconomic History  .  Marital status: Widowed    Spouse name: Not on file  . Number of children: 2  . Years of education: Not on file  . Highest education level: Not on file  Occupational History  . Not on file  Social Needs  . Financial resource strain: Not hard at all  . Food insecurity:    Worry: Never true    Inability: Never true  . Transportation needs:    Medical: No    Non-medical: No  Tobacco Use  . Smoking status: Former Smoker    Last attempt to quit: 05/18/1984    Years since quitting: 34.4  . Smokeless tobacco: Never Used  . Tobacco comment: smoked 1972-1986, up to 1 ppd  Substance and Sexual Activity  . Alcohol use: No    Alcohol/week: 0.0 standard drinks  . Drug use: No  . Sexual activity: Never  Lifestyle  . Physical activity:    Days per week: 0 days    Minutes per session: 0 min  . Stress: To some extent  Relationships  . Social connections:    Talks on phone: More than three times a week    Gets together: More than three times a week    Attends religious service: 1 to 4 times per year    Active member of club or organization: Yes  Attends meetings of clubs or organizations: More than 4 times per year    Relationship status: Widowed  . Intimate partner violence:    Fear of current or ex partner: Not on file    Emotionally abused: Not on file    Physically abused: Not on file    Forced sexual activity: Not on file  Other Topics Concern  . Not on file  Social History Narrative   Lives with brother, daughter and 2 grandchildren in a 3 story home.  Has 2 children.  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     Highest level of education:  2 years of graduate school    Current Outpatient Medications on File Prior to Visit  Medication Sig Dispense Refill  . amoxicillin-clavulanate (AUGMENTIN) 875-125 MG tablet Take 1 tablet by mouth 2 (two) times daily. 20 tablet 0  . atorvastatin (LIPITOR) 10 MG tablet Take 1 tablet (10 mg  total) by mouth daily. 90 tablet 1  . Blood Glucose Monitoring Suppl (ONE TOUCH ULTRA 2) w/Device KIT 1 each by Does not apply route 4 (four) times daily. Use device to monitor glucose levels 4 times per day; E11.9 (Patient taking differently: 1 each by Does not apply route daily. Use device to monitor glucose levels 4 times per day; E11.9) 1 each 0  . Famotidine (PEPCID PO) 1 tablet as needed.     . gabapentin (NEURONTIN) 600 MG tablet Take 2 tablets (1,200 mg total) by mouth at bedtime. 180 tablet 1  . glucose blood (ONE TOUCH ULTRA TEST) test strip 1 each by Other route daily. Use to monitor glucose levels 4 times per day; E11.9 200 each 1  . hydrochlorothiazide (HYDRODIURIL) 25 MG tablet Take 1 tablet (25 mg total) by mouth daily. 90 tablet 1  . Lancets (ONETOUCH ULTRASOFT) lancets Use to monitor glucose levels 4 times per day; E11.9 (Patient taking differently: 1 each. Use to monitor glucose levels daily; E11.9) 100 each 12  . losartan (COZAAR) 100 MG tablet Take 1 tablet (100 mg total) by mouth daily. 90 tablet 1  . multivitamin-lutein (OCUVITE-LUTEIN) CAPS capsule Take 1 capsule by mouth daily.    . ONE TOUCH LANCETS MISC 1 Device by Other route daily. Use to test blood sugars once daily 90 each 3  . repaglinide (PRANDIN) 2 MG tablet Take 1 tablet (2 mg total) by mouth 3 (three) times daily before meals. 90 tablet 11  . Simethicone (GAS-X PO) Take 1 tablet by mouth daily as needed (gas.).     Marland Kitchen tiZANidine (ZANAFLEX) 2 MG tablet TAKE 1 TABLET (2 MG TOTAL) BY MOUTH AT BEDTIME AS NEEDED FOR MUSCLE SPASMS. 90 tablet 3   No current facility-administered medications on file prior to visit.     Allergies  Allergen Reactions  . Farxiga [Dapagliflozin]     Constant Vaginal Infections  . Flagyl [Metronidazole] Other (See Comments)    Made really sick. Was able to tolerate dose 4/17 with zofran  . Jardiance [Empagliflozin]     Constant Vaginal infections  . Lisinopril     04/03/15 cough  reported X several months  . Sulfonamide Derivatives     nausea    Family History  Problem Relation Age of Onset  . Heart attack Mother        in 45s  . Hypertension Mother   . Stroke Mother         in 67s  .  Other Father        Deceased, 65  . Stroke Maternal Grandfather 50  . Heart attack Paternal Grandfather        in 67s  . Breast cancer Sister   . Healthy Brother   . Healthy Son   . Healthy Daughter   . Diabetes Neg Hx     There were no vitals taken for this visit.  Review of Systems She denies hypoglycemia.      Objective:   Physical Exam    Lab Results  Component Value Date   HGBA1C 7.7 (H) 10/11/2018      Assessment & Plan:  Type 2 DM, with PN: worse Dyslipidemia: welchol will help.   Patient Instructions  I have sent a prescription to your pharmacy, to add "Welchol." Please come back for a follow-up appointment in 2-3 months.

## 2018-10-14 NOTE — Patient Instructions (Signed)
I have sent a prescription to your pharmacy, to add "Welchol." Please come back for a follow-up appointment in 2-3 months.

## 2018-10-15 LAB — URINE CULTURE
MICRO NUMBER:: 505167
SPECIMEN QUALITY:: ADEQUATE

## 2018-10-16 ENCOUNTER — Encounter: Payer: Self-pay | Admitting: Internal Medicine

## 2018-10-20 ENCOUNTER — Other Ambulatory Visit: Payer: Self-pay | Admitting: Internal Medicine

## 2018-10-26 ENCOUNTER — Other Ambulatory Visit: Payer: Self-pay | Admitting: Endocrinology

## 2018-10-26 ENCOUNTER — Other Ambulatory Visit: Payer: Self-pay | Admitting: Internal Medicine

## 2018-10-26 DIAGNOSIS — E1142 Type 2 diabetes mellitus with diabetic polyneuropathy: Secondary | ICD-10-CM

## 2018-10-26 DIAGNOSIS — I10 Essential (primary) hypertension: Secondary | ICD-10-CM

## 2018-11-24 NOTE — Progress Notes (Signed)
Subjective:    Patient ID: Heather [name], female    DOB: Dec 20, 1950, 68 y.o.   MRN: [id number]  HPI She is here for a physical exam.   She has no concerns.  Her sugars did go up and she has been working on getting them better controlled.    She denies changes in her history.   Medications and allergies reviewed with patient and updated if appropriate.  Patient Active Problem List   Diagnosis Date Noted  . Acute cystitis without hematuria 10/11/2018  . Cataract 11/22/2017  . Dry cough 11/22/2017  . Nocturnal leg cramps 02/02/2017  . DOE (dyspnea on exertion) 08/04/2016  . Obese 08/04/2016  . Generalized osteoarthritis of hand 02/04/2016  . Diabetic polyneuropathy associated with diabetes mellitus due to underlying condition ([place]) 11/27/2015  . Numbness and tingling of leg 10/09/2015  . Hiatal hernia, large 08/02/2015  . Colon polyps 08/02/2015  . Anemia, unspecified 04/21/2015  . Diabetes type 2, controlled ([place]) 05/03/2014  . Hyperlipidemia, mixed 03/18/2014  . Interstitial cystitis 07/08/2012  . Essential hypertension 02/10/2011    Current Outpatient Medications on File Prior to Visit  Medication Sig Dispense Refill  . atorvastatin (LIPITOR) 10 MG tablet TAKE 1 TABLET BY MOUTH  DAILY 90 tablet 1  . Blood Glucose Monitoring Suppl (ONE TOUCH ULTRA 2) w/Device KIT 1 each by Does not apply route 4 (four) times daily. Use device to monitor glucose levels 4 times per day; E11.9 (Patient taking differently: 1 each by Does not apply route daily. Use device to monitor glucose levels 4 times per day; E11.9) 1 each 0  . colesevelam (WELCHOL) 625 MG tablet Take 3 tablets (1,875 mg total) by mouth 2 (two) times daily with a meal. 270 tablet 3  . Dulaglutide (TRULICITY) 1.5 NP/0.0FR SOPN Inject 1.5 mg into the skin once a week. 12 pen 5  . Famotidine (PEPCID PO) 1 tablet as needed.     . gabapentin (NEURONTIN) 600 MG tablet TAKE 2 TABLETS BY MOUTH AT  BEDTIME 180 tablet 0  .  hydrochlorothiazide (HYDRODIURIL) 25 MG tablet TAKE 1 TABLET BY MOUTH  DAILY 90 tablet 1  . losartan (COZAAR) 100 MG tablet TAKE 1 TABLET BY MOUTH  DAILY 90 tablet 1  . multivitamin-lutein (OCUVITE-LUTEIN) CAPS capsule Take 1 capsule by mouth daily.    . ONE TOUCH LANCETS MISC 1 Device by Other route daily. Use to test blood sugars once daily 90 each 3  . ONETOUCH ULTRA test strip USE TO CHECK BLOOD SUGAR 4  TIMES DAILY 100 each 2  . repaglinide (PRANDIN) 2 MG tablet Take 1 tablet (2 mg total) by mouth 3 (three) times daily before meals. 90 tablet 11  . Simethicone (GAS-X PO) Take 1 tablet by mouth daily as needed (gas.).      No current facility-administered medications on file prior to visit.     Past Medical History:  Diagnosis Date  . Anemia   . Colon polyps   . Diabetes mellitus without complication ([place])   . Environmental allergies   . GERD (gastroesophageal reflux disease)   . Heart murmur   . History of DVT (deep vein thrombosis) 1970'S  . History of hiatal hernia   . History of transfusion   . Hyperlipidemia   . Hypertension   . Interstitial cystitis    Dr Janice Norrie  . Shortness of breath dyspnea    "due to low hemaglobin"    Past Surgical History:  Procedure Laterality Date  .  APPENDECTOMY    . BILATERAL SALPINGOOPHORECTOMY     painful cysts  . COLONOSCOPY  2005   negative; Dr Earlean Shawl. F/U declined  . CYSTOSCOPY     X 4; Dr Janice Norrie  . LAPAROSCOPIC RIGHT COLECTOMY Right 08/19/2015   Procedure: LAPAROSCOPIC ASSISTED  RIGHT COLECTOMY;  Surgeon: Alphonsa Overall, MD;  Location: [facility];  Service: General;  Laterality: Right;  . PREMAGLINANT POLYPS    . TOTAL ABDOMINAL HYSTERECTOMY     metromenorrhagia    Social History   Socioeconomic History  . Marital status: Widowed    Spouse name: Not on file  . Number of children: 2  . Years of education: Not on file  . Highest education level: Not on file  Occupational History  . Not on file  Social Needs  . Financial resource  strain: Not hard at all  . Food insecurity    Worry: Never true    Inability: Never true  . Transportation needs    Medical: No    Non-medical: No  Tobacco Use  . Smoking status: Former Smoker    Quit date: 05/18/1984    Years since quitting: 34.5  . Smokeless tobacco: Never Used  . Tobacco comment: smoked 1972-1986, up to 1 ppd  Substance and Sexual Activity  . Alcohol use: No    Alcohol/week: 0.0 standard drinks  . Drug use: No  . Sexual activity: Never  Lifestyle  . Physical activity    Days per week: 0 days    Minutes per session: 0 min  . Stress: To some extent  Relationships  . Social connections    Talks on phone: More than three times a week    Gets together: More than three times a week    Attends religious service: 1 to 4 times per year    Active member of club or organization: Yes    Attends meetings of clubs or organizations: More than 4 times per year    Relationship status: Widowed  Other Topics Concern  . Not on file  Social History Narrative   Lives with brother, daughter and 2 grandchildren in a 3 story home.  Has 2 children.  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     Highest level of education:  2 years of graduate school    Family History  Problem Relation Age of Onset  . Heart attack Mother        in 21s  . Hypertension Mother   . Stroke Mother         in 9s  . Other Father        Deceased, 71  . Stroke Maternal Grandfather 50  . Heart attack Paternal Grandfather        in 73s  . Breast cancer Sister   . Healthy Brother   . Healthy Son   . Healthy Daughter   . Diabetes Neg Hx     Review of Systems  Constitutional: Negative for chills and fever.  Eyes: Negative for visual disturbance (difficulty seeing at night).  Respiratory: Positive for cough (allergy related - intermittent). Negative for shortness of breath and wheezing.   Cardiovascular: Positive for leg swelling (ankles). Negative  for chest pain and palpitations.  Gastrointestinal: Negative for abdominal pain, blood in stool, constipation, diarrhea and nausea.       Occ gerd  Genitourinary: Negative for dysuria and hematuria.  Musculoskeletal: Positive for arthralgias.  Skin: Negative  for color change and rash.  Neurological: Positive for numbness (feet) and headaches. Negative for light-headedness.  Psychiatric/Behavioral: Negative for dysphoric mood. The patient is not nervous/anxious.        Objective:   Vitals:   11/25/18 0752 11/25/18 0904  BP: (!) 142/90 122/80  Pulse: 79   Resp: 16   Temp: 98.2 F (36.8 C)   SpO2: 97%    Filed Weights   11/25/18 0752  Weight: 200 lb 12.8 oz (91.1 kg)   Body mass index is 39.22 kg/m.  BP Readings from Last 3 Encounters:  11/25/18 122/80  10/11/18 132/70  05/25/18 120/72    Wt Readings from Last 3 Encounters:  11/25/18 200 lb 12.8 oz (91.1 kg)  10/11/18 201 lb 12.8 oz (91.5 kg)  05/25/18 195 lb (88.5 kg)     Physical Exam Constitutional: She appears well-developed and well-nourished. No distress.  HENT:  Head: Normocephalic and atraumatic.  Right Ear: External ear normal. Normal ear canal and TM Left Ear: External ear normal.  Normal ear canal and TM Mouth/Throat: Oropharynx is clear and moist.  Eyes: Conjunctivae and EOM are normal.  Neck: Neck supple. No tracheal deviation present. No thyromegaly present.  No carotid bruit  Cardiovascular: Normal rate, regular rhythm and normal heart sounds.   No murmur heard.  No edema. Pulmonary/Chest: Effort normal and breath sounds normal. No respiratory distress. She has no wheezes. She has no rales.  Breast: deferred   Abdominal: Soft. Obese, umbilical hernia - reducible.  She exhibits no distension. There is no tenderness.  Lymphadenopathy: She has no cervical adenopathy.  Skin: Skin is warm and dry. She is not diaphoretic.  Psychiatric: She has a normal mood and affect. Her behavior is normal.         Assessment & Plan:   Physical exam: Screening blood work  -- ordered Immunizations   All up to date Colonoscopy    Up to date  Mammogram   Up to date  Dexa   Up to date  Eye exams  Up to date  Exercise  Yard work, no other exercise - encouraged more regular exercise Weight   encouraged weight loss Skin   No concerns Substance abuse   none  See Problem List for Assessment and Plan of chronic medical problems.   FU in 6 months

## 2018-11-24 NOTE — Patient Instructions (Addendum)
Tests ordered today. Your results will be released to MyChart (or called to you) after review.  If any changes need to be made, you will be notified at that same time.  All other Health Maintenance issues reviewed.   All recommended immunizations and age-appropriate screenings are up-to-date or discussed.  No immunization administered today.   Medications reviewed and updated.  Changes include :   none    Please followup in 6 months    Health Maintenance, Female Adopting a healthy lifestyle and getting preventive care are important in promoting health and wellness. Ask your health care provider about:  The right schedule for you to have regular tests and exams.  Things you can do on your own to prevent diseases and keep yourself healthy. What should I know about diet, weight, and exercise? Eat a healthy diet   Eat a diet that includes plenty of vegetables, fruits, low-fat dairy products, and lean protein.  Do not eat a lot of foods that are high in solid fats, added sugars, or sodium. Maintain a healthy weight Body mass index (BMI) is used to identify weight problems. It estimates body fat based on height and weight. Your health care provider can help determine your BMI and help you achieve or maintain a healthy weight. Get regular exercise Get regular exercise. This is one of the most important things you can do for your health. Most adults should:  Exercise for at least 150 minutes each week. The exercise should increase your heart rate and make you sweat (moderate-intensity exercise).  Do strengthening exercises at least twice a week. This is in addition to the moderate-intensity exercise.  Spend less time sitting. Even light physical activity can be beneficial. Watch cholesterol and blood lipids Have your blood tested for lipids and cholesterol at 70 years of age, then have this test every 5 years. Have your cholesterol levels checked more often if:  Your lipid or  cholesterol levels are high.  You are older than 70 years of age.  You are at high risk for heart disease. What should I know about cancer screening? Depending on your health history and family history, you may need to have cancer screening at various ages. This may include screening for:  Breast cancer.  Cervical cancer.  Colorectal cancer.  Skin cancer.  Lung cancer. What should I know about heart disease, diabetes, and high blood pressure? Blood pressure and heart disease  High blood pressure causes heart disease and increases the risk of stroke. This is more likely to develop in people who have high blood pressure readings, are of African descent, or are overweight.  Have your blood pressure checked: ? Every 3-5 years if you are 18-39 years of age. ? Every year if you are 40 years old or older. Diabetes Have regular diabetes screenings. This checks your fasting blood sugar level. Have the screening done:  Once every three years after age 40 if you are at a normal weight and have a low risk for diabetes.  More often and at a younger age if you are overweight or have a high risk for diabetes. What should I know about preventing infection? Hepatitis B If you have a higher risk for hepatitis B, you should be screened for this virus. Talk with your health care provider to find out if you are at risk for hepatitis B infection. Hepatitis C Testing is recommended for:  Everyone born from 1945 through 1965.  Anyone with known risk factors for hepatitis C.   Sexually transmitted infections (STIs)  Get screened for STIs, including gonorrhea and chlamydia, if: ? You are sexually active and are younger than 70 years of age. ? You are older than 70 years of age and your health care provider tells you that you are at risk for this type of infection. ? Your sexual activity has changed since you were last screened, and you are at increased risk for chlamydia or gonorrhea. Ask your  health care provider if you are at risk.  Ask your health care provider about whether you are at high risk for HIV. Your health care provider may recommend a prescription medicine to help prevent HIV infection. If you choose to take medicine to prevent HIV, you should first get tested for HIV. You should then be tested every 3 months for as long as you are taking the medicine. Pregnancy  If you are about to stop having your period (premenopausal) and you may become pregnant, seek counseling before you get pregnant.  Take 400 to 800 micrograms (mcg) of folic acid every day if you become pregnant.  Ask for birth control (contraception) if you want to prevent pregnancy. Osteoporosis and menopause Osteoporosis is a disease in which the bones lose minerals and strength with aging. This can result in bone fractures. If you are 65 years old or older, or if you are at risk for osteoporosis and fractures, ask your health care provider if you should:  Be screened for bone loss.  Take a calcium or vitamin D supplement to lower your risk of fractures.  Be given hormone replacement therapy (HRT) to treat symptoms of menopause. Follow these instructions at home: Lifestyle  Do not use any products that contain nicotine or tobacco, such as cigarettes, e-cigarettes, and chewing tobacco. If you need help quitting, ask your health care provider.  Do not use street drugs.  Do not share needles.  Ask your health care provider for help if you need support or information about quitting drugs. Alcohol use  Do not drink alcohol if: ? Your health care provider tells you not to drink. ? You are pregnant, may be pregnant, or are planning to become pregnant.  If you drink alcohol: ? Limit how much you use to 0-1 drink a day. ? Limit intake if you are breastfeeding.  Be aware of how much alcohol is in your drink. In the [place] one drink equals one 12 oz bottle of beer (355 mL), one 5 oz glass of wine (148  mL), or one 1 oz glass of hard liquor (44 mL). General instructions  Schedule regular health, dental, and eye exams.  Stay current with your vaccines.  Tell your health care provider if: ? You often feel depressed. ? You have ever been abused or do not feel safe at home. Summary  Adopting a healthy lifestyle and getting preventive care are important in promoting health and wellness.  Follow your health care provider's instructions about healthy diet, exercising, and getting tested or screened for diseases.  Follow your health care provider's instructions on monitoring your cholesterol and blood pressure. This information is not intended to replace advice given to you by your health care provider. Make sure you discuss any questions you have with your health care provider. Document Released: 11/17/2010 Document Revised: 04/27/2018 Document Reviewed: 04/27/2018 Elsevier Patient Education  2020 Elsevier Inc.  

## 2018-11-25 ENCOUNTER — Ambulatory Visit (INDEPENDENT_AMBULATORY_CARE_PROVIDER_SITE_OTHER): Payer: Medicare Other | Admitting: Internal Medicine

## 2018-11-25 ENCOUNTER — Other Ambulatory Visit (INDEPENDENT_AMBULATORY_CARE_PROVIDER_SITE_OTHER): Payer: Medicare Other

## 2018-11-25 ENCOUNTER — Encounter: Payer: Self-pay | Admitting: Internal Medicine

## 2018-11-25 ENCOUNTER — Other Ambulatory Visit: Payer: Self-pay

## 2018-11-25 VITALS — BP 122/80 | HR 79 | Temp 98.2°F | Resp 16 | Ht 60.0 in | Wt 200.8 lb

## 2018-11-25 DIAGNOSIS — G4762 Sleep related leg cramps: Secondary | ICD-10-CM

## 2018-11-25 DIAGNOSIS — E782 Mixed hyperlipidemia: Secondary | ICD-10-CM

## 2018-11-25 DIAGNOSIS — I10 Essential (primary) hypertension: Secondary | ICD-10-CM

## 2018-11-25 DIAGNOSIS — Z6835 Body mass index (BMI) 35.0-35.9, adult: Secondary | ICD-10-CM

## 2018-11-25 DIAGNOSIS — E1142 Type 2 diabetes mellitus with diabetic polyneuropathy: Secondary | ICD-10-CM | POA: Diagnosis not present

## 2018-11-25 DIAGNOSIS — Z0000 Encounter for general adult medical examination without abnormal findings: Secondary | ICD-10-CM | POA: Diagnosis not present

## 2018-11-25 DIAGNOSIS — E0842 Diabetes mellitus due to underlying condition with diabetic polyneuropathy: Secondary | ICD-10-CM

## 2018-11-25 LAB — COMPREHENSIVE METABOLIC PANEL
ALT: 19 U/L (ref 0–35)
AST: 17 U/L (ref 0–37)
Albumin: 4.4 g/dL (ref 3.5–5.2)
Alkaline Phosphatase: 66 U/L (ref 39–117)
BUN: 15 mg/dL (ref 6–23)
CO2: 28 mEq/L (ref 19–32)
Calcium: 10.1 mg/dL (ref 8.4–10.5)
Chloride: 100 mEq/L (ref 96–112)
Creatinine, Ser: 0.81 mg/dL (ref 0.40–1.20)
GFR: 69.87 mL/min (ref >60.00)
Glucose, Bld: 88 mg/dL (ref 70–99)
Potassium: 3.7 mEq/L (ref 3.5–5.1)
Sodium: 137 mEq/L (ref 135–145)
Total Bilirubin: 1.1 mg/dL (ref 0.2–1.2)
Total Protein: 7.3 g/dL (ref 6.0–8.3)

## 2018-11-25 LAB — CBC WITH DIFFERENTIAL/PLATELET
Basophils Absolute: 0 10*3/uL (ref 0.0–0.1)
Basophils Relative: 0.4 % (ref 0.0–3.0)
Eosinophils Absolute: 0.2 10*3/uL (ref 0.0–0.7)
Eosinophils Relative: 2 % (ref 0.0–5.0)
HCT: 36.2 % (ref 36.0–46.0)
Hemoglobin: 11.8 g/dL — ABNORMAL LOW (ref 12.0–15.0)
Lymphocytes Relative: 19.1 % (ref 12.0–46.0)
Lymphs Abs: 1.9 10*3/uL (ref 0.7–4.0)
MCHC: 32.7 g/dL (ref 30.0–36.0)
MCV: 74.3 fl — ABNORMAL LOW (ref 78.0–100.0)
Monocytes Absolute: 0.7 10*3/uL (ref 0.1–1.0)
Monocytes Relative: 6.8 % (ref 3.0–12.0)
Neutro Abs: 7.1 10*3/uL (ref 1.4–7.7)
Neutrophils Relative %: 71.7 % (ref 43.0–77.0)
Platelets: 364 10*3/uL (ref 150.0–400.0)
RBC: 4.87 Mil/uL (ref 3.87–5.11)
RDW: 15.6 % — ABNORMAL HIGH (ref 11.5–15.5)
WBC: 10 10*3/uL (ref 4.0–10.5)

## 2018-11-25 LAB — LIPID PANEL
Cholesterol: 134 mg/dL (ref 0–200)
HDL: 56.6 mg/dL (ref >39.00)
NonHDL: 77.86
Total CHOL/HDL Ratio: 2
Triglycerides: 202 mg/dL — ABNORMAL HIGH (ref 0.0–149.0)
VLDL: 40.4 mg/dL — ABNORMAL HIGH (ref 0.0–40.0)

## 2018-11-25 LAB — HEMOGLOBIN A1C: Hgb A1c MFr Bld: 6.8 % — ABNORMAL HIGH (ref 4.6–6.5)

## 2018-11-25 LAB — LDL CHOLESTEROL, DIRECT: Direct LDL: 65 mg/dL

## 2018-11-25 LAB — TSH: TSH: 1.95 u[IU]/mL (ref 0.35–4.50)

## 2018-11-25 NOTE — Assessment & Plan Note (Addendum)
BP Readings from Last 3 Encounters:  11/25/18 (!) 142/90  10/11/18 132/70  05/25/18 120/72    BP slightly elevated here - better on repeat BP overall controlled Current regimen effective and well tolerated Continue current medications at current doses  cmp

## 2018-11-25 NOTE — Assessment & Plan Note (Signed)
Has numbness in feet Taking gabapentin at night - she does find it helpful

## 2018-11-25 NOTE — Assessment & Plan Note (Signed)
With diabetes, htn, cholesterol  Decrease portions Increase activity

## 2018-11-25 NOTE — Assessment & Plan Note (Addendum)
Lipids well controlled 05/2018 Check lipids, tsh, cmp Continue statin

## 2018-11-25 NOTE — Assessment & Plan Note (Signed)
Was taking tizanidine on occasion - has not taken in while Has some at home to use only if needed - discussed would ideally avoid it

## 2018-11-25 NOTE — Assessment & Plan Note (Signed)
Management per Dr Loanne Drilling a1c 7.7 two months ago Low sugar / carb diet Stressed regular exercise

## 2018-11-27 ENCOUNTER — Encounter: Payer: Self-pay | Admitting: Internal Medicine

## 2018-12-05 ENCOUNTER — Other Ambulatory Visit: Payer: Self-pay

## 2018-12-05 DIAGNOSIS — E1142 Type 2 diabetes mellitus with diabetic polyneuropathy: Secondary | ICD-10-CM

## 2018-12-05 MED ORDER — REPAGLINIDE 2 MG PO TABS: 2.0000 mg | ORAL_TABLET | Freq: Three times a day (TID) | ORAL | 11 refills | Status: DC

## 2018-12-08 ENCOUNTER — Encounter: Payer: Self-pay | Admitting: Internal Medicine

## 2018-12-09 DIAGNOSIS — Z1231 Encounter for screening mammogram for malignant neoplasm of breast: Secondary | ICD-10-CM | POA: Diagnosis not present

## 2018-12-09 DIAGNOSIS — Z803 Family history of malignant neoplasm of breast: Secondary | ICD-10-CM | POA: Diagnosis not present

## 2018-12-09 LAB — HM MAMMOGRAPHY

## 2018-12-16 ENCOUNTER — Ambulatory Visit (INDEPENDENT_AMBULATORY_CARE_PROVIDER_SITE_OTHER): Payer: Medicare Other | Admitting: Endocrinology

## 2018-12-16 ENCOUNTER — Encounter: Payer: Self-pay | Admitting: Endocrinology

## 2018-12-16 ENCOUNTER — Other Ambulatory Visit: Payer: Self-pay

## 2018-12-16 DIAGNOSIS — E1142 Type 2 diabetes mellitus with diabetic polyneuropathy: Secondary | ICD-10-CM | POA: Diagnosis not present

## 2018-12-16 MED ORDER — REPAGLINIDE 1 MG PO TABS: 1.0000 mg | ORAL_TABLET | Freq: Three times a day (TID) | ORAL | 11 refills | Status: DC

## 2018-12-16 NOTE — Patient Instructions (Addendum)
Please reduce the repaglinide to 1 mg 3 times a day (just before each meal).   Please continue the same other medications.  Please come back for a follow-up appointment in 3 months.

## 2018-12-16 NOTE — Progress Notes (Signed)
Subjective:    Patient ID: Heather [name], female    DOB: Sep 03, 1950, 68 y.o.   MRN: [id number]  HPI telehealth visit today via telephone x 12 minutes.  Alternatives to telehealth are presented to this patient, and the patient agrees to the telehealth visit.  Pt is advised of the cost of the visit, and agrees to this, also.   Patient is at home, and I am at the office.   Persons attending the telehealth visit: the patient and I.   Pt returns for f/u of diabetes mellitus: DM type: 2 Dx'ed: 6195 Complications: polyneuropathy.   Therapy: trulicity and 2 oral meds.   GDM: never DKA: never Severe hypoglycemia: never.  Pancreatitis: never Other: she has never been on insulin; short bowel syndrome limits metformin dosage; she did not tolerate [place] or Jardiance (vaginitis); edema precludes pioglitazone.   Interval history: Pt says she is having trouble fitting in all of her meds.  However, cbg's are well-controlled.  She says cbg varies from 86-100's.   Past Medical History:  Diagnosis Date  . Anemia   . Colon polyps   . Diabetes mellitus without complication ([place])   . Environmental allergies   . GERD (gastroesophageal reflux disease)   . Heart murmur   . History of DVT (deep vein thrombosis) 1970'S  . History of hiatal hernia   . History of transfusion   . Hyperlipidemia   . Hypertension   . Interstitial cystitis    Dr Janice Norrie  . Shortness of breath dyspnea    "due to low hemaglobin"    Past Surgical History:  Procedure Laterality Date  . APPENDECTOMY    . BILATERAL SALPINGOOPHORECTOMY     painful cysts  . COLONOSCOPY  2005   negative; Dr Earlean Shawl. F/U declined  . CYSTOSCOPY     X 4; Dr Janice Norrie  . LAPAROSCOPIC RIGHT COLECTOMY Right 08/19/2015   Procedure: LAPAROSCOPIC ASSISTED  RIGHT COLECTOMY;  Surgeon: Alphonsa Overall, MD;  Location: [facility];  Service: General;  Laterality: Right;  . PREMAGLINANT POLYPS    . TOTAL ABDOMINAL HYSTERECTOMY     metromenorrhagia    Social  History   Socioeconomic History  . Marital status: Widowed    Spouse name: Not on file  . Number of children: 2  . Years of education: Not on file  . Highest education level: Not on file  Occupational History  . Not on file  Social Needs  . Financial resource strain: Not hard at all  . Food insecurity    Worry: Never true    Inability: Never true  . Transportation needs    Medical: No    Non-medical: No  Tobacco Use  . Smoking status: Former Smoker    Quit date: 05/18/1984    Years since quitting: 34.6  . Smokeless tobacco: Never Used  . Tobacco comment: smoked 1972-1986, up to 1 ppd  Substance and Sexual Activity  . Alcohol use: No    Alcohol/week: 0.0 standard drinks  . Drug use: No  . Sexual activity: Never  Lifestyle  . Physical activity    Days per week: 0 days    Minutes per session: 0 min  . Stress: To some extent  Relationships  . Social connections    Talks on phone: More than three times a week    Gets together: More than three times a week    Attends religious service: 1 to 4 times per year    Active member of club or  organization: Yes    Attends meetings of clubs or organizations: More than 4 times per year    Relationship status: Widowed  . Intimate partner violence    Fear of current or ex partner: Not on file    Emotionally abused: Not on file    Physically abused: Not on file    Forced sexual activity: Not on file  Other Topics Concern  . Not on file  Social History Narrative   Lives with brother, daughter and 2 grandchildren in a 3 story home.  Has 2 children.  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     Highest level of education:  2 years of graduate school    Current Outpatient Medications on File Prior to Visit  Medication Sig Dispense Refill  . atorvastatin (LIPITOR) 10 MG tablet TAKE 1 TABLET BY MOUTH  DAILY 90 tablet 1  . Blood Glucose Monitoring Suppl (ONE TOUCH ULTRA 2) w/Device KIT 1  each by Does not apply route 4 (four) times daily. Use device to monitor glucose levels 4 times per day; E11.9 (Patient taking differently: 1 each by Does not apply route daily. Use device to monitor glucose levels 4 times per day; E11.9) 1 each 0  . colesevelam (WELCHOL) 625 MG tablet Take 3 tablets (1,875 mg total) by mouth 2 (two) times daily with a meal. 270 tablet 3  . Dulaglutide (TRULICITY) 1.5 WI/0.9BD SOPN Inject 1.5 mg into the skin once a week. 12 pen 5  . Famotidine (PEPCID PO) 1 tablet as needed.     . gabapentin (NEURONTIN) 600 MG tablet TAKE 2 TABLETS BY MOUTH AT  BEDTIME 180 tablet 0  . hydrochlorothiazide (HYDRODIURIL) 25 MG tablet TAKE 1 TABLET BY MOUTH  DAILY 90 tablet 1  . losartan (COZAAR) 100 MG tablet TAKE 1 TABLET BY MOUTH  DAILY 90 tablet 1  . multivitamin-lutein (OCUVITE-LUTEIN) CAPS capsule Take 1 capsule by mouth daily.    . ONE TOUCH LANCETS MISC 1 Device by Other route daily. Use to test blood sugars once daily 90 each 3  . ONETOUCH ULTRA test strip USE TO CHECK BLOOD SUGAR 4  TIMES DAILY 100 each 2  . Simethicone (GAS-X PO) Take 1 tablet by mouth daily as needed (gas.).      No current facility-administered medications on file prior to visit.     Allergies  Allergen Reactions  . Farxiga [Dapagliflozin]     Constant Vaginal Infections  . Flagyl [Metronidazole] Other (See Comments)    Made really sick. Was able to tolerate dose 4/17 with zofran  . Jardiance [Empagliflozin]     Constant Vaginal infections  . Lisinopril     04/03/15 cough reported X several months  . Sulfonamide Derivatives     nausea    Family History  Problem Relation Age of Onset  . Heart attack Mother        in 31s  . Hypertension Mother   . Stroke Mother         in 29s  . Other Father        Deceased, 45  . Stroke Maternal Grandfather 50  . Heart attack Paternal Grandfather        in 77s  . Breast cancer Sister   . Healthy Brother   . Healthy Son   . Healthy Daughter   .  Diabetes Neg Hx     There were no vitals taken for this  visit.  Review of Systems She denies hypoglycemia.     Objective:   Physical Exam   Lab Results  Component Value Date   HGBA1C 6.8 (H) 11/25/2018       Assessment & Plan:  Type 2 DM: overcontrolled Noncompliance with meds: reducing med frequency will help  Patient Instructions  Please reduce the repaglinide to 1 mg 3 times a day (just before each meal).   Please continue the same other medications.  Please come back for a follow-up appointment in 3 months.

## 2018-12-19 DIAGNOSIS — H40011 Open angle with borderline findings, low risk, right eye: Secondary | ICD-10-CM | POA: Diagnosis not present

## 2018-12-21 ENCOUNTER — Encounter: Payer: Self-pay | Admitting: Internal Medicine

## 2018-12-22 ENCOUNTER — Other Ambulatory Visit: Payer: Self-pay | Admitting: Internal Medicine

## 2018-12-22 ENCOUNTER — Other Ambulatory Visit: Payer: Self-pay

## 2018-12-22 ENCOUNTER — Telehealth: Payer: Self-pay | Admitting: Endocrinology

## 2018-12-22 DIAGNOSIS — E1142 Type 2 diabetes mellitus with diabetic polyneuropathy: Secondary | ICD-10-CM

## 2018-12-22 MED ORDER — REPAGLINIDE 1 MG PO TABS: 1.0000 mg | ORAL_TABLET | Freq: Three times a day (TID) | ORAL | 11 refills | Status: DC

## 2018-12-22 NOTE — Telephone Encounter (Signed)
repaglinide (PRANDIN) 1 MG tablet 90 tablet 11 12/22/2018    Sig - Route: Take 1 tablet (1 mg total) by mouth 3 (three) times daily before meals. - Oral   Sent to pharmacy as: repaglinide (PRANDIN) 1 MG tablet   E-Prescribing Status: Receipt confirmed by pharmacy (12/22/2018 1:57 PM EDT)

## 2018-12-22 NOTE — Telephone Encounter (Signed)
MEDICATION: repaglinide (PRANDIN) 1 MG tablet  PHARMACY:  [facility]  IS THIS A 90 DAY SUPPLY : UES  IS PATIENT OUT OF MEDICATION:   IF NOT; HOW MUCH IS LEFT: Patient states that Dr.Ellison was wanting her to cut her 2mg  tablets in half and she is having a hard time  LAST APPOINTMENT DATE: @7 /31/2020  NEXT APPOINTMENT DATE:@10 /27/2020  DO WE HAVE YOUR PERMISSION TO LEAVE A DETAILED MESSAGE:  OTHER COMMENTS:    **Let patient know to contact pharmacy at the end of the day to make sure medication is ready. **  ** Please notify patient to allow 48-72 hours to process**  **Encourage patient to contact the pharmacy for refills or they can request refills through [facility]**

## 2018-12-30 ENCOUNTER — Other Ambulatory Visit: Payer: Self-pay | Admitting: Internal Medicine

## 2019-01-16 ENCOUNTER — Other Ambulatory Visit: Payer: Self-pay

## 2019-01-16 ENCOUNTER — Encounter: Payer: Self-pay | Admitting: Internal Medicine

## 2019-01-16 ENCOUNTER — Ambulatory Visit (INDEPENDENT_AMBULATORY_CARE_PROVIDER_SITE_OTHER): Payer: Medicare Other | Admitting: Internal Medicine

## 2019-01-16 VITALS — BP 120/80 | HR 73 | Temp 98.2°F | Ht 60.0 in | Wt 200.0 lb

## 2019-01-16 DIAGNOSIS — R300 Dysuria: Secondary | ICD-10-CM

## 2019-01-16 MED ORDER — NITROFURANTOIN MONOHYD MACRO 100 MG PO CAPS: 100.0000 mg | ORAL_CAPSULE | Freq: Two times a day (BID) | ORAL | 0 refills | Status: DC

## 2019-01-16 NOTE — Progress Notes (Signed)
   Subjective:   Patient ID: Heather [name], female    DOB: [birth year], 68 y.o.   MRN: [id number]  HPI The patient is a 68 YO female coming in for burning with urination and bladder pain. Started last evening. She is drinking extra water and taking advil for pain. Denies fevers or chills. Denies nausea or vomiting. Denies constipation or diarrhea. Overall it is not improving. Has tried advil without relief. She is having difficulty urinating at the office as she has some social anxiety with urination.   PMH, [facility], social history reviewed and updated  Review of Systems  Constitutional: Negative.   Respiratory: Negative.   Cardiovascular: Negative.   Gastrointestinal: Positive for abdominal pain. Negative for abdominal distention, constipation, diarrhea, nausea and vomiting.  Genitourinary: Positive for dysuria, frequency and urgency.  Musculoskeletal: Negative.   Skin: Negative.     Objective:  Physical Exam Constitutional:      Appearance: She is well-developed.  HENT:     Head: Normocephalic and atraumatic.  Neck:     Musculoskeletal: Normal range of motion.  Cardiovascular:     Rate and Rhythm: Normal rate and regular rhythm.  Pulmonary:     Effort: Pulmonary effort is normal. No respiratory distress.     Breath sounds: Normal breath sounds. No wheezing or rales.  Abdominal:     General: Bowel sounds are normal. There is no distension.     Palpations: Abdomen is soft.     Tenderness: There is abdominal tenderness. There is no rebound.     Comments: Mild suprapubic tenderness  Skin:    General: Skin is warm and dry.  Neurological:     Mental Status: She is alert and oriented to person, place, and time.     Coordination: Coordination normal.     Vitals:   01/16/19 1546  BP: 120/80  Pulse: 73  Temp: 98.2 F (36.8 C)  TempSrc: Oral  SpO2: 98%  Weight: 200 lb (90.7 kg)  Height: 5' (1.524 m)    Assessment & Plan:

## 2019-01-16 NOTE — Patient Instructions (Signed)
We have sent in the nitrofurantoin to take 1 pill twice a day for 1 week.

## 2019-01-17 DIAGNOSIS — R300 Dysuria: Secondary | ICD-10-CM | POA: Insufficient documentation

## 2019-01-17 NOTE — Assessment & Plan Note (Signed)
Given symptoms will treat presumptively with macrobid 1 week course. She is unable to void at the office today. If no improvement in 2 days needs sample for U/A and culture.

## 2019-01-30 ENCOUNTER — Telehealth: Payer: Self-pay | Admitting: Endocrinology

## 2019-01-30 ENCOUNTER — Other Ambulatory Visit: Payer: Self-pay

## 2019-01-30 NOTE — Telephone Encounter (Signed)
OK, please d/c colesevelam

## 2019-01-30 NOTE — Telephone Encounter (Signed)
Patient called and states that since starting the Colesevelam she has been experiencing extreme joint pain with movement, creating stumbling effect. States that the only relief that she gets is temporary by taking 3 Advil at a time is getting worse with each day since starting the medication.  Patient requests a call back with guidance on how to proceed [phone]

## 2019-01-30 NOTE — Telephone Encounter (Signed)
Please advise 

## 2019-01-30 NOTE — Telephone Encounter (Signed)
Called pt and informed of Dr. Ellison's advice. Verbalized acceptance and understanding. 

## 2019-02-10 ENCOUNTER — Ambulatory Visit (INDEPENDENT_AMBULATORY_CARE_PROVIDER_SITE_OTHER): Payer: Medicare Other | Admitting: Internal Medicine

## 2019-02-10 ENCOUNTER — Other Ambulatory Visit: Payer: Self-pay

## 2019-02-10 ENCOUNTER — Other Ambulatory Visit: Payer: Medicare Other

## 2019-02-10 ENCOUNTER — Encounter: Payer: Self-pay | Admitting: Internal Medicine

## 2019-02-10 VITALS — BP 138/82 | HR 85 | Temp 98.1°F | Resp 16 | Ht 60.0 in | Wt 203.8 lb

## 2019-02-10 DIAGNOSIS — N3000 Acute cystitis without hematuria: Secondary | ICD-10-CM

## 2019-02-10 DIAGNOSIS — R300 Dysuria: Secondary | ICD-10-CM

## 2019-02-10 LAB — POCT URINALYSIS DIPSTICK
Bilirubin, UA: NEGATIVE
Blood, UA: NEGATIVE
Glucose, UA: NEGATIVE
Nitrite, UA: NEGATIVE
Protein, UA: POSITIVE — AB
Spec Grav, UA: 1.02 (ref 1.010–1.025)
Urobilinogen, UA: NEGATIVE E.U./dL — AB
pH, UA: 6 (ref 5.0–8.0)

## 2019-02-10 MED ORDER — CEPHALEXIN 500 MG PO CAPS: 500.0000 mg | ORAL_CAPSULE | Freq: Two times a day (BID) | ORAL | 0 refills | Status: DC

## 2019-02-10 NOTE — Progress Notes (Signed)
Subjective:    Patient ID: Heather [name], female    DOB: [birth year], 68 y.o.   MRN: [id number]  HPI The patient is here for an acute visit.  ? UTI:  Her symptoms started about 3 weeks ago.  She was seen at that time.  She was prescribed Macrobid and her symptoms improved.  Last week her symptoms worsened again.  Last weekend her symptoms got much worse and she started taking AZO.  She states dysuria, urinary frequency, urinary urgency, suprapubic abdominal pain.  She denies hematuria, back pain, nausea, fever.  She has increased her water intake.      Medications and allergies reviewed with patient and updated if appropriate.  Patient Active Problem List   Diagnosis Date Noted  . Dysuria 01/17/2019  . Acute cystitis without hematuria 10/11/2018  . Cataract 11/22/2017  . Dry cough 11/22/2017  . Nocturnal leg cramps 02/02/2017  . DOE (dyspnea on exertion) 08/04/2016  . Obese 08/04/2016  . Generalized osteoarthritis of hand 02/04/2016  . Diabetic polyneuropathy associated with diabetes mellitus due to underlying condition ([place]) 11/27/2015  . Numbness and tingling of leg 10/09/2015  . Hiatal hernia, large 08/02/2015  . Colon polyps 08/02/2015  . Anemia, unspecified 04/21/2015  . Diabetes type 2, controlled ([place]) 05/03/2014  . Hyperlipidemia, mixed 03/18/2014  . Interstitial cystitis 07/08/2012  . Essential hypertension 02/10/2011    Current Outpatient Medications on File Prior to Visit  Medication Sig Dispense Refill  . atorvastatin (LIPITOR) 10 MG tablet TAKE 1 TABLET BY MOUTH  DAILY 90 tablet 1  . Blood Glucose Monitoring Suppl (ONE TOUCH ULTRA 2) w/Device KIT 1 each by Does not apply route 4 (four) times daily. Use device to monitor glucose levels 4 times per day; E11.9 (Patient taking differently: 1 each by Does not apply route daily. Use device to monitor glucose levels 4 times per day; E11.9) 1 each 0  . Dulaglutide (TRULICITY) 1.5 MI/6.8EH SOPN Inject 1.5 mg into the  skin once a week. 12 pen 5  . Famotidine (PEPCID PO) 1 tablet as needed.     . gabapentin (NEURONTIN) 600 MG tablet TAKE 2 TABLETS BY MOUTH AT  BEDTIME 180 tablet 1  . hydrochlorothiazide (HYDRODIURIL) 25 MG tablet TAKE 1 TABLET BY MOUTH  DAILY 90 tablet 1  . losartan (COZAAR) 100 MG tablet TAKE 1 TABLET BY MOUTH  DAILY 90 tablet 1  . multivitamin-lutein (OCUVITE-LUTEIN) CAPS capsule Take 1 capsule by mouth daily.    . ONE TOUCH LANCETS MISC 1 Device by Other route daily. Use to test blood sugars once daily 90 each 3  . ONETOUCH ULTRA test strip USE TO CHECK BLOOD SUGAR 4  TIMES DAILY 100 each 2  . repaglinide (PRANDIN) 1 MG tablet Take 1 tablet (1 mg total) by mouth 3 (three) times daily before meals. 90 tablet 11  . Simethicone (GAS-X PO) Take 1 tablet by mouth daily as needed (gas.).      No current facility-administered medications on file prior to visit.     Past Medical History:  Diagnosis Date  . Anemia   . Colon polyps   . Diabetes mellitus without complication ([place])   . Environmental allergies   . GERD (gastroesophageal reflux disease)   . Heart murmur   . History of DVT (deep vein thrombosis) 1970'S  . History of hiatal hernia   . History of transfusion   . Hyperlipidemia   . Hypertension   . Interstitial cystitis    Dr  Nesi  . Shortness of breath dyspnea    "due to low hemaglobin"    Past Surgical History:  Procedure Laterality Date  . APPENDECTOMY    . BILATERAL SALPINGOOPHORECTOMY     painful cysts  . COLONOSCOPY  2005   negative; Dr Earlean Shawl. F/U declined  . CYSTOSCOPY     X 4; Dr Janice Norrie  . LAPAROSCOPIC RIGHT COLECTOMY Right 08/19/2015   Procedure: LAPAROSCOPIC ASSISTED  RIGHT COLECTOMY;  Surgeon: Alphonsa Overall, MD;  Location: [facility];  Service: General;  Laterality: Right;  . PREMAGLINANT POLYPS    . TOTAL ABDOMINAL HYSTERECTOMY     metromenorrhagia    Social History   Socioeconomic History  . Marital status: Widowed    Spouse name: Not on file  . Number  of children: 2  . Years of education: Not on file  . Highest education level: Not on file  Occupational History  . Not on file  Social Needs  . Financial resource strain: Not hard at all  . Food insecurity    Worry: Never true    Inability: Never true  . Transportation needs    Medical: No    Non-medical: No  Tobacco Use  . Smoking status: Former Smoker    Quit date: 05/18/1984    Years since quitting: 34.7  . Smokeless tobacco: Never Used  . Tobacco comment: smoked 1972-1986, up to 1 ppd  Substance and Sexual Activity  . Alcohol use: No    Alcohol/week: 0.0 standard drinks  . Drug use: No  . Sexual activity: Never  Lifestyle  . Physical activity    Days per week: 0 days    Minutes per session: 0 min  . Stress: To some extent  Relationships  . Social connections    Talks on phone: More than three times a week    Gets together: More than three times a week    Attends religious service: 1 to 4 times per year    Active member of club or organization: Yes    Attends meetings of clubs or organizations: More than 4 times per year    Relationship status: Widowed  Other Topics Concern  . Not on file  Social History Narrative   Lives with brother, daughter and 2 grandchildren in a 3 story home.  Has 2 children.  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     Highest level of education:  2 years of graduate school    Family History  Problem Relation Age of Onset  . Heart attack Mother        in 72s  . Hypertension Mother   . Stroke Mother         in 80s  . Other Father        Deceased, 60  . Stroke Maternal Grandfather 50  . Heart attack Paternal Grandfather        in 33s  . Breast cancer Sister   . Healthy Brother   . Healthy Son   . Healthy Daughter   . Diabetes Neg Hx     Review of Systems Per HPI    Objective:   Vitals:   02/10/19 1018  BP: 138/82  Pulse: 85  Resp: 16  Temp: 98.1 F (36.7 C)  SpO2: 99%    BP Readings from Last 3 Encounters:  02/10/19 138/82  01/16/19 120/80  11/25/18 122/80   Wt Readings from Last 3 Encounters:  02/10/19 203 lb 12.8 oz (92.4 kg)  01/16/19 200 lb (90.7 kg)  11/25/18 200 lb 12.8 oz (91.1 kg)   Body mass index is 39.8 kg/m.   Physical Exam Constitutional:      General: She is not in acute distress.    Appearance: Normal appearance. She is not ill-appearing.  HENT:     Head: Normocephalic and atraumatic.  Abdominal:     Tenderness: There is abdominal tenderness (suprapubic region). There is no right CVA tenderness, left CVA tenderness, guarding or rebound.  Skin:    General: Skin is warm and dry.  Neurological:     Mental Status: She is alert.            Assessment & Plan:    See Problem List for Assessment and Plan of chronic medical problems.

## 2019-02-10 NOTE — Patient Instructions (Signed)
Take the antibiotic as prescribed.  Take tylenol if needed.     Increase your water intake.   Call if no improvement     Urinary Tract Infection, Adult A urinary tract infection (UTI) is an infection of any part of the urinary tract, which includes the kidneys, ureters, bladder, and urethra. These organs make, store, and get rid of urine in the body. UTI can be a bladder infection (cystitis) or kidney infection (pyelonephritis). What are the causes? This infection may be caused by fungi, viruses, or bacteria. Bacteria are the most common cause of UTIs. This condition can also be caused by repeated incomplete emptying of the bladder during urination. What increases the risk? This condition is more likely to develop if:  You ignore your need to urinate or hold urine for long periods of time.  You do not empty your bladder completely during urination.  You wipe back to front after urinating or having a bowel movement, if you are female.  You are uncircumcised, if you are female.  You are constipated.  You have a urinary catheter that stays in place (indwelling).  You have a weak defense (immune) system.  You have a medical condition that affects your bowels, kidneys, or bladder.  You have diabetes.  You take antibiotic medicines frequently or for long periods of time, and the antibiotics no longer work well against certain types of infections (antibiotic resistance).  You take medicines that irritate your urinary tract.  You are exposed to chemicals that irritate your urinary tract.  You are female.  What are the signs or symptoms? Symptoms of this condition include:  Fever.  Frequent urination or passing small amounts of urine frequently.  Needing to urinate urgently.  Pain or burning with urination.  Urine that smells bad or unusual.  Cloudy urine.  Pain in the lower abdomen or back.  Trouble urinating.  Blood in the urine.  Vomiting or being less hungry than  normal.  Diarrhea or abdominal pain.  Vaginal discharge, if you are female.  How is this diagnosed? This condition is diagnosed with a medical history and physical exam. You will also need to provide a urine sample to test your urine. Other tests may be done, including:  Blood tests.  Sexually transmitted disease (STD) testing.  If you have had more than one UTI, a cystoscopy or imaging studies may be done to determine the cause of the infections. How is this treated? Treatment for this condition often includes a combination of two or more of the following:  Antibiotic medicine.  Other medicines to treat less common causes of UTI.  Over-the-counter medicines to treat pain.  Drinking enough water to stay hydrated.  Follow these instructions at home:  Take over-the-counter and prescription medicines only as told by your health care provider.  If you were prescribed an antibiotic, take it as told by your health care provider. Do not stop taking the antibiotic even if you start to feel better.  Avoid alcohol, caffeine, tea, and carbonated beverages. They can irritate your bladder.  Drink enough fluid to keep your urine clear or pale yellow.  Keep all follow-up visits as told by your health care provider. This is important.  Make sure to: ? Empty your bladder often and completely. Do not hold urine for long periods of time. ? Empty your bladder before and after sex. ? Wipe from front to back after a bowel movement if you are female. Use each tissue one time when you   wipe. Contact a health care provider if:  You have back pain.  You have a fever.  You feel nauseous or vomit.  Your symptoms do not get better after 3 days.  Your symptoms go away and then return. Get help right away if:  You have severe back pain or lower abdominal pain.  You are vomiting and cannot keep down any medicines or water. This information is not intended to replace advice given to you by  your health care provider. Make sure you discuss any questions you have with your health care provider. Document Released: 02/11/2005 Document Revised: 10/16/2015 Document Reviewed: 03/25/2015 Elsevier Interactive Patient Education  2018 Elsevier Inc.   

## 2019-02-10 NOTE — Assessment & Plan Note (Signed)
Urine dip consistent with UTI Will send urine for culture Take the antibiotic as prescribed.   Take tylenol if needed.   Increase your water intake.  Call if no improvement   

## 2019-02-12 ENCOUNTER — Encounter: Payer: Self-pay | Admitting: Internal Medicine

## 2019-02-12 LAB — URINE CULTURE
MICRO NUMBER:: 923161
Result:: NO GROWTH
SPECIMEN QUALITY:: ADEQUATE

## 2019-03-10 ENCOUNTER — Other Ambulatory Visit: Payer: Self-pay

## 2019-03-13 ENCOUNTER — Encounter (INDEPENDENT_AMBULATORY_CARE_PROVIDER_SITE_OTHER): Payer: Self-pay

## 2019-03-13 ENCOUNTER — Encounter: Payer: Self-pay | Admitting: Endocrinology

## 2019-03-14 ENCOUNTER — Other Ambulatory Visit: Payer: Self-pay

## 2019-03-14 ENCOUNTER — Ambulatory Visit (INDEPENDENT_AMBULATORY_CARE_PROVIDER_SITE_OTHER): Payer: Medicare Other | Admitting: Endocrinology

## 2019-03-14 DIAGNOSIS — E1142 Type 2 diabetes mellitus with diabetic polyneuropathy: Secondary | ICD-10-CM | POA: Diagnosis not present

## 2019-03-14 MED ORDER — TRULICITY 1.5 MG/0.5ML ~~LOC~~ SOAJ: 1.5000 mg | SUBCUTANEOUS | 5 refills | Status: DC

## 2019-03-14 MED ORDER — REPAGLINIDE 2 MG PO TABS: 2.0000 mg | ORAL_TABLET | Freq: Three times a day (TID) | ORAL | 11 refills | Status: DC

## 2019-03-14 NOTE — Patient Instructions (Addendum)
Please increase the repaglinide back up to 2 mg 3 times a day (just before each meal).   Please continue the same other medications.  Please come back for a follow-up appointment in 3 months.

## 2019-03-14 NOTE — Progress Notes (Signed)
Subjective:    Patient ID: Heather [name], female    DOB: March 30, 1951, 68 y.o.   MRN: [id number]  HPI telehealth visit today via telephone x 9 minutes.  Alternatives to telehealth are presented to this patient, and the patient agrees to the telehealth visit.  Pt is advised of the cost of the visit, and agrees to this, also.   Patient is at home, and I am at the office.   Persons attending the telehealth visit: the patient and I.   Pt returns for f/u of diabetes mellitus: DM type: 2 Dx'ed: 0258 Complications: polyneuropathy.   Therapy: trulicity and 2 repaglinide.   GDM: never DKA: never Severe hypoglycemia: never.  Pancreatitis: never Other: she has never been on insulin; short bowel syndrome limits metformin dosage; she did not tolerate [place] or Jardiance (vaginitis); edema precludes pioglitazone.   Interval history: Pt says she is having trouble fitting in all of her meds.  She says cbg's vary from 115-164.   Past Medical History:  Diagnosis Date  . Anemia   . Colon polyps   . Diabetes mellitus without complication ([place])   . Environmental allergies   . GERD (gastroesophageal reflux disease)   . Heart murmur   . History of DVT (deep vein thrombosis) 1970'S  . History of hiatal hernia   . History of transfusion   . Hyperlipidemia   . Hypertension   . Interstitial cystitis    Dr Janice Norrie  . Shortness of breath dyspnea    "due to low hemaglobin"    Past Surgical History:  Procedure Laterality Date  . APPENDECTOMY    . BILATERAL SALPINGOOPHORECTOMY     painful cysts  . COLONOSCOPY  2005   negative; Dr Earlean Shawl. F/U declined  . CYSTOSCOPY     X 4; Dr Janice Norrie  . LAPAROSCOPIC RIGHT COLECTOMY Right 08/19/2015   Procedure: LAPAROSCOPIC ASSISTED  RIGHT COLECTOMY;  Surgeon: Alphonsa Overall, MD;  Location: [facility];  Service: General;  Laterality: Right;  . PREMAGLINANT POLYPS    . TOTAL ABDOMINAL HYSTERECTOMY     metromenorrhagia    Social History   Socioeconomic History  .  Marital status: Widowed    Spouse name: Not on file  . Number of children: 2  . Years of education: Not on file  . Highest education level: Not on file  Occupational History  . Not on file  Social Needs  . Financial resource strain: Not hard at all  . Food insecurity    Worry: Never true    Inability: Never true  . Transportation needs    Medical: No    Non-medical: No  Tobacco Use  . Smoking status: Former Smoker    Quit date: 05/18/1984    Years since quitting: 34.8  . Smokeless tobacco: Never Used  . Tobacco comment: smoked 1972-1986, up to 1 ppd  Substance and Sexual Activity  . Alcohol use: No    Alcohol/week: 0.0 standard drinks  . Drug use: No  . Sexual activity: Never  Lifestyle  . Physical activity    Days per week: 0 days    Minutes per session: 0 min  . Stress: To some extent  Relationships  . Social connections    Talks on phone: More than three times a week    Gets together: More than three times a week    Attends religious service: 1 to 4 times per year    Active member of club or organization: Yes    Attends  meetings of clubs or organizations: More than 4 times per year    Relationship status: Widowed  . Intimate partner violence    Fear of current or ex partner: Not on file    Emotionally abused: Not on file    Physically abused: Not on file    Forced sexual activity: Not on file  Other Topics Concern  . Not on file  Social History Narrative   Lives with brother, daughter and 2 grandchildren in a 3 story home.  Has 2 children.  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     Highest level of education:  2 years of graduate school    Current Outpatient Medications on File Prior to Visit  Medication Sig Dispense Refill  . atorvastatin (LIPITOR) 10 MG tablet TAKE 1 TABLET BY MOUTH  DAILY 90 tablet 1  . Blood Glucose Monitoring Suppl (ONE TOUCH ULTRA 2) w/Device KIT 1 each by Does not apply route 4 (four)  times daily. Use device to monitor glucose levels 4 times per day; E11.9 (Patient taking differently: 1 each by Does not apply route daily. Use device to monitor glucose levels 4 times per day; E11.9) 1 each 0  . Famotidine (PEPCID PO) 1 tablet as needed.     . gabapentin (NEURONTIN) 600 MG tablet TAKE 2 TABLETS BY MOUTH AT  BEDTIME 180 tablet 1  . hydrochlorothiazide (HYDRODIURIL) 25 MG tablet TAKE 1 TABLET BY MOUTH  DAILY 90 tablet 1  . losartan (COZAAR) 100 MG tablet TAKE 1 TABLET BY MOUTH  DAILY 90 tablet 1  . multivitamin-lutein (OCUVITE-LUTEIN) CAPS capsule Take 1 capsule by mouth daily.    . ONE TOUCH LANCETS MISC 1 Device by Other route daily. Use to test blood sugars once daily 90 each 3  . ONETOUCH ULTRA test strip USE TO CHECK BLOOD SUGAR 4  TIMES DAILY 100 each 2  . Simethicone (GAS-X PO) Take 1 tablet by mouth daily as needed (gas.).      No current facility-administered medications on file prior to visit.     Allergies  Allergen Reactions  . Farxiga [Dapagliflozin]     Constant Vaginal Infections  . Flagyl [Metronidazole] Other (See Comments)    Made really sick. Was able to tolerate dose 4/17 with zofran  . Jardiance [Empagliflozin]     Constant Vaginal infections  . Lisinopril     04/03/15 cough reported X several months  . Sulfonamide Derivatives     nausea    Family History  Problem Relation Age of Onset  . Heart attack Mother        in 41s  . Hypertension Mother   . Stroke Mother         in 69s  . Other Father        Deceased, 41  . Stroke Maternal Grandfather 50  . Heart attack Paternal Grandfather        in 68s  . Breast cancer Sister   . Healthy Brother   . Healthy Son   . Healthy Daughter   . Diabetes Neg Hx     There were no vitals taken for this visit.   Review of Systems She denies hypoglycemia.  She has slight nausea.  She has gained weight    Objective:   Physical Exam   Lab Results  Component Value Date   CREATININE 0.81  11/25/2018   BUN 15 11/25/2018   NA 137 11/25/2018  K 3.7 11/25/2018   CL 100 11/25/2018   CO2 28 11/25/2018      Assessment & Plan:  Type 2 DM, with PN: she needs increased rx.    Patient Instructions  Please increase the repaglinide back up to 2 mg 3 times a day (just before each meal).   Please continue the same other medications.  Please come back for a follow-up appointment in 3 months.

## 2019-04-21 ENCOUNTER — Encounter: Payer: Self-pay | Admitting: Internal Medicine

## 2019-04-24 NOTE — Telephone Encounter (Signed)
Appointment has been moved to this week.

## 2019-04-26 NOTE — Patient Instructions (Addendum)
  Tests ordered today. Your results will be released to [place] (or called to you) after review.  If any changes need to be made, you will be notified at that same time.   Medications reviewed and updated.  Changes include :  Gabapentin during the day as discussed  Your prescription(s) have been submitted to your pharmacy. Please take as directed and contact our office if you believe you are having problem(s) with the medication(s).    Please followup in 6 months

## 2019-04-26 NOTE — Progress Notes (Signed)
  Subjective:    Patient ID: Heather [name], female    DOB: 01/04/1951, 68 y.o.   MRN: [id number]  HPI The patient is here for follow up.  She is not exercising regularly - yard work, house work.    Hypertension: She is taking her medication daily. She is compliant with a low sodium diet.  She denies chest pain, palpitations, edema, shortness of breath and regular headaches. She does not monitor her blood pressure at home.    Hyperlipidemia: She is taking her medication daily. She is compliant with a low fat/cholesterol diet. She denies myalgias.   Diabetes: she sees Dr Ellison. She is taking her medication daily as prescribed. She is compliant with a diabetic diet.  She monitors her sugars and they have been running 78-170.   Diabetic neuropathy:  She takes gabapentin nightly.  Her feet were only hurting at night, but now are hurting in the afternoon.  She would like to increase the gabapentin.  She is taking B12, eye vitamins.     Medications and allergies reviewed with patient and updated if appropriate.  Patient Active Problem List   Diagnosis Date Noted  . Dysuria 01/17/2019  . Acute cystitis without hematuria 10/11/2018  . Cataract 11/22/2017  . Dry cough 11/22/2017  . Nocturnal leg cramps 02/02/2017  . DOE (dyspnea on exertion) 08/04/2016  . Obese 08/04/2016  . Generalized osteoarthritis of hand 02/04/2016  . Diabetic polyneuropathy associated with diabetes mellitus due to underlying condition (HCC) 11/27/2015  . Numbness and tingling of leg 10/09/2015  . Hiatal hernia, large 08/02/2015  . Colon polyps 08/02/2015  . Anemia, unspecified 04/21/2015  . Diabetes type 2, controlled (HCC) 05/03/2014  . Hyperlipidemia, mixed 03/18/2014  . Interstitial cystitis 07/08/2012  . Essential hypertension 02/10/2011    Current Outpatient Medications on File Prior to Visit  Medication Sig Dispense Refill  . atorvastatin (LIPITOR) 10 MG tablet TAKE 1 TABLET BY MOUTH  DAILY 90  tablet 1  . Blood Glucose Monitoring Suppl (ONE TOUCH ULTRA 2) w/Device KIT 1 each by Does not apply route 4 (four) times daily. Use device to monitor glucose levels 4 times per day; E11.9 (Patient taking differently: 1 each by Does not apply route daily. Use device to monitor glucose levels 4 times per day; E11.9) 1 each 0  . Dulaglutide (TRULICITY) 1.5 MG/0.5ML SOPN Inject 1.5 mg into the skin once a week. 12 pen 5  . Famotidine (PEPCID PO) 1 tablet as needed.     . gabapentin (NEURONTIN) 600 MG tablet TAKE 2 TABLETS BY MOUTH AT  BEDTIME 180 tablet 1  . hydrochlorothiazide (HYDRODIURIL) 25 MG tablet TAKE 1 TABLET BY MOUTH  DAILY 90 tablet 1  . losartan (COZAAR) 100 MG tablet TAKE 1 TABLET BY MOUTH  DAILY 90 tablet 1  . multivitamin-lutein (OCUVITE-LUTEIN) CAPS capsule Take 1 capsule by mouth daily.    . ONE TOUCH LANCETS MISC 1 Device by Other route daily. Use to test blood sugars once daily 90 each 3  . ONETOUCH ULTRA test strip USE TO CHECK BLOOD SUGAR 4  TIMES DAILY 100 each 2  . repaglinide (PRANDIN) 2 MG tablet Take 1 tablet (2 mg total) by mouth 3 (three) times daily before meals. 270 tablet 11  . Simethicone (GAS-X PO) Take 1 tablet by mouth daily as needed (gas.).     . vitamin B-12 (CYANOCOBALAMIN) 250 MCG tablet Take by mouth.     No current facility-administered medications on file prior to visit.      Past Medical History:  Diagnosis Date  . Anemia   . Colon polyps   . Diabetes mellitus without complication (HCC)   . Environmental allergies   . GERD (gastroesophageal reflux disease)   . Heart murmur   . History of DVT (deep vein thrombosis) 1970'S  . History of hiatal hernia   . History of transfusion   . Hyperlipidemia   . Hypertension   . Interstitial cystitis    Dr Nesi  . Shortness of breath dyspnea    "due to low hemaglobin"    Past Surgical History:  Procedure Laterality Date  . APPENDECTOMY    . BILATERAL SALPINGOOPHORECTOMY     painful cysts  . COLONOSCOPY   2005   negative; Dr Medoff. F/U declined  . CYSTOSCOPY     X 4; Dr Nesi  . LAPAROSCOPIC RIGHT COLECTOMY Right 08/19/2015   Procedure: LAPAROSCOPIC ASSISTED  RIGHT COLECTOMY;  Surgeon: David Newman, MD;  Location: [facility];  Service: General;  Laterality: Right;  . PREMAGLINANT POLYPS    . TOTAL ABDOMINAL HYSTERECTOMY     metromenorrhagia    Social History   Socioeconomic History  . Marital status: Widowed    Spouse name: Not on file  . Number of children: 2  . Years of education: Not on file  . Highest education level: Not on file  Occupational History  . Not on file  Tobacco Use  . Smoking status: Former Smoker    Quit date: 05/18/1984    Years since quitting: 34.9  . Smokeless tobacco: Never Used  . Tobacco comment: smoked 1972-1986, up to 1 ppd  Substance and Sexual Activity  . Alcohol use: No    Alcohol/week: 0.0 standard drinks  . Drug use: No  . Sexual activity: Never  Other Topics Concern  . Not on file  Social History Narrative   Lives with brother, daughter and 2 grandchildren in a 3 story home.  Has 2 children.  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     Highest level of education:  2 years of graduate school   Social Determinants of Health   Financial Resource Strain: Low Risk   . Difficulty of Paying Living Expenses: Not hard at all  Food Insecurity: No Food Insecurity  . Worried About Running Out of Food in the Last Year: Never true  . Ran Out of Food in the Last Year: Never true  Transportation Needs: No Transportation Needs  . Lack of Transportation (Medical): No  . Lack of Transportation (Non-Medical): No  Physical Activity: Inactive  . Days of Exercise per Week: 0 days  . Minutes of Exercise per Session: 0 min  Stress: Stress Concern Present  . Feeling of Stress : To some extent  Social Connections: Slightly Isolated  . Frequency of Communication with Friends and Family: More than three times a week   . Frequency of Social Gatherings with Friends and Family: More than three times a week  . Attends Religious Services: 1 to 4 times per year  . Active Member of Clubs or Organizations: Yes  . Attends Club or Organization Meetings: More than 4 times per year  . Marital Status: Widowed    Family History  Problem Relation Age of Onset  . Heart attack Mother        in 70s  . Hypertension Mother   . Stroke Mother         in 70s  .   Other Father        Deceased, 82  . Stroke Maternal Grandfather 50  . Heart attack Paternal Grandfather        in 60s  . Breast cancer Sister   . Healthy Brother   . Healthy Son   . Healthy Daughter   . Diabetes Neg Hx     Review of Systems  Constitutional: Negative for fever.  Respiratory: Positive for cough (dry chronic) and shortness of breath (chronic). Negative for wheezing.   Cardiovascular: Negative for chest pain, palpitations and leg swelling.  Neurological: Positive for headaches. Negative for light-headedness.       Objective:   Vitals:   04/27/19 0949  BP: 128/80  Pulse: 65  Temp: 98.1 F (36.7 C)  SpO2: 98%   BP Readings from Last 3 Encounters:  04/27/19 128/80  02/10/19 138/82  01/16/19 120/80   Wt Readings from Last 3 Encounters:  04/27/19 205 lb 1.9 oz (93 kg)  02/10/19 203 lb 12.8 oz (92.4 kg)  01/16/19 200 lb (90.7 kg)   Body mass index is 40.06 kg/m.   Physical Exam    Constitutional: Appears well-developed and well-nourished. No distress.  HENT:  Head: Normocephalic and atraumatic.  Neck: Neck supple. No tracheal deviation present. No thyromegaly present.  No cervical lymphadenopathy Cardiovascular: Normal rate, regular rhythm and normal heart sounds.   2/6 systolic murmur heard. No carotid bruit .  No edema Pulmonary/Chest: Effort normal and breath sounds normal. No respiratory distress. No has no wheezes. No rales.  Skin: Skin is warm and dry. Not diaphoretic.  Psychiatric: Normal mood and affect. Behavior  is normal.      Assessment & Plan:    See Problem List for Assessment and Plan of chronic medical problems.    This visit occurred during the SARS-CoV-2 public health emergency.  Safety protocols were in place, including screening questions prior to the visit, additional usage of staff PPE, and extensive cleaning of exam room while observing appropriate contact time as indicated for disinfecting solutions.     

## 2019-04-27 ENCOUNTER — Encounter: Payer: Self-pay | Admitting: Internal Medicine

## 2019-04-27 ENCOUNTER — Ambulatory Visit (INDEPENDENT_AMBULATORY_CARE_PROVIDER_SITE_OTHER): Payer: Medicare Other | Admitting: Internal Medicine

## 2019-04-27 ENCOUNTER — Other Ambulatory Visit: Payer: Self-pay

## 2019-04-27 ENCOUNTER — Other Ambulatory Visit (INDEPENDENT_AMBULATORY_CARE_PROVIDER_SITE_OTHER): Payer: Medicare Other

## 2019-04-27 VITALS — BP 128/80 | HR 65 | Temp 98.1°F | Ht 60.0 in | Wt 205.1 lb

## 2019-04-27 DIAGNOSIS — E1142 Type 2 diabetes mellitus with diabetic polyneuropathy: Secondary | ICD-10-CM

## 2019-04-27 DIAGNOSIS — I10 Essential (primary) hypertension: Secondary | ICD-10-CM

## 2019-04-27 DIAGNOSIS — E782 Mixed hyperlipidemia: Secondary | ICD-10-CM | POA: Diagnosis not present

## 2019-04-27 DIAGNOSIS — E0842 Diabetes mellitus due to underlying condition with diabetic polyneuropathy: Secondary | ICD-10-CM

## 2019-04-27 LAB — LIPID PANEL
Cholesterol: 188 mg/dL (ref 0–200)
HDL: 55.4 mg/dL (ref >39.00)
NonHDL: 132.97
Total CHOL/HDL Ratio: 3
Triglycerides: 212 mg/dL — ABNORMAL HIGH (ref 0.0–149.0)
VLDL: 42.4 mg/dL — ABNORMAL HIGH (ref 0.0–40.0)

## 2019-04-27 LAB — CBC WITH DIFFERENTIAL/PLATELET
Basophils Absolute: 0 10*3/uL (ref 0.0–0.1)
Basophils Relative: 0.6 % (ref 0.0–3.0)
Eosinophils Absolute: 0.2 10*3/uL (ref 0.0–0.7)
Eosinophils Relative: 2.6 % (ref 0.0–5.0)
HCT: 36.3 % (ref 36.0–46.0)
Hemoglobin: 11.8 g/dL — ABNORMAL LOW (ref 12.0–15.0)
Lymphocytes Relative: 25.3 % (ref 12.0–46.0)
Lymphs Abs: 2 10*3/uL (ref 0.7–4.0)
MCHC: 32.4 g/dL (ref 30.0–36.0)
MCV: 72 fl — ABNORMAL LOW (ref 78.0–100.0)
Monocytes Absolute: 0.7 10*3/uL (ref 0.1–1.0)
Monocytes Relative: 9 % (ref 3.0–12.0)
Neutro Abs: 4.9 10*3/uL (ref 1.4–7.7)
Neutrophils Relative %: 62.5 % (ref 43.0–77.0)
Platelets: 381 10*3/uL (ref 150.0–400.0)
RBC: 5.04 Mil/uL (ref 3.87–5.11)
RDW: 16.3 % — ABNORMAL HIGH (ref 11.5–15.5)
WBC: 7.8 10*3/uL (ref 4.0–10.5)

## 2019-04-27 LAB — LDL CHOLESTEROL, DIRECT: Direct LDL: 108 mg/dL

## 2019-04-27 LAB — COMPREHENSIVE METABOLIC PANEL
ALT: 24 U/L (ref 0–35)
AST: 22 U/L (ref 0–37)
Albumin: 4.4 g/dL (ref 3.5–5.2)
Alkaline Phosphatase: 59 U/L (ref 39–117)
BUN: 14 mg/dL (ref 6–23)
CO2: 26 mEq/L (ref 19–32)
Calcium: 9.8 mg/dL (ref 8.4–10.5)
Chloride: 99 mEq/L (ref 96–112)
Creatinine, Ser: 0.91 mg/dL (ref 0.40–1.20)
GFR: 61.02 mL/min (ref >60.00)
Glucose, Bld: 105 mg/dL — ABNORMAL HIGH (ref 70–99)
Potassium: 4.4 mEq/L (ref 3.5–5.1)
Sodium: 135 mEq/L (ref 135–145)
Total Bilirubin: 1 mg/dL (ref 0.2–1.2)
Total Protein: 7.5 g/dL (ref 6.0–8.3)

## 2019-04-27 LAB — HEMOGLOBIN A1C: Hgb A1c MFr Bld: 7.2 % — ABNORMAL HIGH (ref 4.6–6.5)

## 2019-04-27 LAB — VITAMIN B12: Vitamin B-12: >1500 pg/mL — ABNORMAL HIGH (ref 211–911)

## 2019-04-27 MED ORDER — GABAPENTIN 100 MG PO CAPS: ORAL_CAPSULE | ORAL | 3 refills | Status: DC

## 2019-04-27 NOTE — Assessment & Plan Note (Signed)
Check lipid panel  Continue daily statin Regular exercise and healthy diet encouraged  

## 2019-04-27 NOTE — Assessment & Plan Note (Signed)
BP well controlled Current regimen effective and well tolerated Continue current medications at current doses cmp  

## 2019-04-27 NOTE — Assessment & Plan Note (Addendum)
Not controlled Having pain in afternoon now, not just at night Continue 1200 mg gabapentin at night Start 100 mg qd-bid and increase if needed - discussed side effects  check B12, B3

## 2019-04-27 NOTE — Assessment & Plan Note (Addendum)
Following with Dr Loanne Drilling sugars variable Will check a1c

## 2019-05-02 ENCOUNTER — Encounter: Payer: Self-pay | Admitting: Internal Medicine

## 2019-05-03 ENCOUNTER — Encounter: Payer: Self-pay | Admitting: Internal Medicine

## 2019-05-03 LAB — VITAMIN B3
Nicotinamide: <20 ng/mL
Nicotinic Acid: <20 ng/mL

## 2019-05-17 ENCOUNTER — Ambulatory Visit: Payer: Medicare Other

## 2019-05-31 ENCOUNTER — Ambulatory Visit: Payer: Medicare Other | Admitting: Internal Medicine

## 2019-06-06 ENCOUNTER — Ambulatory Visit: Payer: Medicare Other | Attending: Internal Medicine

## 2019-06-06 DIAGNOSIS — Z23 Encounter for immunization: Secondary | ICD-10-CM | POA: Insufficient documentation

## 2019-06-06 NOTE — Progress Notes (Signed)
   Covid-19 Vaccination Clinic  Name:  Heather [name]    MRN: [id number] DOB: [birth year]  06/06/2019  Heather [name] was observed post Covid-19 immunization for 15 minutes without incidence. She was provided with Vaccine Information Sheet and instruction to access the V-Safe system.   Heather [name] was instructed to call 911 with any severe reactions post vaccine: Marland Kitchen Difficulty breathing  . Swelling of your face and throat  . A fast heartbeat  . A bad rash all over your body  . Dizziness and weakness    Immunizations Administered    Name Date Dose VIS Date Route   Pfizer COVID-19 Vaccine 06/06/2019  3:33 PM 0.3 mL 04/28/2019 Intramuscular   Manufacturer: [place]   Lot: [id number]   [place]: [id number]

## 2019-06-09 ENCOUNTER — Ambulatory Visit (INDEPENDENT_AMBULATORY_CARE_PROVIDER_SITE_OTHER): Payer: Medicare Other | Admitting: Endocrinology

## 2019-06-09 ENCOUNTER — Other Ambulatory Visit: Payer: Self-pay

## 2019-06-09 ENCOUNTER — Encounter: Payer: Self-pay | Admitting: Endocrinology

## 2019-06-09 DIAGNOSIS — E1142 Type 2 diabetes mellitus with diabetic polyneuropathy: Secondary | ICD-10-CM

## 2019-06-09 MED ORDER — ONETOUCH ULTRA VI STRP: 1.0000 | ORAL_STRIP | Freq: Every day | 3 refills | Status: DC

## 2019-06-09 MED ORDER — REPAGLINIDE 2 MG PO TABS: 2.0000 mg | ORAL_TABLET | Freq: Three times a day (TID) | ORAL | 11 refills | Status: DC

## 2019-06-09 MED ORDER — TRULICITY 3 MG/0.5ML ~~LOC~~ SOAJ: 3.0000 mg | SUBCUTANEOUS | 3 refills | Status: DC

## 2019-06-09 NOTE — Progress Notes (Signed)
Subjective:    Patient ID: Heather [name], female    DOB: 1950-10-25, 69 y.o.   MRN: [id number]  HPI telehealth visit today via telephone x 9 minutes.  Alternatives to telehealth are presented to this patient, and the patient agrees to the telehealth visit.  Pt is advised of the cost of the visit, and agrees to this, also.   Patient is at home, and I am at the office.   Persons attending the telehealth visit: the patient and I.   Pt returns for f/u of diabetes mellitus: DM type: 2 Dx'ed: 1962 Complications: polyneuropathy.   Therapy: trulicity and repaglinide.   GDM: never DKA: never Severe hypoglycemia: never.  Pancreatitis: never Other: she has never been on insulin; short bowel syndrome limits metformin dosage; she did not tolerate [place] or Jardiance (vaginitis); edema precludes pioglitazone.   Interval history: pt states she feels well in general.  She says fasting cbg's vary from 148-161.   Past Medical History:  Diagnosis Date  . Anemia   . Colon polyps   . Diabetes mellitus without complication ([place])   . Environmental allergies   . GERD (gastroesophageal reflux disease)   . Heart murmur   . History of DVT (deep vein thrombosis) 1970'S  . History of hiatal hernia   . History of transfusion   . Hyperlipidemia   . Hypertension   . Interstitial cystitis    Dr Janice Norrie  . Shortness of breath dyspnea    "due to low hemaglobin"    Past Surgical History:  Procedure Laterality Date  . APPENDECTOMY    . BILATERAL SALPINGOOPHORECTOMY     painful cysts  . COLONOSCOPY  2005   negative; Dr Earlean Shawl. F/U declined  . CYSTOSCOPY     X 4; Dr Janice Norrie  . LAPAROSCOPIC RIGHT COLECTOMY Right 08/19/2015   Procedure: LAPAROSCOPIC ASSISTED  RIGHT COLECTOMY;  Surgeon: Alphonsa Overall, MD;  Location: [facility];  Service: General;  Laterality: Right;  . PREMAGLINANT POLYPS    . TOTAL ABDOMINAL HYSTERECTOMY     metromenorrhagia    Social History   Socioeconomic History  . Marital status:  Widowed    Spouse name: Not on file  . Number of children: 2  . Years of education: Not on file  . Highest education level: Not on file  Occupational History  . Not on file  Tobacco Use  . Smoking status: Former Smoker    Quit date: 05/18/1984    Years since quitting: 35.0  . Smokeless tobacco: Never Used  . Tobacco comment: smoked 1972-1986, up to 1 ppd  Substance and Sexual Activity  . Alcohol use: No    Alcohol/week: 0.0 standard drinks  . Drug use: No  . Sexual activity: Never  Other Topics Concern  . Not on file  Social History Narrative   Lives with brother, daughter and 2 grandchildren in a 3 story home.  Has 2 children.  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     Highest level of education:  2 years of graduate school   Social Determinants of Health   Financial Resource Strain:   . Difficulty of Paying Living Expenses: Not on file  Food Insecurity:   . Worried About Charity fundraiser in the Last Year: Not on file  . Ran Out of Food in the Last Year: Not on file  Transportation Needs:   . Lack of Transportation (Medical): Not on file  .  Lack of Transportation (Non-Medical): Not on file  Physical Activity:   . Days of Exercise per Week: Not on file  . Minutes of Exercise per Session: Not on file  Stress:   . Feeling of Stress : Not on file  Social Connections:   . Frequency of Communication with Friends and Family: Not on file  . Frequency of Social Gatherings with Friends and Family: Not on file  . Attends Religious Services: Not on file  . Active Member of Clubs or Organizations: Not on file  . Attends Archivist Meetings: Not on file  . Marital Status: Not on file  Intimate Partner Violence:   . Fear of Current or Ex-Partner: Not on file  . Emotionally Abused: Not on file  . Physically Abused: Not on file  . Sexually Abused: Not on file    Current Outpatient Medications on File Prior to Visit    Medication Sig Dispense Refill  . atorvastatin (LIPITOR) 10 MG tablet TAKE 1 TABLET BY MOUTH  DAILY 90 tablet 1  . Blood Glucose Monitoring Suppl (ONE TOUCH ULTRA 2) w/Device KIT 1 each by Does not apply route 4 (four) times daily. Use device to monitor glucose levels 4 times per day; E11.9 (Patient taking differently: 1 each by Does not apply route daily. Use device to monitor glucose levels 4 times per day; E11.9) 1 each 0  . Famotidine (PEPCID PO) 1 tablet as needed.     . gabapentin (NEURONTIN) 100 MG capsule Start 100 mg in morning and afternoon - increase to 300 mg BID if tolerated 180 capsule 3  . gabapentin (NEURONTIN) 600 MG tablet TAKE 2 TABLETS BY MOUTH AT  BEDTIME 180 tablet 1  . hydrochlorothiazide (HYDRODIURIL) 25 MG tablet TAKE 1 TABLET BY MOUTH  DAILY 90 tablet 1  . losartan (COZAAR) 100 MG tablet TAKE 1 TABLET BY MOUTH  DAILY 90 tablet 1  . multivitamin-lutein (OCUVITE-LUTEIN) CAPS capsule Take 1 capsule by mouth daily.    . ONE TOUCH LANCETS MISC 1 Device by Other route daily. Use to test blood sugars once daily 90 each 3  . Simethicone (GAS-X PO) Take 1 tablet by mouth daily as needed (gas.).     Marland Kitchen vitamin B-12 (CYANOCOBALAMIN) 250 MCG tablet Take by mouth.     No current facility-administered medications on file prior to visit.    Allergies  Allergen Reactions  . Farxiga [Dapagliflozin]     Constant Vaginal Infections  . Flagyl [Metronidazole] Other (See Comments)    Made really sick. Was able to tolerate dose 4/17 with zofran  . Jardiance [Empagliflozin]     Constant Vaginal infections  . Lisinopril     04/03/15 cough reported X several months  . Cholestyramine Other (See Comments)    Joint pain  . Sulfonamide Derivatives     nausea    Family History  Problem Relation Age of Onset  . Heart attack Mother        in 78s  . Hypertension Mother   . Stroke Mother         in 59s  . Other Father        Deceased, 75  . Stroke Maternal Grandfather 50  . Heart  attack Paternal Grandfather        in 13s  . Breast cancer Sister   . Healthy Brother   . Healthy Son   . Healthy Daughter   . Diabetes Neg Hx     There were no vitals  taken for this visit.   Review of Systems She denies hypoglycemia and nausea.     Objective:   Physical Exam   Lab Results  Component Value Date   HGBA1C 7.2 (H) 04/27/2019       Assessment & Plan:  Type 2 DM, with PN: worse Edema: This limits rx options   Patient Instructions  I have sent a prescription to your pharmacy, to increase the Trulicity, and: Please continue the same repaglinide. check your blood sugar once a day.  vary the time of day when you check, between before the 3 meals, and at bedtime.  also check if you have symptoms of your blood sugar being too high or too low.  please keep a record of the readings and bring it to your next appointment here (or you can bring the meter itself).  You can write it on any piece of paper.  please call us sooner if your blood sugar goes below 70, or if you have a lot of readings over 200. Please come back for a follow-up appointment in 3 months.

## 2019-06-09 NOTE — Patient Instructions (Addendum)
I have sent a prescription to your pharmacy, to increase the Trulicity, and: Please continue the same repaglinide. check your blood sugar once a day.  vary the time of day when you check, between before the 3 meals, and at bedtime.  also check if you have symptoms of your blood sugar being too high or too low.  please keep a record of the readings and bring it to your next appointment here (or you can bring the meter itself).  You can write it on any piece of paper.  please call us sooner if your blood sugar goes below 70, or if you have a lot of readings over 200. Please come back for a follow-up appointment in 3 months.

## 2019-06-12 ENCOUNTER — Other Ambulatory Visit: Payer: Self-pay | Admitting: Internal Medicine

## 2019-06-12 DIAGNOSIS — I10 Essential (primary) hypertension: Secondary | ICD-10-CM

## 2019-06-24 ENCOUNTER — Ambulatory Visit: Payer: Medicare Other | Attending: Internal Medicine

## 2019-06-24 DIAGNOSIS — Z23 Encounter for immunization: Secondary | ICD-10-CM | POA: Insufficient documentation

## 2019-06-24 NOTE — Progress Notes (Signed)
   Covid-19 Vaccination Clinic  Name:  Heather [name]    MRN: [id number] DOB: [birth year]  06/24/2019  Heather [name] was observed post Covid-19 immunization for 15 minutes without incidence. She was provided with Vaccine Information Sheet and instruction to access the V-Safe system.   Heather [name] was instructed to call 911 with any severe reactions post vaccine: Marland Kitchen Difficulty breathing  . Swelling of your face and throat  . A fast heartbeat  . A bad rash all over your body  . Dizziness and weakness    Immunizations Administered    Name Date Dose VIS Date Route   Pfizer COVID-19 Vaccine 06/24/2019  9:53 AM 0.3 mL 04/28/2019 Intramuscular   Manufacturer: [place]   Lot: [id number]   [place]: [id number]

## 2019-06-27 ENCOUNTER — Other Ambulatory Visit: Payer: Self-pay

## 2019-06-27 ENCOUNTER — Encounter: Payer: Self-pay | Admitting: Internal Medicine

## 2019-06-27 DIAGNOSIS — I10 Essential (primary) hypertension: Secondary | ICD-10-CM

## 2019-06-27 DIAGNOSIS — E1142 Type 2 diabetes mellitus with diabetic polyneuropathy: Secondary | ICD-10-CM

## 2019-06-27 DIAGNOSIS — E782 Mixed hyperlipidemia: Secondary | ICD-10-CM

## 2019-06-27 DIAGNOSIS — E0842 Diabetes mellitus due to underlying condition with diabetic polyneuropathy: Secondary | ICD-10-CM

## 2019-06-27 MED ORDER — GABAPENTIN 100 MG PO CAPS: ORAL_CAPSULE | ORAL | 0 refills | Status: DC

## 2019-06-27 MED ORDER — GABAPENTIN 600 MG PO TABS: 1200.0000 mg | ORAL_TABLET | Freq: Every day | ORAL | 1 refills | Status: DC

## 2019-07-04 DIAGNOSIS — H5213 Myopia, bilateral: Secondary | ICD-10-CM | POA: Diagnosis not present

## 2019-07-04 DIAGNOSIS — H2513 Age-related nuclear cataract, bilateral: Secondary | ICD-10-CM | POA: Diagnosis not present

## 2019-07-04 DIAGNOSIS — H52223 Regular astigmatism, bilateral: Secondary | ICD-10-CM | POA: Diagnosis not present

## 2019-07-04 DIAGNOSIS — H25013 Cortical age-related cataract, bilateral: Secondary | ICD-10-CM | POA: Diagnosis not present

## 2019-07-04 DIAGNOSIS — E119 Type 2 diabetes mellitus without complications: Secondary | ICD-10-CM | POA: Diagnosis not present

## 2019-07-04 LAB — HM DIABETES EYE EXAM

## 2019-07-11 ENCOUNTER — Encounter: Payer: Self-pay | Admitting: Internal Medicine

## 2019-07-25 DIAGNOSIS — H2513 Age-related nuclear cataract, bilateral: Secondary | ICD-10-CM | POA: Diagnosis not present

## 2019-07-25 DIAGNOSIS — H25013 Cortical age-related cataract, bilateral: Secondary | ICD-10-CM | POA: Diagnosis not present

## 2019-07-25 DIAGNOSIS — H25043 Posterior subcapsular polar age-related cataract, bilateral: Secondary | ICD-10-CM | POA: Diagnosis not present

## 2019-07-25 DIAGNOSIS — H2511 Age-related nuclear cataract, right eye: Secondary | ICD-10-CM | POA: Diagnosis not present

## 2019-07-25 DIAGNOSIS — H18413 Arcus senilis, bilateral: Secondary | ICD-10-CM | POA: Diagnosis not present

## 2019-08-04 DIAGNOSIS — H2511 Age-related nuclear cataract, right eye: Secondary | ICD-10-CM | POA: Diagnosis not present

## 2019-08-04 DIAGNOSIS — H2512 Age-related nuclear cataract, left eye: Secondary | ICD-10-CM | POA: Diagnosis not present

## 2019-08-28 ENCOUNTER — Other Ambulatory Visit: Payer: Self-pay | Admitting: Internal Medicine

## 2019-08-28 DIAGNOSIS — E782 Mixed hyperlipidemia: Secondary | ICD-10-CM

## 2019-08-28 DIAGNOSIS — E0842 Diabetes mellitus due to underlying condition with diabetic polyneuropathy: Secondary | ICD-10-CM

## 2019-08-28 DIAGNOSIS — E1142 Type 2 diabetes mellitus with diabetic polyneuropathy: Secondary | ICD-10-CM

## 2019-08-28 DIAGNOSIS — I10 Essential (primary) hypertension: Secondary | ICD-10-CM

## 2019-09-01 ENCOUNTER — Telehealth: Payer: Self-pay | Admitting: Internal Medicine

## 2019-09-01 DIAGNOSIS — E1142 Type 2 diabetes mellitus with diabetic polyneuropathy: Secondary | ICD-10-CM

## 2019-09-01 NOTE — Chronic Care Management (AMB) (Signed)
  Chronic Care Management   Note  09/01/2019 Name: Heather [name] MRN: [id number] DOB: 1950/12/25  Heather [name] is a 69 y.o. year old female who is a primary care patient of Burns, [name], MD. I reached out to Heather [name] by phone today in response to a referral sent by [name]'s PCP, Binnie Rail, MD.   Heather [name] was given information about Chronic Care Management services today including:  1. CCM service includes personalized support from designated clinical staff supervised by her physician, including individualized plan of care and coordination with other care providers 2. 24/7 contact phone numbers for assistance for urgent and routine care [name]. 3. Service will only be billed when office clinical staff spend 20 minutes or more in a month to coordinate care. 4. Only one practitioner may furnish and bill the service in a calendar month. 5. The patient may stop CCM services at any time (effective at the end of the month) by phone call to the office staff.   Patient agreed to services and verbal consent obtained.   Follow up plan:   Heather [name]

## 2019-09-11 ENCOUNTER — Other Ambulatory Visit: Payer: Self-pay | Admitting: Internal Medicine

## 2019-09-11 DIAGNOSIS — E0842 Diabetes mellitus due to underlying condition with diabetic polyneuropathy: Secondary | ICD-10-CM

## 2019-09-11 DIAGNOSIS — H2512 Age-related nuclear cataract, left eye: Secondary | ICD-10-CM | POA: Diagnosis not present

## 2019-09-11 DIAGNOSIS — E782 Mixed hyperlipidemia: Secondary | ICD-10-CM

## 2019-09-11 DIAGNOSIS — I10 Essential (primary) hypertension: Secondary | ICD-10-CM

## 2019-09-11 DIAGNOSIS — E1142 Type 2 diabetes mellitus with diabetic polyneuropathy: Secondary | ICD-10-CM

## 2019-09-18 DIAGNOSIS — H25012 Cortical age-related cataract, left eye: Secondary | ICD-10-CM | POA: Diagnosis not present

## 2019-09-25 NOTE — Addendum Note (Signed)
Addended by: Karle Barr on: 09/25/2019 08:03 AM   Modules accepted: Orders

## 2019-09-26 ENCOUNTER — Telehealth: Payer: Self-pay | Admitting: Internal Medicine

## 2019-09-26 DIAGNOSIS — E1142 Type 2 diabetes mellitus with diabetic polyneuropathy: Secondary | ICD-10-CM

## 2019-09-27 ENCOUNTER — Telehealth: Payer: Medicare Other

## 2019-09-28 NOTE — Progress Notes (Signed)
ERROR °This note is not being shared with the patient for the following reason: °To respect privacy (The patient or proxy has requested that the information not be shared).  °Eileen Collins °Upstream Scheduler °

## 2019-10-03 NOTE — Chronic Care Management (AMB) (Signed)
Chronic Care Management Pharmacy  Name: Heather [name]  MRN: [id number] DOB: 1950/09/14  Chief Complaint/ HPI  Heather [name],  69 y.o. , female presents for their Initial CCM visit with the clinical pharmacist via telephone due to COVID-19 Pandemic.  PCP : Binnie Rail, MD  Their chronic conditions include: HTN, T2DM, osteoarthritis, HLD  Pt is a widow, husband died with liver complications. She reports sudden joint pain in ~January. Started R side, now L side. -taking Advil, up to 3 at a time. Worried about liver.  Office Visits: 04/27/19 Dr Quay Burow OV: uncontrolled neuropathy. Start gabapentin 100 mg and increased if needed. May decreased B12 supplement due to high-normal lab results. Rec'd Vitamin B complex for neuropathy as well.  Consult Visit: 06/09/19 Dr Loanne Drilling (endocrine): short bowel syndrome limits metformin usage; did not tolerate SGLT2 (vaginitis); edema precludes pioglitazone. Increased Trulicity 1.5 - 3 mg weekly.  Allergies  Allergen Reactions  . Farxiga [Dapagliflozin]     Constant Vaginal Infections  . Flagyl [Metronidazole] Other (See Comments)    Made really sick. Was able to tolerate dose 4/17 with zofran  . Jardiance [Empagliflozin]     Constant Vaginal infections  . Lisinopril     04/03/15 cough reported X several months  . Cholestyramine Other (See Comments)    Joint pain  . Sulfonamide Derivatives     nausea    Medications: Outpatient Encounter Medications as of 10/04/2019  Medication Sig  . atorvastatin (LIPITOR) 10 MG tablet TAKE 1 TABLET BY MOUTH EVERY DAY  . Blood Glucose Monitoring Suppl (ONE TOUCH ULTRA 2) w/Device KIT 1 each by Does not apply route 4 (four) times daily. Use device to monitor glucose levels 4 times per day; E11.9 (Patient taking differently: 1 each by Does not apply route daily. Use device to monitor glucose levels 4 times per day; E11.9)  . Cyanocobalamin 2500 MCG TABS Take 2,500 mcg by mouth daily.   . Dulaglutide  (TRULICITY) 3 MN/8.1RR SOPN Inject 3 mg into the skin once a week.  . Famotidine (PEPCID PO) 1 tablet as needed.   . gabapentin (NEURONTIN) 100 MG capsule START WITH 1 CAPSULE IN MORNING AND AFTERNOON, THEN INCREASE TO 3 CAPSULES TWICE A DAY IF TOLERATED  . gabapentin (NEURONTIN) 600 MG tablet Take 2 tablets (1,200 mg total) by mouth at bedtime.  Marland Kitchen glucose blood (ONETOUCH ULTRA) test strip 1 each by Other route daily. And lancets 1/day  . hydrochlorothiazide (HYDRODIURIL) 25 MG tablet TAKE 1 TABLET BY MOUTH EVERY DAY  . losartan (COZAAR) 100 MG tablet TAKE 1 TABLET BY MOUTH EVERY DAY  . multivitamin-lutein (OCUVITE-LUTEIN) CAPS capsule Take 1 capsule by mouth daily.  . ONE TOUCH LANCETS MISC 1 Device by Other route daily. Use to test blood sugars once daily  . repaglinide (PRANDIN) 2 MG tablet Take 1 tablet (2 mg total) by mouth 3 (three) times daily before meals.  . Simethicone (GAS-X PO) Take 1 tablet by mouth daily as needed (gas.).    No facility-administered encounter medications on file as of 10/04/2019.     Current Diagnosis/Assessment:  SDOH Interventions     Most Recent Value  SDOH Interventions  SDOH Interventions for the Following Domains  Financial Strain  Financial Strain Interventions  Other (Comment) [patient has LIS extra help for rx's]     Goals Addressed            This Visit's Progress   . Pharmacy Care Plan  CARE PLAN ENTRY  Current Barriers:  . Chronic Disease Management support, education, and care coordination [name] related to Hypertension, Hyperlipidemia, and Diabetes   Hypertension . Pharmacist Clinical Goal(s): o Over the next 30 days, patient will work with PharmD and providers to maintain BP goal <130/80 . Current regimen:  o Losartan 100 mg daily o HCTZ 25 mg daily . Interventions: o Discussed benefits of monitoring BP at home . Patient self care activities - Over the next 30 days, patient will: o Check BP 1-2 times weekly, document, and  provide at future appointments o Ensure daily salt intake < 2300 mg/day  Hyperlipidemia Lipid Panel     Component Value Date/Time   CHOL 188 04/27/2019 1040   TRIG 212.0 (H) 04/27/2019 1040   HDL 55.40 04/27/2019 1040   LDLDIRECT 108.0 04/27/2019 1040 .  Pharmacist Clinical Goal(s): o Over the next 30 days, patient will work with PharmD and providers to achieve LDL goal < 100 . Current regimen:  o Atorvastatin 10 mg daily . Interventions: o Discussed cholesterol goals and benefits of statin regarding cardiovascular risk reduction o Discussed effects of statin on liver function; as long as liver function remains normal, the risk of liver damage is extremely low . Patient self care activities - Over the next 30 days, patient will: o Continue atorvastatin as prescribed o Continue low cholesterol diet  Diabetes . Pharmacist Clinical Goal(s): o Over the next 30 days, patient will work with PharmD and providers to achieve A1c goal <7% . Current regimen:  o Trulicity 3 mg once weekly o Repaglinide 2 mg TID before meals . Interventions: o Discussed need for medication that addresses elevated fasting blood sugar o Recommend to start insulin (Lantus 10 units, titrated to fasting BG < 130) . Patient self care activities - Over the next 30 days, patient will: o Check blood sugar once daily and in the morning before eating or drinking, document, and provide at future appointments o Contact provider with any episodes of hypoglycemia o Pending PCP approval, begin Lantus 10 units once daily and bring insulin to office for pharmacist education  Joint Pain . Pharmacist Clinical Goal(s) o Over the next 30 days, patient will work with PharmD and providers to optimize OTC pain relievers . Current regimen:  o Ibuprofen up to 600 mg per day . Interventions: o Recommended Tylenol Extra Strength (500 mg) - 2 tablets up to 3 times daily for joint pain . Patient self care activities - Over the next  30 days, patient will: o Stop ibuprofen (Advil) and begin Tylenol as directed above o Contact PCP if joint pain worsens  Medication management . Pharmacist Clinical Goal(s): o Over the next 30 days, patient will work with PharmD and providers to achieve optimal medication adherence . [facility] . Interventions o Comprehensive medication review performed. o Utilize [facility] for medication synchronization, packaging and delivery . Patient self care activities - Over the next 30 days, patient will: o Focus on medication adherence by pill box o Take medications as prescribed o Report any questions or concerns to PharmD and/or provider(s)  Initial goal documentation        Hypertension   Office blood pressures are  BP Readings from Last 3 Encounters:  04/27/19 128/80  02/10/19 138/82  01/16/19 120/80   Kidney Function Lab Results  Component Value Date/Time   CREATININE 0.91 04/27/2019 10:40 AM   CREATININE 0.81 11/25/2018 08:49 AM   GFR 61.02 04/27/2019 10:40 AM   GFRNONAA >  60 08/21/2015 04:09 AM   GFRAA >60 08/21/2015 04:09 AM   Patient has failed these meds in the past: n/a Patient is currently controlled on the following medications:   Losartan 100 mg daily  HCTZ 25 mg daily  Patient checks BP at home 1-2x per week  Patient home BP readings are ranging: 130/80s  We discussed diet and exercise extensively; BP goals.  Plan  Continue current medications and control with diet and exercise     Diabetes   Recent Relevant Labs: Lab Results  Component Value Date/Time   HGBA1C 7.2 (H) 04/27/2019 10:40 AM   HGBA1C 6.8 (H) 11/25/2018 08:49 AM   HGBA1C 6.7 04/21/2018 08:04 AM   GFR 61.02 04/27/2019 10:40 AM   GFR 69.87 11/25/2018 08:49 AM   MICROALBUR 3.5 (H) 02/04/2016 10:23 AM   MICROALBUR 0.8 11/30/2014 03:56 PM    Checking BG: Daily  Recent FBG Readings:  173 (10/04/19) 145 171 133 145 171 171 113 (09/27/19)  One HS reading of  200, does not remember date.  Patient has failed these meds in past: Farxiga, Jardiance (mycotic infections), metformin, nateglinide, Januvia Patient is currently uncontrolled on the following medications:   Trulicity 3 mg once weekly (Wednesday) - just refilled  Repaglinide 2 mg TID before meals  Last diabetic Eye exam:  Lab Results  Component Value Date/Time   HMDIABEYEEXA No Retinopathy 07/04/2019 12:00 AM    Last diabetic Foot exam: No results found for: HMDIABFOOTEX   We discussed: diet and exercise extensively; very difficult to lose weight since colon surgery. Pt has high fasting BG for past 2 months at least, coincides with new joint pain that started around January; pt denies changes in diet. Offered to schedule appt with PCP sooner but pt does not want to be a burden and wants to keep current appt for ~1 month from now.  It is worrisome that BG has increased despite increased dose of Trulicity in January. Currently patient's regimen targets post-prandial BG, she [name] something to control fasting BG - cannot take metformin, SGLT2, or Actos. May consider glipizide or basal insulin to control fasting BG.   Plan  Continue current medications and control with diet and exercise  Recommend basal insulin (Lantus) 10 units to start OR glipizide 2.5 mg before breakfast  Hyperlipidemia   Lipid Panel     Component Value Date/Time   CHOL 188 04/27/2019 1040   CHOL 333 (H) 03/15/2014 0810   TRIG 212.0 (H) 04/27/2019 1040   TRIG 263 (H) 03/15/2014 0810   HDL 55.40 04/27/2019 1040   HDL 63 03/15/2014 0810   CHOLHDL 3 04/27/2019 1040   VLDL 42.4 (H) 04/27/2019 1040   LDLCALC 92 05/25/2018 0832   LDLCALC 217 (H) 03/15/2014 0810   LDLDIRECT 108.0 04/27/2019 1040    The 10-year ASCVD risk score Mikey Bussing DC Jr., et al., 2013) is: 24.9%   Values used to calculate the score:     Age: 71 years     Sex: Female     Is Non-Hispanic African American: No     Diabetic: Yes     Tobacco  smoker: No     Systolic Blood Pressure: 947 mmHg     Is BP treated: Yes     HDL Cholesterol: 55.4 mg/dL     Total Cholesterol: 188 mg/dL   Patient has failed these meds in past: n/a Patient is currently uncontrolled on the following medications:   Atorvastatin 10 mg daily   We discussed:  diet  and exercise extensively; cholesterol goals and benefit of statin for ASCVD risk reduction.  Plan  Continue current medications and control with diet and exercise  Neuropathy   Patient has failed these meds in past: n/a Patient is currently controlled on the following medications:   Gabapentin 600 mg - 2 tablets HS  Gabapentin 100 mg - up to 3 capsules BID  We discussed:  Does not take 100 mg every day, usually just takes 1 if she [name] it.   Plan  Continue current medications  Joint pain   Patient has failed these meds in past: n/a Patient is currently uncontrolled on the following medications:   Ibuprofen 200-600 mg as needed  We discussed:  Joint pain affecting several joins, including knees, hips, shoulders. Ibuprofen has helped a little. Discussed Tylenol is treatment of choice for osteoarthritis, recommended 1000 mg up to TID. Also recommended patient discuss this problem with PCP, further workup is warranted to determine source of joint pain.  Plan  Continue current medications  Follow up with PCP regarding source of joint pain (appt in June)  GERD   Patient has failed these meds in past: n/a Patient is currently controlled on the following medications:   Famotidine 20 mg PRN  Tums PRN  We discussed:  Pt takes famotidine almost daily, extra tums as needed. She reports trigger foods: apples, bananas, beans.   Plan  Continue current medications  Post-op eye surgery   Patient has failed these meds in past: n/a Patient is currently controlled on the following medications:   Bromfenac (Prolensa) 0.07% eye drops  We discussed:  Pt had cataract surgery last  month, she will be done with eye drops after this month  Plan  Continue current medications   Health Maintenance   Patient is currently controlled on the following medications:   Vitamin B12 250 mcg  Vitamin D 2000 IU daily  Multivitamin  We discussed:  Patient is satisfied with current OTC regimen and denies issues  Plan  Continue current medications   Medication Management   [facility] Uses pill box? Yes Pt endorses 90% compliance  We discussed: Verbal consent obtained for [facility] enhanced pharmacy services (medication synchronization, adherence packaging, delivery coordination). A medication sync plan was created to allow patient to get all medications delivered once every 30 to 90 days per patient preference. Patient understands they have freedom to choose pharmacy and clinical pharmacist will coordinate care between all prescribers and [facility].   Plan  Utilize [facility] for medication synchronization, packaging and delivery    Follow up: 1 month phone visit  Charlene Brooke, PharmD Clinical Pharmacist [facility] [phone]

## 2019-10-04 ENCOUNTER — Other Ambulatory Visit: Payer: Self-pay

## 2019-10-04 ENCOUNTER — Ambulatory Visit: Payer: Medicare Other | Admitting: Pharmacist

## 2019-10-04 DIAGNOSIS — I10 Essential (primary) hypertension: Secondary | ICD-10-CM

## 2019-10-04 DIAGNOSIS — E1142 Type 2 diabetes mellitus with diabetic polyneuropathy: Secondary | ICD-10-CM

## 2019-10-04 DIAGNOSIS — E782 Mixed hyperlipidemia: Secondary | ICD-10-CM

## 2019-10-04 NOTE — Patient Instructions (Addendum)
Visit Information  Thank you for meeting with me to discuss your medications! I look forward to working with you to achieve your health care goals. Below is a summary of what we talked about during the visit:  Goals Addressed            This Visit's Progress   . Pharmacy Care Plan       CARE PLAN ENTRY  Current Barriers:  . Chronic Disease Management support, education, and care coordination needs related to Hypertension, Hyperlipidemia, and Diabetes   Hypertension . Pharmacist Clinical Goal(s): o Over the next 30 days, patient will work with PharmD and providers to maintain BP goal <130/80 . Current regimen:  o Losartan 100 mg daily o HCTZ 25 mg daily . Interventions: o Discussed benefits of monitoring BP at home . Patient self care activities - Over the next 30 days, patient will: o Check BP 1-2 times weekly, document, and provide at future appointments o Ensure daily salt intake < 2300 mg/day  Hyperlipidemia Lipid Panel     Component Value Date/Time   CHOL 188 04/27/2019 1040   TRIG 212.0 (H) 04/27/2019 1040   HDL 55.40 04/27/2019 1040   LDLDIRECT 108.0 04/27/2019 1040 .  Pharmacist Clinical Goal(s): o Over the next 30 days, patient will work with PharmD and providers to achieve LDL goal < 100 . Current regimen:  o Atorvastatin 10 mg daily . Interventions: o Discussed cholesterol goals and benefits of statin regarding cardiovascular risk reduction o Discussed effects of statin on liver function; as long as liver function remains normal, the risk of liver damage is extremely low . Patient self care activities - Over the next 30 days, patient will: o Continue atorvastatin as prescribed o Continue low cholesterol diet  Diabetes . Pharmacist Clinical Goal(s): o Over the next 30 days, patient will work with PharmD and providers to achieve A1c goal <7% . Current regimen:  o Trulicity 3 mg once weekly o Repaglinide 2 mg TID before meals . Interventions: o Discussed  need for medication that addresses elevated fasting blood sugar o Recommend to start insulin (Lantus 10 units, titrated to fasting BG < 130) . Patient self care activities - Over the next 30 days, patient will: o Check blood sugar once daily and in the morning before eating or drinking, document, and provide at future appointments o Contact provider with any episodes of hypoglycemia o Pending PCP approval, begin Lantus 10 units once daily and bring insulin to office for pharmacist education  Joint Pain . Pharmacist Clinical Goal(s) o Over the next 30 days, patient will work with PharmD and providers to optimize OTC pain relievers . Current regimen:  o Ibuprofen up to 600 mg per day . Interventions: o Recommended Tylenol Extra Strength (500 mg) - 2 tablets up to 3 times daily for joint pain . Patient self care activities - Over the next 30 days, patient will: o Stop ibuprofen (Advil) and begin Tylenol as directed above o Contact PCP if joint pain worsens  Medication management . Pharmacist Clinical Goal(s): o Over the next 30 days, patient will work with PharmD and providers to achieve optimal medication adherence . [facility] . Interventions o Comprehensive medication review performed. o Utilize [facility] for medication synchronization, packaging and delivery . Patient self care activities - Over the next 30 days, patient will: o Focus on medication adherence by pill box o Take medications as prescribed o Report any questions or concerns to PharmD and/or provider(s)  Initial  goal documentation        Ms. Hardy was given information about Chronic Care Management services today including:  1. CCM service includes personalized support from designated clinical staff supervised by her physician, including individualized plan of care and coordination with other care providers 2. 24/7 contact phone numbers for assistance for urgent and routine care needs. 3. Standard  insurance, coinsurance, copays and deductibles apply for chronic care management only during months in which we provide at least 20 minutes of these services. Most insurances cover these services at 100%, however patients may be responsible for any copay, coinsurance and/or deductible if applicable. This service may help you avoid the need for more expensive face-to-face services. 4. Only one practitioner may furnish and bill the service in a calendar month. 5. The patient may stop CCM services at any time (effective at the end of the month) by phone call to the office staff.  Patient agreed to services and verbal consent obtained.   The patient verbalized understanding of instructions provided today and agreed to receive a mailed copy of patient instruction and/or educational materials. Telephone follow up appointment with pharmacy team member scheduled for: 1 month  Charlene Brooke, PharmD Clinical Pharmacist [facility] [phone]    Carbohydrate Counting for Diabetes Mellitus, Adult  Carbohydrate counting is a method of keeping track of how many carbohydrates you eat. Eating carbohydrates naturally increases the amount of sugar (glucose) in the blood. Counting how many carbohydrates you eat helps keep your blood glucose within normal limits, which helps you manage your diabetes (diabetes mellitus). It is important to know how many carbohydrates you can safely have in each meal. This is different for every person. A diet and nutrition specialist (registered dietitian) can help you make a meal plan and calculate how many carbohydrates you should have at each meal and snack. Carbohydrates are found in the following foods:  Grains, such as breads and cereals.  Dried beans and soy products.  Starchy vegetables, such as potatoes, peas, and corn.  Fruit and fruit juices.  Milk and yogurt.  Sweets and snack foods, such as cake, cookies, candy, chips, and soft  drinks. How do I count carbohydrates? There are two ways to count carbohydrates in food. You can use either of the methods or a combination of both. Reading "Nutrition Facts" on packaged food The "Nutrition Facts" list is included on the labels of almost all packaged foods and beverages in the [place] It includes:  The serving size.  Information about nutrients in each serving, including the grams (g) of carbohydrate per serving. To use the "Nutrition Facts":  Decide how many servings you will have.  Multiply the number of servings by the number of carbohydrates per serving.  The resulting number is the total amount of carbohydrates that you will be having. Learning standard serving sizes of other foods When you eat carbohydrate foods that are not packaged or do not include "Nutrition Facts" on the label, you need to measure the servings in order to count the amount of carbohydrates:  Measure the foods that you will eat with a food scale or measuring cup, if needed.  Decide how many standard-size servings you will eat.  Multiply the number of servings by 15. Most carbohydrate-rich foods have about 15 g of carbohydrates per serving. ? For example, if you eat 8 oz (170 g) of strawberries, you will have eaten 2 servings and 30 g of carbohydrates (2 servings x 15 g =  30 g).  For foods that have more than one food mixed, such as soups and casseroles, you must count the carbohydrates in each food that is included. The following list contains standard serving sizes of common carbohydrate-rich foods. Each of these servings has about 15 g of carbohydrates:   hamburger bun or  English muffin.   oz (15 mL) syrup.   oz (14 g) jelly.  1 slice of bread.  1 six-inch tortilla.  3 oz (85 g) cooked rice or pasta.  4 oz (113 g) cooked dried beans.  4 oz (113 g) starchy vegetable, such as peas, corn, or potatoes.  4 oz (113 g) hot cereal.  4 oz (113 g) mashed potatoes or  of a large  baked potato.  4 oz (113 g) canned or frozen fruit.  4 oz (120 mL) fruit juice.  4-6 crackers.  6 chicken nuggets.  6 oz (170 g) unsweetened dry cereal.  6 oz (170 g) plain fat-free yogurt or yogurt sweetened with artificial sweeteners.  8 oz (240 mL) milk.  8 oz (170 g) fresh fruit or one small piece of fruit.  24 oz (680 g) popped popcorn. Example of carbohydrate counting Sample meal  3 oz (85 g) chicken breast.  6 oz (170 g) brown rice.  4 oz (113 g) corn.  8 oz (240 mL) milk.  8 oz (170 g) strawberries with sugar-free whipped topping. Carbohydrate calculation 1. Identify the foods that contain carbohydrates: ? Rice. ? Corn. ? Milk. ? Strawberries. 2. Calculate how many servings you have of each food: ? 2 servings rice. ? 1 serving corn. ? 1 serving milk. ? 1 serving strawberries. 3. Multiply each number of servings by 15 g: ? 2 servings rice x 15 g = 30 g. ? 1 serving corn x 15 g = 15 g. ? 1 serving milk x 15 g = 15 g. ? 1 serving strawberries x 15 g = 15 g. 4. Add together all of the amounts to find the total grams of carbohydrates eaten: ? 30 g + 15 g + 15 g + 15 g = 75 g of carbohydrates total. Summary  Carbohydrate counting is a method of keeping track of how many carbohydrates you eat.  Eating carbohydrates naturally increases the amount of sugar (glucose) in the blood.  Counting how many carbohydrates you eat helps keep your blood glucose within normal limits, which helps you manage your diabetes.  A diet and nutrition specialist (registered dietitian) can help you make a meal plan and calculate how many carbohydrates you should have at each meal and snack. This information is not intended to replace advice given to you by your health care provider. Make sure you discuss any questions you have with your health care provider. Document Revised: 11/26/2016 Document Reviewed: 10/16/2015 Elsevier Patient Education  [place].

## 2019-10-04 NOTE — Addendum Note (Signed)
Addended by: Aviva Signs M on: 10/04/2019 10:40 AM   Modules accepted: Orders

## 2019-10-06 ENCOUNTER — Telehealth: Payer: Self-pay | Admitting: Endocrinology

## 2019-10-06 NOTE — Telephone Encounter (Signed)
F/u is due.  Let's address then

## 2019-10-06 NOTE — Telephone Encounter (Signed)
Please advise 

## 2019-10-06 NOTE — Telephone Encounter (Signed)
Returned pt call. Informed of Dr. Cordelia Pen request. Scheduled as follows:  Next Appt With Internal Medicine Renato Shin, MD) 10/10/2019 at 9:45 AM

## 2019-10-06 NOTE — Progress Notes (Addendum)
After discussion with PCP will refer back to endocrinology regarding high fasting BG. Called patient and recommended she call Dr Cordelia Pen office, pt voiced understanding and agreed to call.  Charlton Haws, [facility] 10/06/19 9:53 AM

## 2019-10-06 NOTE — Telephone Encounter (Signed)
Patient called with concerns about her blood sugar readings.  She is have fasting AM labs of 170-190.  PCP advised patient to reach out to Dr Loanne Drilling to see she can be assisted  Phone: [phone]

## 2019-10-10 ENCOUNTER — Encounter: Payer: Self-pay | Admitting: Endocrinology

## 2019-10-10 ENCOUNTER — Other Ambulatory Visit: Payer: Self-pay

## 2019-10-10 ENCOUNTER — Ambulatory Visit (INDEPENDENT_AMBULATORY_CARE_PROVIDER_SITE_OTHER): Payer: Medicare Other | Admitting: Endocrinology

## 2019-10-10 VITALS — BP 124/70 | HR 81 | Ht 60.0 in | Wt 205.0 lb

## 2019-10-10 DIAGNOSIS — E1142 Type 2 diabetes mellitus with diabetic polyneuropathy: Secondary | ICD-10-CM

## 2019-10-10 LAB — POCT GLYCOSYLATED HEMOGLOBIN (HGB A1C): Hemoglobin A1C: 6.7 % — AB (ref 4.0–5.6)

## 2019-10-10 MED ORDER — TRULICITY 4.5 MG/0.5ML ~~LOC~~ SOAJ: 4.5000 mg | SUBCUTANEOUS | 3 refills | Status: DC

## 2019-10-10 MED ORDER — REPAGLINIDE 2 MG PO TABS: 2.0000 mg | ORAL_TABLET | Freq: Two times a day (BID) | ORAL | 3 refills | Status: DC

## 2019-10-10 NOTE — Progress Notes (Signed)
Subjective:    Patient ID: Heather [name], female    DOB: [birth year], 69 y.o.   MRN: [id number]  HPI Pt returns for f/u of diabetes mellitus: DM type: 2 Dx'ed: 2505 Complications: PN.   Therapy: trulicity and repaglinide.   GDM: never DKA: never Severe hypoglycemia: never.  Pancreatitis: never Other: she has never been on insulin; short bowel syndrome limits metformin dosage; she did not tolerate [place] or Jardiance (vaginitis); edema precludes pioglitazone.   Interval history: pt states she feels well in general.  she brings a record of her cbg's which I have reviewed today.  cbg's vary from 60-200.  She has mild frequent mild hypoglycemia in the afternoon.   Past Medical History:  Diagnosis Date  . Anemia   . Colon polyps   . Diabetes mellitus without complication ([place])   . Environmental allergies   . GERD (gastroesophageal reflux disease)   . Heart murmur   . History of DVT (deep vein thrombosis) 1970'S  . History of hiatal hernia   . History of transfusion   . Hyperlipidemia   . Hypertension   . Interstitial cystitis    Dr Janice Norrie  . Shortness of breath dyspnea    "due to low hemaglobin"    Past Surgical History:  Procedure Laterality Date  . APPENDECTOMY    . BILATERAL SALPINGOOPHORECTOMY     painful cysts  . COLONOSCOPY  2005   negative; Dr Earlean Shawl. F/U declined  . CYSTOSCOPY     X 4; Dr Janice Norrie  . LAPAROSCOPIC RIGHT COLECTOMY Right 08/19/2015   Procedure: LAPAROSCOPIC ASSISTED  RIGHT COLECTOMY;  Surgeon: Alphonsa Overall, MD;  Location: [facility];  Service: General;  Laterality: Right;  . PREMAGLINANT POLYPS    . TOTAL ABDOMINAL HYSTERECTOMY     metromenorrhagia    Social History   Socioeconomic History  . Marital status: Widowed    Spouse name: Not on file  . Number of children: 2  . Years of education: Not on file  . Highest education level: Not on file  Occupational History  . Not on file  Tobacco Use  . Smoking status: Former Smoker    Quit date:  05/18/1984    Years since quitting: 35.4  . Smokeless tobacco: Never Used  . Tobacco comment: smoked 1972-1986, up to 1 ppd  Substance and Sexual Activity  . Alcohol use: No    Alcohol/week: 0.0 standard drinks  . Drug use: No  . Sexual activity: Never  Other Topics Concern  . Not on file  Social History Narrative   Lives with brother, daughter and 2 grandchildren in a 3 story home.  Has 2 children.  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     Highest level of education:  2 years of graduate school   Social Determinants of Health   Financial Resource Strain: Low Risk   . Difficulty of Paying Living Expenses: Not hard at all  Food Insecurity:   . Worried About Charity fundraiser in the Last Year:   . Arboriculturist in the Last Year:   Transportation Needs:   . Film/video editor (Medical):   Marland Kitchen Lack of Transportation (Non-Medical):   Physical Activity:   . Days of Exercise per Week:   . Minutes of Exercise per Session:   Stress:   . Feeling of Stress :   Social Connections:   . Frequency of Communication with Friends  and Family:   . Frequency of Social Gatherings with Friends and Family:   . Attends Religious Services:   . Active Member of Clubs or Organizations:   . Attends Archivist Meetings:   Marland Kitchen Marital Status:   Intimate Partner Violence:   . Fear of Current or Ex-Partner:   . Emotionally Abused:   Marland Kitchen Physically Abused:   . Sexually Abused:     Current Outpatient Medications on File Prior to Visit  Medication Sig Dispense Refill  . atorvastatin (LIPITOR) 10 MG tablet TAKE 1 TABLET BY MOUTH EVERY DAY 90 tablet 1  . Blood Glucose Monitoring Suppl (ONE TOUCH ULTRA 2) w/Device KIT 1 each by Does not apply route 4 (four) times daily. Use device to monitor glucose levels 4 times per day; E11.9 (Patient taking differently: 1 each by Does not apply route daily. Use device to monitor glucose levels 4 times per  day; E11.9) 1 each 0  . Cyanocobalamin 2500 MCG TABS Take 2,500 mcg by mouth daily.     . Famotidine (PEPCID PO) 1 tablet as needed.     . gabapentin (NEURONTIN) 100 MG capsule START WITH 1 CAPSULE IN MORNING AND AFTERNOON, THEN INCREASE TO 3 CAPSULES TWICE A DAY IF TOLERATED 180 capsule 1  . gabapentin (NEURONTIN) 600 MG tablet Take 2 tablets (1,200 mg total) by mouth at bedtime. 180 tablet 1  . glucose blood (ONETOUCH ULTRA) test strip 1 each by Other route daily. And lancets 1/day 100 each 3  . hydrochlorothiazide (HYDRODIURIL) 25 MG tablet TAKE 1 TABLET BY MOUTH EVERY DAY 90 tablet 1  . losartan (COZAAR) 100 MG tablet TAKE 1 TABLET BY MOUTH EVERY DAY 90 tablet 1  . multivitamin-lutein (OCUVITE-LUTEIN) CAPS capsule Take 1 capsule by mouth daily.    . ONE TOUCH LANCETS MISC 1 Device by Other route daily. Use to test blood sugars once daily 90 each 3   No current facility-administered medications on file prior to visit.    Allergies  Allergen Reactions  . Farxiga [Dapagliflozin]     Constant Vaginal Infections  . Flagyl [Metronidazole] Other (See Comments)    Made really sick. Was able to tolerate dose 4/17 with zofran  . Jardiance [Empagliflozin]     Constant Vaginal infections  . Lisinopril     04/03/15 cough reported X several months  . Cholestyramine Other (See Comments)    Joint pain  . Sulfonamide Derivatives     nausea    Family History  Problem Relation Age of Onset  . Heart attack Mother        in 6s  . Hypertension Mother   . Stroke Mother         in 54s  . Other Father        Deceased, 28  . Stroke Maternal Grandfather 50  . Heart attack Paternal Grandfather        in 4s  . Breast cancer Sister   . Healthy Brother   . Healthy Son   . Healthy Daughter   . Diabetes Neg Hx     BP 124/70   Pulse 81   Ht 5' (1.524 m)   Wt 205 lb (93 kg)   SpO2 96%   BMI 40.04 kg/m    Review of Systems She denies hypoglycemia.      Objective:   Physical  Exam VITAL SIGNS:  See vs page GENERAL: no distress Pulses: dorsalis pedis intact bilat.   MSK: no deformity of the feet  CV: 1+ bilat leg edema Skin:  no ulcer on the feet.  normal color and temp on the feet. Neuro: sensation is intact to touch on the feet, but severely reduced from normal Ext: there is bilateral onychomycosis of the toenails.    Lab Results  Component Value Date   CREATININE 0.91 04/27/2019   BUN 14 04/27/2019   NA 135 04/27/2019   K 4.4 04/27/2019   CL 99 04/27/2019   CO2 26 04/27/2019    Lab Results  Component Value Date   HGBA1C 6.7 (A) 10/10/2019   Lab Results  Component Value Date   TSH 1.95 11/25/2018       Assessment & Plan:  Type 2 DM, with PN: well-controlled Hypoglycemia, due to repaglinide.  Patient Instructions  I have sent a prescription to your pharmacy, to increase the Trulicity, and:  Please reduce repaglinide to breakfast and supper only.   check your blood sugar once a day.  vary the time of day when you check, between before the 3 meals, and at bedtime.  also check if you have symptoms of your blood sugar being too high or too low.  please keep a record of the readings and bring it to your next appointment here (or you can bring the meter itself).  You can write it on any piece of paper.  please call us sooner if your blood sugar goes below 70, or if you have a lot of readings over 200. Please come back for a follow-up appointment in 2 months.

## 2019-10-10 NOTE — Patient Instructions (Addendum)
I have sent a prescription to your pharmacy, to increase the Trulicity, and:  Please reduce repaglinide to breakfast and supper only.   check your blood sugar once a day.  vary the time of day when you check, between before the 3 meals, and at bedtime.  also check if you have symptoms of your blood sugar being too high or too low.  please keep a record of the readings and bring it to your next appointment here (or you can bring the meter itself).  You can write it on any piece of paper.  please call us sooner if your blood sugar goes below 70, or if you have a lot of readings over 200. Please come back for a follow-up appointment in 2 months.

## 2019-10-25 NOTE — Progress Notes (Signed)
Subjective:    Patient ID: Heather [name], female    DOB: Aug 14, 1950, 69 y.o.   MRN: [id number]  HPI The patient is here for follow up of their chronic medical problems, including DM with neuropathy, htn, hyperlipidemia  She is taking all of her medications as prescribed.   She is not exercising regularly.  She does yard work  She is having joint pain and it is moving around .  It started mid-January.  It started when she got up one morning and could not walk on her right leg. She had to walk with a cane. It has gone to her ankles and then her left knee.  Her right knee got better.  Her wrists, fingers, right elbow, shoulder and lower back all hurt.  She can only stand for 15 minutes because of her back pain.  She did have some swelling in her knees, but not in other joints.  She has decreased ROM of the right shoulder.  She has taken tylenol and advil.      Medications and allergies reviewed with patient and updated if appropriate.  Patient Active Problem List   Diagnosis Date Noted   Acute cystitis without hematuria 10/11/2018   Cataract 11/22/2017   Dry cough 11/22/2017   Nocturnal leg cramps 02/02/2017   DOE (dyspnea on exertion) 08/04/2016   Obese 08/04/2016   Generalized osteoarthritis of hand 02/04/2016   Diabetic polyneuropathy associated with diabetes mellitus due to underlying condition ([place]) 11/27/2015   Numbness and tingling of leg 10/09/2015   Hiatal hernia, large 08/02/2015   Colon polyps 08/02/2015   Anemia, unspecified 04/21/2015   Diabetes type 2, controlled ([place]) 05/03/2014   Hyperlipidemia, mixed 03/18/2014   Interstitial cystitis 07/08/2012   Essential hypertension 02/10/2011    Current Outpatient Medications on File Prior to Visit  Medication Sig Dispense Refill   atorvastatin (LIPITOR) 10 MG tablet TAKE 1 TABLET BY MOUTH EVERY DAY 90 tablet 1   Blood Glucose Monitoring Suppl (ONE TOUCH ULTRA 2) w/Device KIT 1 each by Does not  apply route 4 (four) times daily. Use device to monitor glucose levels 4 times per day; E11.9 (Patient taking differently: 1 each by Does not apply route daily. Use device to monitor glucose levels 4 times per day; E11.9) 1 each 0   Cyanocobalamin 2500 MCG TABS Take 2,500 mcg by mouth daily.      Dulaglutide (TRULICITY) 4.5 ZL/9.3TT SOPN Inject 4.5 mg as directed once a week. 12 pen 3   Famotidine (PEPCID PO) 1 tablet as needed.      gabapentin (NEURONTIN) 100 MG capsule START WITH 1 CAPSULE IN MORNING AND AFTERNOON, THEN INCREASE TO 3 CAPSULES TWICE A DAY IF TOLERATED 180 capsule 1   gabapentin (NEURONTIN) 600 MG tablet Take 2 tablets (1,200 mg total) by mouth at bedtime. 180 tablet 1   glucose blood (ONETOUCH ULTRA) test strip 1 each by Other route daily. And lancets 1/day 100 each 3   hydrochlorothiazide (HYDRODIURIL) 25 MG tablet TAKE 1 TABLET BY MOUTH EVERY DAY 90 tablet 1   losartan (COZAAR) 100 MG tablet TAKE 1 TABLET BY MOUTH EVERY DAY 90 tablet 1   multivitamin-lutein (OCUVITE-LUTEIN) CAPS capsule Take 1 capsule by mouth daily.     ONE TOUCH LANCETS MISC 1 Device by Other route daily. Use to test blood sugars once daily 90 each 3   repaglinide (PRANDIN) 2 MG tablet Take 1 tablet (2 mg total) by mouth 2 (two) times daily before a  meal. 180 tablet 3   No current facility-administered medications on file prior to visit.    Past Medical History:  Diagnosis Date   Anemia    Colon polyps    Diabetes mellitus without complication ([facility])    Environmental allergies    GERD (gastroesophageal reflux disease)    Heart murmur    History of DVT (deep vein thrombosis) 1970'S   History of hiatal hernia    History of transfusion    Hyperlipidemia    Hypertension    Interstitial cystitis    Dr Janice Norrie   Shortness of breath dyspnea    "due to low hemaglobin"    Past Surgical History:  Procedure Laterality Date   APPENDECTOMY     BILATERAL SALPINGOOPHORECTOMY      painful cysts   COLONOSCOPY  2005   negative; Dr Earlean Shawl. F/U declined   CYSTOSCOPY     X 4; Dr Janice Norrie   LAPAROSCOPIC RIGHT COLECTOMY Right 08/19/2015   Procedure: LAPAROSCOPIC ASSISTED  RIGHT COLECTOMY;  Surgeon: Alphonsa Overall, MD;  Location: [facility];  Service: General;  Laterality: Right;   PREMAGLINANT POLYPS     TOTAL ABDOMINAL HYSTERECTOMY     metromenorrhagia    Social History   Socioeconomic History   Marital status: Widowed    Spouse name: Not on file   Number of children: 2   Years of education: Not on file   Highest education level: Not on file  Occupational History   Not on file  Tobacco Use   Smoking status: Former Smoker    Quit date: 05/18/1984    Years since quitting: 35.4   Smokeless tobacco: Never Used   Tobacco comment: smoked 1972-1986, up to 1 ppd  Vaping Use   Vaping Use: Never used  Substance and Sexual Activity   Alcohol use: No    Alcohol/week: 0.0 standard drinks   Drug use: No   Sexual activity: Never  Other Topics Concern   Not on file  Social History Narrative   Lives with brother, daughter and 2 grandchildren in a 3 story home.  Has 2 children.  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     Highest level of education:  2 years of graduate school   Social Determinants of Health   Financial Resource Strain: Low Risk    Difficulty of Paying Living Expenses: Not hard at all  Food Insecurity:    Worried About Charity fundraiser in the Last Year:    Arboriculturist in the Last Year:   Transportation Needs:    Film/video editor (Medical):    Lack of Transportation (Non-Medical):   Physical Activity:    Days of Exercise per Week:    Minutes of Exercise per Session:   Stress:    Feeling of Stress :   Social Connections:    Frequency of Communication with Friends and Family:    Frequency of Social Gatherings with Friends and Family:    Attends Religious Services:      Active Member of Clubs or Organizations:    Attends Music therapist:    Marital Status:     Family History  Problem Relation Age of Onset   Heart attack Mother        in 71s   Hypertension Mother    Stroke Mother         in 102s   Other Father  Deceased, 14   Stroke Maternal Grandfather 66   Heart attack Paternal Grandfather        in 43s   Breast cancer Sister    Healthy Brother    Healthy Son    Healthy Daughter    Diabetes Neg Hx     Review of Systems  Constitutional: Negative for chills and fever.  Respiratory: Positive for shortness of breath. Negative for cough and wheezing.   Cardiovascular: Positive for leg swelling (mild at times). Negative for chest pain and palpitations.  Musculoskeletal: Positive for arthralgias, back pain (lower back ) and joint swelling (knees only). Negative for myalgias.  Skin: Negative for rash.  Neurological: Positive for light-headedness (a little at times) and headaches.       Objective:   Vitals:   10/26/19 0816  BP: 130/72  Pulse: 75  Temp: 98.4 F (36.9 C)  SpO2: 98%   BP Readings from Last 3 Encounters:  10/26/19 130/72  10/10/19 124/70  04/27/19 128/80   Wt Readings from Last 3 Encounters:  10/26/19 203 lb 6.4 oz (92.3 kg)  10/10/19 205 lb (93 kg)  04/27/19 205 lb 1.9 oz (93 kg)   Body mass index is 39.72 kg/m.   Physical Exam    Constitutional: Appears well-developed and well-nourished. No distress.  HENT:  Head: Normocephalic and atraumatic.  Neck: Neck supple. No tracheal deviation present. No thyromegaly present.  No cervical lymphadenopathy Cardiovascular: Normal rate, regular rhythm and normal heart sounds.   No murmur heard. No carotid bruit .  No edema Pulmonary/Chest: Effort normal and breath sounds normal. No respiratory distress. No has no wheezes. No rales.  Msk: no joint swelling, decreased ROM r shoulder, no obvious crepitus b/l knees with good ROM.  Mild OA  changes in hands Skin: Skin is warm and dry. Not diaphoretic.  Psychiatric: Normal mood and affect. Behavior is normal.      Assessment & Plan:    See Problem List for Assessment and Plan of chronic medical problems.    This visit occurred during the SARS-CoV-2 public health emergency.  Safety protocols were in place, including screening questions prior to the visit, additional usage of staff PPE, and extensive cleaning of exam room while observing appropriate contact time as indicated for disinfecting solutions.

## 2019-10-25 NOTE — Patient Instructions (Addendum)
  Blood work was ordered.     Medications reviewed and updated.  Changes include :   none  Your prescription(s) have been submitted to your pharmacy. Please take as directed and contact our office if you believe you are having problem(s) with the medication(s).   Please followup in 6 months   

## 2019-10-26 ENCOUNTER — Encounter: Payer: Self-pay | Admitting: Internal Medicine

## 2019-10-26 ENCOUNTER — Other Ambulatory Visit: Payer: Self-pay

## 2019-10-26 ENCOUNTER — Ambulatory Visit (INDEPENDENT_AMBULATORY_CARE_PROVIDER_SITE_OTHER): Payer: Medicare Other | Admitting: Internal Medicine

## 2019-10-26 VITALS — BP 130/72 | HR 75 | Temp 98.4°F | Ht 60.0 in | Wt 203.4 lb

## 2019-10-26 DIAGNOSIS — M2550 Pain in unspecified joint: Secondary | ICD-10-CM

## 2019-10-26 DIAGNOSIS — I10 Essential (primary) hypertension: Secondary | ICD-10-CM

## 2019-10-26 DIAGNOSIS — E1142 Type 2 diabetes mellitus with diabetic polyneuropathy: Secondary | ICD-10-CM

## 2019-10-26 DIAGNOSIS — E0842 Diabetes mellitus due to underlying condition with diabetic polyneuropathy: Secondary | ICD-10-CM | POA: Diagnosis not present

## 2019-10-26 DIAGNOSIS — E782 Mixed hyperlipidemia: Secondary | ICD-10-CM | POA: Diagnosis not present

## 2019-10-26 LAB — COMPREHENSIVE METABOLIC PANEL
ALT: 27 U/L (ref 0–35)
AST: 20 U/L (ref 0–37)
Albumin: 4.2 g/dL (ref 3.5–5.2)
Alkaline Phosphatase: 59 U/L (ref 39–117)
BUN: 12 mg/dL (ref 6–23)
CO2: 30 mEq/L (ref 19–32)
Calcium: 10.1 mg/dL (ref 8.4–10.5)
Chloride: 98 mEq/L (ref 96–112)
Creatinine, Ser: 0.85 mg/dL (ref 0.40–1.20)
GFR: 65.92 mL/min (ref >60.00)
Glucose, Bld: 131 mg/dL — ABNORMAL HIGH (ref 70–99)
Potassium: 3.3 mEq/L — ABNORMAL LOW (ref 3.5–5.1)
Sodium: 136 mEq/L (ref 135–145)
Total Bilirubin: 1.2 mg/dL (ref 0.2–1.2)
Total Protein: 7 g/dL (ref 6.0–8.3)

## 2019-10-26 LAB — CBC WITH DIFFERENTIAL/PLATELET
Basophils Absolute: 0 10*3/uL (ref 0.0–0.1)
Basophils Relative: 0.6 % (ref 0.0–3.0)
Eosinophils Absolute: 0.2 10*3/uL (ref 0.0–0.7)
Eosinophils Relative: 3.2 % (ref 0.0–5.0)
HCT: 36.3 % (ref 36.0–46.0)
Hemoglobin: 12.3 g/dL (ref 12.0–15.0)
Lymphocytes Relative: 28.5 % (ref 12.0–46.0)
Lymphs Abs: 1.7 10*3/uL (ref 0.7–4.0)
MCHC: 33.8 g/dL (ref 30.0–36.0)
MCV: 74.9 fl — ABNORMAL LOW (ref 78.0–100.0)
Monocytes Absolute: 0.4 10*3/uL (ref 0.1–1.0)
Monocytes Relative: 6.8 % (ref 3.0–12.0)
Neutro Abs: 3.5 10*3/uL (ref 1.4–7.7)
Neutrophils Relative %: 60.9 % (ref 43.0–77.0)
Platelets: 307 10*3/uL (ref 150.0–400.0)
RBC: 4.84 Mil/uL (ref 3.87–5.11)
RDW: 15.9 % — ABNORMAL HIGH (ref 11.5–15.5)
WBC: 5.8 10*3/uL (ref 4.0–10.5)

## 2019-10-26 LAB — LIPID PANEL
Cholesterol: 166 mg/dL (ref 0–200)
HDL: 48.9 mg/dL (ref >39.00)
LDL Cholesterol: 78 mg/dL (ref 0–99)
NonHDL: 117.58
Total CHOL/HDL Ratio: 3
Triglycerides: 199 mg/dL — ABNORMAL HIGH (ref 0.0–149.0)
VLDL: 39.8 mg/dL (ref 0.0–40.0)

## 2019-10-26 LAB — C-REACTIVE PROTEIN: CRP: <1 mg/dL (ref 0.5–20.0)

## 2019-10-26 LAB — SEDIMENTATION RATE: Sed Rate: 24 mm/hr (ref 0–30)

## 2019-10-26 MED ORDER — HYDROCHLOROTHIAZIDE 25 MG PO TABS: 25.0000 mg | ORAL_TABLET | Freq: Every day | ORAL | 1 refills | Status: DC

## 2019-10-26 MED ORDER — GABAPENTIN 100 MG PO CAPS: ORAL_CAPSULE | ORAL | 1 refills | Status: DC

## 2019-10-26 MED ORDER — LOSARTAN POTASSIUM 100 MG PO TABS: 100.0000 mg | ORAL_TABLET | Freq: Every day | ORAL | 1 refills | Status: DC

## 2019-10-26 MED ORDER — ATORVASTATIN CALCIUM 10 MG PO TABS: 10.0000 mg | ORAL_TABLET | Freq: Every day | ORAL | 1 refills | Status: DC

## 2019-10-26 MED ORDER — GABAPENTIN 600 MG PO TABS: 1200.0000 mg | ORAL_TABLET | Freq: Every day | ORAL | 1 refills | Status: DC

## 2019-10-26 NOTE — Assessment & Plan Note (Signed)
Chronic Controlled Continue current dose of gabapentin

## 2019-10-26 NOTE — Assessment & Plan Note (Signed)
Chronic Management per dr Loanne Drilling

## 2019-10-26 NOTE — Assessment & Plan Note (Signed)
Chronic BP well controlled Current regimen effective and well tolerated Continue current medications at current doses cmp  

## 2019-10-26 NOTE — Assessment & Plan Note (Signed)
Acute Joint pain started approximately 6 months ago and has been somewhat migratory-involves multiple joints including knees, ankles, hands, right elbow and right shoulder She does have some osteoarthritis, but never had pain that seemed to move around like this Does yard work-denies known tick bites, but of course there is a risk for Lyme disease ?  Autoimmune in nature, flare of generalized osteoarthritis, infectious in nature ANA, ESR, Lyme antibodies, RF If above results are negative will refer to sports medicine

## 2019-10-26 NOTE — Assessment & Plan Note (Signed)
Chronic Check lipid panel  Continue daily statin Regular exercise and healthy diet encouraged  

## 2019-10-29 LAB — ANTI-NUCLEAR AB-TITER (ANA TITER): ANA Titer 1: 1:40 {titer} — ABNORMAL HIGH

## 2019-10-29 LAB — B. BURGDORFI ANTIBODIES: B burgdorferi Ab IgG+IgM: <0.9 index

## 2019-10-29 LAB — RHEUMATOID FACTOR: Rheumatoid fact SerPl-aCnc: <14 IU/mL (ref <14)

## 2019-10-29 LAB — ANA: Anti Nuclear Antibody (ANA): POSITIVE — AB

## 2019-11-06 ENCOUNTER — Telehealth: Payer: Medicare Other | Admitting: Endocrinology

## 2019-11-29 ENCOUNTER — Ambulatory Visit: Payer: Medicare Other | Admitting: Pharmacist

## 2019-11-29 ENCOUNTER — Other Ambulatory Visit: Payer: Self-pay

## 2019-11-29 DIAGNOSIS — I10 Essential (primary) hypertension: Secondary | ICD-10-CM

## 2019-11-29 DIAGNOSIS — E1142 Type 2 diabetes mellitus with diabetic polyneuropathy: Secondary | ICD-10-CM

## 2019-11-29 DIAGNOSIS — E782 Mixed hyperlipidemia: Secondary | ICD-10-CM

## 2019-11-29 NOTE — Chronic Care Management (AMB) (Signed)
Chronic Care Management Pharmacy  Name: ISALY FASCHING  MRN: [id number] DOB: [birth year]  Chief Complaint/ HPI  [name],  69 y.o. , female presents for their Follow-Up CCM visit with the clinical pharmacist via telephone due to COVID-19 Pandemic.  PCP : Binnie Rail, MD  Their chronic conditions include: HTN, T2DM, osteoarthritis, HLD  Pt is a widow, husband died with liver complications, she is worried about damage to her own liver. She lives alone with her dog. She enjoys working in the yard.  Office Visits: 10/26/19 Dr Quay Burow OV: joint pain started 6 months ago, multiple joints, tested for Lyme disease and autoimmune cause - negative.  04/27/19 Dr Quay Burow OV: uncontrolled neuropathy. Start gabapentin 100 mg and increased if needed. May decreased B12 supplement due to high-normal lab results. Rec'd Vitamin B complex for neuropathy as well.  Consult Visit: 10/10/19 Dr Loanne Drilling (endocrine): reduced repaglinide to BID due to afternoon lows; Increased Trulicity to 4.5 mg weekly  06/09/19 Dr Loanne Drilling (endocrine): short bowel syndrome limits metformin usage; did not tolerate SGLT2 (vaginitis); edema precludes pioglitazone. Increased Trulicity 1.5 - 3 mg weekly.  Allergies  Allergen Reactions  . Farxiga [Dapagliflozin]     Constant Vaginal Infections  . Flagyl [Metronidazole] Other (See Comments)    Made really sick. Was able to tolerate dose 4/17 with zofran  . Jardiance [Empagliflozin]     Constant Vaginal infections  . Lisinopril     04/03/15 cough reported X several months  . Cholestyramine Other (See Comments)    Joint pain  . Sulfonamide Derivatives     nausea    Medications: Outpatient Encounter Medications as of 11/29/2019  Medication Sig  . atorvastatin (LIPITOR) 10 MG tablet Take 1 tablet (10 mg total) by mouth daily.  . Blood Glucose Monitoring Suppl (ONE TOUCH ULTRA 2) w/Device KIT 1 each by Does not apply route 4 (four) times daily. Use device to monitor  glucose levels 4 times per day; E11.9 (Patient taking differently: 1 each by Does not apply route daily. Use device to monitor glucose levels 4 times per day; E11.9)  . Cyanocobalamin 2500 MCG TABS Take 2,500 mcg by mouth daily.   . Dulaglutide (TRULICITY) 4.5 RW/4.3XV SOPN Inject 4.5 mg as directed once a week.  . Famotidine (PEPCID PO) 1 tablet as needed.   . gabapentin (NEURONTIN) 100 MG capsule START WITH 1 CAPSULE IN MORNING AND AFTERNOON, THEN INCREASE TO 3 CAPSULES TWICE A DAY IF TOLERATED. Dx Q00.86  . gabapentin (NEURONTIN) 600 MG tablet Take 2 tablets (1,200 mg total) by mouth at bedtime. Dx P61.95  . glucose blood (ONETOUCH ULTRA) test strip 1 each by Other route daily. And lancets 1/day  . hydrochlorothiazide (HYDRODIURIL) 25 MG tablet Take 1 tablet (25 mg total) by mouth daily.  Marland Kitchen losartan (COZAAR) 100 MG tablet Take 1 tablet (100 mg total) by mouth daily.  . multivitamin-lutein (OCUVITE-LUTEIN) CAPS capsule Take 1 capsule by mouth daily.  . ONE TOUCH LANCETS MISC 1 Device by Other route daily. Use to test blood sugars once daily  . repaglinide (PRANDIN) 2 MG tablet Take 1 tablet (2 mg total) by mouth 2 (two) times daily before a meal.   No facility-administered encounter medications on file as of 11/29/2019.     Current Diagnosis/Assessment:  SDOH Interventions     Most Recent Value  SDOH Interventions  Housing Interventions Intervention Not Indicated     Goals Addressed  This Visit's Progress   . Pharmacy Care Plan       CARE PLAN ENTRY  Current Barriers:  . Chronic Disease Management support, education, and care coordination needs related to Hypertension, Hyperlipidemia, and Diabetes   Hypertension BP Readings from Last 3 Encounters:  10/26/19 130/72  10/10/19 124/70  04/27/19 128/80 .  Pharmacist Clinical Goal(s): o Over the next 90 days, patient will work with PharmD and providers to maintain BP goal <130/80 . Current regimen:  o Losartan 100 mg  daily o HCTZ 25 mg daily . Interventions: o Discussed benefits of monitoring BP at home . Patient self care activities - Over the next 90 days, patient will: o Check BP 1-2 times weekly, document, and provide at future appointments o Ensure daily salt intake < 2300 mg/day  Hyperlipidemia Lab Results  Component Value Date/Time   LDLCALC 78 10/26/2019 09:03 AM   LDLCALC 217 (H) 03/15/2014 08:10 AM   LDLDIRECT 108.0 04/27/2019 10:40 AM .  Pharmacist Clinical Goal(s): o Over the next 30 days, patient will work with PharmD and providers to achieve LDL goal < 100 . Current regimen:  o Atorvastatin 10 mg daily . Interventions: o Discussed cholesterol goals and benefits of statin regarding cardiovascular risk reduction o Discussed effects of statin on liver function; as long as liver function remains normal, the risk of liver damage is very low . Patient self care activities - Over the next 30 days, patient will: o Continue atorvastatin as prescribed o Continue low cholesterol diet  Diabetes Lab Results  Component Value Date/Time   HGBA1C 6.7 (A) 10/10/2019 09:42 AM   HGBA1C 7.2 (H) 04/27/2019 10:40 AM   HGBA1C 6.8 (H) 11/25/2018 08:49 AM   HGBA1C 6.7 04/21/2018 08:04 AM .  Pharmacist Clinical Goal(s): o Over the next 90 days, patient will work with PharmD and providers to achieve A1c goal <7% . Current regimen:  o Trulicity 4.5 mg once weekly o Repaglinide 2 mg twice a day before breakfast and dinner . Interventions: o Discussed importance of maintaining A1c at goal to prevent diabetic complications o Discussed higher dose of Trulicity prescribed by endocrinologist . Patient self care activities - Over the next 90 days, patient will: o Check blood sugar twice daily, document, and provide at future appointments o Contact provider with any episodes of hypoglycemia  Joint Pain . Pharmacist Clinical Goal(s) o Over the next 90 days, patient will work with PharmD and providers to  optimize OTC pain relievers . Current regimen:  o Ibuprofen up to 600 mg per day . Interventions: o Discussed Tylenol is safest OTC option, however it did not work for patient . Patient self care activities - Over the next 90 days, patient will: o Follow up with sports medicine regarding joint pain  Medication management . Pharmacist Clinical Goal(s): o Over the next 90 days, patient will work with PharmD and providers to achieve optimal medication adherence . Current [facility] . Interventions o Comprehensive medication review performed. o Continue current medication management strategy . Patient self care activities - Over the next 90 days, patient will: o Focus on medication adherence by pill box o Take medications as prescribed o Report any questions or concerns to PharmD and/or provider(s)  Please see past updates related to this goal by clicking on the "Past Updates" button in the selected goal         Hypertension   BP goal < 130/80  Office blood pressures are  BP Readings from Last 3 Encounters:  10/26/19 130/72  10/10/19 124/70  04/27/19 128/80   Kidney Function Lab Results  Component Value Date/Time   CREATININE 0.85 10/26/2019 09:03 AM   CREATININE 0.91 04/27/2019 10:40 AM   GFR 65.92 10/26/2019 09:03 AM   GFRNONAA >60 08/21/2015 04:09 AM   GFRAA >60 08/21/2015 04:09 AM   K 3.3 (L) 10/26/2019 09:03 AM   K 4.4 04/27/2019 10:40 AM   Patient has failed these meds in the past: n/a Patient is currently controlled on the following medications:   Losartan 100 mg daily  HCTZ 25 mg daily  Patient checks BP at home 1-2x per week  Patient home BP readings are ranging: 130/80s  We discussed diet and exercise extensively; BP goals.  Plan  Continue current medications and control with diet and exercise     Diabetes   A1c goal < 7% Dx: 2015  Recent Relevant Labs: Lab Results  Component Value Date/Time   HGBA1C 6.7 (A) 10/10/2019 09:42 AM    HGBA1C 7.2 (H) 04/27/2019 10:40 AM   HGBA1C 6.8 (H) 11/25/2018 08:49 AM   HGBA1C 6.7 04/21/2018 08:04 AM   GFR 65.92 10/26/2019 09:03 AM   GFR 61.02 04/27/2019 10:40 AM   MICROALBUR 3.5 (H) 02/04/2016 10:23 AM   MICROALBUR 0.8 11/30/2014 03:56 PM    Checking BG: Daily  Recent FBG Readings: 157-174 Before lunch: 133 After lunch: 247  Patient has failed these meds in past: Farxiga, Jardiance (mycotic infections), metformin, nateglinide, Januvia Patient is currently uncontrolled on the following medications:   Trulicity 4.5 mg once weekly (Wednesday)  Repaglinide 2 mg BID before meals (breakfast and dinner)  Last diabetic Eye exam:  Lab Results  Component Value Date/Time   HMDIABEYEEXA No Retinopathy 07/04/2019 12:00 AM    Last diabetic Foot exam: No results found for: HMDIABFOOTEX   We discussed: diet and exercise extensively; BG is still elevated; pt is currently using Trulicity 3 mg every 5 days to use it up quicker (as directed by Dr Loanne Drilling per pt); 2 injections left until 4.5 mg dose will start. Discussed her BG will likely improve once she starts the higher dose.   Plan  Continue current medications and control with diet and exercise    Hyperlipidemia   LDL < 100  Lipid Panel     Component Value Date/Time   CHOL 166 10/26/2019 0903   CHOL 333 (H) 03/15/2014 0810   TRIG 199.0 (H) 10/26/2019 0903   TRIG 263 (H) 03/15/2014 0810   HDL 48.90 10/26/2019 0903   HDL 63 03/15/2014 0810   CHOLHDL 3 10/26/2019 0903   VLDL 39.8 10/26/2019 0903   LDLCALC 78 10/26/2019 0903   LDLCALC 217 (H) 03/15/2014 0810   LDLDIRECT 108.0 04/27/2019 1040    The 10-year ASCVD risk score Mikey Bussing DC Jr., et al., 2013) is: 25.3%   Values used to calculate the score:     Age: 15 years     Sex: Female     Is Non-Hispanic African American: No     Diabetic: Yes     Tobacco smoker: No     Systolic Blood Pressure: 993 mmHg     Is BP treated: Yes     HDL Cholesterol: 48.9 mg/dL     Total  Cholesterol: 166 mg/dL   Patient has failed these meds in past: n/a Patient is currently uncontrolled on the following medications:   Atorvastatin 10 mg daily   We discussed:  diet and exercise extensively; cholesterol goals and benefit of statin for  ASCVD risk reduction.  Plan  Continue current medications and control with diet and exercise  Neuropathy   Patient has failed these meds in past: n/a Patient is currently controlled on the following medications:   Gabapentin 600 mg - 2 tablets HS  Gabapentin 100 mg - up to 3 capsules BID  We discussed:  Does not take 100 mg every day, usually just takes 1 if she needs it.   Plan  Continue current medications  Joint pain   Patient has failed these meds in past: n/a Patient is currently uncontrolled on the following medications:   Ibuprofen 200 - 600 mg as needed  Biofreeze  Voltaren gel  We discussed:  Pt tried extra strength Tylenol, did not help. Ibuprofen helps a little. Voltaren helps a lot when she uses it. Pt is interested in following up with sports medicine.  Plan  Continue current medications    Medication Management   [facility] Uses pill box? Yes Pt endorses 90% compliance  We discussed: [facility] with [facility]; pt was initially interested in switching to [facility] however her endocrinologist would rather she continue using [facility] while he is adjusting her medications. Pt is currently satisfied with pharmacy services.   Plan  Continue current medication management strategy    Follow up: 3 month phone visit  Charlene Brooke, PharmD Clinical Pharmacist [facility] [phone]

## 2019-11-29 NOTE — Patient Instructions (Addendum)
Visit Information  Phone number for Pharmacist: [phone]  Goals Addressed            This Visit's Progress   . Pharmacy Care Plan       CARE PLAN ENTRY  Current Barriers:  . Chronic Disease Management support, education, and care coordination needs related to Hypertension, Hyperlipidemia, and Diabetes   Hypertension BP Readings from Last 3 Encounters:  10/26/19 130/72  10/10/19 124/70  04/27/19 128/80 .  Pharmacist Clinical Goal(s): o Over the next 90 days, patient will work with PharmD and providers to maintain BP goal <130/80 . Current regimen:  o Losartan 100 mg daily o HCTZ 25 mg daily . Interventions: o Discussed benefits of monitoring BP at home . Patient self care activities - Over the next 90 days, patient will: o Check BP 1-2 times weekly, document, and provide at future appointments o Ensure daily salt intake < 2300 mg/day  Hyperlipidemia Lab Results  Component Value Date/Time   LDLCALC 78 10/26/2019 09:03 AM   LDLCALC 217 (H) 03/15/2014 08:10 AM   LDLDIRECT 108.0 04/27/2019 10:40 AM .  Pharmacist Clinical Goal(s): o Over the next 30 days, patient will work with PharmD and providers to achieve LDL goal < 100 . Current regimen:  o Atorvastatin 10 mg daily . Interventions: o Discussed cholesterol goals and benefits of statin regarding cardiovascular risk reduction o Discussed effects of statin on liver function; as long as liver function remains normal, the risk of liver damage is very low . Patient self care activities - Over the next 30 days, patient will: o Continue atorvastatin as prescribed o Continue low cholesterol diet  Diabetes Lab Results  Component Value Date/Time   HGBA1C 6.7 (A) 10/10/2019 09:42 AM   HGBA1C 7.2 (H) 04/27/2019 10:40 AM   HGBA1C 6.8 (H) 11/25/2018 08:49 AM   HGBA1C 6.7 04/21/2018 08:04 AM .  Pharmacist Clinical Goal(s): o Over the next 90 days, patient will work with PharmD and providers to achieve A1c goal  <7% . Current regimen:  o Trulicity 4.5 mg once weekly o Repaglinide 2 mg twice a day before breakfast and dinner . Interventions: o Discussed importance of maintaining A1c at goal to prevent diabetic complications o Discussed higher dose of Trulicity prescribed by endocrinologist . Patient self care activities - Over the next 90 days, patient will: o Check blood sugar twice daily, document, and provide at future appointments o Contact provider with any episodes of hypoglycemia  Joint Pain . Pharmacist Clinical Goal(s) o Over the next 90 days, patient will work with PharmD and providers to optimize OTC pain relievers . Current regimen:  o Ibuprofen up to 600 mg per day . Interventions: o Discussed Tylenol is safest OTC option, however it did not work for patient . Patient self care activities - Over the next 90 days, patient will: o Follow up with sports medicine regarding joint pain  Medication management . Pharmacist Clinical Goal(s): o Over the next 90 days, patient will work with PharmD and providers to achieve optimal medication adherence . [facility] . Interventions o Comprehensive medication review performed. o Continue current medication management strategy . Patient self care activities - Over the next 90 days, patient will: o Focus on medication adherence by pill box o Take medications as prescribed o Report any questions or concerns to PharmD and/or provider(s)  Please see past updates related to this goal by clicking on the "Past Updates" button in the selected goal  The patient verbalized understanding of instructions provided today and agreed to receive a mailed copy of patient instruction and/or educational materials.  Telephone follow up appointment with pharmacy team member scheduled for: 3 months  Charlene Brooke, PharmD Clinical Pharmacist [facility] [phone]  Carbohydrate Counting for Diabetes Mellitus,  Adult  Carbohydrate counting is a method of keeping track of how many carbohydrates you eat. Eating carbohydrates naturally increases the amount of sugar (glucose) in the blood. Counting how many carbohydrates you eat helps keep your blood glucose within normal limits, which helps you manage your diabetes (diabetes mellitus). It is important to know how many carbohydrates you can safely have in each meal. This is different for every person. A diet and nutrition specialist (registered dietitian) can help you make a meal plan and calculate how many carbohydrates you should have at each meal and snack. Carbohydrates are found in the following foods:  Grains, such as breads and cereals.  Dried beans and soy products.  Starchy vegetables, such as potatoes, peas, and corn.  Fruit and fruit juices.  Milk and yogurt.  Sweets and snack foods, such as cake, cookies, candy, chips, and soft drinks. How do I count carbohydrates? There are two ways to count carbohydrates in food. You can use either of the methods or a combination of both. Reading "Nutrition Facts" on packaged food The "Nutrition Facts" list is included on the labels of almost all packaged foods and beverages in the [place] It includes:  The serving size.  Information about nutrients in each serving, including the grams (g) of carbohydrate per serving. To use the "Nutrition Facts":  Decide how many servings you will have.  Multiply the number of servings by the number of carbohydrates per serving.  The resulting number is the total amount of carbohydrates that you will be having. Learning standard serving sizes of other foods When you eat carbohydrate foods that are not packaged or do not include "Nutrition Facts" on the label, you need to measure the servings in order to count the amount of carbohydrates:  Measure the foods that you will eat with a food scale or measuring cup, if needed.  Decide how many standard-size servings  you will eat.  Multiply the number of servings by 15. Most carbohydrate-rich foods have about 15 g of carbohydrates per serving. ? For example, if you eat 8 oz (170 g) of strawberries, you will have eaten 2 servings and 30 g of carbohydrates (2 servings x 15 g = 30 g).  For foods that have more than one food mixed, such as soups and casseroles, you must count the carbohydrates in each food that is included. The following list contains standard serving sizes of common carbohydrate-rich foods. Each of these servings has about 15 g of carbohydrates:   hamburger bun or  English muffin.   oz (15 mL) syrup.   oz (14 g) jelly.  1 slice of bread.  1 six-inch tortilla.  3 oz (85 g) cooked rice or pasta.  4 oz (113 g) cooked dried beans.  4 oz (113 g) starchy vegetable, such as peas, corn, or potatoes.  4 oz (113 g) hot cereal.  4 oz (113 g) mashed potatoes or  of a large baked potato.  4 oz (113 g) canned or frozen fruit.  4 oz (120 mL) fruit juice.  4-6 crackers.  6 chicken nuggets.  6 oz (170 g) unsweetened dry cereal.  6 oz (170 g) plain fat-free yogurt or  yogurt sweetened with artificial sweeteners.  8 oz (240 mL) milk.  8 oz (170 g) fresh fruit or one small piece of fruit.  24 oz (680 g) popped popcorn. Example of carbohydrate counting Sample meal  3 oz (85 g) chicken breast.  6 oz (170 g) brown rice.  4 oz (113 g) corn.  8 oz (240 mL) milk.  8 oz (170 g) strawberries with sugar-free whipped topping. Carbohydrate calculation 1. Identify the foods that contain carbohydrates: ? Rice. ? Corn. ? Milk. ? Strawberries. 2. Calculate how many servings you have of each food: ? 2 servings rice. ? 1 serving corn. ? 1 serving milk. ? 1 serving strawberries. 3. Multiply each number of servings by 15 g: ? 2 servings rice x 15 g = 30 g. ? 1 serving corn x 15 g = 15 g. ? 1 serving milk x 15 g = 15 g. ? 1 serving strawberries x 15 g = 15 g. 4. Add together all  of the amounts to find the total grams of carbohydrates eaten: ? 30 g + 15 g + 15 g + 15 g = 75 g of carbohydrates total. Summary  Carbohydrate counting is a method of keeping track of how many carbohydrates you eat.  Eating carbohydrates naturally increases the amount of sugar (glucose) in the blood.  Counting how many carbohydrates you eat helps keep your blood glucose within normal limits, which helps you manage your diabetes.  A diet and nutrition specialist (registered dietitian) can help you make a meal plan and calculate how many carbohydrates you should have at each meal and snack. This information is not intended to replace advice given to you by your health care provider. Make sure you discuss any questions you have with your health care provider. Document Revised: 11/26/2016 Document Reviewed: 10/16/2015 Elsevier Patient Education  [place].

## 2019-12-04 ENCOUNTER — Other Ambulatory Visit: Payer: Self-pay | Admitting: Internal Medicine

## 2019-12-04 DIAGNOSIS — M2550 Pain in unspecified joint: Secondary | ICD-10-CM

## 2019-12-04 NOTE — Progress Notes (Signed)
re

## 2019-12-07 ENCOUNTER — Other Ambulatory Visit: Payer: Self-pay

## 2019-12-07 ENCOUNTER — Ambulatory Visit: Payer: Medicare Other | Admitting: Family Medicine

## 2019-12-07 ENCOUNTER — Ambulatory Visit (INDEPENDENT_AMBULATORY_CARE_PROVIDER_SITE_OTHER): Payer: Medicare Other

## 2019-12-07 ENCOUNTER — Encounter: Payer: Self-pay | Admitting: Family Medicine

## 2019-12-07 VITALS — BP 122/78 | HR 80 | Ht 60.0 in | Wt 203.8 lb

## 2019-12-07 DIAGNOSIS — M2550 Pain in unspecified joint: Secondary | ICD-10-CM | POA: Diagnosis not present

## 2019-12-07 DIAGNOSIS — M79642 Pain in left hand: Secondary | ICD-10-CM | POA: Diagnosis not present

## 2019-12-07 DIAGNOSIS — M791 Myalgia, unspecified site: Secondary | ICD-10-CM

## 2019-12-07 DIAGNOSIS — M79641 Pain in right hand: Secondary | ICD-10-CM | POA: Diagnosis not present

## 2019-12-07 DIAGNOSIS — R718 Other abnormality of red blood cells: Secondary | ICD-10-CM

## 2019-12-07 DIAGNOSIS — M19041 Primary osteoarthritis, right hand: Secondary | ICD-10-CM | POA: Diagnosis not present

## 2019-12-07 DIAGNOSIS — M19042 Primary osteoarthritis, left hand: Secondary | ICD-10-CM | POA: Diagnosis not present

## 2019-12-07 MED ORDER — DULOXETINE HCL 20 MG PO CPEP
20.0000 mg | ORAL_CAPSULE | Freq: Every day | ORAL | 3 refills | Status: DC
Start: 2019-12-07 — End: 2019-12-15

## 2019-12-07 NOTE — Patient Instructions (Addendum)
Thank you for coming in today.  Get xrays and labs today.  I think hand PT/OT will help a lot.  Lets try cymbata also. Most will take it at bedtime.   Recheck with me in 1 month.

## 2019-12-07 NOTE — Progress Notes (Signed)
Subjective:    CC: Polyarthralgia  I, Wendy Poet, LAT, ATC, am serving as scribe for Dr. Lynne Leader.  HPI: Pt is a 69 y/o female presenting w/ c/o pain in multiple joints that began in Feb 2021.  Pain began in her R LE and then transitioned to her B ankles, L knee and has now progressed to her B wrists, fingers, R shoulder and elbow and lower back.  Today she is having pain in her B hands and wrists, R shoulder and R lower back.  She states that she has been dropping things.  She is a retired Fish farm manager.  She notes that she is dropping things and having trouble with grip and finds it very obnoxious.  Patient notes some triggering of her right 3rd digit.  Pertinent review of Systems: No fevers or chills  Relevant historical information: Hypertension, diabetes, peripheral neuropathy.   Objective:    Vitals:   12/07/19 1255  BP: 122/78  Pulse: 80  SpO2: 97%   General: Well Developed, well nourished, and in no acute distress.   MSK: C-spine normal-appearing normal cervical motion. Number extremity strength reflexes and sensation are intact distally. L-spine normal-appearing nontender normal lumbar motion.  Lower extremity strength reflexes sensation are intact distally. Hands bilaterally slight synovitis MCV with no ulnar deviation.  Diffusely tender.  Normal grip.   No palpable triggering right 3rd digit MCP.  Lab and Radiology Results X-ray images bilateral hands obtained today personally and independently reviewed.  Right hand: 1st CMC DJD.  2nd MCP DJD present.  No significant erosive arthritis therapy.  No fractures.  Left hand: No fractures.  1st CMC DJD.  No erosions or severe degenerative changes MCPs.  Await formal radiology review  Recent Results (from the past 2160 hour(s))  POCT HgB A1C     Status: Abnormal   Collection Time: 10/10/19  9:42 AM  Result Value Ref Range   Hemoglobin A1C 6.7 (A) 4.0 - 5.6 %   HbA1c POC (<> result, manual entry)       HbA1c, POC (prediabetic range)     HbA1c, POC (controlled diabetic range)    CBC with Differential/Platelet     Status: Abnormal   Collection Time: 10/26/19  9:03 AM  Result Value Ref Range   WBC 5.8 4.0 - 10.5 K/uL   RBC 4.84 3.87 - 5.11 Mil/uL   Hemoglobin 12.3 12.0 - 15.0 g/dL   HCT 36.3 36 - 46 %   MCV 74.9 (L) 78.0 - 100.0 fl   MCHC 33.8 30.0 - 36.0 g/dL   RDW 15.9 (H) 11.5 - 15.5 %   Platelets 307.0 150 - 400 K/uL   Neutrophils Relative % 60.9 43 - 77 %   Lymphocytes Relative 28.5 12 - 46 %   Monocytes Relative 6.8 3 - 12 %   Eosinophils Relative 3.2 0 - 5 %   Basophils Relative 0.6 0 - 3 %   Neutro Abs 3.5 1.4 - 7.7 K/uL   Lymphs Abs 1.7 0.7 - 4.0 K/uL   Monocytes Absolute 0.4 0 - 1 K/uL   Eosinophils Absolute 0.2 0 - 0 K/uL   Basophils Absolute 0.0 0 - 0 K/uL  Comprehensive metabolic panel     Status: Abnormal   Collection Time: 10/26/19  9:03 AM  Result Value Ref Range   Sodium 136 135 - 145 mEq/L   Potassium 3.3 (L) 3.5 - 5.1 mEq/L   Chloride 98 96 - 112 mEq/L   CO2  30 19 - 32 mEq/L   Glucose, Bld 131 (H) 70 - 99 mg/dL   BUN 12 6 - 23 mg/dL   Creatinine, Ser 0.85 0.40 - 1.20 mg/dL   Total Bilirubin 1.2 0.2 - 1.2 mg/dL   Alkaline Phosphatase 59 39 - 117 U/L   AST 20 0 - 37 U/L   ALT 27 0 - 35 U/L   Total Protein 7.0 6.0 - 8.3 g/dL   Albumin 4.2 3.5 - 5.2 g/dL   GFR 65.92 >60.00 mL/min   Calcium 10.1 8.4 - 10.5 mg/dL  Lipid panel     Status: Abnormal   Collection Time: 10/26/19  9:03 AM  Result Value Ref Range   Cholesterol 166 0 - 200 mg/dL    Comment: ATP III Classification       Desirable:  < 200 mg/dL               Borderline High:  200 - 239 mg/dL          High:  > = 240 mg/dL   Triglycerides 199.0 (H) 0 - 149 mg/dL    Comment: Normal:  <150 mg/dLBorderline High:  150 - 199 mg/dL   HDL 48.90 >39.00 mg/dL   VLDL 39.8 0.0 - 40.0 mg/dL   LDL Cholesterol 78 0 - 99 mg/dL   Total CHOL/HDL Ratio 3     Comment:                Men          Women1/2 Average  Risk     3.4          3.3Average Risk          5.0          4.42X Average Risk          9.6          7.13X Average Risk          15.0          11.0                       NonHDL 117.58     Comment: NOTE:  Non-HDL goal should be 30 mg/dL higher than patient's LDL goal (i.e. LDL goal of < 70 mg/dL, would have non-HDL goal of < 100 mg/dL)  ANA     Status: Abnormal   Collection Time: 10/26/19  9:03 AM  Result Value Ref Range   Anti Nuclear Antibody (ANA) POSITIVE (A) NEGATIVE    Comment: ANA IFA is a first line screen for detecting the presence of up to approximately 150 autoantibodies in various autoimmune diseases. A positive ANA IFA result is suggestive of autoimmune disease and reflexes to titer and pattern. Further laboratory testing may be considered if clinically indicated. . For additional information, please refer to http://education.[facility].com/faq/FAQ177 (This link is being provided for informational/ educational purposes only.) .   Rheumatoid factor     Status: None   Collection Time: 10/26/19  9:03 AM  Result Value Ref Range   Rhuematoid fact SerPl-aCnc <14 <14 IU/mL  Sedimentation rate     Status: None   Collection Time: 10/26/19  9:03 AM  Result Value Ref Range   Sed Rate 24 0 - 30 mm/hr  C-reactive protein     Status: None   Collection Time: 10/26/19  9:03 AM  Result Value Ref Range   CRP <1.0 0.5 - 20.0 mg/dL  B. burgdorfi antibodies  Status: None   Collection Time: 10/26/19  9:03 AM  Result Value Ref Range   B burgdorferi Ab IgG+IgM <0.90 index    Comment:                    Index                Interpretation                    -----                --------------                    < 0.90               Negative                    0.90-1.09            Equivocal                    > 1.09               Positive . As recommended by the Food and Drug Administration  (FDA), all samples with positive or equivocal  results in a Borrelia burgdorferi  antibody screen will be tested using a blot method. Positive or  equivocal screening test results should not be  interpreted as truly positive until verified as such  using a supplemental assay (e.g., B. burgdorferi blot). . The screening test and/or blot for B. burgdorferi  antibodies may be falsely negative in early stages of Lyme disease, including the period when erythema  migrans is apparent. Veto Kemps ab-titer (ANA titer)     Status: Abnormal   Collection Time: 10/26/19  9:03 AM  Result Value Ref Range   ANA Titer 1 1:40 (H) titer    Comment: A low level ANA titer may be present in pre-clinical autoimmune diseases and normal individuals.                 Reference Range                 <1:40        Negative                 1:40-1:80    Low Antibody Level                 >1:80        Elevated Antibody Level .    ANA Pattern 1 Nuclear, Homogeneous (A)     Comment: Homogeneous pattern is associated with systemic lupus erythematosus (SLE), drug-induced lupus and juvenile idiopathic arthritis. . AC-1: Homogeneous . International Consensus on ANA Patterns (https://www.hernandez-brewer.com/)      Impression and Recommendations:    Assessment and Plan: 69 y.o. female with bilateral hand pain associated with myalgias and arthralgias.  Patient had reasonable work-up with PCP.  Labs only showed mildly elevated ANA but with a 1:40 titer meaning less likely to be diagnostic.  Additionally she had a slightly low MCV but normal hemoglobin.  I would like to be able to explain all of her symptoms with one diagnosis and still suspicious for some systemic rheumatologic or inflammatory process.  Plan to complete work-up with lupus panel, CK, and angiotensin-converting enzyme titer, and uric acid.  Will work-up low MCV with transferrin and ferritin. We will try treatment with Cymbalta and  occupational therapy.  Recheck back in a month.   Orders Placed This Encounter    Procedures  . DG Hand Complete Left    Standing Status:   Future    Standing Expiration Date:   12/06/2020    Order Specific Question:   Reason for Exam (SYMPTOM  OR DIAGNOSIS REQUIRED)    Answer:   eval hand pain    Order Specific Question:   Preferred imaging location?    Answer:   [facility]    Order Specific Question:   Radiology Contrast Protocol - do NOT remove file path    Answer:   \\[email or web address]\DXFluoroContrastProtocols.pdf  . DG Hand Complete Right    Standing Status:   Future    Standing Expiration Date:   12/06/2020    Order Specific Question:   Reason for Exam (SYMPTOM  OR DIAGNOSIS REQUIRED)    Answer:   eval hand pain    Order Specific Question:   Preferred imaging location?    Answer:   [facility]    Order Specific Question:   Radiology Contrast Protocol - do NOT remove file path    Answer:   \\[email or web address]\DXFluoroContrastProtocols.pdf  . CK    Standing Status:   Future    Standing Expiration Date:   12/06/2020  . LUPUS(12) PANEL    Standing Status:   Future    Standing Expiration Date:   12/06/2020  . Angiotensin converting enzyme    Standing Status:   Future    Standing Expiration Date:   12/06/2020  . Complement, total    Standing Status:   Future    Standing Expiration Date:   12/06/2020  . Uric acid    Standing Status:   Future    Standing Expiration Date:   12/06/2020  . Transferrin    Standing Status:   Future    Standing Expiration Date:   12/06/2020  . Ferritin    Standing Status:   Future    Standing Expiration Date:   12/06/2020  . Ambulatory referral to Occupational Therapy    Referral Priority:   Routine    Referral Type:   Occupational Therapy    Referral Reason:   Specialty Services Required    Requested Specialty:   Occupational Therapy    Number of Visits Requested:   1   Meds ordered this encounter  Medications  . DULoxetine (CYMBALTA) 20 MG capsule    Sig: Take 1 capsule (20 mg total) by  mouth daily.    Dispense:  30 capsule    Refill:  3    Discussed warning signs or symptoms. Please see discharge instructions. Patient expresses understanding.   The above documentation has been reviewed and is accurate and complete Lynne Leader, M.D.

## 2019-12-12 LAB — LUPUS(12) PANEL
Anti Nuclear Antibody (ANA): POSITIVE — AB
C3 Complement: 161 mg/dL (ref 83–193)
C4 Complement: 34 mg/dL (ref 15–57)
ENA SM Ab Ser-aCnc: <1 AI
Rheumatoid fact SerPl-aCnc: <14 IU/mL (ref <14)
Ribosomal P Protein Ab: <1 AI
SM/RNP: <1 AI
SSA (Ro) (ENA) Antibody, IgG: <1 AI
SSB (La) (ENA) Antibody, IgG: <1 AI
Scleroderma (Scl-70) (ENA) Antibody, IgG: <1 AI
Thyroperoxidase Ab SerPl-aCnc: <1 IU/mL (ref <9)
ds DNA Ab: 1 IU/mL

## 2019-12-12 LAB — ANGIOTENSIN CONVERTING ENZYME: Angiotensin-Converting Enzyme: 25 U/L (ref 9–67)

## 2019-12-12 LAB — TRANSFERRIN: Transferrin: 372 mg/dL — ABNORMAL HIGH (ref 188–341)

## 2019-12-12 LAB — CK: Total CK: 77 U/L (ref 29–143)

## 2019-12-12 LAB — ANTI-NUCLEAR AB-TITER (ANA TITER)
ANA TITER: 1:80 {titer} — ABNORMAL HIGH
ANA Titer 1: 1:40 {titer} — ABNORMAL HIGH

## 2019-12-12 LAB — EXTRA SPECIMEN

## 2019-12-12 LAB — FERRITIN: Ferritin: 8 ng/mL — ABNORMAL LOW (ref 16–288)

## 2019-12-12 LAB — COMPLEMENT, TOTAL: Compl, Total (CH50): >60 U/mL — ABNORMAL HIGH (ref 31–60)

## 2019-12-12 LAB — URIC ACID: Uric Acid, Serum: 5.1 mg/dL (ref 2.5–7.0)

## 2019-12-15 ENCOUNTER — Other Ambulatory Visit: Payer: Self-pay

## 2019-12-15 ENCOUNTER — Telehealth (INDEPENDENT_AMBULATORY_CARE_PROVIDER_SITE_OTHER): Payer: Medicare Other | Admitting: Endocrinology

## 2019-12-15 DIAGNOSIS — E1142 Type 2 diabetes mellitus with diabetic polyneuropathy: Secondary | ICD-10-CM | POA: Diagnosis not present

## 2019-12-15 NOTE — Progress Notes (Addendum)
Subjective:    Patient ID: Heather [name], female    DOB: December 12, 1950, 69 y.o.   MRN: [id number]  HPI  telehealth visit today via phone x 7 minutes Alternatives to telehealth are presented to this patient, and the patient agrees to the telehealth visit.  Pt is advised of the cost of the visit, and agrees to this, also.   Patient is at home, and I am at the office.   Persons attending the telehealth visit: the patient and I Pt returns for f/u of diabetes mellitus: DM type: 2 Dx'ed: 6811 Complications: PN.   Therapy: Trulicity and repaglinide.   GDM: never DKA: never Severe hypoglycemia: never.  Pancreatitis: never Other: she has never been on insulin; short bowel syndrome precludes metformin; she did not tolerate [place] or Jardiance (vaginitis); edema precludes pioglitazone.   Interval history: she has mild nausea.  she says cbg's vary from 90-196.   Past Medical History:  Diagnosis Date  . Anemia   . Colon polyps   . Diabetes mellitus without complication ([place])   . Environmental allergies   . GERD (gastroesophageal reflux disease)   . Heart murmur   . History of DVT (deep vein thrombosis) 1970'S  . History of hiatal hernia   . History of transfusion   . Hyperlipidemia   . Hypertension   . Interstitial cystitis    Dr Janice Norrie  . Shortness of breath dyspnea    "due to low hemaglobin"    Past Surgical History:  Procedure Laterality Date  . APPENDECTOMY    . BILATERAL SALPINGOOPHORECTOMY     painful cysts  . COLONOSCOPY  2005   negative; Dr Earlean Shawl. F/U declined  . CYSTOSCOPY     X 4; Dr Janice Norrie  . LAPAROSCOPIC RIGHT COLECTOMY Right 08/19/2015   Procedure: LAPAROSCOPIC ASSISTED  RIGHT COLECTOMY;  Surgeon: Alphonsa Overall, MD;  Location: [facility];  Service: General;  Laterality: Right;  . PREMAGLINANT POLYPS    . TOTAL ABDOMINAL HYSTERECTOMY     metromenorrhagia    Social History   Socioeconomic History  . Marital status: Widowed    Spouse name: Not on file  . Number of  children: 2  . Years of education: Not on file  . Highest education level: Not on file  Occupational History  . Not on file  Tobacco Use  . Smoking status: Former Smoker    Quit date: 05/18/1984    Years since quitting: 35.6  . Smokeless tobacco: Never Used  . Tobacco comment: smoked 1972-1986, up to 1 ppd  Vaping Use  . Vaping Use: Never used  Substance and Sexual Activity  . Alcohol use: No    Alcohol/week: 0.0 standard drinks  . Drug use: No  . Sexual activity: Never  Other Topics Concern  . Not on file  Social History Narrative   Lives with brother, daughter and 2 grandchildren in a 3 story home.  Has 2 children.  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     Highest level of education:  2 years of graduate school   Social Determinants of Health   Financial Resource Strain: Low Risk   . Difficulty of Paying Living Expenses: Not hard at all  Food Insecurity:   . Worried About Charity fundraiser in the Last Year:   . Arboriculturist in the Last Year:   Transportation Needs:   . Film/video editor (Medical):   Marland Kitchen  Lack of Transportation (Non-Medical):   Physical Activity:   . Days of Exercise per Week:   . Minutes of Exercise per Session:   Stress:   . Feeling of Stress :   Social Connections:   . Frequency of Communication with Friends and Family:   . Frequency of Social Gatherings with Friends and Family:   . Attends Religious Services:   . Active Member of Clubs or Organizations:   . Attends Archivist Meetings:   Marland Kitchen Marital Status:   Intimate Partner Violence:   . Fear of Current or Ex-Partner:   . Emotionally Abused:   Marland Kitchen Physically Abused:   . Sexually Abused:     Current Outpatient Medications on File Prior to Visit  Medication Sig Dispense Refill  . atorvastatin (LIPITOR) 10 MG tablet Take 1 tablet (10 mg total) by mouth daily. 90 tablet 1  . Blood Glucose Monitoring Suppl (ONE TOUCH ULTRA 2)  w/Device KIT 1 each by Does not apply route 4 (four) times daily. Use device to monitor glucose levels 4 times per day; E11.9 (Patient taking differently: 1 each by Does not apply route daily. Use device to monitor glucose levels 4 times per day; E11.9) 1 each 0  . Cyanocobalamin 2500 MCG TABS Take 2,500 mcg by mouth daily.     . Dulaglutide (TRULICITY) 4.5 OI/7.1IW SOPN Inject 4.5 mg as directed once a week. 12 pen 3  . Famotidine (PEPCID PO) 1 tablet as needed.     . gabapentin (NEURONTIN) 100 MG capsule START WITH 1 CAPSULE IN MORNING AND AFTERNOON, THEN INCREASE TO 3 CAPSULES TWICE A DAY IF TOLERATED. Dx E08.42 180 capsule 1  . gabapentin (NEURONTIN) 600 MG tablet Take 2 tablets (1,200 mg total) by mouth at bedtime. Dx E08.42 180 tablet 1  . glucose blood (ONETOUCH ULTRA) test strip 1 each by Other route daily. And lancets 1/day 100 each 3  . hydrochlorothiazide (HYDRODIURIL) 25 MG tablet Take 1 tablet (25 mg total) by mouth daily. 90 tablet 1  . losartan (COZAAR) 100 MG tablet Take 1 tablet (100 mg total) by mouth daily. 90 tablet 1  . multivitamin-lutein (OCUVITE-LUTEIN) CAPS capsule Take 1 capsule by mouth daily.    . ONE TOUCH LANCETS MISC 1 Device by Other route daily. Use to test blood sugars once daily 90 each 3  . repaglinide (PRANDIN) 2 MG tablet Take 1 tablet (2 mg total) by mouth 2 (two) times daily before a meal. 180 tablet 3   No current facility-administered medications on file prior to visit.    Allergies  Allergen Reactions  . Farxiga [Dapagliflozin]     Constant Vaginal Infections  . Flagyl [Metronidazole] Other (See Comments)    Made really sick. Was able to tolerate dose 4/17 with zofran  . Jardiance [Empagliflozin]     Constant Vaginal infections  . Lisinopril     04/03/15 cough reported X several months  . Cholestyramine Other (See Comments)    Joint pain  . Sulfonamide Derivatives     nausea    Family History  Problem Relation Age of Onset  . Heart attack  Mother        in 19s  . Hypertension Mother   . Stroke Mother         in 33s  . Other Father        Deceased, 46  . Stroke Maternal Grandfather 50  . Heart attack Paternal Grandfather        in 32s  .  Breast cancer Sister   . Healthy Brother   . Healthy Son   . Healthy Daughter   . Diabetes Neg Hx     There were no vitals taken for this visit.   Review of Systems She denies hypoglycemia.      Objective:   Physical Exam       Assessment & Plan:  Type 2 DM: apparently well-controlled Nausea, due to Trulicity. As it is mild, we'll continue.    Patient Instructions  Please come in to have the A1c checked.    check your blood sugar once a day.  vary the time of day when you check, between before the 3 meals, and at bedtime.  also check if you have symptoms of your blood sugar being too high or too low.  please keep a record of the readings and bring it to your next appointment here (or you can bring the meter itself).  You can write it on any piece of paper.  please call us sooner if your blood sugar goes below 70, or if you have a lot of readings over 200. Please come back for a follow-up appointment in 3-4 months.

## 2019-12-15 NOTE — Patient Instructions (Addendum)
Please come in to have the A1c checked.    check your blood sugar once a day.  vary the time of day when you check, between before the 3 meals, and at bedtime.  also check if you have symptoms of your blood sugar being too high or too low.  please keep a record of the readings and bring it to your next appointment here (or you can bring the meter itself).  You can write it on any piece of paper.  please call us sooner if your blood sugar goes below 70, or if you have a lot of readings over 200. Please come back for a follow-up appointment in 3-4 months.

## 2019-12-18 ENCOUNTER — Encounter: Payer: Self-pay | Admitting: Endocrinology

## 2019-12-18 ENCOUNTER — Other Ambulatory Visit (INDEPENDENT_AMBULATORY_CARE_PROVIDER_SITE_OTHER): Payer: Medicare Other

## 2019-12-18 DIAGNOSIS — E1142 Type 2 diabetes mellitus with diabetic polyneuropathy: Secondary | ICD-10-CM | POA: Diagnosis not present

## 2019-12-18 LAB — HEMOGLOBIN A1C: Hgb A1c MFr Bld: 7.3 % — ABNORMAL HIGH (ref 4.6–6.5)

## 2019-12-18 NOTE — Progress Notes (Signed)
Main finding is significant iron deficiency.  Although you are not anemic your iron stores are quite low.  Please start taking over-the-counter iron sulfate 325 twice daily if able. Lupus panel is only minimally positive for ANA as we discussed previously.  We will discuss these results in further detail on August 24.

## 2019-12-18 NOTE — Progress Notes (Signed)
Osteoarthritis is present most severe at the base of the right thumb.

## 2019-12-18 NOTE — Progress Notes (Signed)
Osteoarthritis is present most severe at the base of the thumb of the left hand.

## 2019-12-29 DIAGNOSIS — Z1231 Encounter for screening mammogram for malignant neoplasm of breast: Secondary | ICD-10-CM | POA: Diagnosis not present

## 2019-12-29 LAB — HM MAMMOGRAPHY

## 2020-01-09 ENCOUNTER — Ambulatory Visit: Payer: Self-pay

## 2020-01-09 ENCOUNTER — Ambulatory Visit: Payer: Medicare Other | Admitting: Family Medicine

## 2020-01-09 ENCOUNTER — Other Ambulatory Visit: Payer: Self-pay

## 2020-01-09 ENCOUNTER — Encounter: Payer: Self-pay | Admitting: Family Medicine

## 2020-01-09 VITALS — BP 120/70 | HR 78 | Ht 60.0 in | Wt 199.0 lb

## 2020-01-09 DIAGNOSIS — M79642 Pain in left hand: Secondary | ICD-10-CM

## 2020-01-09 DIAGNOSIS — M65331 Trigger finger, right middle finger: Secondary | ICD-10-CM | POA: Diagnosis not present

## 2020-01-09 DIAGNOSIS — M79641 Pain in right hand: Secondary | ICD-10-CM

## 2020-01-09 DIAGNOSIS — M79644 Pain in right finger(s): Secondary | ICD-10-CM | POA: Diagnosis not present

## 2020-01-09 DIAGNOSIS — M2550 Pain in unspecified joint: Secondary | ICD-10-CM | POA: Diagnosis not present

## 2020-01-09 MED ORDER — DICLOFENAC SODIUM 1 % EX GEL
4.0000 g | Freq: Four times a day (QID) | CUTANEOUS | 11 refills | Status: DC
Start: 2020-01-09 — End: 2020-12-11

## 2020-01-09 NOTE — Patient Instructions (Addendum)
You had a L middle finger trigger finger injection today.  Call or go to the ER if you develop a large red swollen joint with extreme pain or oozing puss.   Use the double bandaid splint.  Use heat and home exercises for trigger finger nad hand arthritis.  Consider [facility].

## 2020-01-09 NOTE — Progress Notes (Signed)
I, Wendy Poet, LAT, ATC, am serving as scribe for Dr. Lynne Leader.  Heather [name] is a 69 y.o. female who presents to [place] at [facility] today for f/u of B hand pain.  She was last seen by Dr. Georgina Snell on 12/07/19 and was prescribed Cymbalta and referred to hand therapy, either OT or PT.  The pt has not completed any therapy due to the cost.  Since her last visit, pt reports that she is not taking the Cymbalta because she already takes Gabapentin at night and was concerned about the drowsiness side effects.  Her R hand is bothering her more than the L today.  She notes triggering in her R middle finger.  Diagnostic imaging: B hand XR- 12/07/19   Pertinent review of systems: No fevers or chills  Relevant historical information: Diabetes hypertension   Exam:  BP 120/70 (BP Location: Right Arm, Patient Position: Sitting, Cuff Size: Normal)   Pulse 78   Ht 5' (1.524 m)   Wt 199 lb (90.3 kg)   SpO2 96%   BMI 38.86 kg/m  General: Well Developed, well nourished, and in no acute distress.   MSK: Hands bilaterally slight swelling MCPs.  Triggering present right third digit.     Lab and Radiology Results Procedure: Real-time Ultrasound Guided Injection of right third MCP palmar trigger finger A1 pulley Device: Philips Affiniti 50G Images permanently stored and available for review in PACS Verbal informed consent obtained.  Discussed risks and benefits of procedure. Warned about infection bleeding damage to structures skin hypopigmentation and fat atrophy among others. Patient expresses understanding and agreement Time-out conducted.   Noted no overlying erythema, induration, or other signs of local infection.   Skin prepped in a sterile fashion.   Local anesthesia: Topical Ethyl chloride.   With sterile technique and under real time ultrasound guidance:  40 mg of Depo-Medrol and 0.5 mL of lidocaine.  Total volume 1 mL injected easily.   Completed without  difficulty   Pain immediately resolved suggesting accurate placement of the medication.   Advised to call if fevers/chills, erythema, induration, drainage, or persistent bleeding.   Images permanently stored and available for review in the ultrasound unit.  Impression: Technically successful ultrasound guided injection.         Assessment and Plan: 69 y.o. female with hand pain due to arthritis.  Doubtful for rheumatologic disease based on relatively normal rheum work-up.  ANA was mildly positive but lupus panel and other rheumatologic labs were negative.  Plan for home exercise as formal hand therapy is too expensive for her.  Recommend heating pad and range of motion work.  YouTube may be a good resource.  Consider [facility].  Additionally Voltaren gel.  Trigger finger.  Plan for double Band-Aid splint and injection as above.  Again home exercise program and recheck back as needed.    Orders Placed This Encounter  Procedures  . [place] LIMITED JOINT SPACE STRUCTURES UP RIGHT(NO LINKED CHARGES)    Order Specific Question:   Reason for Exam (SYMPTOM  OR DIAGNOSIS REQUIRED)    Answer:   trigger finger right middle finger    Order Specific Question:   Preferred imaging location?    Answer:   [place]   Meds ordered this encounter  Medications  . diclofenac Sodium (VOLTAREN) 1 % GEL    Sig: Apply 4 g topically 4 (four) times daily. To affected joint.  Dispense:  400 g    Refill:  11    KNee OA tried oral ibuprofen and aleve     Discussed warning signs or symptoms. Please see discharge instructions. Patient expresses understanding.   The above documentation has been reviewed and is accurate and complete Lynne Leader, M.D.

## 2020-02-16 ENCOUNTER — Ambulatory Visit: Payer: Medicare Other | Admitting: Pharmacist

## 2020-02-16 ENCOUNTER — Other Ambulatory Visit: Payer: Self-pay

## 2020-02-16 DIAGNOSIS — E782 Mixed hyperlipidemia: Secondary | ICD-10-CM

## 2020-02-16 DIAGNOSIS — E1142 Type 2 diabetes mellitus with diabetic polyneuropathy: Secondary | ICD-10-CM

## 2020-02-16 DIAGNOSIS — I10 Essential (primary) hypertension: Secondary | ICD-10-CM

## 2020-02-16 NOTE — Patient Instructions (Addendum)
Visit Information  Phone number for Pharmacist: [phone]  Goals Addressed            This Visit's Progress   . Pharmacy Care Plan       CARE PLAN ENTRY  Current Barriers:  . Chronic Disease Management support, education, and care coordination needs related to Hypertension, Hyperlipidemia, and Diabetes   Hypertension BP Readings from Last 3 Encounters:  10/26/19 130/72  10/10/19 124/70  04/27/19 128/80 .  Pharmacist Clinical Goal(s): o Over the next 180 days, patient will work with PharmD and providers to maintain BP goal <130/80 . Current regimen:  o Losartan 100 mg daily o HCTZ 25 mg daily . Interventions: o Discussed benefits of monitoring BP at home . Patient self care activities - Over the next 180 days, patient will: o Check BP 1-2 times weekly, document, and provide at future appointments o Ensure daily salt intake < 2300 mg/day  Hyperlipidemia Lab Results  Component Value Date/Time   LDLCALC 78 10/26/2019 09:03 AM   LDLCALC 217 (H) 03/15/2014 08:10 AM   LDLDIRECT 108.0 04/27/2019 10:40 AM .  Pharmacist Clinical Goal(s): o Over the next 180 days, patient will work with PharmD and providers to achieve LDL goal < 100 . Current regimen:  o Atorvastatin 10 mg daily . Interventions: o Discussed cholesterol goals and benefits of statin regarding cardiovascular risk reduction o Discussed effects of statin on liver function; as long as liver function remains normal, the risk of liver damage is very low . Patient self care activities - Over the next 180 days, patient will: o Continue atorvastatin as prescribed o Continue low cholesterol diet  Diabetes Lab Results  Component Value Date/Time   HGBA1C 6.7 (A) 10/10/2019 09:42 AM   HGBA1C 7.2 (H) 04/27/2019 10:40 AM   HGBA1C 6.8 (H) 11/25/2018 08:49 AM   HGBA1C 6.7 04/21/2018 08:04 AM .  Pharmacist Clinical Goal(s): o Over the next 180 days, patient will work with PharmD and providers to achieve A1c goal  <7% . Current regimen:  o Trulicity 4.5 mg once weekly o Repaglinide 2 mg twice a day before breakfast and dinner . Interventions: o Discussed importance of maintaining A1c at goal to prevent diabetic complications o Discussed blood sugar will likely decrease with exercise and weight loss; advised to carry snacks on walks in case of low blodod sugar . Patient self care activities - Over the next 180 days, patient will: o Check blood sugar twice daily, document, and provide at future appointments o Contact provider with any episodes of hypoglycemia o Carry snacks on walks  Medication management . Pharmacist Clinical Goal(s): o Over the next 180 days, patient will work with PharmD and providers to achieve optimal medication adherence . [facility] . Interventions o Comprehensive medication review performed. o Continue current medication management strategy . Patient self care activities - Over the next 180 days, patient will: o Focus on medication adherence by pill box o Take medications as prescribed o Report any questions or concerns to PharmD and/or provider(s)  Please see past updates related to this goal by clicking on the "Past Updates" button in the selected goal        Patient verbalizes understanding of instructions provided today.  Telephone follow up appointment with pharmacy team member scheduled for: 6 months  Al Corpus, PharmD, BCACP Clinical Pharmacist [facility] [phone]  Preventing Hypoglycemia Hypoglycemia occurs when the level of sugar (glucose) in the blood is too low. Hypoglycemia can happen  in people who do or do not have diabetes (diabetes mellitus). It can develop quickly, and it can be a medical emergency. For most people with diabetes, a blood glucose level below 70 mg/dL (3.9 mmol/L) is considered hypoglycemia. Glucose is a type of sugar that provides the body's main source of energy. Certain hormones  (insulin and glucagon) control the level of glucose in the blood. Insulin lowers blood glucose, and glucagon increases blood glucose. Hypoglycemia can result from having too much insulin in the bloodstream, or from not eating enough food that contains glucose. Your risk for hypoglycemia is higher:  If you take insulin or diabetes medicines to help lower your blood glucose or help your body make more insulin.  If you skip or delay a meal or snack.  If you are ill.  During and after exercise. You can prevent hypoglycemia by working with your health care provider to adjust your meal plan as needed and by taking other precautions. How can hypoglycemia affect me? Mild symptoms Mild hypoglycemia may not cause any symptoms. If you do have symptoms, they may include:  Hunger.  Anxiety.  Sweating and feeling clammy.  Dizziness or feeling light-headed.  Sleepiness.  Nausea.  Increased heart rate.  Headache.  Blurry vision.  Irritability.  Tingling or numbness around the mouth, lips, or tongue.  A change in coordination.  Restless sleep. If mild hypoglycemia is not recognized and treated, it can quickly become moderate or severe hypoglycemia. Moderate symptoms Moderate hypoglycemia can cause:  Mental confusion and poor judgment.  Behavior changes.  Weakness.  Irregular heartbeat. Severe symptoms Severe hypoglycemia is a medical emergency. It can cause:  Fainting.  Seizures.  Loss of consciousness (coma).  Death. What nutrition changes can be made?  Work with your health care provider or diet and nutrition specialist (dietitian) to make a healthy meal plan that is right for you. Follow your meal plan carefully.  Eat meals at regular times.  If recommended by your health care provider, have snacks between meals.  Donot skip or delay meals or snacks. You can be at risk for hypoglycemia if you are not getting enough carbohydrates. What lifestyle changes can be  made?   Work closely with your health care provider to manage your blood glucose. Make sure you know: ? Your goal blood glucose levels. ? How and when to check your blood glucose. ? The symptoms of hypoglycemia. It is important to treat it right away to keep it from becoming severe.  Do not drink alcohol on an empty stomach.  When you are ill, check your blood glucose more often than usual. Follow your sick day plan whenever you cannot eat or drink normally. Make this plan in advance with your health care provider.  Always check your blood glucose before, during, and after exercise. How is this treated? This condition can often be treated by immediately eating or drinking something that contains sugar, such as:  Fruit juice, 4-6 oz (120-150 mL).  Regular (not diet) soda, 4-6 oz (120-150 mL).  Low-fat milk, 4 oz (120 mL).  Several pieces of hard candy.  Sugar or honey, 1 Tbsp (15 mL). Treating hypoglycemia if you have diabetes If you are alert and able to swallow safely, follow the 15:15 rule:  Take 15 grams of a rapid-acting carbohydrate. Talk with your health care provider about how much you should take.  Rapid-acting options include: ? Glucose pills (take 15 grams). ? 6-8 pieces of hard candy. ? 4-6 oz (120-150 mL)  of fruit juice. ? 4-6 oz (120-150 mL) of regular (not diet) soda.  Check your blood glucose 15 minutes after you take the carbohydrate.  If the repeat blood glucose level is still at or below 70 mg/dL (3.9 mmol/L), take 15 grams of a carbohydrate again.  If your blood glucose level does not increase above 70 mg/dL (3.9 mmol/L) after 3 tries, seek emergency medical care.  After your blood glucose level returns to normal, eat a meal or a snack within 1 hour. Treating severe hypoglycemia Severe hypoglycemia is when your blood glucose level is at or below 54 mg/dL (3 mmol/L). Severe hypoglycemia is a medical emergency. Get medical help right away. If you have  severe hypoglycemia and you cannot eat or drink, you may need an injection of glucagon. A family member or close friend should learn how to check your blood glucose and how to give you a glucagon injection. Ask your health care provider if you need to have an emergency glucagon injection kit available. Severe hypoglycemia may need to be treated in a hospital. The treatment may include getting glucose through an IV. You may also need treatment for the cause of your hypoglycemia. Where to find more information  American Diabetes Association: www.diabetes.[employer] of Diabetes and Digestive and Kidney Diseases: [employer] Contact a health care provider if:  You have problems keeping your blood glucose in your target range.  You have frequent episodes of hypoglycemia. Get help right away if:  You continue to have hypoglycemia symptoms after eating or drinking something containing glucose.  Your blood glucose level is at or below 54 mg/dL (3 mmol/L).  You faint.  You have a seizure. These symptoms may represent a serious problem that is an emergency. Do not wait to see if the symptoms will go away. Get medical help right away. Call your local emergency services (911 in the [place] Summary  Know the symptoms of hypoglycemia, and when you are at risk for it (such as during exercise or when you are sick). Check your blood glucose often when you are at risk for hypoglycemia.  Hypoglycemia can develop quickly, and it can be dangerous if it is not treated right away. If you have a history of severe hypoglycemia, make sure you know how to use your glucagon injection kit.  Make sure you know how to treat hypoglycemia. Keep a carbohydrate snack available when you may be at risk for hypoglycemia. This information is not intended to replace advice given to you by your health care provider. Make sure you discuss any questions you have with your health care provider. Document Revised:  08/26/2018 Document Reviewed: 12/30/2016 Elsevier Patient Education  [place].

## 2020-02-16 NOTE — Chronic Care Management (AMB) (Signed)
Chronic Care Management Pharmacy  Name: Heather [name]  MRN: [id number] DOB: [birth year]  Chief Complaint/ HPI  Heather [name],  69 y.o. , female presents for their Follow-Up CCM visit with the clinical pharmacist via telephone due to COVID-19 Pandemic.  PCP : Binnie Rail, MD  Their chronic conditions include: HTN, T2DM, osteoarthritis, HLD  Pt is a widow, husband died with liver complications, she is worried about damage to her own liver. She lives alone with her dog. She enjoys working in the yard. She pet sits for neighbors and friends.  In "Morph" study through [facility] - wt loss/exercise and pain management. Gets a FitBit, a scale, and potentially a tablet.    Office Visits: 10/26/19 Dr Quay Burow OV: joint pain started 6 months ago, multiple joints, tested for Lyme disease and autoimmune cause - negative.  04/27/19 Dr Quay Burow OV: uncontrolled neuropathy. Start gabapentin 100 mg and increased if needed. May decreased B12 supplement due to high-normal lab results. Rec'd Vitamin B complex for neuropathy as well.  Consult Visit: 01/09/20 Dr Georgina Snell (sports med): ANA mildly positive, lupus panel negative, doubtful for rheumatologic disease. Advised home exercise for hand + Voltaren gel. Trigger point injection given. Consider [place] [facility].  12/15/19 Dr Loanne Drilling (endocrine): no med changes, advised to come in for A1c. A1c up slightly, suggested adding Welcohol, pt declined due to previously failed drug.  12/07/19 Dr Georgina Snell (sports med): iron deficiency, advised to take iron supplement. rx'd duloxetone 20 mg + Hand PT/OT  10/10/19 Dr Loanne Drilling (endocrine): reduced repaglinide to BID due to afternoon lows; Increased Trulicity to 4.5 mg weekly  06/09/19 Dr Loanne Drilling (endocrine): short bowel syndrome limits metformin usage; did not tolerate SGLT2 (vaginitis); edema precludes pioglitazone. Increased Trulicity 1.5 - 3 mg weekly.  Allergies  Allergen Reactions  . Farxiga [Dapagliflozin]      Constant Vaginal Infections  . Flagyl [Metronidazole] Other (See Comments)    Made really sick. Was able to tolerate dose 4/17 with zofran  . Jardiance [Empagliflozin]     Constant Vaginal infections  . Lisinopril     04/03/15 cough reported X several months  . Cholestyramine Other (See Comments)    Joint pain  . Sulfonamide Derivatives     nausea    Medications: Outpatient Encounter Medications as of 02/16/2020  Medication Sig  . atorvastatin (LIPITOR) 10 MG tablet Take 1 tablet (10 mg total) by mouth daily.  . Blood Glucose Monitoring Suppl (ONE TOUCH ULTRA 2) w/Device KIT 1 each by Does not apply route 4 (four) times daily. Use device to monitor glucose levels 4 times per day; E11.9 (Patient taking differently: 1 each by Does not apply route daily. Use device to monitor glucose levels 4 times per day; E11.9)  . Cyanocobalamin 2500 MCG TABS Take 2,500 mcg by mouth daily.   . diclofenac Sodium (VOLTAREN) 1 % GEL Apply 4 g topically 4 (four) times daily. To affected joint.  . Dulaglutide (TRULICITY) 4.5 YK/9.9IP SOPN Inject 4.5 mg as directed once a week.  . Famotidine (PEPCID PO) 1 tablet as needed.   . gabapentin (NEURONTIN) 100 MG capsule START WITH 1 CAPSULE IN MORNING AND AFTERNOON, THEN INCREASE TO 3 CAPSULES TWICE A DAY IF TOLERATED. Dx J82.50  . gabapentin (NEURONTIN) 600 MG tablet Take 2 tablets (1,200 mg total) by mouth at bedtime. Dx N39.76  . glucose blood (ONETOUCH ULTRA) test strip 1 each by Other route daily. And lancets 1/day  . hydrochlorothiazide (HYDRODIURIL) 25 MG tablet Take  1 tablet (25 mg total) by mouth daily.  Marland Kitchen losartan (COZAAR) 100 MG tablet Take 1 tablet (100 mg total) by mouth daily.  . multivitamin-lutein (OCUVITE-LUTEIN) CAPS capsule Take 1 capsule by mouth daily.  . ONE TOUCH LANCETS MISC 1 Device by Other route daily. Use to test blood sugars once daily  . repaglinide (PRANDIN) 2 MG tablet Take 1 tablet (2 mg total) by mouth 2 (two) times daily before a  meal.   No facility-administered encounter medications on file as of 02/16/2020.     Current Diagnosis/Assessment:   Goals Addressed            This Visit's Progress   . Pharmacy Care Plan       CARE PLAN ENTRY  Current Barriers:  . Chronic Disease Management support, education, and care coordination [name] related to Hypertension, Hyperlipidemia, and Diabetes   Hypertension BP Readings from Last 3 Encounters:  10/26/19 130/72  10/10/19 124/70  04/27/19 128/80 .  Pharmacist Clinical Goal(s): o Over the next 180 days, patient will work with PharmD and providers to maintain BP goal <130/80 . Current regimen:  o Losartan 100 mg daily o HCTZ 25 mg daily . Interventions: o Discussed benefits of monitoring BP at home . Patient self care activities - Over the next 180 days, patient will: o Check BP 1-2 times weekly, document, and provide at future appointments o Ensure daily salt intake < 2300 mg/day  Hyperlipidemia Lab Results  Component Value Date/Time   LDLCALC 78 10/26/2019 09:03 AM   LDLCALC 217 (H) 03/15/2014 08:10 AM   LDLDIRECT 108.0 04/27/2019 10:40 AM .  Pharmacist Clinical Goal(s): o Over the next 180 days, patient will work with PharmD and providers to achieve LDL goal < 100 . Current regimen:  o Atorvastatin 10 mg daily . Interventions: o Discussed cholesterol goals and benefits of statin regarding cardiovascular risk reduction o Discussed effects of statin on liver function; as long as liver function remains normal, the risk of liver damage is very low . Patient self care activities - Over the next 180 days, patient will: o Continue atorvastatin as prescribed o Continue low cholesterol diet  Diabetes Lab Results  Component Value Date/Time   HGBA1C 6.7 (A) 10/10/2019 09:42 AM   HGBA1C 7.2 (H) 04/27/2019 10:40 AM   HGBA1C 6.8 (H) 11/25/2018 08:49 AM   HGBA1C 6.7 04/21/2018 08:04 AM .  Pharmacist Clinical Goal(s): o Over the next 180 days, patient will  work with PharmD and providers to achieve A1c goal <7% . Current regimen:  o Trulicity 4.5 mg once weekly o Repaglinide 2 mg twice a day before breakfast and dinner . Interventions: o Discussed importance of maintaining A1c at goal to prevent diabetic complications o Discussed blood sugar will likely decrease with exercise and weight loss; advised to carry snacks on walks in case of low blodod sugar . Patient self care activities - Over the next 180 days, patient will: o Check blood sugar twice daily, document, and provide at future appointments o Contact provider with any episodes of hypoglycemia o Carry snacks on walks  Medication management . Pharmacist Clinical Goal(s): o Over the next 180 days, patient will work with PharmD and providers to achieve optimal medication adherence . [facility] . Interventions o Comprehensive medication review performed. o Continue current medication management strategy . Patient self care activities - Over the next 180 days, patient will: o Focus on medication adherence by pill box o Take medications as prescribed o Report any  questions or concerns to PharmD and/or provider(s)  Please see past updates related to this goal by clicking on the "Past Updates" button in the selected goal         Hypertension   BP goal < 130/80  Office blood pressures are  BP Readings from Last 3 Encounters:  01/09/20 120/70  12/07/19 122/78  10/26/19 130/72   Kidney Function Lab Results  Component Value Date/Time   CREATININE 0.85 10/26/2019 09:03 AM   CREATININE 0.91 04/27/2019 10:40 AM   GFR 65.92 10/26/2019 09:03 AM   GFRNONAA >60 08/21/2015 04:09 AM   GFRAA >60 08/21/2015 04:09 AM   K 3.3 (L) 10/26/2019 09:03 AM   K 4.4 04/27/2019 10:40 AM   Patient has failed these meds in the past: n/a Patient is currently controlled on the following medications:   Losartan 100 mg daily  HCTZ 25 mg daily  Patient checks BP at home 1-2x per  week  Patient home BP readings are ranging: 130/80s  We discussed diet and exercise extensively; BP goals.  Plan  Continue current medications and control with diet and exercise     Diabetes   A1c goal < 7% Dx: 2015  Recent Relevant Labs: Lab Results  Component Value Date/Time   HGBA1C 7.3 (H) 12/18/2019 10:52 AM   HGBA1C 6.7 (A) 10/10/2019 09:42 AM   HGBA1C 7.2 (H) 04/27/2019 10:40 AM   HGBA1C 6.7 04/21/2018 08:04 AM   GFR 65.92 10/26/2019 09:03 AM   GFR 61.02 04/27/2019 10:40 AM   MICROALBUR 3.5 (H) 02/04/2016 10:23 AM   MICROALBUR 0.8 11/30/2014 03:56 PM    Checking BG: Daily  Recent FBG Readings: 157-174 One low reading: 82  Patient has failed these meds in past: Farxiga, Jardiance (mycotic infections), metformin, nateglinide, Januvia Patient is currently uncontrolled on the following medications:   Trulicity 4.5 mg once weekly (Wednesday)  Repaglinide 2 mg BID before meals (breakfast and dinner)  Last diabetic Eye exam:  Lab Results  Component Value Date/Time   HMDIABEYEEXA No Retinopathy 07/04/2019 12:00 AM    Last diabetic Foot exam: No results found for: HMDIABFOOTEX   We discussed: diet and exercise extensively; BG is still elevated; pt is currently using Trulicity 3 mg every 5 days to use it up quicker (as directed by Dr Loanne Drilling per pt); 2 injections left until 4.5 mg dose will start. Discussed her BG will likely improve once she starts the higher dose.   Plan  Continue current medications and control with diet and exercise    Hyperlipidemia   LDL < 100  Lipid Panel     Component Value Date/Time   CHOL 166 10/26/2019 0903   CHOL 333 (H) 03/15/2014 0810   TRIG 199.0 (H) 10/26/2019 0903   TRIG 263 (H) 03/15/2014 0810   HDL 48.90 10/26/2019 0903   HDL 63 03/15/2014 0810   CHOLHDL 3 10/26/2019 0903   VLDL 39.8 10/26/2019 0903   LDLCALC 78 10/26/2019 0903   LDLCALC 217 (H) 03/15/2014 0810   LDLDIRECT 108.0 04/27/2019 1040    The 10-year  ASCVD risk score Mikey Bussing DC Jr., et al., 2013) is: 22%   Values used to calculate the score:     Age: 72 years     Sex: Female     Is Non-Hispanic African American: No     Diabetic: Yes     Tobacco smoker: No     Systolic Blood Pressure: 161 mmHg     Is BP treated: Yes  HDL Cholesterol: 48.9 mg/dL     Total Cholesterol: 166 mg/dL   Patient has failed these meds in past: n/a Patient is currently uncontrolled on the following medications:   Atorvastatin 10 mg daily   We discussed:  diet and exercise extensively; cholesterol goals and benefit of statin for ASCVD risk reduction.  Plan  Continue current medications and control with diet and exercise  Neuropathy   Patient has failed these meds in past: n/a Patient is currently controlled on the following medications:   Gabapentin 600 mg - 2 tablets HS  Gabapentin 100 mg - up to 3 capsules BID  We discussed:  Does not take 100 mg every day, usually just takes 1 if she [name] it.   Plan  Continue current medications  Joint pain   Patient has failed these meds in past: n/a Patient is currently uncontrolled on the following medications:   Ibuprofen 200 - 600 mg as needed  Biofreeze  Voltaren gel  We discussed:  Pt tried extra strength Tylenol, did not help. Ibuprofen helps a little. Voltaren helps a lot when she uses it. Pt is interested in following up with sports medicine.  Plan  Continue current medications   Iron deficiency   CBC Latest Ref Rng & Units 10/26/2019 04/27/2019 11/25/2018  WBC 4.0 - 10.5 K/uL 5.8 7.8 10.0  Hemoglobin 12.0 - 15.0 g/dL 12.3 11.8(L) 11.8(L)  Hematocrit 36 - 46 % 36.3 36.3 36.2  Platelets 150 - 400 K/uL 307.0 381.0 364.0   Iron/TIBC/Ferritin/ %Sat    Component Value Date/Time   IRON 53 10/09/2015 0945   FERRITIN 8 (L) 12/07/2019 1324   IRONPCTSAT 9.1 (L) 05/06/2015 1037   Patient has failed these meds in past: n/a Patient is currently controlled on the following medications:   . Ferrous sulfate 325 mg daily  We discussed:  Ferritin is low but pt is not anemic; pt could not tolerate BID iron due to constipation; she is taking once daily and tolerating  Plan  Continue current medications  Vaccines   Reviewed and discussed patient's vaccination history.   [facility]  Immunization History  Administered Date(s) Administered  . Hepatitis A 11/14/2003  . Influenza Whole 01/17/2012  . Influenza, High Dose Seasonal PF 02/20/2014, 01/15/2018, 12/26/2018, 01/15/2020  . Influenza-Unspecified 02/24/2013, 02/19/2015, 01/24/2016, 01/07/2017  . PFIZER SARS-COV-2 Vaccination 06/06/2019, 06/24/2019  . Pneumococcal Conjugate-13 03/07/2014  . Pneumococcal Polysaccharide-23 08/02/2015  . Td 06/11/2006  . Tdap 02/04/2016  . Zoster 03/21/2014  . Zoster Recombinat (Shingrix) 09/08/2016, 03/08/2017    Plan  Up to date on vaccinataions   Medication Management   [facility] Uses pill box? Yes Pt endorses 90% compliance  We discussed: [facility] with [facility]; pt was initially interested in switching to [facility] however her endocrinologist would rather she continue using [facility] while he is adjusting her medications. Pt is currently satisfied with pharmacy services.   Plan  Continue current medication management strategy    Follow up: 6 month phone visit  Charlene Brooke, PharmD, [facility] Clinical Pharmacist [facility] [place] [facility] [phone]

## 2020-03-08 ENCOUNTER — Telehealth: Payer: Self-pay | Admitting: Endocrinology

## 2020-03-08 DIAGNOSIS — E1142 Type 2 diabetes mellitus with diabetic polyneuropathy: Secondary | ICD-10-CM

## 2020-03-08 NOTE — Telephone Encounter (Signed)
I put in labs, thanks.

## 2020-03-08 NOTE — Telephone Encounter (Signed)
Are you aware of any other labs you would like to add to this when I order the A1C?  Thanks!

## 2020-03-08 NOTE — Telephone Encounter (Signed)
Patient called and requested that an order be put in for an A1C so that she can go to the [place] walk in lab before her Virtual Visit with provider

## 2020-03-11 NOTE — Telephone Encounter (Signed)
Patient aware order is available for visit.

## 2020-03-13 ENCOUNTER — Other Ambulatory Visit (INDEPENDENT_AMBULATORY_CARE_PROVIDER_SITE_OTHER): Payer: Medicare Other

## 2020-03-13 DIAGNOSIS — E1142 Type 2 diabetes mellitus with diabetic polyneuropathy: Secondary | ICD-10-CM

## 2020-03-13 LAB — HEMOGLOBIN A1C: Hgb A1c MFr Bld: 7.1 % — ABNORMAL HIGH (ref 4.6–6.5)

## 2020-03-13 LAB — TSH: TSH: 1.14 u[IU]/mL (ref 0.35–4.50)

## 2020-03-15 ENCOUNTER — Telehealth (INDEPENDENT_AMBULATORY_CARE_PROVIDER_SITE_OTHER): Payer: Medicare Other | Admitting: Endocrinology

## 2020-03-15 ENCOUNTER — Other Ambulatory Visit: Payer: Self-pay

## 2020-03-15 DIAGNOSIS — E1142 Type 2 diabetes mellitus with diabetic polyneuropathy: Secondary | ICD-10-CM | POA: Diagnosis not present

## 2020-03-15 NOTE — Patient Instructions (Addendum)
check your blood sugar once a day.  vary the time of day when you check, between before the 3 meals, and at bedtime.  also check if you have symptoms of your blood sugar being too high or too low.  please keep a record of the readings and bring it to your next appointment here (or you can bring the meter itself).  You can write it on any piece of paper.  please call us sooner if your blood sugar goes below 70, or if you have a lot of readings over 200. Please come back for a follow-up appointment in 3-4 months.

## 2020-03-15 NOTE — Progress Notes (Signed)
Subjective:    Patient ID: Heather [name], female    DOB: [birth year], 69 y.o.   MRN: [id number]  HPI  telehealth visit today via phone x 8 minutes  Alternatives to telehealth are presented to this patient, and the patient agrees to the telehealth visit. Pt is advised of the cost of the visit, and agrees to this, also.   Patient is at home, and I am at the office.   Persons attending the telehealth visit: the patient and I Pt returns for f/u of diabetes mellitus: DM type: 2 Dx'ed: 7829 Complications: PN.   Therapy: Trulicity and repaglinide.   GDM: never DKA: never Severe hypoglycemia: never.  Pancreatitis: never Other: she has never been on insulin; short bowel syndrome precludes metformin; she did not tolerate [place] or Jardiance (vaginitis); edema precludes pioglitazone.   Interval history: pt states she feels well in general.  she says cbg's vary from 82-220.  She seldom has hypoglycemia, and these episodes are mild.   Past Medical History:  Diagnosis Date  . Anemia   . Colon polyps   . Diabetes mellitus without complication ([place])   . Environmental allergies   . GERD (gastroesophageal reflux disease)   . Heart murmur   . History of DVT (deep vein thrombosis) 1970'S  . History of hiatal hernia   . History of transfusion   . Hyperlipidemia   . Hypertension   . Interstitial cystitis    Dr Janice Norrie  . Shortness of breath dyspnea    "due to low hemaglobin"    Past Surgical History:  Procedure Laterality Date  . APPENDECTOMY    . BILATERAL SALPINGOOPHORECTOMY     painful cysts  . COLONOSCOPY  2005   negative; Dr Earlean Shawl. F/U declined  . CYSTOSCOPY     X 4; Dr Janice Norrie  . LAPAROSCOPIC RIGHT COLECTOMY Right 08/19/2015   Procedure: LAPAROSCOPIC ASSISTED  RIGHT COLECTOMY;  Surgeon: Alphonsa Overall, MD;  Location: [facility];  Service: General;  Laterality: Right;  . PREMAGLINANT POLYPS    . TOTAL ABDOMINAL HYSTERECTOMY     metromenorrhagia    Social History   Socioeconomic  History  . Marital status: Widowed    Spouse name: Not on file  . Number of children: 2  . Years of education: Not on file  . Highest education level: Not on file  Occupational History  . Not on file  Tobacco Use  . Smoking status: Former Smoker    Quit date: 05/18/1984    Years since quitting: 35.8  . Smokeless tobacco: Never Used  . Tobacco comment: smoked 1972-1986, up to 1 ppd  Vaping Use  . Vaping Use: Never used  Substance and Sexual Activity  . Alcohol use: No    Alcohol/week: 0.0 standard drinks  . Drug use: No  . Sexual activity: Never  Other Topics Concern  . Not on file  Social History Narrative   Lives with brother, daughter and 2 grandchildren in a 3 story home.  Has 2 children.  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     Highest level of education:  2 years of graduate school   Social Determinants of Health   Financial Resource Strain: Low Risk   . Difficulty of Paying Living Expenses: Not hard at all  Food Insecurity:   . Worried About Charity fundraiser in the Last Year: Not on file  . Ran Out of Food in the  Last Year: Not on file  Transportation Needs:   . Lack of Transportation (Medical): Not on file  . Lack of Transportation (Non-Medical): Not on file  Physical Activity:   . Days of Exercise per Week: Not on file  . Minutes of Exercise per Session: Not on file  Stress:   . Feeling of Stress : Not on file  Social Connections:   . Frequency of Communication with Friends and Family: Not on file  . Frequency of Social Gatherings with Friends and Family: Not on file  . Attends Religious Services: Not on file  . Active Member of Clubs or Organizations: Not on file  . Attends Archivist Meetings: Not on file  . Marital Status: Not on file  Intimate Partner Violence:   . Fear of Current or Ex-Partner: Not on file  . Emotionally Abused: Not on file  . Physically Abused: Not on file  . Sexually  Abused: Not on file    Current Outpatient Medications on File Prior to Visit  Medication Sig Dispense Refill  . atorvastatin (LIPITOR) 10 MG tablet Take 1 tablet (10 mg total) by mouth daily. 90 tablet 1  . Blood Glucose Monitoring Suppl (ONE TOUCH ULTRA 2) w/Device KIT 1 each by Does not apply route 4 (four) times daily. Use device to monitor glucose levels 4 times per day; E11.9 (Patient taking differently: 1 each by Does not apply route daily. Use device to monitor glucose levels 4 times per day; E11.9) 1 each 0  . Cyanocobalamin 2500 MCG TABS Take 2,500 mcg by mouth daily.     . diclofenac Sodium (VOLTAREN) 1 % GEL Apply 4 g topically 4 (four) times daily. To affected joint. 400 g 11  . Dulaglutide (TRULICITY) 4.5 ZM/6.2HU SOPN Inject 4.5 mg as directed once a week. 12 pen 3  . Famotidine (PEPCID PO) 1 tablet as needed.     . gabapentin (NEURONTIN) 100 MG capsule START WITH 1 CAPSULE IN MORNING AND AFTERNOON, THEN INCREASE TO 3 CAPSULES TWICE A DAY IF TOLERATED. Dx E08.42 180 capsule 1  . gabapentin (NEURONTIN) 600 MG tablet Take 2 tablets (1,200 mg total) by mouth at bedtime. Dx E08.42 180 tablet 1  . glucose blood (ONETOUCH ULTRA) test strip 1 each by Other route daily. And lancets 1/day 100 each 3  . hydrochlorothiazide (HYDRODIURIL) 25 MG tablet Take 1 tablet (25 mg total) by mouth daily. 90 tablet 1  . losartan (COZAAR) 100 MG tablet Take 1 tablet (100 mg total) by mouth daily. 90 tablet 1  . multivitamin-lutein (OCUVITE-LUTEIN) CAPS capsule Take 1 capsule by mouth daily.    . ONE TOUCH LANCETS MISC 1 Device by Other route daily. Use to test blood sugars once daily 90 each 3  . repaglinide (PRANDIN) 2 MG tablet Take 1 tablet (2 mg total) by mouth 2 (two) times daily before a meal. 180 tablet 3   No current facility-administered medications on file prior to visit.    Allergies  Allergen Reactions  . Farxiga [Dapagliflozin]     Constant Vaginal Infections  . Flagyl [Metronidazole]  Other (See Comments)    Made really sick. Was able to tolerate dose 4/17 with zofran  . Jardiance [Empagliflozin]     Constant Vaginal infections  . Lisinopril     04/03/15 cough reported X several months  . Cholestyramine Other (See Comments)    Joint pain  . Sulfonamide Derivatives     nausea    Family History  Problem Relation  Age of Onset  . Heart attack Mother        in 58s  . Hypertension Mother   . Stroke Mother         in 3s  . Other Father        Deceased, 62  . Stroke Maternal Grandfather 50  . Heart attack Paternal Grandfather        in 63s  . Breast cancer Sister   . Healthy Brother   . Healthy Son   . Healthy Daughter   . Diabetes Neg Hx     There were no vitals taken for this visit.   Review of Systems She has lost 12 lbs since last in person visit here (5/21).      Objective:   Physical Exam    Lab Results  Component Value Date   HGBA1C 7.1 (H) 03/13/2020   Lab Results  Component Value Date   CREATININE 0.85 10/26/2019   BUN 12 10/26/2019   NA 136 10/26/2019   K 3.3 (L) 10/26/2019   CL 98 10/26/2019   CO2 30 10/26/2019   Lab Results  Component Value Date   TSH 1.14 03/13/2020       Assessment & Plan:  Type 2 DM, with PN Hypoglycemia, due to insulin: this limits aggressiveness of glycemic control  Patient Instructions  check your blood sugar once a day.  vary the time of day when you check, between before the 3 meals, and at bedtime.  also check if you have symptoms of your blood sugar being too high or too low.  please keep a record of the readings and bring it to your next appointment here (or you can bring the meter itself).  You can write it on any piece of paper.  please call us sooner if your blood sugar goes below 70, or if you have a lot of readings over 200. Please come back for a follow-up appointment in 3-4 months.

## 2020-03-25 ENCOUNTER — Telehealth: Payer: Self-pay | Admitting: Endocrinology

## 2020-03-25 NOTE — Telephone Encounter (Signed)
Patient requests to be called at ph# [phone] re: Patient is having problems with medication and would like to discuss changing the time she takes  repaglinide (PRANDIN) 2 MG tablet

## 2020-03-26 NOTE — Telephone Encounter (Signed)
Called stated ---lose weigh 12 lbs, shaky, sweating, weak legs, headache after taking Rx repaglinide 2 mg. Please advise

## 2020-03-26 NOTE — Telephone Encounter (Signed)
Notified pt D/c Rx repaglinide by dr. Loanne Drilling. Pt have have an appt 04/25/20 @ 9:45 am

## 2020-03-26 NOTE — Telephone Encounter (Signed)
D/c repaglinide F/u 1 month

## 2020-04-02 ENCOUNTER — Other Ambulatory Visit: Payer: Self-pay | Admitting: Endocrinology

## 2020-04-02 DIAGNOSIS — E1142 Type 2 diabetes mellitus with diabetic polyneuropathy: Secondary | ICD-10-CM

## 2020-04-25 ENCOUNTER — Encounter: Payer: Self-pay | Admitting: Endocrinology

## 2020-04-25 ENCOUNTER — Ambulatory Visit: Payer: Medicare Other | Admitting: Endocrinology

## 2020-04-25 ENCOUNTER — Other Ambulatory Visit: Payer: Self-pay

## 2020-04-25 VITALS — HR 76 | Ht 60.0 in | Wt 189.0 lb

## 2020-04-25 DIAGNOSIS — E559 Vitamin D deficiency, unspecified: Secondary | ICD-10-CM | POA: Insufficient documentation

## 2020-04-25 DIAGNOSIS — E1142 Type 2 diabetes mellitus with diabetic polyneuropathy: Secondary | ICD-10-CM

## 2020-04-25 LAB — POCT GLYCOSYLATED HEMOGLOBIN (HGB A1C): Hemoglobin A1C: 6.8 % — AB (ref 4.0–5.6)

## 2020-04-25 NOTE — Progress Notes (Signed)
Subjective:    Patient ID: Heather [name], female    DOB: 05-Nov-1950, 69 y.o.   MRN: [id number]  HPI Pt returns for f/u of diabetes mellitus: DM type: 2 Dx'ed: 6256 Complications: PN.   Therapy: Trulicity and repaglinide.   GDM: never DKA: never Severe hypoglycemia: never.  Pancreatitis: never Other: she has never been on insulin; short bowel syndrome precludes metformin; she did not tolerate [place] or Jardiance (vaginitis), or pioglitazone (hypoglycemia); edema precludes pioglitazone.   Interval history: pt states she feels well in general.  she says cbg's vary from 154-206.  Main symptom is fatigue Past Medical History:  Diagnosis Date  . Anemia   . Colon polyps   . Diabetes mellitus without complication ([place])   . Environmental allergies   . GERD (gastroesophageal reflux disease)   . Heart murmur   . History of DVT (deep vein thrombosis) 1970'S  . History of hiatal hernia   . History of transfusion   . Hyperlipidemia   . Hypertension   . Interstitial cystitis    Dr Janice Norrie  . Shortness of breath dyspnea    "due to low hemaglobin"    Past Surgical History:  Procedure Laterality Date  . APPENDECTOMY    . BILATERAL SALPINGOOPHORECTOMY     painful cysts  . CATARACT EXTRACTION, BILATERAL    . COLONOSCOPY  2005   negative; Dr Earlean Shawl. F/U declined  . CYSTOSCOPY     X 4; Dr Janice Norrie  . LAPAROSCOPIC RIGHT COLECTOMY Right 08/19/2015   Procedure: LAPAROSCOPIC ASSISTED  RIGHT COLECTOMY;  Surgeon: Alphonsa Overall, MD;  Location: [facility];  Service: General;  Laterality: Right;  . PREMAGLINANT POLYPS    . TOTAL ABDOMINAL HYSTERECTOMY     metromenorrhagia    Social History   Socioeconomic History  . Marital status: Widowed    Spouse name: Not on file  . Number of children: 2  . Years of education: Not on file  . Highest education level: Not on file  Occupational History  . Not on file  Tobacco Use  . Smoking status: Former Smoker    Quit date: 05/18/1984    Years since  quitting: 35.9  . Smokeless tobacco: Never Used  . Tobacco comment: smoked 1972-1986, up to 1 ppd  Vaping Use  . Vaping Use: Never used  Substance and Sexual Activity  . Alcohol use: No    Alcohol/week: 0.0 standard drinks  . Drug use: No  . Sexual activity: Never  Other Topics Concern  . Not on file  Social History Narrative   Lives with brother, daughter and 2 grandchildren in a 3 story home.  Has 2 children.  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     Highest level of education:  2 years of graduate school   Social Determinants of Health   Financial Resource Strain: Low Risk   . Difficulty of Paying Living Expenses: Not hard at all  Food Insecurity: Not on file  Transportation Needs: Not on file  Physical Activity: Not on file  Stress: Not on file  Social Connections: Not on file  Intimate Partner Violence: Not on file    Current Outpatient Medications on File Prior to Visit  Medication Sig Dispense Refill  . atorvastatin (LIPITOR) 10 MG tablet Take 1 tablet (10 mg total) by mouth daily. 90 tablet 1  . Blood Glucose Monitoring Suppl (ONE TOUCH ULTRA 2) w/Device KIT 1 each by Does  not apply route 4 (four) times daily. Use device to monitor glucose levels 4 times per day; E11.9 (Patient taking differently: 1 each by Does not apply route daily. Use device to monitor glucose levels 4 times per day; E11.9) 1 each 0  . Cyanocobalamin 2500 MCG TABS Take 2,500 mcg by mouth daily.     . diclofenac Sodium (VOLTAREN) 1 % GEL Apply 4 g topically 4 (four) times daily. To affected joint. 400 g 11  . Dulaglutide (TRULICITY) 4.5 GP/4.9IY SOPN Inject 4.5 mg as directed once a week. 12 pen 3  . Famotidine (PEPCID PO) 1 tablet as needed.     . gabapentin (NEURONTIN) 600 MG tablet Take 2 tablets (1,200 mg total) by mouth at bedtime. Dx E08.42 180 tablet 1  . hydrochlorothiazide (HYDRODIURIL) 25 MG tablet Take 1 tablet (25 mg total) by mouth  daily. 90 tablet 1  . losartan (COZAAR) 100 MG tablet Take 1 tablet (100 mg total) by mouth daily. 90 tablet 1  . multivitamin-lutein (OCUVITE-LUTEIN) CAPS capsule Take 1 capsule by mouth daily.    . ONE TOUCH LANCETS MISC 1 Device by Other route daily. Use to test blood sugars once daily 90 each 3  . ONETOUCH ULTRA test strip TEST ONCE DAILY 100 strip 3   No current facility-administered medications on file prior to visit.    Allergies  Allergen Reactions  . Farxiga [Dapagliflozin]     Constant Vaginal Infections  . Flagyl [Metronidazole] Other (See Comments)    Made really sick. Was able to tolerate dose 4/17 with zofran  . Jardiance [Empagliflozin]     Constant Vaginal infections  . Lisinopril     04/03/15 cough reported X several months  . Cholestyramine Other (See Comments)    Joint pain  . Sulfonamide Derivatives     nausea    Family History  Problem Relation Age of Onset  . Heart attack Mother        in 28s  . Hypertension Mother   . Stroke Mother         in 28s  . Other Father        Deceased, 20  . Stroke Maternal Grandfather 50  . Heart attack Paternal Grandfather        in 2s  . Breast cancer Sister   . Healthy Brother   . Healthy Son   . Healthy Daughter   . Diabetes Neg Hx     Pulse 76   Ht 5' (1.524 m)   Wt 189 lb (85.7 kg)   SpO2 96%   BMI 36.91 kg/m    Review of Systems     Objective:   Physical Exam VITAL SIGNS:  See vs page GENERAL: no distress Pulses: dorsalis pedis intact bilat.   MSK: no deformity of the feet CV: trace bilat leg edema.   Skin:  no ulcer on the feet.  normal color and temp on the feet.  Large callus at the right foot bunion area.   Neuro: sensation is intact to touch on the feet, but decreased from normal.   Ext: there is bilateral onychomycosis of the toenails.   Lab Results  Component Value Date   HGBA1C 6.8 (A) 04/25/2020   Lab Results  Component Value Date   CREATININE 0.85 10/26/2019   BUN 12  10/26/2019   NA 136 10/26/2019   K 3.3 (L) 10/26/2019   CL 98 10/26/2019   CO2 30 10/26/2019   Lab Results  Component Value Date  HGBA1C 6.8 (A) 04/25/2020       Assessment & Plan:  Type 2 DM, with PN: Please continue the same 2 diabetes meds Discordance between A1c and reported cbg's.  Check fructosamine.  Patient Instructions  check your blood sugar once a day.  vary the time of day when you check, between before the 3 meals, and at bedtime.  also check if you have symptoms of your blood sugar being too high or too low.  please keep a record of the readings and bring it to your next appointment here (or you can bring the meter itself).  You can write it on any piece of paper.  please call us sooner if your blood sugar goes below 70, or if you have a lot of readings over 200.   A different type of diabetes blood test is requested for you today.  Please have it drawn tomorrow.  We'll let you know about the results.  Please come back for a follow-up appointment in 3 months.

## 2020-04-25 NOTE — Progress Notes (Signed)
Subjective:    Patient ID: Heather [name], female    DOB: 02/19/1951, 69 y.o.   MRN: [id number]   This visit occurred during the SARS-CoV-2 public health emergency.  Safety protocols were in place, including screening questions prior to the visit, additional usage of staff PPE, and extensive cleaning of exam room while observing appropriate contact time as indicated for disinfecting solutions.    HPI She is here for a physical exam.     Medications and allergies reviewed with patient and updated if appropriate.  Patient Active Problem List   Diagnosis Date Noted  . BMI 35.0-35.9,adult 04/26/2020  . GERD (gastroesophageal reflux disease) 04/26/2020  . Vitamin D deficiency 04/25/2020  . Arthralgia 10/26/2019  . Dry cough 11/22/2017  . Nocturnal leg cramps 02/02/2017  . DOE (dyspnea on exertion) 08/04/2016  . Morbid obesity ([place]) 08/04/2016  . Generalized osteoarthritis of hand 02/04/2016  . Diabetic polyneuropathy associated with diabetes mellitus due to underlying condition ([place]) 11/27/2015  . Hiatal hernia, large 08/02/2015  . Colon polyps 08/02/2015  . Iron deficiency 04/21/2015  . Diabetes type 2, controlled ([place]) 05/03/2014  . Hyperlipidemia, mixed 03/18/2014  . Interstitial cystitis 07/08/2012  . Essential hypertension 02/10/2011    Current Outpatient Medications on File Prior to Visit  Medication Sig Dispense Refill  . atorvastatin (LIPITOR) 10 MG tablet Take 1 tablet (10 mg total) by mouth daily. 90 tablet 1  . Blood Glucose Monitoring Suppl (ONE TOUCH ULTRA 2) w/Device KIT 1 each by Does not apply route 4 (four) times daily. Use device to monitor glucose levels 4 times per day; E11.9 (Patient taking differently: 1 each by Does not apply route daily. Use device to monitor glucose levels 4 times per day; E11.9) 1 each 0  . Cyanocobalamin 2500 MCG TABS Take 2,500 mcg by mouth daily.     . diclofenac Sodium (VOLTAREN) 1 % GEL Apply 4 g topically 4 (four) times  daily. To affected joint. 400 g 11  . Dulaglutide (TRULICITY) 4.5 AY/3.0ZS SOPN Inject 4.5 mg as directed once a week. 12 pen 3  . Famotidine (PEPCID PO) 1 tablet as needed.     . gabapentin (NEURONTIN) 600 MG tablet Take 2 tablets (1,200 mg total) by mouth at bedtime. Dx E08.42 180 tablet 1  . hydrochlorothiazide (HYDRODIURIL) 25 MG tablet Take 1 tablet (25 mg total) by mouth daily. 90 tablet 1  . losartan (COZAAR) 100 MG tablet Take 1 tablet (100 mg total) by mouth daily. 90 tablet 1  . multivitamin-lutein (OCUVITE-LUTEIN) CAPS capsule Take 1 capsule by mouth daily.    . ONE TOUCH LANCETS MISC 1 Device by Other route daily. Use to test blood sugars once daily 90 each 3  . ONETOUCH ULTRA test strip TEST ONCE DAILY 100 strip 3   No current facility-administered medications on file prior to visit.    Past Medical History:  Diagnosis Date  . Anemia   . Colon polyps   . Diabetes mellitus without complication ([place])   . Environmental allergies   . GERD (gastroesophageal reflux disease)   . Heart murmur   . History of DVT (deep vein thrombosis) 1970'S  . History of hiatal hernia   . History of transfusion   . Hyperlipidemia   . Hypertension   . Interstitial cystitis    Dr Janice Norrie  . Shortness of breath dyspnea    "due to low hemaglobin"    Past Surgical History:  Procedure Laterality Date  . APPENDECTOMY    .  BILATERAL SALPINGOOPHORECTOMY     painful cysts  . COLONOSCOPY  2005   negative; Dr Earlean Shawl. F/U declined  . CYSTOSCOPY     X 4; Dr Janice Norrie  . LAPAROSCOPIC RIGHT COLECTOMY Right 08/19/2015   Procedure: LAPAROSCOPIC ASSISTED  RIGHT COLECTOMY;  Surgeon: Alphonsa Overall, MD;  Location: [facility];  Service: General;  Laterality: Right;  . PREMAGLINANT POLYPS    . TOTAL ABDOMINAL HYSTERECTOMY     metromenorrhagia    Social History   Socioeconomic History  . Marital status: Widowed    Spouse name: Not on file  . Number of children: 2  . Years of education: Not on file  . Highest  education level: Not on file  Occupational History  . Not on file  Tobacco Use  . Smoking status: Former Smoker    Quit date: 05/18/1984    Years since quitting: 35.9  . Smokeless tobacco: Never Used  . Tobacco comment: smoked 1972-1986, up to 1 ppd  Vaping Use  . Vaping Use: Never used  Substance and Sexual Activity  . Alcohol use: No    Alcohol/week: 0.0 standard drinks  . Drug use: No  . Sexual activity: Never  Other Topics Concern  . Not on file  Social History Narrative   Lives with brother, daughter and 2 grandchildren in a 3 story home.  Has 2 children.  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     Highest level of education:  2 years of graduate school   Social Determinants of Health   Financial Resource Strain: Low Risk   . Difficulty of Paying Living Expenses: Not hard at all  Food Insecurity: Not on file  Transportation Needs: Not on file  Physical Activity: Not on file  Stress: Not on file  Social Connections: Not on file    Family History  Problem Relation Age of Onset  . Heart attack Mother        in 44s  . Hypertension Mother   . Stroke Mother         in 66s  . Other Father        Deceased, 33  . Stroke Maternal Grandfather 50  . Heart attack Paternal Grandfather        in 67s  . Breast cancer Sister   . Healthy Brother   . Healthy Son   . Healthy Daughter   . Diabetes Neg Hx     Review of Systems  Constitutional: Positive for appetite change (dec) and fatigue. Negative for chills and fever.  Eyes: Negative for visual disturbance.  Respiratory: Positive for cough (chronic, dry - no change) and shortness of breath (with exertion - chronic). Negative for wheezing.   Cardiovascular: Negative for chest pain, palpitations and leg swelling.  Gastrointestinal: Negative for abdominal pain, blood in stool, constipation, diarrhea and nausea.       Gerd controlled  Genitourinary: Negative for dysuria and  hematuria.  Musculoskeletal: Positive for arthralgias (OA  - intermittent).  Skin: Negative for color change and rash.  Neurological: Positive for headaches (occ - tension). Negative for dizziness and light-headedness.  Psychiatric/Behavioral: Negative for dysphoric mood. The patient is not nervous/anxious.        Objective:   Vitals:   04/26/20 0751  BP: 122/76  Pulse: 80  Temp: 98.3 F (36.8 C)  SpO2: 99%   Filed Weights   04/26/20 0751  Weight: 184 lb 3.2 oz (83.6 kg)  Body mass index is 35.97 kg/m.  BP Readings from Last 3 Encounters:  04/26/20 122/76  01/09/20 120/70  12/07/19 122/78    Wt Readings from Last 3 Encounters:  04/26/20 184 lb 3.2 oz (83.6 kg)  04/25/20 189 lb (85.7 kg)  01/09/20 199 lb (90.3 kg)     Physical Exam Constitutional: She appears well-developed and well-nourished. No distress.  HENT:  Head: Normocephalic and atraumatic.  Right Ear: External ear normal. Normal ear canal and TM Left Ear: External ear normal.  Normal ear canal and TM Mouth/Throat: Oropharynx is clear and moist.  Eyes: Conjunctivae and EOM are normal.  Neck: Neck supple. No tracheal deviation present. No thyromegaly present.  No carotid bruit  Cardiovascular: Normal rate, regular rhythm and normal heart sounds.   No murmur heard.  No edema. Pulmonary/Chest: Effort normal and breath sounds normal. No respiratory distress. She has no wheezes. She has no rales.  Breast: deferred   Abdominal: Soft. She exhibits no distension. There is no tenderness.  Lymphadenopathy: She has no cervical adenopathy.  Skin: Skin is warm and dry. She is not diaphoretic.  Psychiatric: She has a normal mood and affect. Her behavior is normal.        Assessment & Plan:   Health Maintenance  Topic Date Due  . COVID-19 Vaccine (3 - Booster for Pfizer series) 12/22/2019  . OPHTHALMOLOGY EXAM  07/03/2020  . HEMOGLOBIN A1C  10/24/2020  . MAMMOGRAM  12/08/2020  . FOOT EXAM  12/14/2020  .  DEXA SCAN  02/03/2021  . TETANUS/TDAP  02/03/2026  . COLONOSCOPY  08/17/2027  . INFLUENZA VACCINE  Completed  . Hepatitis C Screening  Completed  . PNA vac Low Risk Adult  Completed     Screening blood work    ordered Immunizations  Had covid booster Mammogram - Up to date Exercise  Active  Weight  Has lost almost 20 lbs since she was here six months ago - she is eating less Substance abuse  none       See Problem List for Assessment and Plan of chronic medical problems.

## 2020-04-25 NOTE — Patient Instructions (Addendum)
check your blood sugar once a day.  vary the time of day when you check, between before the 3 meals, and at bedtime.  also check if you have symptoms of your blood sugar being too high or too low.  please keep a record of the readings and bring it to your next appointment here (or you can bring the meter itself).  You can write it on any piece of paper.  please call us sooner if your blood sugar goes below 70, or if you have a lot of readings over 200.   A different type of diabetes blood test is requested for you today.  Please have it drawn tomorrow.  We'll let you know about the results.  Please come back for a follow-up appointment in 3 months.

## 2020-04-25 NOTE — Patient Instructions (Addendum)
Try the slow release iron.  Try to take the iron 2-3 times a week - take a stool softener with it.     Blood work was ordered.     No immunization administered today.   Medications changes include :   None.     Please followup in 6 months    Health Maintenance, Female Adopting a healthy lifestyle and getting preventive care are important in promoting health and wellness. Ask your health care provider about:  The right schedule for you to have regular tests and exams.  Things you can do on your own to prevent diseases and keep yourself healthy. What should I know about diet, weight, and exercise? Eat a healthy diet   Eat a diet that includes plenty of vegetables, fruits, low-fat dairy products, and lean protein.  Do not eat a lot of foods that are high in solid fats, added sugars, or sodium. Maintain a healthy weight Body mass index (BMI) is used to identify weight problems. It estimates body fat based on height and weight. Your health care provider can help determine your BMI and help you achieve or maintain a healthy weight. Get regular exercise Get regular exercise. This is one of the most important things you can do for your health. Most adults should:  Exercise for at least 150 minutes each week. The exercise should increase your heart rate and make you sweat (moderate-intensity exercise).  Do strengthening exercises at least twice a week. This is in addition to the moderate-intensity exercise.  Spend less time sitting. Even light physical activity can be beneficial. Watch cholesterol and blood lipids Have your blood tested for lipids and cholesterol at 71 years of age, then have this test every 5 years. Have your cholesterol levels checked more often if:  Your lipid or cholesterol levels are high.  You are older than 71 years of age.  You are at high risk for heart disease. What should I know about cancer screening? Depending on your health history and family  history, you may need to have cancer screening at various ages. This may include screening for:  Breast cancer.  Cervical cancer.  Colorectal cancer.  Skin cancer.  Lung cancer. What should I know about heart disease, diabetes, and high blood pressure? Blood pressure and heart disease  High blood pressure causes heart disease and increases the risk of stroke. This is more likely to develop in people who have high blood pressure readings, are of African descent, or are overweight.  Have your blood pressure checked: ? Every 3-5 years if you are 37-38 years of age. ? Every year if you are 23 years old or older. Diabetes Have regular diabetes screenings. This checks your fasting blood sugar level. Have the screening done:  Once every three years after age 70 if you are at a normal weight and have a low risk for diabetes.  More often and at a younger age if you are overweight or have a high risk for diabetes. What should I know about preventing infection? Hepatitis B If you have a higher risk for hepatitis B, you should be screened for this virus. Talk with your health care provider to find out if you are at risk for hepatitis B infection. Hepatitis C Testing is recommended for:  Everyone born from [birth year] through 1967.  Anyone with known risk factors for hepatitis C. Sexually transmitted infections (STIs)  Get screened for STIs, including gonorrhea and chlamydia, if: ? You are sexually active and are  younger than 71 years of age. ? You are older than 71 years of age and your health care provider tells you that you are at risk for this type of infection. ? Your sexual activity has changed since you were last screened, and you are at increased risk for chlamydia or gonorrhea. Ask your health care provider if you are at risk.  Ask your health care provider about whether you are at high risk for HIV. Your health care provider may recommend a prescription medicine to help prevent HIV  infection. If you choose to take medicine to prevent HIV, you should first get tested for HIV. You should then be tested every 3 months for as long as you are taking the medicine. Pregnancy  If you are about to stop having your period (premenopausal) and you may become pregnant, seek counseling before you get pregnant.  Take 400 to 800 micrograms (mcg) of folic acid every day if you become pregnant.  Ask for birth control (contraception) if you want to prevent pregnancy. Osteoporosis and menopause Osteoporosis is a disease in which the bones lose minerals and strength with aging. This can result in bone fractures. If you are 53 years old or older, or if you are at risk for osteoporosis and fractures, ask your health care provider if you should:  Be screened for bone loss.  Take a calcium or vitamin D supplement to lower your risk of fractures.  Be given hormone replacement therapy (HRT) to treat symptoms of menopause. Follow these instructions at home: Lifestyle  Do not use any products that contain nicotine or tobacco, such as cigarettes, e-cigarettes, and chewing tobacco. If you need help quitting, ask your health care provider.  Do not use street drugs.  Do not share needles.  Ask your health care provider for help if you need support or information about quitting drugs. Alcohol use  Do not drink alcohol if: ? Your health care provider tells you not to drink. ? You are pregnant, may be pregnant, or are planning to become pregnant.  If you drink alcohol: ? Limit how much you use to 0-1 drink a day. ? Limit intake if you are breastfeeding.  Be aware of how much alcohol is in your drink. In the [place] one drink equals one 12 oz bottle of beer (355 mL), one 5 oz glass of wine (148 mL), or one 1 oz glass of hard liquor (44 mL). General instructions  Schedule regular health, dental, and eye exams.  Stay current with your vaccines.  Tell your health care provider if: ? You  often feel depressed. ? You have ever been abused or do not feel safe at home. Summary  Adopting a healthy lifestyle and getting preventive care are important in promoting health and wellness.  Follow your health care provider's instructions about healthy diet, exercising, and getting tested or screened for diseases.  Follow your health care provider's instructions on monitoring your cholesterol and blood pressure. This information is not intended to replace advice given to you by your health care provider. Make sure you discuss any questions you have with your health care provider. Document Revised: 04/27/2018 Document Reviewed: 04/27/2018 Elsevier Patient Education  2020 [employer].

## 2020-04-26 ENCOUNTER — Encounter: Payer: Self-pay | Admitting: Internal Medicine

## 2020-04-26 ENCOUNTER — Ambulatory Visit (INDEPENDENT_AMBULATORY_CARE_PROVIDER_SITE_OTHER): Payer: Medicare Other | Admitting: Internal Medicine

## 2020-04-26 VITALS — BP 122/76 | HR 80 | Temp 98.3°F | Ht 60.0 in | Wt 184.2 lb

## 2020-04-26 DIAGNOSIS — E1142 Type 2 diabetes mellitus with diabetic polyneuropathy: Secondary | ICD-10-CM | POA: Diagnosis not present

## 2020-04-26 DIAGNOSIS — E0842 Diabetes mellitus due to underlying condition with diabetic polyneuropathy: Secondary | ICD-10-CM | POA: Diagnosis not present

## 2020-04-26 DIAGNOSIS — E559 Vitamin D deficiency, unspecified: Secondary | ICD-10-CM

## 2020-04-26 DIAGNOSIS — I10 Essential (primary) hypertension: Secondary | ICD-10-CM | POA: Diagnosis not present

## 2020-04-26 DIAGNOSIS — Z0000 Encounter for general adult medical examination without abnormal findings: Secondary | ICD-10-CM | POA: Diagnosis not present

## 2020-04-26 DIAGNOSIS — D649 Anemia, unspecified: Secondary | ICD-10-CM

## 2020-04-26 DIAGNOSIS — K219 Gastro-esophageal reflux disease without esophagitis: Secondary | ICD-10-CM | POA: Insufficient documentation

## 2020-04-26 DIAGNOSIS — E782 Mixed hyperlipidemia: Secondary | ICD-10-CM

## 2020-04-26 DIAGNOSIS — E611 Iron deficiency: Secondary | ICD-10-CM

## 2020-04-26 DIAGNOSIS — Z6835 Body mass index (BMI) 35.0-35.9, adult: Secondary | ICD-10-CM | POA: Insufficient documentation

## 2020-04-26 LAB — CBC WITH DIFFERENTIAL/PLATELET
Basophils Absolute: 0 10*3/uL (ref 0.0–0.1)
Basophils Relative: 0.7 % (ref 0.0–3.0)
Eosinophils Absolute: 0.2 10*3/uL (ref 0.0–0.7)
Eosinophils Relative: 2.5 % (ref 0.0–5.0)
HCT: 37.6 % (ref 36.0–46.0)
Hemoglobin: 12.9 g/dL (ref 12.0–15.0)
Lymphocytes Relative: 25.3 % (ref 12.0–46.0)
Lymphs Abs: 1.6 10*3/uL (ref 0.7–4.0)
MCHC: 34.2 g/dL (ref 30.0–36.0)
MCV: 80 fl (ref 78.0–100.0)
Monocytes Absolute: 0.5 10*3/uL (ref 0.1–1.0)
Monocytes Relative: 7.5 % (ref 3.0–12.0)
Neutro Abs: 4 10*3/uL (ref 1.4–7.7)
Neutrophils Relative %: 64 % (ref 43.0–77.0)
Platelets: 381 10*3/uL (ref 150.0–400.0)
RBC: 4.71 Mil/uL (ref 3.87–5.11)
RDW: 13.6 % (ref 11.5–15.5)
WBC: 6.2 10*3/uL (ref 4.0–10.5)

## 2020-04-26 LAB — VITAMIN D 25 HYDROXY (VIT D DEFICIENCY, FRACTURES): VITD: 42.36 ng/mL (ref 30.00–100.00)

## 2020-04-26 LAB — LIPID PANEL
Cholesterol: 151 mg/dL (ref 0–200)
HDL: 44.2 mg/dL (ref >39.00)
LDL Cholesterol: 70 mg/dL (ref 0–99)
NonHDL: 106.3
Total CHOL/HDL Ratio: 3
Triglycerides: 180 mg/dL — ABNORMAL HIGH (ref 0.0–149.0)
VLDL: 36 mg/dL (ref 0.0–40.0)

## 2020-04-26 LAB — COMPREHENSIVE METABOLIC PANEL
ALT: 18 U/L (ref 0–35)
AST: 17 U/L (ref 0–37)
Albumin: 4.2 g/dL (ref 3.5–5.2)
Alkaline Phosphatase: 57 U/L (ref 39–117)
BUN: 13 mg/dL (ref 6–23)
CO2: 29 mEq/L (ref 19–32)
Calcium: 10.4 mg/dL (ref 8.4–10.5)
Chloride: 100 mEq/L (ref 96–112)
Creatinine, Ser: 0.97 mg/dL (ref 0.40–1.20)
GFR: 58.77 mL/min — ABNORMAL LOW (ref >60.00)
Glucose, Bld: 149 mg/dL — ABNORMAL HIGH (ref 70–99)
Potassium: 3.5 mEq/L (ref 3.5–5.1)
Sodium: 138 mEq/L (ref 135–145)
Total Bilirubin: 1.3 mg/dL — ABNORMAL HIGH (ref 0.2–1.2)
Total Protein: 7.4 g/dL (ref 6.0–8.3)

## 2020-04-26 NOTE — Assessment & Plan Note (Signed)
Chronic  Lab Results  Component Value Date   HGBA1C 6.8 (A) 04/25/2020   Management per Dr Loanne Drilling

## 2020-04-26 NOTE — Assessment & Plan Note (Signed)
Chronic BP well controlled Continue losartan 100 mg daily, hctz 25 mg daily,  cmp

## 2020-04-26 NOTE — Assessment & Plan Note (Signed)
Chronic Controlled, stable Continue gabapentin 1200 mg at [facility]

## 2020-04-26 NOTE — Assessment & Plan Note (Signed)
Chronic Taking vitamin D daily Check vitamin D level  

## 2020-04-26 NOTE — Assessment & Plan Note (Addendum)
Chronic Has started iron but had to stop due to constipation Will retry one pill 2-3 three times a week and take a stool softener with it Mild Cbc, iron panel

## 2020-04-26 NOTE — Assessment & Plan Note (Addendum)
Chronic BMI 34.97 with htn, DM, high cholesterol  Has lost weight - encouraged her to continue efforts Stressed regular exercise, low sugar/carb diet and dec portions

## 2020-04-26 NOTE — Assessment & Plan Note (Signed)
Chronic GERD controlled Continue otc pecpid 2 tabs a day

## 2020-04-26 NOTE — Addendum Note (Signed)
Addended by: Trenda Moots on: 93/26/7124 08:49 AM   Modules accepted: Orders

## 2020-04-26 NOTE — Addendum Note (Signed)
Addended by: Trenda Moots on: 69/48/5462 08:47 AM   Modules accepted: Orders

## 2020-04-26 NOTE — Assessment & Plan Note (Signed)
Chronic Check lipid panel, cmp Continue atorvastatin 10 mg daily Regular exercise and healthy diet encouraged Has lost weight - encouraged her to continue weight loss efforts

## 2020-04-27 LAB — IRON,TIBC AND FERRITIN PANEL
%SAT: 17 % (calc) (ref 16–45)
Ferritin: 32 ng/mL (ref 16–288)
Iron: 68 ug/dL (ref 45–160)
TIBC: 399 mcg/dL (calc) (ref 250–450)

## 2020-05-02 ENCOUNTER — Telehealth: Payer: Self-pay | Admitting: Pharmacist

## 2020-05-02 LAB — FRUCTOSAMINE: Fructosamine: 282 umol/L (ref 205–285)

## 2020-05-02 NOTE — Progress Notes (Signed)
Chronic Care Management Pharmacy Assistant   Name: Heather [name]  MRN: [id number] DOB: 11/16/1950  Reason for Encounter: Diabetic Adherence Call   PCP : Binnie Rail, MD  Allergies:   Allergies  Allergen Reactions  . Farxiga [Dapagliflozin]     Constant Vaginal Infections  . Flagyl [Metronidazole] Other (See Comments)    Made really sick. Was able to tolerate dose 4/17 with zofran  . Jardiance [Empagliflozin]     Constant Vaginal infections  . Lisinopril     04/03/15 cough reported X several months  . Cholestyramine Other (See Comments)    Joint pain  . Sulfonamide Derivatives     nausea    Medications: Outpatient Encounter Medications as of 05/02/2020  Medication Sig  . atorvastatin (LIPITOR) 10 MG tablet Take 1 tablet (10 mg total) by mouth daily.  . Blood Glucose Monitoring Suppl (ONE TOUCH ULTRA 2) w/Device KIT 1 each by Does not apply route 4 (four) times daily. Use device to monitor glucose levels 4 times per day; E11.9 (Patient taking differently: 1 each by Does not apply route daily. Use device to monitor glucose levels 4 times per day; E11.9)  . Cyanocobalamin 2500 MCG TABS Take 2,500 mcg by mouth daily.   . diclofenac Sodium (VOLTAREN) 1 % GEL Apply 4 g topically 4 (four) times daily. To affected joint.  . Dulaglutide (TRULICITY) 4.5 IO/9.6EX SOPN Inject 4.5 mg as directed once a week.  . Famotidine (PEPCID PO) 1 tablet as needed.   . gabapentin (NEURONTIN) 600 MG tablet Take 2 tablets (1,200 mg total) by mouth at bedtime. Dx B28.41  . hydrochlorothiazide (HYDRODIURIL) 25 MG tablet Take 1 tablet (25 mg total) by mouth daily.  Marland Kitchen losartan (COZAAR) 100 MG tablet Take 1 tablet (100 mg total) by mouth daily.  . multivitamin-lutein (OCUVITE-LUTEIN) CAPS capsule Take 1 capsule by mouth daily.  . ONE TOUCH LANCETS MISC 1 Device by Other route daily. Use to test blood sugars once daily  . ONETOUCH ULTRA test strip TEST ONCE DAILY   No facility-administered  encounter medications on file as of 05/02/2020.    Current Diagnosis: Patient Active Problem List   Diagnosis Date Noted  . BMI 35.0-35.9,adult 04/26/2020  . GERD (gastroesophageal reflux disease) 04/26/2020  . Vitamin D deficiency 04/25/2020  . Arthralgia 10/26/2019  . Dry cough 11/22/2017  . Nocturnal leg cramps 02/02/2017  . DOE (dyspnea on exertion) 08/04/2016  . Morbid obesity ([place]) 08/04/2016  . Generalized osteoarthritis of hand 02/04/2016  . Diabetic polyneuropathy associated with diabetes mellitus due to underlying condition ([place]) 11/27/2015  . Hiatal hernia, large 08/02/2015  . Colon polyps 08/02/2015  . Iron deficiency 04/21/2015  . Diabetes type 2, controlled ([place]) 05/03/2014  . Hyperlipidemia, mixed 03/18/2014  . Interstitial cystitis 07/08/2012  . Essential hypertension 02/10/2011    Goals Addressed   None     Follow-Up:  Pharmacist Review   Recent Relevant Labs: Lab Results  Component Value Date/Time   HGBA1C 6.8 (A) 04/25/2020 09:58 AM   HGBA1C 7.1 (H) 03/13/2020 09:20 AM   HGBA1C 7.3 (H) 12/18/2019 10:52 AM   HGBA1C 6.7 04/21/2018 08:04 AM   MICROALBUR 3.5 (H) 02/04/2016 10:23 AM   MICROALBUR 0.8 11/30/2014 03:56 PM    Kidney Function Lab Results  Component Value Date/Time   CREATININE 0.97 04/26/2020 08:47 AM   CREATININE 0.85 10/26/2019 09:03 AM   GFR 58.77 (L) 04/26/2020 08:47 AM   GFRNONAA >60 08/21/2015 04:09 AM   GFRAA >60 08/21/2015  04:09 AM    . Current antihyperglycemic regimen: The patient states that she is taking Trulicity  . What recent interventions/DTPs have been made to improve glycemic control: The patient is to check blood sugar at least once a day and to keep a record  . Have there been any recent hospitalizations or ED visits since last visit with CPP? The patient has not been to the hospital or ED   . Patient denies hypoglycemic symptoms . Patient denies hyperglycemic symptoms . How often are you checking your blood  sugar? The patient states that she checks blood sugar in the morning before breakfast  . What are your blood sugars ranging? 190 but believes she needs a new meter o Fasting:  o Before meals:  o After meals:  o Bedtime:  . During the week, how often does your blood glucose drop below 70? Patient states that yes but believes she needs a new meter  . Are you checking your feet daily/regularly? The patient states that she does have a callus on her big right toe and that she has some numbness in her feet and and feels like wood  Adherence Review: Is the patient currently on a STATIN medication? Yes, Atorvastatin Is the patient currently on ACE/ARB medication? Yes, Losartan Does the patient have >5 day gap between last estimated fill dates? No   Wendy Poet, Clinical Pharmacist Assistant [facility]

## 2020-05-03 ENCOUNTER — Encounter: Payer: Self-pay | Admitting: Endocrinology

## 2020-05-03 DIAGNOSIS — E1142 Type 2 diabetes mellitus with diabetic polyneuropathy: Secondary | ICD-10-CM

## 2020-05-06 MED ORDER — ONETOUCH ULTRA 2 W/DEVICE KIT: 1.0000 | PACK | Freq: Four times a day (QID) | 0 refills | Status: AC

## 2020-05-16 ENCOUNTER — Telehealth: Payer: Self-pay | Admitting: Pharmacist

## 2020-05-16 NOTE — Progress Notes (Signed)
    Chronic Care Management Pharmacy Assistant   Name: Heather [name]  MRN: [id number] DOB: [birth year]  Reason for Encounter: Chart Review   PCP : Binnie Rail, MD  Allergies:   Allergies  Allergen Reactions  . Farxiga [Dapagliflozin]     Constant Vaginal Infections  . Flagyl [Metronidazole] Other (See Comments)    Made really sick. Was able to tolerate dose 4/17 with zofran  . Jardiance [Empagliflozin]     Constant Vaginal infections  . Lisinopril     04/03/15 cough reported X several months  . Cholestyramine Other (See Comments)    Joint pain  . Sulfonamide Derivatives     nausea    Medications: Outpatient Encounter Medications as of 05/16/2020  Medication Sig  . atorvastatin (LIPITOR) 10 MG tablet Take 1 tablet (10 mg total) by mouth daily.  . Blood Glucose Monitoring Suppl (ONE TOUCH ULTRA 2) w/Device KIT 1 each by Does not apply route 4 (four) times daily. Use device to monitor glucose levels 4 times per day; E11.9  . Cyanocobalamin 2500 MCG TABS Take 2,500 mcg by mouth daily.   . diclofenac Sodium (VOLTAREN) 1 % GEL Apply 4 g topically 4 (four) times daily. To affected joint.  . Dulaglutide (TRULICITY) 4.5 WU/8.8BV SOPN Inject 4.5 mg as directed once a week.  . Famotidine (PEPCID PO) 1 tablet as needed.   . gabapentin (NEURONTIN) 600 MG tablet Take 2 tablets (1,200 mg total) by mouth at bedtime. Dx Q94.50  . hydrochlorothiazide (HYDRODIURIL) 25 MG tablet Take 1 tablet (25 mg total) by mouth daily.  Marland Kitchen losartan (COZAAR) 100 MG tablet Take 1 tablet (100 mg total) by mouth daily.  . multivitamin-lutein (OCUVITE-LUTEIN) CAPS capsule Take 1 capsule by mouth daily.  . ONE TOUCH LANCETS MISC 1 Device by Other route daily. Use to test blood sugars once daily  . ONETOUCH ULTRA test strip TEST ONCE DAILY   No facility-administered encounter medications on file as of 05/16/2020.    Current Diagnosis: Patient Active Problem List   Diagnosis Date Noted  . BMI  35.0-35.9,adult 04/26/2020  . GERD (gastroesophageal reflux disease) 04/26/2020  . Vitamin D deficiency 04/25/2020  . Arthralgia 10/26/2019  . Dry cough 11/22/2017  . Nocturnal leg cramps 02/02/2017  . DOE (dyspnea on exertion) 08/04/2016  . Morbid obesity ([place]) 08/04/2016  . Generalized osteoarthritis of hand 02/04/2016  . Diabetic polyneuropathy associated with diabetes mellitus due to underlying condition ([place]) 11/27/2015  . Hiatal hernia, large 08/02/2015  . Colon polyps 08/02/2015  . Iron deficiency 04/21/2015  . Diabetes type 2, controlled ([place]) 05/03/2014  . Hyperlipidemia, mixed 03/18/2014  . Interstitial cystitis 07/08/2012  . Essential hypertension 02/10/2011    Goals Addressed   None     Follow-Up:  Pharmacist Review    Reviewed chart for medication changes and adherence.  No OVs, Consults, or hospital visits since last care coordination call / Pharmacist visit. No medication changes indicated  No gaps in adherence identified. Patient has follow up scheduled with pharmacy team. No further action required.  Rosendo Gros, [facility]  Practice Team Manager/ CPA (Clinical Pharmacist Assistant) [phone]

## 2020-06-13 ENCOUNTER — Other Ambulatory Visit: Payer: Self-pay | Admitting: Internal Medicine

## 2020-06-13 DIAGNOSIS — I10 Essential (primary) hypertension: Secondary | ICD-10-CM

## 2020-06-14 ENCOUNTER — Encounter: Payer: Self-pay | Admitting: Internal Medicine

## 2020-06-14 NOTE — Progress Notes (Signed)
Outside notes received. Information abstracted. Notes sent to scan.  

## 2020-06-26 ENCOUNTER — Ambulatory Visit (INDEPENDENT_AMBULATORY_CARE_PROVIDER_SITE_OTHER): Payer: Medicare Other

## 2020-06-26 DIAGNOSIS — Z0000 Encounter for general adult medical examination without abnormal findings: Secondary | ICD-10-CM

## 2020-06-26 NOTE — Progress Notes (Signed)
I connected with Heather [name] today by telephone and verified that I am speaking with the correct person using two identifiers. Location patient: home Location provider: work Persons participating in the virtual visit: Heather [name] and Lisette Abu, LPN.   I discussed the limitations, risks, security and privacy concerns of performing an evaluation and management service by telephone and the availability of in person appointments. I also discussed with the patient that there may be a patient responsible charge related to this service. The patient expressed understanding and verbally consented to this telephonic visit.    Interactive audio and video telecommunications were attempted between this provider and patient, however failed, due to patient having technical difficulties OR patient did not have access to video capability.  We continued and completed visit with audio only.  Some vital signs may be absent or patient reported.   Time Spent with patient on telephone encounter: 30 minutes  Subjective:   Heather [name] is a 70 y.o. female who presents for Medicare Annual (Subsequent) preventive examination.  Review of Systems    No ROS. Medicare Wellness Visit. Additional risk factors are reflected in social history. Cardiac Risk Factors include: advanced age (>105mn, >>77women);diabetes mellitus;dyslipidemia;hypertension;family history of premature cardiovascular disease     Objective:    Today's Vitals   06/26/20 0906  PainSc: 5    There is no height or weight on file to calculate BMI.  Advanced Directives 06/26/2020 05/16/2018 02/16/2017 11/28/2015 08/19/2015 08/19/2015 08/13/2015  Does Patient Have a Medical Advance Directive? Yes No No Yes Yes Yes Yes  Type of AParamedicof APlainvilleLiving will - - - HCatahoulaLiving will HJamestownLiving will H[place]Living will  Does patient want to make  changes to medical advance directive? No - Patient declined - - - No - Patient declined No - Patient declined -  Copy of HChesneein Chart? - - - - Yes Yes Yes  Would patient like information on creating a medical advance directive? - Yes (Inpatient - patient requests chaplain consult to create a medical advance directive) Yes (ED - Information included in AVS) - - - -    Current Medications (verified) Outpatient Encounter Medications as of 06/26/2020  Medication Sig  . atorvastatin (LIPITOR) 10 MG tablet Take 1 tablet (10 mg total) by mouth daily.  . Blood Glucose Monitoring Suppl (ONE TOUCH ULTRA 2) w/Device KIT 1 each by Does not apply route 4 (four) times daily. Use device to monitor glucose levels 4 times per day; E11.9  . Cyanocobalamin 2500 MCG TABS Take 2,500 mcg by mouth daily.   . diclofenac Sodium (VOLTAREN) 1 % GEL Apply 4 g topically 4 (four) times daily. To affected joint.  . Dulaglutide (TRULICITY) 4.5 MLA/4.5XMSOPN Inject 4.5 mg as directed once a week.  . Famotidine (PEPCID PO) 1 tablet as needed.   . gabapentin (NEURONTIN) 600 MG tablet Take 2 tablets (1,200 mg total) by mouth at bedtime. Dx EI68.03 . hydrochlorothiazide (HYDRODIURIL) 25 MG tablet Take 1 tablet (25 mg total) by mouth daily.  .Marland Kitchenlosartan (COZAAR) 100 MG tablet TAKE 1 TABLET BY MOUTH EVERY DAY  . multivitamin-lutein (OCUVITE-LUTEIN) CAPS capsule Take 1 capsule by mouth daily.  . ONE TOUCH LANCETS MISC 1 Device by Other route daily. Use to test blood sugars once daily  . ONETOUCH ULTRA test strip TEST ONCE DAILY   No facility-administered encounter medications on file as of 06/26/2020.  Allergies (verified) Farxiga [dapagliflozin], Flagyl [metronidazole], Jardiance [empagliflozin], Lisinopril, Cholestyramine, and Sulfonamide derivatives   History: Past Medical History:  Diagnosis Date  . Anemia   . Colon polyps   . Diabetes mellitus without complication ([place])   . Environmental  allergies   . GERD (gastroesophageal reflux disease)   . Heart murmur   . History of DVT (deep vein thrombosis) 1970'S  . History of hiatal hernia   . History of transfusion   . Hyperlipidemia   . Hypertension   . Interstitial cystitis    Dr Janice Norrie  . Shortness of breath dyspnea    "due to low hemaglobin"   Past Surgical History:  Procedure Laterality Date  . APPENDECTOMY    . BILATERAL SALPINGOOPHORECTOMY     painful cysts  . CATARACT EXTRACTION, BILATERAL    . COLONOSCOPY  2005   negative; Dr Earlean Shawl. F/U declined  . CYSTOSCOPY     X 4; Dr Janice Norrie  . LAPAROSCOPIC RIGHT COLECTOMY Right 08/19/2015   Procedure: LAPAROSCOPIC ASSISTED  RIGHT COLECTOMY;  Surgeon: Alphonsa Overall, MD;  Location: [facility];  Service: General;  Laterality: Right;  . PREMAGLINANT POLYPS    . TOTAL ABDOMINAL HYSTERECTOMY     metromenorrhagia   Family History  Problem Relation Age of Onset  . Heart attack Mother        in 71s  . Hypertension Mother   . Stroke Mother         in 71s  . Other Father        Deceased, 2  . Stroke Maternal Grandfather 50  . Heart attack Paternal Grandfather        in 1s  . Breast cancer Sister   . Healthy Brother   . Healthy Son   . Healthy Daughter   . Diabetes Neg Hx    Social History   Socioeconomic History  . Marital status: Widowed    Spouse name: Not on file  . Number of children: 2  . Years of education: Not on file  . Highest education level: Not on file  Occupational History  . Not on file  Tobacco Use  . Smoking status: Former Smoker    Quit date: 05/18/1984    Years since quitting: 36.1  . Smokeless tobacco: Never Used  . Tobacco comment: smoked 1972-1986, up to 1 ppd  Vaping Use  . Vaping Use: Never used  Substance and Sexual Activity  . Alcohol use: No    Alcohol/week: 0.0 standard drinks  . Drug use: No  . Sexual activity: Never  Other Topics Concern  . Not on file  Social History Narrative   Lives with brother, daughter and 2 grandchildren in  a 3 story home.  Has 2 children.  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     Highest level of education:  2 years of graduate school   Social Determinants of Health   Financial Resource Strain: Low Risk   . Difficulty of Paying Living Expenses: Not hard at all  Food Insecurity: No Food Insecurity  . Worried About Charity fundraiser in the Last Year: Never true  . Ran Out of Food in the Last Year: Never true  Transportation Needs: No Transportation Needs  . Lack of Transportation (Medical): No  . Lack of Transportation (Non-Medical): No  Physical Activity: Inactive  . Days of Exercise per Week: 0 days  . Minutes of Exercise per Session: 0  min  Stress: No Stress Concern Present  . Feeling of Stress : Not at all  Social Connections: Not on file    Tobacco Counseling Counseling given: Not Answered Comment: smoked 1972-1986, up to 1 ppd   Clinical Intake:  Pre-visit preparation completed: Yes  Pain : 0-10 Pain Score: 5  Pain Type: Chronic pain Pain Location: Leg Pain Orientation: Left,Right Pain Descriptors / Indicators: Discomfort,Cramping,Sharp Pain Onset: More than a month ago Pain Frequency: Constant Pain Relieving Factors: 1226m Gabapentin Effect of Pain on Daily Activities: Pain produces disability and affects the quality of life.  Pain Relieving Factors: 12083mGabapentin  Nutritional Risks: None Diabetes: Yes CBG done?: No Did pt. 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.   Activities of Daily Living In your present state of health, do you have any difficulty performing the  following activities: 06/26/2020 04/26/2020  Hearing? N N  Vision? N N  Difficulty concentrating or making decisions? N N  Walking or climbing stairs? N N  Dressing or bathing? N N  Doing errands, shopping? N N  Preparing Food and eating ? N -  Using the Toilet? N -  In the past six months, have you accidently leaked urine? N -  Do you have problems with loss of bowel control? N -  Managing your Medications? N -  Managing your Finances? N -  Housekeeping or managing your Housekeeping? N -  Some recent data might be hidden    Patient Care Team: BuBinnie RailMD as PCP - General (Internal Medicine) MeRichmond CampbellMD as Consulting Physician (Gastroenterology) ElRenato ShinMD as Consulting Physician (Endocrinology) FoCharlton HawsRPRiverside Surgery Center Incs Pharmacist (Pharmacist)  Indicate any recent Medical Services you may have received from other than [facility] providers in the past year (date may be approximate).     Assessment:   This is a routine wellness examination for [place] Hearing/Vision screen No exam data present  Dietary issues and exercise activities discussed: Current Exercise Habits: The patient does not participate in regular exercise at present, Exercise limited by: orthopedic condition(s)  Goals    . Patient Stated     Take time just for me and go through old photographs, food at my antiques, go shopping at the book. Start to go to the senior center and increase the amount of socialization I do.    . Patient Stated     [facility]   . Pharmacy Care Plan     CARE PLAN ENTRY  Current Barriers:  . Chronic Disease Management support, education, and care coordination needs related to Hypertension, Hyperlipidemia, and Diabetes   Hypertension BP Readings from Last 3 Encounters:  10/26/19 130/72  10/10/19 124/70  04/27/19 128/80 .  Pharmacist Clinical Goal(s): o Over the next 180 days, patient will work with PharmD and  providers to maintain BP goal <130/80 . Current regimen:  o Losartan 100 mg daily o HCTZ 25 mg daily . Interventions: o Discussed benefits of monitoring BP at home . Patient self care activities - Over the next 180 days, patient will: o Check BP 1-2 times weekly,  document, and provide at future appointments o Ensure daily salt intake < 2300 mg/day  Hyperlipidemia Lab Results  Component Value Date/Time   LDLCALC 78 10/26/2019 09:03 AM   LDLCALC 217 (H) 03/15/2014 08:10 AM   LDLDIRECT 108.0 04/27/2019 10:40 AM .  Pharmacist Clinical Goal(s): o Over the next 180 days, patient will work with PharmD and providers to achieve LDL goal < 100 . Current regimen:  o Atorvastatin 10 mg daily . Interventions: o Discussed cholesterol goals and benefits of statin regarding cardiovascular risk reduction o Discussed effects of statin on liver function; as long as liver function remains normal, the risk of liver damage is very low . Patient self care activities - Over the next 180 days, patient will: o Continue atorvastatin as prescribed o Continue low cholesterol diet  Diabetes Lab Results  Component Value Date/Time   HGBA1C 6.7 (A) 10/10/2019 09:42 AM   HGBA1C 7.2 (H) 04/27/2019 10:40 AM   HGBA1C 6.8 (H) 11/25/2018 08:49 AM   HGBA1C 6.7 04/21/2018 08:04 AM .  Pharmacist Clinical Goal(s): o Over the next 180 days, patient will work with PharmD and providers to achieve A1c goal <7% . Current regimen:  o Trulicity 4.5 mg once weekly o Repaglinide 2 mg twice a day before breakfast and dinner . Interventions: o Discussed importance of maintaining A1c at goal to prevent diabetic complications o Discussed blood sugar will likely decrease with exercise and weight loss; advised to carry snacks on walks in case of low blodod sugar . Patient self care activities - Over the next 180 days, patient will: o Check blood sugar twice daily, document, and provide at future appointments o Contact provider  with any episodes of hypoglycemia o Carry snacks on walks  Medication management . Pharmacist Clinical Goal(s): o Over the next 180 days, patient will work with PharmD and providers to achieve optimal medication adherence . Current pharmacy: [facility] . Interventions o Comprehensive medication review performed. o Continue current medication management strategy . Patient self care activities - Over the next 180 days, patient will: o Focus on medication adherence by pill box o Take medications as prescribed o Report any questions or concerns to PharmD and/or provider(s)  Please see past updates related to this goal by clicking on the "Past Updates" button in the selected goal      . Stay as  healthy as independent as possible     I want to start [place] and do activities at the [facility].       Depression Screen PHQ 2/9 Scores 06/26/2020 04/26/2020 05/25/2018 05/16/2018 02/16/2017 02/02/2017 11/28/2015  PHQ - 2 Score 1 0 0 2 1 0 0  PHQ- 9 Score - - 0 5 1 - -    Fall Risk Fall Risk  06/26/2020 10/26/2019 05/25/2018 05/16/2018 02/16/2017  Falls in the past year? 0 0 0 0 Yes  Number falls in past yr: 0 0 0 - 1  Injury with Fall? 0 0 0 - No  Risk for fall due to : Impaired balance/gait;Orthopedic patient No Fall Risks - - -  Follow up Falls evaluation completed Falls evaluation completed - - -    FALL RISK PREVENTION PERTAINING TO THE HOME:  Any stairs in or around the home? Yes  If so, are there any without handrails? No  Home free of loose throw rugs in walkways, pet beds, electrical cords, etc? Yes  Adequate lighting in your home to reduce risk of falls? Yes   ASSISTIVE DEVICES UTILIZED TO  PREVENT FALLS:  Life alert? No  Use of a cane, walker or w/c? Yes  Grab bars in the bathroom? No  Shower chair or bench in shower? Yes  Elevated toilet seat or a handicapped toilet? No   TIMED UP AND GO:  Was the test performed? No .  Length of time to ambulate 10 feet: 0 sec.   Gait  steady and fast without use of assistive device  Cognitive Function: MMSE - Mini Mental State Exam 02/16/2017  Orientation to time 5  Orientation to Place 5  Registration 3  Attention/ Calculation 5  Recall 2  Language- name 2 objects 2  Language- repeat 1  Language- follow 3 step command 3  Language- read & follow direction 1  Write a sentence 1  Copy design 1  Total score 29        Immunizations Immunization History  Administered Date(s) Administered  . Hepatitis A 11/14/2003  . Influenza Whole 01/17/2012  . Influenza, High Dose Seasonal PF 02/20/2014, 01/15/2018, 12/26/2018, 01/15/2020  . Influenza-Unspecified 02/24/2013, 02/19/2015, 01/24/2016, 01/07/2017  . PFIZER(Purple Top)SARS-COV-2 Vaccination 06/06/2019, 06/24/2019  . Pneumococcal Conjugate-13 03/07/2014  . Pneumococcal Polysaccharide-23 08/02/2015  . Td 06/11/2006  . Tdap 02/04/2016  . Zoster 03/21/2014  . Zoster Recombinat (Shingrix) 09/08/2016, 03/08/2017    TDAP status: Up to date  Flu Vaccine status: Up to date  Pneumococcal vaccine status: Up to date  Covid-19 vaccine status: Completed vaccines  Qualifies for Shingles Vaccine? Yes   Zostavax completed Yes   Shingrix Completed?: Yes  Screening Tests Health Maintenance  Topic Date Due  . COVID-19 Vaccine (3 - Booster for Pfizer series) 12/22/2019  . OPHTHALMOLOGY EXAM  07/03/2020  . HEMOGLOBIN A1C  10/24/2020  . DEXA SCAN  02/03/2021  . FOOT EXAM  04/25/2021  . MAMMOGRAM  12/28/2021  . TETANUS/TDAP  02/03/2026  . COLONOSCOPY (Pts 45-82yr Insurance coverage will need to be confirmed)  08/17/2027  . INFLUENZA VACCINE  Completed  . Hepatitis C Screening  Completed  . PNA vac Low Risk Adult  Completed    Health Maintenance  Health Maintenance Due  Topic Date Due  . COVID-19 Vaccine (3 - Booster for Pfizer series) 12/22/2019    Colorectal cancer screening: Type of screening: Colonoscopy. Completed 08/16/2017. Repeat every 10  years  Mammogram status: Completed 12/29/2019. Repeat every year  Bone Density status: Completed 02/04/2016. Results reflect: Bone density results: NORMAL. Repeat every 5 years.  Lung Cancer Screening: (Low Dose CT Chest recommended if Age 72-80years, 30 pack-year currently smoking OR have quit w/in 15years.) does qualify.   Lung Cancer Screening Referral: no  Additional Screening:  Hepatitis C Screening: does qualify; Completed: yes  Vision Screening: Recommended annual ophthalmology exams for early detection of glaucoma and other disorders of the eye. Is the patient up to date with their annual eye exam?  Yes  Who is the provider or what is the name of the office in which the patient attends annual eye exams? [facility] If pt is not established with a provider, would they like to be referred to a provider to establish care? No .   Dental Screening: Recommended annual dental exams for proper oral hygiene  Community Resource Referral / Chronic Care Management: CRR required this visit?  No   CCM required this visit?  No      Plan:     I have personally reviewed and noted the following in the patient's chart:   . Medical and social  history . Use of alcohol, tobacco or illicit drugs  . Current medications and supplements . Functional ability and status . Nutritional status . Physical activity . Advanced directives . List of other physicians . Hospitalizations, surgeries, and ER visits in previous 12 months . Vitals . Screenings to include cognitive, depression, and falls . Referrals and appointments  In addition, I have reviewed and discussed with patient certain preventive protocols, quality metrics, and best practice recommendations. A written personalized care plan for preventive services as well as general preventive health recommendations were provided to patient.     Sheral Flow, LPN   12/24/2117   Nurse Notes:  Patient is cogitatively  intact. There were no vitals filed for this visit. There is no height or weight on file to calculate BMI.

## 2020-06-26 NOTE — Patient Instructions (Signed)
Heather [name] , Thank you for taking time to come for your Medicare Wellness Visit. I appreciate your ongoing commitment to your health goals. Please review the following plan we discussed and let me know if I can assist you in the future.   Screening recommendations/referrals: Colonoscopy: 08/16/2017; due every 10 years Mammogram: 12/29/2019 Bone Density: 02/04/2016; due every 5 years Recommended yearly ophthalmology/optometry visit for glaucoma screening and checkup Recommended yearly dental visit for hygiene and checkup  Vaccinations: Influenza vaccine: 01/15/2020 Pneumococcal vaccine: 03/07/2014, 08/02/2015 Tdap vaccine: 02/04/2016; due every 10 years Shingles vaccine: 09/08/2016, 03/08/2017   Covid-19: 06/06/2019, 06/24/2019  Advanced directives: Documents on file.  Conditions/risks identified: Yes; Reviewed health maintenance screenings with patient today and relevant education, vaccines, and/or referrals were provided. Please continue to do your personal lifestyle choices by: daily care of teeth and gums, regular physical activity (goal should be 5 days a week for 30 minutes), eat a healthy diet, avoid tobacco and drug use, limiting any alcohol intake, taking a low-dose aspirin (if not allergic or have been advised by your provider otherwise) and taking vitamins and minerals as recommended by your provider. Continue doing brain stimulating activities (puzzles, reading, adult coloring books, staying active) to keep memory sharp. Continue to eat heart healthy diet (full of fruits, vegetables, whole grains, lean protein, water--limit salt, fat, and sugar intake) and increase physical activity as tolerated.  Next appointment: Please schedule your next Medicare Wellness Visit with your Nurse Health Advisor in 1 year by calling [phone].   Preventive Care 72 Years and Older, Female Preventive care refers to lifestyle choices and visits with your health care provider that can promote health and  wellness. What does preventive care include?  A yearly physical exam. This is also called an annual well check.  Dental exams once or twice a year.  Routine eye exams. Ask your health care provider how often you should have your eyes checked.  Personal lifestyle choices, including:  Daily care of your teeth and gums.  Regular physical activity.  Eating a healthy diet.  Avoiding tobacco and drug use.  Limiting alcohol use.  Practicing safe sex.  Taking low-dose aspirin every day.  Taking vitamin and mineral supplements as recommended by your health care provider. What happens during an annual well check? The services and screenings done by your health care provider during your annual well check will depend on your age, overall health, lifestyle risk factors, and family history of disease. Counseling  Your health care provider may ask you questions about your:  Alcohol use.  Tobacco use.  Drug use.  Emotional well-being.  Home and relationship well-being.  Sexual activity.  Eating habits.  History of falls.  Memory and ability to understand (cognition).  Work and work Statistician.  Reproductive health. Screening  You may have the following tests or measurements:  Height, weight, and BMI.  Blood pressure.  Lipid and cholesterol levels. These may be checked every 5 years, or more frequently if you are over 69 years old.  Skin check.  Lung cancer screening. You may have this screening every year starting at age 72 if you have a 30-pack-year history of smoking and currently smoke or have quit within the past 15 years.  Fecal occult blood test (FOBT) of the stool. You may have this test every year starting at age 72.  Flexible sigmoidoscopy or colonoscopy. You may have a sigmoidoscopy every 5 years or a colonoscopy every 10 years starting at age 72.  Hepatitis C blood  test.  Hepatitis B blood test.  Sexually transmitted disease (STD)  testing.  Diabetes screening. This is done by checking your blood sugar (glucose) after you have not eaten for a while (fasting). You may have this done every 1-3 years.  Bone density scan. This is done to screen for osteoporosis. You may have this done starting at age 72.  Mammogram. This may be done every 1-2 years. Talk to your health care provider about how often you should have regular mammograms. Talk with your health care provider about your test results, treatment options, and if necessary, the need for more tests. Vaccines  Your health care provider may recommend certain vaccines, such as:  Influenza vaccine. This is recommended every year.  Tetanus, diphtheria, and acellular pertussis (Tdap, Td) vaccine. You may need a Td booster every 10 years.  Zoster vaccine. You may need this after age 72.  Pneumococcal 13-valent conjugate (PCV13) vaccine. One dose is recommended after age 72.  Pneumococcal polysaccharide (PPSV23) vaccine. One dose is recommended after age 72. Talk to your health care provider about which screenings and vaccines you need and how often you need them. This information is not intended to replace advice given to you by your health care provider. Make sure you discuss any questions you have with your health care provider. Document Released: 05/31/2015 Document Revised: 01/22/2016 Document Reviewed: 03/05/2015 Elsevier Interactive Patient Education  2017 [employer] Prevention in the Home Falls can cause injuries. They can happen to people of all ages. There are many things you can do to make your home safe and to help prevent falls. What can I do on the outside of my home?  Regularly fix the edges of walkways and driveways and fix any cracks.  Remove anything that might make you trip as you walk through a door, such as a raised step or threshold.  Trim any bushes or trees on the path to your home.  Use bright outdoor lighting.  Clear any walking  paths of anything that might make someone trip, such as rocks or tools.  Regularly check to see if handrails are loose or broken. Make sure that both sides of any steps have handrails.  Any raised decks and porches should have guardrails on the edges.  Have any leaves, snow, or ice cleared regularly.  Use sand or salt on walking paths during winter.  Clean up any spills in your garage right away. This includes oil or grease spills. What can I do in the bathroom?  Use night lights.  Install grab bars by the toilet and in the tub and shower. Do not use towel bars as grab bars.  Use non-skid mats or decals in the tub or shower.  If you need to sit down in the shower, use a plastic, non-slip stool.  Keep the floor dry. Clean up any water that spills on the floor as soon as it happens.  Remove soap buildup in the tub or shower regularly.  Attach bath mats securely with double-sided non-slip rug tape.  Do not have throw rugs and other things on the floor that can make you trip. What can I do in the bedroom?  Use night lights.  Make sure that you have a light by your bed that is easy to reach.  Do not use any sheets or blankets that are too big for your bed. They should not hang down onto the floor.  Have a firm chair that has side arms. You can  use this for support while you get dressed.  Do not have throw rugs and other things on the floor that can make you trip. What can I do in the kitchen?  Clean up any spills right away.  Avoid walking on wet floors.  Keep items that you use a lot in easy-to-reach places.  If you need to reach something above you, use a strong step stool that has a grab bar.  Keep electrical cords out of the way.  Do not use floor polish or wax that makes floors slippery. If you must use wax, use non-skid floor wax.  Do not have throw rugs and other things on the floor that can make you trip. What can I do with my stairs?  Do not leave any items  on the stairs.  Make sure that there are handrails on both sides of the stairs and use them. Fix handrails that are broken or loose. Make sure that handrails are as long as the stairways.  Check any carpeting to make sure that it is firmly attached to the stairs. Fix any carpet that is loose or worn.  Avoid having throw rugs at the top or bottom of the stairs. If you do have throw rugs, attach them to the floor with carpet tape.  Make sure that you have a light switch at the top of the stairs and the bottom of the stairs. If you do not have them, ask someone to add them for you. What else can I do to help prevent falls?  Wear shoes that:  Do not have high heels.  Have rubber bottoms.  Are comfortable and fit you well.  Are closed at the toe. Do not wear sandals.  If you use a stepladder:  Make sure that it is fully opened. Do not climb a closed stepladder.  Make sure that both sides of the stepladder are locked into place.  Ask someone to hold it for you, if possible.  Clearly mark and make sure that you can see:  Any grab bars or handrails.  First and last steps.  Where the edge of each step is.  Use tools that help you move around (mobility aids) if they are needed. These include:  Canes.  Walkers.  Scooters.  Crutches.  Turn on the lights when you go into a dark area. Replace any light bulbs as soon as they burn out.  Set up your furniture so you have a clear path. Avoid moving your furniture around.  If any of your floors are uneven, fix them.  If there are any pets around you, be aware of where they are.  Review your medicines with your doctor. Some medicines can make you feel dizzy. This can increase your chance of falling. Ask your doctor what other things that you can do to help prevent falls. This information is not intended to replace advice given to you by your health care provider. Make sure you discuss any questions you have with your health care  provider. Document Released: 02/28/2009 Document Revised: 10/10/2015 Document Reviewed: 06/08/2014 Elsevier Interactive Patient Education  2017 [employer]..

## 2020-06-28 ENCOUNTER — Telehealth: Payer: Self-pay | Admitting: Pharmacist

## 2020-06-28 ENCOUNTER — Ambulatory Visit (INDEPENDENT_AMBULATORY_CARE_PROVIDER_SITE_OTHER): Payer: Medicare Other | Admitting: Pharmacist

## 2020-06-28 DIAGNOSIS — E782 Mixed hyperlipidemia: Secondary | ICD-10-CM | POA: Diagnosis not present

## 2020-06-28 DIAGNOSIS — G4762 Sleep related leg cramps: Secondary | ICD-10-CM

## 2020-06-28 NOTE — Progress Notes (Signed)
Contacted patient in response to muscle cramp issue brought up in phone call with pharmacy assistant.  Patient reports leg cramps have gotten much worse at night where she is laying awake in pain. She denies changes in medication or lifestyle. She reports she drinks plenty of water. She wants to know if she can increase gabapentin at bedtime - she is currently taking 1200 mg.  Discussed increasing gabapentin is unlikely to help with muscle cramps. Leg cramps commonly stem from low potassium or magnesium, recently patient's potassium was low-normal at 3.5. No recent Mg on file. Pt reports she eats bananas "all the time".  Did consider that atorvastatin may be contributing to cramps, however pt has been on this for years without issue. If leg cramps do not improve over the next few weeks, may consider trial off atorvastatin.  Plan: -pt will try Pedialyte and magnesium oxide 400 mg nightly -if no improvement she will make appt with PCP   BMET    Component Value Date/Time   NA 138 04/26/2020 0847   K 3.5 04/26/2020 0847   CL 100 04/26/2020 0847   CO2 29 04/26/2020 0847   GLUCOSE 149 (H) 04/26/2020 0847   BUN 13 04/26/2020 0847   CREATININE 0.97 04/26/2020 0847   CALCIUM 10.4 04/26/2020 0847   GFRNONAA >60 08/21/2015 0409   GFRAA >60 08/21/2015 0409   Lab Results  Component Value Date/Time   MG 1.9 10/09/2015 09:45 AM   MG 1.7 11/30/2014 03:56 PM

## 2020-06-28 NOTE — Chronic Care Management (AMB) (Signed)
Chronic Care Management Pharmacy Assistant   Name: Heather [name]  MRN: [id number] DOB: 03-22-1951  Reason for Encounter: Disease State/ Diabetes Adherence Call  PCP : Binnie Rail, MD  Allergies:   Allergies  Allergen Reactions   Farxiga [Dapagliflozin]     Constant Vaginal Infections   Flagyl [Metronidazole] Other (See Comments)    Made really sick. Was able to tolerate dose 4/17 with zofran   Jardiance [Empagliflozin]     Constant Vaginal infections   Lisinopril     04/03/15 cough reported X several months   Cholestyramine Other (See Comments)    Joint pain   Sulfonamide Derivatives     nausea    Medications: Outpatient Encounter Medications as of 06/28/2020  Medication Sig   atorvastatin (LIPITOR) 10 MG tablet Take 1 tablet (10 mg total) by mouth daily.   Blood Glucose Monitoring Suppl (ONE TOUCH ULTRA 2) w/Device KIT 1 each by Does not apply route 4 (four) times daily. Use device to monitor glucose levels 4 times per day; E11.9   Cyanocobalamin 2500 MCG TABS Take 2,500 mcg by mouth daily.    diclofenac Sodium (VOLTAREN) 1 % GEL Apply 4 g topically 4 (four) times daily. To affected joint.   Dulaglutide (TRULICITY) 4.5 IR/4.8NI SOPN Inject 4.5 mg as directed once a week.   Famotidine (PEPCID PO) 1 tablet as needed.    gabapentin (NEURONTIN) 600 MG tablet Take 2 tablets (1,200 mg total) by mouth at bedtime. Dx O27.03   hydrochlorothiazide (HYDRODIURIL) 25 MG tablet Take 1 tablet (25 mg total) by mouth daily.   losartan (COZAAR) 100 MG tablet TAKE 1 TABLET BY MOUTH EVERY DAY   multivitamin-lutein (OCUVITE-LUTEIN) CAPS capsule Take 1 capsule by mouth daily.   ONE TOUCH LANCETS MISC 1 Device by Other route daily. Use to test blood sugars once daily   ONETOUCH ULTRA test strip TEST ONCE DAILY   No facility-administered encounter medications on file as of 06/28/2020.    Current Diagnosis: Patient Active Problem List   Diagnosis Date Noted   BMI  35.0-35.9,adult 04/26/2020   GERD (gastroesophageal reflux disease) 04/26/2020   Vitamin D deficiency 04/25/2020   Arthralgia 10/26/2019   Dry cough 11/22/2017   Nocturnal leg cramps 02/02/2017   DOE (dyspnea on exertion) 08/04/2016   Morbid obesity ([place]) 08/04/2016   Generalized osteoarthritis of hand 02/04/2016   Diabetic polyneuropathy associated with diabetes mellitus due to underlying condition ([place]) 11/27/2015   Hiatal hernia, large 08/02/2015   Colon polyps 08/02/2015   Iron deficiency 04/21/2015   Diabetes type 2, controlled ([place]) 05/03/2014   Hyperlipidemia, mixed 03/18/2014   Interstitial cystitis 07/08/2012   Essential hypertension 02/10/2011    Recent Relevant Labs: Lab Results  Component Value Date/Time   HGBA1C 6.8 (A) 04/25/2020 09:58 AM   HGBA1C 7.1 (H) 03/13/2020 09:20 AM   HGBA1C 7.3 (H) 12/18/2019 10:52 AM   HGBA1C 6.7 04/21/2018 08:04 AM   MICROALBUR 3.5 (H) 02/04/2016 10:23 AM   MICROALBUR 0.8 11/30/2014 03:56 PM    Kidney Function Lab Results  Component Value Date/Time   CREATININE 0.97 04/26/2020 08:47 AM   CREATININE 0.85 10/26/2019 09:03 AM   GFR 58.77 (L) 04/26/2020 08:47 AM   GFRNONAA >60 08/21/2015 04:09 AM   GFRAA >60 08/21/2015 04:09 AM     Current antihyperglycemic regimen:  o Trulicity 4.5 JK/0.9 mL; inject 4.5 mg as directed once a week   What recent interventions/DTPs have been made to improve glycemic control:  o Recently  discontinued Repaglinide 2 mg twice a day due to hypoglycemia. Patient states currently only taking Trulicity 4.5 mg weekly on Sunday.   Have there been any recent hospitalizations or ED visits since last visit with CPP? No , patient has not had any recent hospitalizations or ED visits.   Patient denies hypoglycemic symptoms, including Pale, Sweaty, Shaky, Hungry, Nervous/irritable and Vision changes    Patient denies hyperglycemic symptoms, including excessive thirst, fatigue, polyuria and  weakness    How often are you checking your blood sugar? once daily    What are your blood sugars ranging?  o Fasting: 184 o Before meals: n/a o After meals: n/a o Bedtime: n/a   During the week, how often does your blood glucose drop below 70? Never    Are you checking your feet daily/regularly? Yes, patient states she does check her feet regularly.   Adherence Review: Is the patient currently on a STATIN medication? Yes Is the patient currently on ACE/ARB medication? Yes Does the patient have >5 day gap between last estimated fill dates? No  Patient states she has been having severe leg cramps at night. Patient states the cramps are so bad she will lay on the floor in pain and massage her legs. Patient states the leg cramps are keeping her up at night. She would like to know if it's okay to increase her Gabapentin at night to help with the leg cramps. Patient is currently 1200 mg qhs of Gabapentin. Patient requesting message to be sent to Dr. Quay Burow.  April D Calhoun, [place] Pharmacist Assistant [phone]   Follow-Up:  Pharmacist Review

## 2020-06-30 ENCOUNTER — Other Ambulatory Visit: Payer: Self-pay | Admitting: Internal Medicine

## 2020-07-16 ENCOUNTER — Other Ambulatory Visit: Payer: Self-pay | Admitting: Endocrinology

## 2020-08-01 ENCOUNTER — Other Ambulatory Visit: Payer: Self-pay

## 2020-08-01 ENCOUNTER — Telehealth (INDEPENDENT_AMBULATORY_CARE_PROVIDER_SITE_OTHER): Payer: Medicare Other | Admitting: Endocrinology

## 2020-08-01 DIAGNOSIS — E1142 Type 2 diabetes mellitus with diabetic polyneuropathy: Secondary | ICD-10-CM | POA: Diagnosis not present

## 2020-08-01 NOTE — Progress Notes (Signed)
Subjective:    Patient ID: Heather [name], female    DOB: [birth year], 70 y.o.   MRN: [id number]  HPI  telehealth visit today via telephone x 7 minutes.  Alternatives to telehealth are presented to this patient, and the patient agrees to the telehealth visit. Pt is advised of the cost of the visit, and agrees to this, also.   Patient is at home, and I am at the office.   Persons attending the telehealth visit: the patient and I Pt returns for f/u of diabetes mellitus: DM type: 2 Dx'ed: 4174 Complications: PN.   Therapy: Trulicity.    GDM: never DKA: never Severe hypoglycemia: never.  Pancreatitis: never Other: she has never been on insulin; short bowel syndrome precludes metformin; she did not tolerate [place] or Jardiance (vaginitis), or pioglitazone (hypoglycemia); edema precludes pioglitazone; fructosamine confirms A1c.   Interval history: pt states she feels well in general.  she says cbg's vary from 100-200.  She has lost weight--intentional.   Past Medical History:  Diagnosis Date  . Anemia   . Colon polyps   . Diabetes mellitus without complication ([place])   . Environmental allergies   . GERD (gastroesophageal reflux disease)   . Heart murmur   . History of DVT (deep vein thrombosis) 1970'S  . History of hiatal hernia   . History of transfusion   . Hyperlipidemia   . Hypertension   . Interstitial cystitis    Dr Janice Norrie  . Shortness of breath dyspnea    "due to low hemaglobin"    Past Surgical History:  Procedure Laterality Date  . APPENDECTOMY    . BILATERAL SALPINGOOPHORECTOMY     painful cysts  . CATARACT EXTRACTION, BILATERAL    . COLONOSCOPY  2005   negative; Dr Earlean Shawl. F/U declined  . CYSTOSCOPY     X 4; Dr Janice Norrie  . LAPAROSCOPIC RIGHT COLECTOMY Right 08/19/2015   Procedure: LAPAROSCOPIC ASSISTED  RIGHT COLECTOMY;  Surgeon: Alphonsa Overall, MD;  Location: [facility];  Service: General;  Laterality: Right;  . PREMAGLINANT POLYPS    . TOTAL ABDOMINAL HYSTERECTOMY      metromenorrhagia    Social History   Socioeconomic History  . Marital status: Widowed    Spouse name: Not on file  . Number of children: 2  . Years of education: Not on file  . Highest education level: Not on file  Occupational History  . Not on file  Tobacco Use  . Smoking status: Former Smoker    Quit date: 05/18/1984    Years since quitting: 36.2  . Smokeless tobacco: Never Used  . Tobacco comment: smoked 1972-1986, up to 1 ppd  Vaping Use  . Vaping Use: Never used  Substance and Sexual Activity  . Alcohol use: No    Alcohol/week: 0.0 standard drinks  . Drug use: No  . Sexual activity: Never  Other Topics Concern  . Not on file  Social History Narrative   Lives with brother, daughter and 2 grandchildren in a 3 story home.  Has 2 children.  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     Highest level of education:  2 years of graduate school   Social Determinants of Health   Financial Resource Strain: Low Risk   . Difficulty of Paying Living Expenses: Not hard at all  Food Insecurity: No Food Insecurity  . Worried About Charity fundraiser in the Last Year: Never true  .  Ran Out of Food in the Last Year: Never true  Transportation Needs: No Transportation Needs  . Lack of Transportation (Medical): No  . Lack of Transportation (Non-Medical): No  Physical Activity: Inactive  . Days of Exercise per Week: 0 days  . Minutes of Exercise per Session: 0 min  Stress: No Stress Concern Present  . Feeling of Stress : Not at all  Social Connections: Not on file  Intimate Partner Violence: Not on file    Current Outpatient Medications on File Prior to Visit  Medication Sig Dispense Refill  . atorvastatin (LIPITOR) 10 MG tablet Take 1 tablet (10 mg total) by mouth daily. 90 tablet 1  . Blood Glucose Monitoring Suppl (ONE TOUCH ULTRA 2) w/Device KIT 1 each by Does not apply route 4 (four) times daily. Use device to monitor  glucose levels 4 times per day; E11.9 1 kit 0  . Cyanocobalamin 2500 MCG TABS Take 2,500 mcg by mouth daily.     . diclofenac Sodium (VOLTAREN) 1 % GEL Apply 4 g topically 4 (four) times daily. To affected joint. 400 g 11  . Dulaglutide (TRULICITY) 4.5 RU/0.4VW SOPN Inject 4.5 mg as directed once a week. 12 pen 3  . Famotidine (PEPCID PO) 1 tablet as needed.     . gabapentin (NEURONTIN) 600 MG tablet TAKE 2 TABLETS BY MOUTH AT BEDTIME 180 tablet 1  . hydrochlorothiazide (HYDRODIURIL) 25 MG tablet Take 1 tablet (25 mg total) by mouth daily. 90 tablet 1  . Lancets (ONETOUCH DELICA PLUS [id number]) MISC Use to test blood sugars once daily 100 each 3  . losartan (COZAAR) 100 MG tablet TAKE 1 TABLET BY MOUTH EVERY DAY 90 tablet 1  . multivitamin-lutein (OCUVITE-LUTEIN) CAPS capsule Take 1 capsule by mouth daily.    [name] test strip TEST ONCE DAILY 100 strip 3   No current facility-administered medications on file prior to visit.    Allergies  Allergen Reactions  . Farxiga [Dapagliflozin]     Constant Vaginal Infections  . Flagyl [Metronidazole] Other (See Comments)    Made really sick. Was able to tolerate dose 4/17 with zofran  . Jardiance [Empagliflozin]     Constant Vaginal infections  . Lisinopril     04/03/15 cough reported X several months  . Cholestyramine Other (See Comments)    Joint pain  . Sulfonamide Derivatives     nausea    Family History  Problem Relation Age of Onset  . Heart attack Mother        in 71s  . Hypertension Mother   . Stroke Mother         in 53s  . Other Father        Deceased, 57  . Stroke Maternal Grandfather 50  . Heart attack Paternal Grandfather        in 85s  . Breast cancer Sister   . Healthy Brother   . Healthy Son   . Healthy Daughter   . Diabetes Neg Hx     There were no vitals taken for this visit.   Review of Systems     Objective:   Physical Exam      Assessment & Plan:  Type 2 DM, with PN  Patient  Instructions  check your blood sugar once a day.  vary the time of day when you check, between before the 3 meals, and at bedtime.  also check if you have symptoms of your blood sugar being too high or too  low.  please keep a record of the readings and bring it to your next appointment here (or you can bring the meter itself).  You can write it on any piece of paper.  please call us sooner if your blood sugar goes below 70, or if you have a lot of readings over 200.   Your diabetes blood test is requested for you today.  Please have it drawn next week.  We'll let you know about the results.   Please come back for a follow-up appointment in 4 months.

## 2020-08-01 NOTE — Patient Instructions (Addendum)
check your blood sugar once a day.  vary the time of day when you check, between before the 3 meals, and at bedtime.  also check if you have symptoms of your blood sugar being too high or too low.  please keep a record of the readings and bring it to your next appointment here (or you can bring the meter itself).  You can write it on any piece of paper.  please call us sooner if your blood sugar goes below 70, or if you have a lot of readings over 200.   Your diabetes blood test is requested for you today.  Please have it drawn next week.  We'll let you know about the results.   Please come back for a follow-up appointment in 4 months.

## 2020-08-05 ENCOUNTER — Other Ambulatory Visit: Payer: Self-pay | Admitting: Endocrinology

## 2020-08-05 ENCOUNTER — Other Ambulatory Visit (INDEPENDENT_AMBULATORY_CARE_PROVIDER_SITE_OTHER): Payer: Medicare Other

## 2020-08-05 DIAGNOSIS — E1142 Type 2 diabetes mellitus with diabetic polyneuropathy: Secondary | ICD-10-CM

## 2020-08-05 LAB — HEMOGLOBIN A1C: Hgb A1c MFr Bld: 7.7 % — ABNORMAL HIGH (ref 4.6–6.5)

## 2020-08-05 MED ORDER — REPAGLINIDE 0.5 MG PO TABS: 0.5000 mg | ORAL_TABLET | Freq: Two times a day (BID) | ORAL | 11 refills | Status: DC

## 2020-08-15 ENCOUNTER — Telehealth: Payer: Self-pay

## 2020-08-15 NOTE — Telephone Encounter (Signed)
Patient called in with issues with her medication and would like the provider to call her back   She thinks the medication adjustment is not working anymore due to her colon. She says that she is having symptoms as if she was pregnant with morning sickness.    Please call her and advise and get more information for her symptoms   She states she didn't take the medication today and is feeling good and wants to know what the provider would like to do    repaglinide (PRANDIN) 0.5 MG tablet

## 2020-08-16 ENCOUNTER — Ambulatory Visit (INDEPENDENT_AMBULATORY_CARE_PROVIDER_SITE_OTHER): Payer: Medicare Other | Admitting: Pharmacist

## 2020-08-16 ENCOUNTER — Other Ambulatory Visit: Payer: Self-pay

## 2020-08-16 DIAGNOSIS — E1142 Type 2 diabetes mellitus with diabetic polyneuropathy: Secondary | ICD-10-CM | POA: Diagnosis not present

## 2020-08-16 DIAGNOSIS — E782 Mixed hyperlipidemia: Secondary | ICD-10-CM | POA: Diagnosis not present

## 2020-08-16 DIAGNOSIS — I10 Essential (primary) hypertension: Secondary | ICD-10-CM

## 2020-08-16 DIAGNOSIS — E0842 Diabetes mellitus due to underlying condition with diabetic polyneuropathy: Secondary | ICD-10-CM | POA: Diagnosis not present

## 2020-08-16 NOTE — Progress Notes (Signed)
Chronic Care Management Pharmacy Note  08/16/2020 Name:  LENIYA BREIT MRN:  [id number] DOB:  22-Feb-1951  Subjective: NATALIA WITTMEYER is an 70 y.o. year old female who is a primary patient of Burns, [name], MD.  The CCM team was consulted for assistance with disease management and care coordination needs.    Engaged with patient by telephone for follow up visit in response to provider referral for pharmacy case management and/or care coordination services.   Consent to Services:  The patient was given the following information about Chronic Care Management services today, agreed to services, and gave verbal consent: 1. CCM service includes personalized support from designated clinical staff supervised by the primary care provider, including individualized plan of care and coordination with other care providers 2. 24/7 contact phone numbers for assistance for urgent and routine care needs. 3. Service will only be billed when office clinical staff spend 20 minutes or more in a month to coordinate care. 4. Only one practitioner may furnish and bill the service in a calendar month. 5.The patient may stop CCM services at any time (effective at the end of the month) by phone call to the office staff. 6. The patient will be responsible for cost sharing (co-pay) of up to 20% of the service fee (after annual deductible is met). Patient agreed to services and consent obtained.  Patient Care Team: Binnie Rail, MD as PCP - General (Internal Medicine) Richmond Campbell, MD as Consulting Physician (Gastroenterology) Renato Shin, MD as Consulting Physician (Endocrinology) Charlton Haws, [facility] as Pharmacist (Pharmacist)  Recent office visits: 04/26/20 Dr Quay Burow OV: chronic f/u. Try slow-release iron 2-3x per week w/ stool softener.  Recent consult visits: 08/01/20 Dr Loanne Drilling (endocrine): A1c increase to 7.7. Added repaglinide 0.5 mg BID w/meals. No metformin d/t short bowel syndrome.  Hospital  visits: None in previous 6 months  Objective:  Lab Results  Component Value Date   CREATININE 0.97 04/26/2020   BUN 13 04/26/2020   GFR 58.77 (L) 04/26/2020   GFRNONAA >60 08/21/2015   GFRAA >60 08/21/2015   NA 138 04/26/2020   K 3.5 04/26/2020   CALCIUM 10.4 04/26/2020   CO2 29 04/26/2020   GLUCOSE 149 (H) 04/26/2020    Lab Results  Component Value Date/Time   HGBA1C 7.7 (H) 08/05/2020 09:23 AM   HGBA1C 6.8 (A) 04/25/2020 09:58 AM   HGBA1C 7.1 (H) 03/13/2020 09:20 AM   HGBA1C 6.7 04/21/2018 08:04 AM   FRUCTOSAMINE 282 04/26/2020 08:49 AM   GFR 58.77 (L) 04/26/2020 08:47 AM   GFR 65.92 10/26/2019 09:03 AM   MICROALBUR 3.5 (H) 02/04/2016 10:23 AM   MICROALBUR 0.8 11/30/2014 03:56 PM    Last diabetic Eye exam:  Lab Results  Component Value Date/Time   HMDIABEYEEXA No Retinopathy 07/04/2019 12:00 AM    Last diabetic Foot exam: No results found for: HMDIABFOOTEX   Lab Results  Component Value Date   CHOL 151 04/26/2020   HDL 44.20 04/26/2020   LDLCALC 70 04/26/2020   LDLDIRECT 108.0 04/27/2019   TRIG 180.0 (H) 04/26/2020   CHOLHDL 3 04/26/2020    Hepatic Function Latest Ref Rng & Units 04/26/2020 10/26/2019 04/27/2019  Total Protein 6.0 - 8.3 g/dL 7.4 7.0 7.5  Albumin 3.5 - 5.2 g/dL 4.2 4.2 4.4  AST 0 - 37 U/L '17 20 22  ' ALT 0 - 35 U/L '18 27 24  ' Alk Phosphatase 39 - 117 U/L 57 59 59  Total Bilirubin 0.2 - 1.2 mg/dL  1.3(H) 1.2 1.0  Bilirubin, Direct 0.0 - 0.3 mg/dL - - -    Lab Results  Component Value Date/Time   TSH 1.14 03/13/2020 09:20 AM   TSH 1.95 11/25/2018 08:49 AM    CBC Latest Ref Rng & Units 04/26/2020 10/26/2019 04/27/2019  WBC 4.0 - 10.5 K/uL 6.2 5.8 7.8  Hemoglobin 12.0 - 15.0 g/dL 12.9 12.3 11.8(L)  Hematocrit 36.0 - 46.0 % 37.6 36.3 36.3  Platelets 150.0 - 400.0 K/uL 381.0 307.0 381.0   Iron/TIBC/Ferritin/ %Sat    Component Value Date/Time   IRON 68 04/26/2020 0847   TIBC 399 04/26/2020 0847   FERRITIN 32 04/26/2020 0847   IRONPCTSAT  17 04/26/2020 0847    Lab Results  Component Value Date/Time   VD25OH 42.36 04/26/2020 08:47 AM   VD25OH 27.97 (L) 11/30/2014 03:56 PM   Clinical ASCVD: No  The 10-year ASCVD risk score Mikey Bussing DC Jr., et al., 2013) is: 22.4%   Values used to calculate the score:     Age: 46 years     Sex: Female     Is Non-Hispanic African American: No     Diabetic: Yes     Tobacco smoker: No     Systolic Blood Pressure: 209 mmHg     Is BP treated: Yes     HDL Cholesterol: 44.2 mg/dL     Total Cholesterol: 151 mg/dL    Depression screen [facility] 2/9 06/26/2020 04/26/2020 05/25/2018  Decreased Interest 0 0 0  Down, Depressed, Hopeless 1 0 0  PHQ - 2 Score 1 0 0  Altered sleeping - - 0  Tired, decreased energy - - 0  Change in appetite - - 0  Feeling bad or failure about yourself  - - 0  Trouble concentrating - - 0  Moving slowly or fidgety/restless - - 0  Suicidal thoughts - - 0  PHQ-9 Score - - 0  Difficult doing work/chores - - -     BP Readings from Last 3 Encounters:  04/26/20 122/76  01/09/20 120/70  12/07/19 122/78   Pulse Readings from Last 3 Encounters:  04/26/20 80  04/25/20 76  01/09/20 78   Wt Readings from Last 3 Encounters:  04/26/20 184 lb 3.2 oz (83.6 kg)  04/25/20 189 lb (85.7 kg)  01/09/20 199 lb (90.3 kg)   BMI Readings from Last 3 Encounters:  04/26/20 35.97 kg/m  04/25/20 36.91 kg/m  01/09/20 38.86 kg/m    Assessment/Interventions: Review of patient past medical history, allergies, medications, health status, including review of consultants reports, laboratory and other test data, was performed as part of comprehensive evaluation and provision of chronic care management services.   SDOH:  (Social Determinants of Health) assessments and interventions performed: Yes  SDOH Screenings   Alcohol Screen: Low Risk   . Last Alcohol Screening Score (AUDIT): 0  Depression (PHQ2-9): Low Risk   . PHQ-2 Score: 1  Financial Resource Strain: Low Risk   . Difficulty of  Paying Living Expenses: Not hard at all  Food Insecurity: No Food Insecurity  . Worried About Charity fundraiser in the Last Year: Never true  . Ran Out of Food in the Last Year: Never true  Housing: Low Risk   . Last Housing Risk Score: 0  Physical Activity: Inactive  . Days of Exercise per Week: 0 days  . Minutes of Exercise per Session: 0 min  Social Connections: Not on file  Stress: No Stress Concern Present  . Feeling of Stress :  Not at all  Tobacco Use: Medium Risk  . Smoking Tobacco Use: Former Smoker  . Smokeless Tobacco Use: Never Used  Transportation Needs: No Transportation Needs  . Lack of Transportation (Medical): No  . Lack of Transportation (Non-Medical): No    CCM Care Plan  Allergies  Allergen Reactions  . Farxiga [Dapagliflozin]     Constant Vaginal Infections  . Flagyl [Metronidazole] Other (See Comments)    Made really sick. Was able to tolerate dose 4/17 with zofran  . Jardiance [Empagliflozin]     Constant Vaginal infections  . Lisinopril     04/03/15 cough reported X several months  . Cholestyramine Other (See Comments)    Joint pain  . Sulfonamide Derivatives     nausea    Medications Reviewed Today    Reviewed by Charlton Haws, [facility] (Pharmacist) on 08/16/20 at [place] List Status: <None>  Medication Order Taking? Sig Documenting Provider Last Dose Status Informant  atorvastatin (LIPITOR) 10 MG tablet [id number] Yes Take 1 tablet (10 mg total) by mouth daily. Binnie Rail, MD Taking Active   Blood Glucose Monitoring Suppl (ONE TOUCH ULTRA 2) w/Device KIT [id number] Yes 1 each by Does not apply route 4 (four) times daily. Use device to monitor glucose levels 4 times per day; E11.9 Renato Shin, MD Taking Active   Cyanocobalamin 2500 MCG TABS [id number] Yes Take 2,500 mcg by mouth daily.  [provider] Taking Active   diclofenac Sodium (VOLTAREN) 1 % GEL [id number] Yes Apply 4 g topically 4 (four) times daily. To affected joint.  Gregor Hams, MD Taking Active   Dulaglutide (TRULICITY) 4.5 JQ/3.0SP Bonney Aid [id number] Yes Inject 4.5 mg as directed once a week. Renato Shin, MD Taking Active   Famotidine (PEPCID PO) [id number] Yes 1 tablet as needed.  [provider] Taking Active   gabapentin (NEURONTIN) 600 MG tablet [id number] Yes TAKE 2 TABLETS BY MOUTH AT BEDTIME Burns, [name], MD Taking Active   hydrochlorothiazide (HYDRODIURIL) 25 MG tablet [id number] Yes Take 1 tablet (25 mg total) by mouth daily. Binnie Rail, MD Taking Active   Lancets (ONETOUCH DELICA PLUS [id number]) [place] [id number] Yes Use to test blood sugars once daily Renato Shin, MD Taking Active   losartan (COZAAR) 100 MG tablet [id number] Yes TAKE 1 TABLET BY MOUTH EVERY DAY Burns, [name], MD Taking Active   multivitamin-lutein [facility]) CAPS capsule [id number] Yes Take 1 capsule by mouth daily. [provider] Taking Active   [facility] ULTRA test strip [id number] Yes TEST ONCE DAILY Renato Shin, MD Taking Active   repaglinide (PRANDIN) 0.5 MG tablet [id number] No Take 1 tablet (0.5 mg total) by mouth 2 (two) times daily before a meal.  Patient not taking: Reported on 08/16/2020   Renato Shin, MD Not Taking Active            Med Note Charlton Haws   Fri Aug 16, 2020  1:39 PM) Side effect - nausea/vomiting          Patient Active Problem List   Diagnosis Date Noted  . BMI 35.0-35.9,adult 04/26/2020  . GERD (gastroesophageal reflux disease) 04/26/2020  . Vitamin D deficiency 04/25/2020  . Arthralgia 10/26/2019  . Dry cough 11/22/2017  . Nocturnal leg cramps 02/02/2017  . DOE (dyspnea on exertion) 08/04/2016  . Morbid obesity ([place]) 08/04/2016  . Generalized osteoarthritis of hand 02/04/2016  . Diabetic polyneuropathy associated with diabetes mellitus due to underlying condition ([place]) 11/27/2015  . Hiatal hernia, large 08/02/2015  .  Colon polyps 08/02/2015  . Iron deficiency 04/21/2015  . Diabetes type 2, controlled  ([place]) 05/03/2014  . Hyperlipidemia, mixed 03/18/2014  . Interstitial cystitis 07/08/2012  . Essential hypertension 02/10/2011    Immunization History  Administered Date(s) Administered  . Hepatitis A 11/14/2003  . Influenza Whole 01/17/2012  . Influenza, High Dose Seasonal PF 02/20/2014, 01/15/2018, 12/26/2018, 01/15/2020  . Influenza-Unspecified 02/24/2013, 02/19/2015, 01/24/2016, 01/07/2017  . PFIZER(Purple Top)SARS-COV-2 Vaccination 06/06/2019, 06/24/2019, 02/13/2020  . Pneumococcal Conjugate-13 03/07/2014  . Pneumococcal Polysaccharide-23 08/02/2015  . Td 06/11/2006  . Tdap 02/04/2016  . Zoster 03/21/2014  . Zoster Recombinat (Shingrix) 09/08/2016, 03/08/2017    Conditions to be addressed/monitored:  Hypertension, Hyperlipidemia and Diabetes, Leg cramps  Care Plan : [place]  Updates made by Charlton Haws, [place] since 08/16/2020 12:00 AM    Problem: Hypertension, Hyperlipidemia and Diabetes, Leg cramps   Priority: High    Long-Range Goal: Disease mangement   Start Date: 08/16/2020  Expected End Date: 02/15/2021  This Visit's Progress: On track  Priority: High  Note:   Current Barriers:  . Unable to independently monitor therapeutic efficacy . Unable to maintain control of diabetes  Pharmacist Clinical Goal(s):  Marland Kitchen Patient will achieve adherence to monitoring guidelines and medication adherence to achieve therapeutic efficacy . maintain control of Diabetes as evidenced by A1c  through collaboration with PharmD and provider.   Interventions: . 1:1 collaboration with Binnie Rail, MD regarding development and update of comprehensive plan of care as evidenced by provider attestation and co-signature . Inter-disciplinary care team collaboration (see longitudinal plan of care) . Comprehensive medication review performed; medication list updated in electronic medical record  Hypertension    BP goal < 130/80 Patient checks BP at home 1-2x per  week Patient home BP readings are ranging: 130/80s  Patient has failed these meds in the past: n/a Patient is currently controlled on the following medications:   Losartan 100 mg daily  HCTZ 25 mg daily   We discussed diet and exercise extensively; BP goals.   Plan: Continue current medications and control with diet and exercise     Diabetes (Dx 2015)    A1c goal < 8% Checking BG: Daily Recent FBG Readings: 170s One low reading: 82   Patient has failed these meds in past: Farxiga, Jardiance (mycotic infections), metformin (short gut), nateglinide, Januvia, actos (edema), repaglinide (vomiting) Patient is currently uncontrolled on the following medications:   Trulicity 4.5 mg once weekly (Wednesday)   We discussed: patient cannot tolerate repaglinide; she restarted the medication and has severe nausea and vomiting; when she stopped the drug her symptoms improved; pt has discussed with endocrine, she was told this was the last oral drug available to be tried, she cannot take other oral drugs due to past intolerances and short gut syndrome. -discussed A1c goal - previously patient was targeting A1c < 7%, given age and inability to tolerate many medications, it is reasonable to raise her goal to <8% -pt reports lifestyle modifications (MORPH program w/ [facility]) have been helping: Wt 181 lbs (down 8 lbs), 11000 steps per day, eating high-protein yogurt, sugar-free jello, trying hard to reduce carbs.   Plan: Continue current medications and control with diet and exercise    Hyperlipidemia    LDL goal < 100  Patient has failed these meds in past: n/a Patient is currently uncontrolled on the following medications:   Atorvastatin 10 mg daily     We discussed:  pt was previously comlaining of  leg cramps; she reports these cramps pre-date atorvastatin; she has been trying to stay hydrated and stretch   Plan: Continue current medications and control with diet and exercise   Neuropathy /  Leg cramps    Patient has failed these meds in past: n/a Patient is currently controlled on the following medications:   Gabapentin 600 mg - 2 tablets HS   We discussed:  pt reports gabapentin helps with leg cramps   Plan: Continue current medications   Patient Goals/Self-Care Activities . Patient will:  - take medications as prescribed focus on medication adherence by pill box check glucose daily, document, and provide at future appointments target a minimum of 150 minutes of moderate intensity exercise weekly engage in dietary modifications by reducing carbs  Follow Up Plan: Telephone follow up appointment with care management team member scheduled for: 3 months      Medication Assistance: None required.  Patient affirms current coverage meets needs. - LIS  Patient's preferred pharmacy is:  [id number] [address] [id number]: [phone]: [phone] [place]Phone: [phone]: [phone] Uses pill box? Yes Pt endorses 100% compliance  We discussed: Current pharmacy is preferred with insurance plan and patient is satisfied with pharmacy services Patient decided to: Continue current medication management strategy  Care Plan and Follow Up Patient Decision:  Patient agrees to Care Plan and Follow-up.  Plan: Telephone follow up appointment with care management team member scheduled for:  3 months  LCharlene Brooke PharmD, [place] CPP Clinical Pharmacist [phone]

## 2020-08-16 NOTE — Telephone Encounter (Signed)
Spoke to pt --stated when taking the repaglinide 0.5 mg bid--having nausea, vomiting, and upset stomach. Last taken this medication was yesterday, and feeling much better. Pt verified still taking the trulicity. Please advise

## 2020-08-16 NOTE — Patient Instructions (Signed)
Visit Information  Phone number for Pharmacist: [phone]  Goals Addressed   None    Patient Care Plan: CCM Pharmacy Care Plan    Problem Identified: Hypertension, Hyperlipidemia and Diabetes, Leg cramps   Priority: High    Long-Range Goal: Disease mangement   Start Date: 08/16/2020  Expected End Date: 02/15/2021  This Visit's Progress: On track  Priority: High  Note:   Current Barriers:  . Unable to independently monitor therapeutic efficacy . Unable to maintain control of diabetes  Pharmacist Clinical Goal(s):  Marland Kitchen Patient will achieve adherence to monitoring guidelines and medication adherence to achieve therapeutic efficacy . maintain control of Diabetes as evidenced by A1c  through collaboration with PharmD and provider.   Interventions: . 1:1 collaboration with Binnie Rail, MD regarding development and update of comprehensive plan of care as evidenced by provider attestation and co-signature . Inter-disciplinary care team collaboration (see longitudinal plan of care) . Comprehensive medication review performed; medication list updated in electronic medical record  Hypertension    BP goal < 130/80 Patient checks BP at home 1-2x per week Patient home BP readings are ranging: 130/80s  Patient has failed these meds in the past: n/a Patient is currently controlled on the following medications:   Losartan 100 mg daily  HCTZ 25 mg daily   We discussed diet and exercise extensively; BP goals.   Plan: Continue current medications and control with diet and exercise     Diabetes (Dx 2015)    A1c goal < 8% Checking BG: Daily Recent FBG Readings: 170s One low reading: 82   Patient has failed these meds in past: Farxiga, Jardiance (mycotic infections), metformin (short gut), nateglinide, Januvia, actos (edema), repaglinide (vomiting) Patient is currently uncontrolled on the following medications:   Trulicity 4.5 mg once weekly (Wednesday)   We discussed: patient  cannot tolerate repaglinide; she restarted the medication and has severe nausea and vomiting; when she stopped the drug her symptoms improved; pt has discussed with endocrine, she was told this was the last oral drug available to be tried, she cannot take other oral drugs due to past intolerances and short gut syndrome. -discussed A1c goal - previously patient was targeting A1c < 7%, given age and inability to tolerate many medications, it is reasonable to raise her goal to <8% -pt reports lifestyle modifications (MORPH program w/ [facility]) have been helping: Wt 181 lbs (down 8 lbs), 11000 steps per day, eating high-protein yogurt, sugar-free jello, trying hard to reduce carbs.   Plan: Continue current medications and control with diet and exercise    Hyperlipidemia    LDL goal < 100  Patient has failed these meds in past: n/a Patient is currently uncontrolled on the following medications:   Atorvastatin 10 mg daily     We discussed:  pt was previously comlaining of leg cramps; she reports these cramps pre-date atorvastatin; she has been trying to stay hydrated and stretch   Plan: Continue current medications and control with diet and exercise   Neuropathy / Leg cramps    Patient has failed these meds in past: n/a Patient is currently controlled on the following medications:   Gabapentin 600 mg - 2 tablets HS   We discussed:  pt reports gabapentin helps with leg cramps   Plan: Continue current medications   Patient Goals/Self-Care Activities . Patient will:  - take medications as prescribed focus on medication adherence by pill box check glucose daily, document, and provide at future appointments target a minimum of  150 minutes of moderate intensity exercise weekly engage in dietary modifications by reducing carbs  Follow Up Plan: Telephone follow up appointment with care management team member scheduled for: 3 months      The patient verbalized understanding of instructions,  educational materials, and care plan provided today and declined offer to receive copy of patient instructions, educational materials, and care plan.  Telephone follow up appointment with pharmacy team member scheduled for: 3 months  Charlene Brooke, PharmD, [place], CPP Clinical Pharmacist [facility] [phone]

## 2020-08-16 NOTE — Telephone Encounter (Signed)
Notified pt instructions by Dr. Loanne Drilling and routed to set an appt for DM f/u with Dr. Loanne Drilling front staff.

## 2020-08-16 NOTE — Telephone Encounter (Signed)
OK, why don't you d/c repaglinide for now?  I'll see you next time.

## 2020-08-19 ENCOUNTER — Other Ambulatory Visit: Payer: Self-pay | Admitting: Internal Medicine

## 2020-08-21 ENCOUNTER — Other Ambulatory Visit: Payer: Self-pay | Admitting: Endocrinology

## 2020-09-19 ENCOUNTER — Telehealth: Payer: Self-pay | Admitting: Pharmacist

## 2020-09-24 ENCOUNTER — Telehealth: Payer: Self-pay | Admitting: Pharmacist

## 2020-09-24 NOTE — Progress Notes (Signed)
Chronic Care Management Pharmacy Assistant   Name: Heather [name]  MRN: [id number] DOB: 1950/09/28   Reason for Encounter: Disease State Diabetes Mellitus     Recent office visits:  None noted  Recent consult visits:  None noted  Hospital visits:  None in previous 6 months  Medications: Outpatient Encounter Medications as of 09/24/2020  Medication Sig Note  . atorvastatin (LIPITOR) 10 MG tablet TAKE 1 TABLET BY MOUTH EVERY DAY   . Blood Glucose Monitoring Suppl (ONE TOUCH ULTRA 2) w/Device KIT 1 each by Does not apply route 4 (four) times daily. Use device to monitor glucose levels 4 times per day; E11.9   . Cyanocobalamin 2500 MCG TABS Take 2,500 mcg by mouth daily.    . diclofenac Sodium (VOLTAREN) 1 % GEL Apply 4 g topically 4 (four) times daily. To affected joint.   . Famotidine (PEPCID PO) 1 tablet as needed.    . gabapentin (NEURONTIN) 600 MG tablet TAKE 2 TABLETS BY MOUTH AT BEDTIME   . hydrochlorothiazide (HYDRODIURIL) 25 MG tablet TAKE 1 TABLET BY MOUTH EVERY DAY   . Lancets (ONETOUCH DELICA PLUS [id number]) MISC Use to test blood sugars once daily   . losartan (COZAAR) 100 MG tablet TAKE 1 TABLET BY MOUTH EVERY DAY   . multivitamin-lutein (OCUVITE-LUTEIN) CAPS capsule Take 1 capsule by mouth daily.   Heather [name] ULTRA test strip TEST ONCE DAILY   . repaglinide (PRANDIN) 0.5 MG tablet Take 1 tablet (0.5 mg total) by mouth 2 (two) times daily before a meal. (Patient not taking: Reported on 08/16/2020) 08/16/2020: Side effect - nausea/vomiting  . TRULICITY 4.5 NW/2.9FA SOPN INJECT 4.5 MG AS DIRECTED ONCE A WEEK.    No facility-administered encounter medications on file as of 09/24/2020.   Recent Relevant Labs: Lab Results  Component Value Date/Time   HGBA1C 7.7 (H) 08/05/2020 09:23 AM   HGBA1C 6.8 (A) 04/25/2020 09:58 AM   HGBA1C 7.1 (H) 03/13/2020 09:20 AM   HGBA1C 6.7 04/21/2018 08:04 AM   MICROALBUR 3.5 (H) 02/04/2016 10:23 AM   MICROALBUR 0.8 11/30/2014 03:56 PM     Kidney Function Lab Results  Component Value Date/Time   CREATININE 0.97 04/26/2020 08:47 AM   CREATININE 0.85 10/26/2019 09:03 AM   GFR 58.77 (L) 04/26/2020 08:47 AM   GFRNONAA >60 08/21/2015 04:09 AM   GFRAA >60 08/21/2015 04:09 AM    . Current antih yperglycemic regimen:   Trulicity 4.5 mg once weekly   . What recent interventions/DTPs have been made to improve glycemic control:  o None noted  . Have there been any recent hospitalizations or ED visits since last visit with CPP? No   . Patient reports hypoglycemic symptoms, including  Sweaty and shaky  . Patient reports hyperglycemic symptoms, including excessive thirst   . How often are you checking your blood sugar? 3-4 times daily   . What are your blood sugars ranging?  o Fasting:  o Before meals: 150 o After meals: 206 o Bedtime:   . During the week, how often does your blood glucose drop below 70? Once a week Patient states her blood sugar has dropped down to 60  . Are you checking your feet daily/regularly?   Patient states she does check her feet daily  Adherence Review: Is the patient currently on a STATIN medication? Yes Is the patient currently on ACE/ARB medication? Yes Does the patient have >5 day gap between last estimated fill dates? No   Star Rating Drugs: Atorvastatin  10 mg Last filled:08/19/2020 90 DS Losartan 100 mg Last filled:09/08/2020 90 DS  Ethelene Hal Clinical Pharmacist Assistant [phone]  Time spent:33

## 2020-09-26 NOTE — Progress Notes (Signed)
  Chronic Care Management Pharmacy Assistant   Name: Heather [name]  MRN: [id number] DOB: 11/14/1950    Reason for Encounter: Disease State Diabetic Call   Conditions to be addressed/monitored: DMII   Recent office visits:  None ID  Recent consult visits:  None ID  Hospital visits:  None in previous 6 months  Medications: Outpatient Encounter Medications as of 09/19/2020  Medication Sig Note  . atorvastatin (LIPITOR) 10 MG tablet TAKE 1 TABLET BY MOUTH EVERY DAY   . Blood Glucose Monitoring Suppl (ONE TOUCH ULTRA 2) w/Device KIT 1 each by Does not apply route 4 (four) times daily. Use device to monitor glucose levels 4 times per day; E11.9   . Cyanocobalamin 2500 MCG TABS Take 2,500 mcg by mouth daily.    . diclofenac Sodium (VOLTAREN) 1 % GEL Apply 4 g topically 4 (four) times daily. To affected joint.   . Famotidine (PEPCID PO) 1 tablet as needed.    . gabapentin (NEURONTIN) 600 MG tablet TAKE 2 TABLETS BY MOUTH AT BEDTIME   . hydrochlorothiazide (HYDRODIURIL) 25 MG tablet TAKE 1 TABLET BY MOUTH EVERY DAY   . Lancets (ONETOUCH DELICA PLUS [id number]) MISC Use to test blood sugars once daily   . losartan (COZAAR) 100 MG tablet TAKE 1 TABLET BY MOUTH EVERY DAY   . multivitamin-lutein (OCUVITE-LUTEIN) CAPS capsule Take 1 capsule by mouth daily.   . ONETOUCH ULTRA test strip TEST ONCE DAILY   . repaglinide (PRANDIN) 0.5 MG tablet Take 1 tablet (0.5 mg total) by mouth 2 (two) times daily before a meal. (Patient not taking: Reported on 08/16/2020) 08/16/2020: Side effect - nausea/vomiting  . TRULICITY 4.5 MG/0.5ML SOPN INJECT 4.5 MG AS DIRECTED ONCE A WEEK.    No facility-administered encounter medications on file as of 09/19/2020.   Recent Relevant Labs: Lab Results  Component Value Date/Time   HGBA1C 7.7 (H) 08/05/2020 09:23 AM   HGBA1C 6.8 (A) 04/25/2020 09:58 AM   HGBA1C 7.1 (H) 03/13/2020 09:20 AM   HGBA1C 6.7 04/21/2018 08:04 AM   MICROALBUR 3.5 (H) 02/04/2016 10:23 AM    MICROALBUR 0.8 11/30/2014 03:56 PM    Kidney Function Lab Results  Component Value Date/Time   CREATININE 0.97 04/26/2020 08:47 AM   CREATININE 0.85 10/26/2019 09:03 AM   GFR 58.77 (L) 04/26/2020 08:47 AM   GFRNONAA >60 08/21/2015 04:09 AM   GFRAA >60 08/21/2015 04:09 AM    . Current antihyperglycemic regimen:  Trulicity 4.5mg/0.5ml inject once week  . What recent interventions/DTPs have been made to improve glycemic control:  Patient states that Dr. Ellison stopped the repaglinide because it lowered her blood sugar and made her sick to the stomach shaky  . Have there been any recent hospitalizations or ED visits since last visit with [facility]PP? Yes   . Patient reports hypoglycemic symptoms, including Sweaty and Nervous/irritable   . Patient denies hyperglycemic symptoms, including none   . How often are you checking your blood sugar? Patient states that she was told that she does not need to check blood sugar daily . What are your blood sugars ranging? 150-160 o Fasting:  o Before meals: 113 o After meals:  o Bedtime:  . During the week, how often does your blood glucose drop below 70? Never, the lowest this week was 113  . Are you checking your feet daily/regularly? Patient states that she has a callous on her foot but no swelling, or open sores. Patient states she can't feel toes or feet   when she is walking  Adherence Review: Is the patient currently on a STATIN medication? Yes Is the patient currently on ACE/ARB medication? Yes Does the patient have >5 day gap between last estimated fill dates? No  Star Rating Drugs: Atorvastatin 08/19/20 90 ds Losartan 09/08/20 90 ds  [facility] Pharmacist Assistant [phone]  Time spent:40

## 2020-10-15 NOTE — Progress Notes (Signed)
    Chronic Care Management Pharmacy Assistant   Name: Heather [name]  MRN: [id number] DOB: 1951/02/18  Reason for Encounter: Chart Review    Medications: Outpatient Encounter Medications as of 09/24/2020  Medication Sig Note  . atorvastatin (LIPITOR) 10 MG tablet TAKE 1 TABLET BY MOUTH EVERY DAY   . Blood Glucose Monitoring Suppl (ONE TOUCH ULTRA 2) w/Device KIT 1 each by Does not apply route 4 (four) times daily. Use device to monitor glucose levels 4 times per day; E11.9   . Cyanocobalamin 2500 MCG TABS Take 2,500 mcg by mouth daily.    . diclofenac Sodium (VOLTAREN) 1 % GEL Apply 4 g topically 4 (four) times daily. To affected joint.   . Famotidine (PEPCID PO) 1 tablet as needed.    . gabapentin (NEURONTIN) 600 MG tablet TAKE 2 TABLETS BY MOUTH AT BEDTIME   . hydrochlorothiazide (HYDRODIURIL) 25 MG tablet TAKE 1 TABLET BY MOUTH EVERY DAY   . Lancets (ONETOUCH DELICA PLUS [id number]) MISC Use to test blood sugars once daily   . losartan (COZAAR) 100 MG tablet TAKE 1 TABLET BY MOUTH EVERY DAY   . multivitamin-lutein (OCUVITE-LUTEIN) CAPS capsule Take 1 capsule by mouth daily.   [name] test strip TEST ONCE DAILY   . repaglinide (PRANDIN) 0.5 MG tablet Take 1 tablet (0.5 mg total) by mouth 2 (two) times daily before a meal. (Patient not taking: Reported on 08/16/2020) 08/16/2020: Side effect - nausea/vomiting  . TRULICITY 4.5 LP/3.7TK SOPN INJECT 4.5 MG AS DIRECTED ONCE A WEEK.    No facility-administered encounter medications on file as of 09/24/2020.    Pharmacist Review  Reviewed chart for medication changes and adherence.  No OVs, Consults, or hospital visits since last care coordination call / Pharmacist visit. No medication changes indicated  No gaps in adherence identified. Patient has follow up scheduled with pharmacy team. No further action required.  [place] Pharmacist Assistant [phone]  Time spent:9

## 2020-10-24 ENCOUNTER — Other Ambulatory Visit: Payer: Self-pay

## 2020-10-24 NOTE — Patient Instructions (Addendum)
  Blood work was ordered.      Medications changes include :   None    Please followup in 6 months

## 2020-10-24 NOTE — Progress Notes (Signed)
Subjective:    Patient ID: Heather [name], female    DOB: 12/30/1950, 70 y.o.   MRN: [id number]  HPI The patient is here for follow up of their chronic medical problems, including DM with neuropathy, htn, hld  She has lost weight.  She is doing a MORPH program through [facility].  She is walking.  Medications and allergies reviewed with patient and updated if appropriate.  Patient Active Problem List   Diagnosis Date Noted   Aortic atherosclerosis ([place]) 10/25/2020   BMI 35.0-35.9,adult 04/26/2020   GERD (gastroesophageal reflux disease) 04/26/2020   Vitamin D deficiency 04/25/2020   Arthralgia 10/26/2019   Dry cough 11/22/2017   Nocturnal leg cramps 02/02/2017   DOE (dyspnea on exertion) 08/04/2016   Morbid obesity ([place]) 08/04/2016   Generalized osteoarthritis of hand 02/04/2016   Diabetic polyneuropathy associated with diabetes mellitus due to underlying condition ([place]) 11/27/2015   Hiatal hernia, large 08/02/2015   Colon polyps 08/02/2015   Iron deficiency 04/21/2015   Diabetes type 2, controlled ([place]) 05/03/2014   Hyperlipidemia, mixed 03/18/2014   Interstitial cystitis 07/08/2012   Essential hypertension 02/10/2011    Current Outpatient Medications on File Prior to Visit  Medication Sig Dispense Refill   atorvastatin (LIPITOR) 10 MG tablet TAKE 1 TABLET BY MOUTH EVERY DAY 90 tablet 1   Blood Glucose Monitoring Suppl (ONE TOUCH ULTRA 2) w/Device KIT 1 each by Does not apply route 4 (four) times daily. Use device to monitor glucose levels 4 times per day; E11.9 1 kit 0   diclofenac Sodium (VOLTAREN) 1 % GEL Apply 4 g topically 4 (four) times daily. To affected joint. 400 g 11   Famotidine (PEPCID PO) 1 tablet as needed.      gabapentin (NEURONTIN) 600 MG tablet TAKE 2 TABLETS BY MOUTH AT BEDTIME 180 tablet 1   hydrochlorothiazide (HYDRODIURIL) 25 MG tablet TAKE 1 TABLET BY MOUTH EVERY DAY 90 tablet 1   Lancets (ONETOUCH DELICA PLUS [id number]) MISC Use to test blood  sugars once daily 100 each 3   losartan (COZAAR) 100 MG tablet TAKE 1 TABLET BY MOUTH EVERY DAY 90 tablet 1   multivitamin-lutein (OCUVITE-LUTEIN) CAPS capsule Take 1 capsule by mouth daily.     ONETOUCH ULTRA test strip TEST ONCE DAILY 656 strip 3   TRULICITY 4.5 CL/2.7NT SOPN INJECT 4.5 MG AS DIRECTED ONCE A WEEK. 12 mL 3   No current facility-administered medications on file prior to visit.    Past Medical History:  Diagnosis Date   Anemia    Colon polyps    Diabetes mellitus without complication (HCC)    Environmental allergies    GERD (gastroesophageal reflux disease)    Heart murmur    History of DVT (deep vein thrombosis) 1970'S   History of hiatal hernia    History of transfusion    Hyperlipidemia    Hypertension    Interstitial cystitis    Dr Janice Norrie   Shortness of breath dyspnea    "due to low hemaglobin"    Past Surgical History:  Procedure Laterality Date   APPENDECTOMY     BILATERAL SALPINGOOPHORECTOMY     painful cysts   CATARACT EXTRACTION, BILATERAL     COLONOSCOPY  2005   negative; Dr Earlean Shawl. F/U declined   CYSTOSCOPY     X 4; Dr Janice Norrie   LAPAROSCOPIC RIGHT COLECTOMY Right 08/19/2015   Procedure: LAPAROSCOPIC ASSISTED  RIGHT COLECTOMY;  Surgeon: Alphonsa Overall, MD;  Location: [facility];  Service: General;  Laterality: Right;   PREMAGLINANT POLYPS     TOTAL ABDOMINAL HYSTERECTOMY     metromenorrhagia    Social History   Socioeconomic History   Marital status: Widowed    Spouse name: Not on file   Number of children: 2   Years of education: Not on file   Highest education level: Not on file  Occupational History   Not on file  Tobacco Use   Smoking status: Former    Pack years: 0.00    Types: Cigarettes    Quit date: 05/18/1984    Years since quitting: 36.4   Smokeless tobacco: Never   Tobacco comments:    smoked 1972-1986, up to 1 ppd  Vaping Use   Vaping Use: Never used  Substance and Sexual Activity   Alcohol use: No    Alcohol/week: 0.0  standard drinks   Drug use: No   Sexual activity: Never  Other Topics Concern   Not on file  Social History Narrative   Lives with brother, daughter and 2 grandchildren in a 3 story home.  Has 2 children.  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     Highest level of education:  2 years of graduate school   Social Determinants of Health   Financial Resource Strain: Not on file  Food Insecurity: No Food Insecurity   Worried About Charity fundraiser in the Last Year: Never true   Ran Out of Food in the Last Year: Never true  Transportation Needs: No Transportation Needs   Lack of Transportation (Medical): No   Lack of Transportation (Non-Medical): No  Physical Activity: Inactive   Days of Exercise per Week: 0 days   Minutes of Exercise per Session: 0 min  Stress: No Stress Concern Present   Feeling of Stress : Not at all  Social Connections: Not on file    Family History  Problem Relation Age of Onset   Heart attack Mother        in 28s   Hypertension Mother    Stroke Mother         in 15s   Other Father        Deceased, 21   Stroke Maternal Grandfather 67   Heart attack Paternal Grandfather        in 28s   Breast cancer Sister    Healthy Brother    Healthy Son    Healthy Daughter    Diabetes Neg Hx     Review of Systems  Constitutional:  Negative for fever.  Respiratory:  Negative for cough, shortness of breath and wheezing.   Cardiovascular:  Positive for leg swelling (occ at night with heat). Negative for chest pain and palpitations.  Neurological:  Positive for headaches (occ with heat). Negative for light-headedness.      Objective:   Vitals:   10/25/20 0748  BP: 120/78  Pulse: 65  Temp: 98.5 F (36.9 C)  SpO2: 97%   BP Readings from Last 3 Encounters:  10/25/20 120/78  04/26/20 122/76  01/09/20 120/70   Wt Readings from Last 3 Encounters:  10/25/20 172 lb (78 kg)  04/26/20 184 lb 3.2 oz (83.6  kg)  04/25/20 189 lb (85.7 kg)   Body mass index is 33.59 kg/m.   Physical Exam    Constitutional: Appears well-developed and well-nourished. No distress.  HENT:  Head: Normocephalic and atraumatic.  Neck: Neck supple. No tracheal deviation present. No  thyromegaly present.  No cervical lymphadenopathy Cardiovascular: Normal rate, regular rhythm and normal heart sounds.   No murmur heard. No carotid bruit .  No edema Pulmonary/Chest: Effort normal and breath sounds normal. No respiratory distress. No has no wheezes. No rales.  Skin: Skin is warm and dry. Not diaphoretic.  Psychiatric: Normal mood and affect. Behavior is normal.      Assessment & Plan:    See Problem List for Assessment and Plan of chronic medical problems.    This visit occurred during the SARS-CoV-2 public health emergency.  Safety protocols were in place, including screening questions prior to the visit, additional usage of staff PPE, and extensive cleaning of exam room while observing appropriate contact time as indicated for disinfecting solutions.

## 2020-10-25 ENCOUNTER — Ambulatory Visit (INDEPENDENT_AMBULATORY_CARE_PROVIDER_SITE_OTHER): Payer: Medicare Other | Admitting: Internal Medicine

## 2020-10-25 ENCOUNTER — Encounter: Payer: Self-pay | Admitting: Internal Medicine

## 2020-10-25 VITALS — BP 120/78 | HR 65 | Temp 98.5°F | Ht 60.0 in | Wt 172.0 lb

## 2020-10-25 DIAGNOSIS — E669 Obesity, unspecified: Secondary | ICD-10-CM

## 2020-10-25 DIAGNOSIS — K439 Ventral hernia without obstruction or gangrene: Secondary | ICD-10-CM | POA: Diagnosis not present

## 2020-10-25 DIAGNOSIS — I700 Atherosclerosis of aorta: Secondary | ICD-10-CM | POA: Diagnosis not present

## 2020-10-25 DIAGNOSIS — E0842 Diabetes mellitus due to underlying condition with diabetic polyneuropathy: Secondary | ICD-10-CM

## 2020-10-25 DIAGNOSIS — E782 Mixed hyperlipidemia: Secondary | ICD-10-CM | POA: Diagnosis not present

## 2020-10-25 DIAGNOSIS — E1142 Type 2 diabetes mellitus with diabetic polyneuropathy: Secondary | ICD-10-CM | POA: Diagnosis not present

## 2020-10-25 DIAGNOSIS — K469 Unspecified abdominal hernia without obstruction or gangrene: Secondary | ICD-10-CM | POA: Insufficient documentation

## 2020-10-25 DIAGNOSIS — I10 Essential (primary) hypertension: Secondary | ICD-10-CM

## 2020-10-25 DIAGNOSIS — E66811 Obesity, class 1: Secondary | ICD-10-CM

## 2020-10-25 LAB — LIPID PANEL
Cholesterol: 186 mg/dL (ref 0–200)
HDL: 55.3 mg/dL (ref >39.00)
LDL Cholesterol: 105 mg/dL — ABNORMAL HIGH (ref 0–99)
NonHDL: 130.71
Total CHOL/HDL Ratio: 3
Triglycerides: 128 mg/dL (ref 0.0–149.0)
VLDL: 25.6 mg/dL (ref 0.0–40.0)

## 2020-10-25 LAB — COMPREHENSIVE METABOLIC PANEL
ALT: 17 U/L (ref 0–35)
AST: 17 U/L (ref 0–37)
Albumin: 4.4 g/dL (ref 3.5–5.2)
Alkaline Phosphatase: 56 U/L (ref 39–117)
BUN: 12 mg/dL (ref 6–23)
CO2: 30 mEq/L (ref 19–32)
Calcium: 10 mg/dL (ref 8.4–10.5)
Chloride: 100 mEq/L (ref 96–112)
Creatinine, Ser: 0.84 mg/dL (ref 0.40–1.20)
GFR: 69.6 mL/min (ref >60.00)
Glucose, Bld: 114 mg/dL — ABNORMAL HIGH (ref 70–99)
Potassium: 4.1 mEq/L (ref 3.5–5.1)
Sodium: 137 mEq/L (ref 135–145)
Total Bilirubin: 1.3 mg/dL — ABNORMAL HIGH (ref 0.2–1.2)
Total Protein: 7.3 g/dL (ref 6.0–8.3)

## 2020-10-25 LAB — HEMOGLOBIN A1C: Hgb A1c MFr Bld: 7 % — ABNORMAL HIGH (ref 4.6–6.5)

## 2020-10-25 MED ORDER — LOSARTAN POTASSIUM 100 MG PO TABS: 1.0000 | ORAL_TABLET | Freq: Every day | ORAL | 1 refills | Status: DC

## 2020-10-25 MED ORDER — HYDROCHLOROTHIAZIDE 25 MG PO TABS: 1.0000 | ORAL_TABLET | Freq: Every day | ORAL | 1 refills | Status: DC

## 2020-10-25 MED ORDER — ATORVASTATIN CALCIUM 10 MG PO TABS: 1.0000 | ORAL_TABLET | Freq: Every day | ORAL | 1 refills | Status: DC

## 2020-10-25 MED ORDER — GABAPENTIN 600 MG PO TABS: 1200.0000 mg | ORAL_TABLET | Freq: Every day | ORAL | 1 refills | Status: DC

## 2020-10-25 MED ORDER — TRULICITY 4.5 MG/0.5ML ~~LOC~~ SOAJ: 4.5000 mg | SUBCUTANEOUS | 3 refills | Status: DC

## 2020-10-25 NOTE — Assessment & Plan Note (Signed)
Chronic Doing MORPH program at [facility] Walking regularly She is eating more protein and veges, she has cut back on carbs

## 2020-10-25 NOTE — Assessment & Plan Note (Signed)
Chronic Ventral/incisional occ discomfort Monitor only

## 2020-10-25 NOTE — Assessment & Plan Note (Signed)
Chronic Check lipid panel, CMP Continue atorvastatin 10 mg daily Encouraged regular exercise and low-fat/cholesterol diet Encouraged weight loss

## 2020-10-25 NOTE — Assessment & Plan Note (Addendum)
Chronic She will stop seeing Dr Loanne Drilling and I will manage a1c today Continue trulicty 4.5 mg weekly Continue diabetic diet and wight loss efforts, regularly walking  Lab Results  Component Value Date   HGBA1C 7.7 (H) 08/05/2020

## 2020-10-25 NOTE — Assessment & Plan Note (Addendum)
Newly identified Discussed diagnosis Last LDL was at goal Check lipid panel today Continue atorvastatin 10 mg daily Encouraged heart healthy diet, regular exercise and weight loss, which she is working on

## 2020-10-25 NOTE — Assessment & Plan Note (Signed)
Chronic BP well controlled Continue hydrochlorothiazide 25 mg daily, losartan 100 mg daily cmp

## 2020-10-25 NOTE — Assessment & Plan Note (Signed)
Chronic Controlled Continue gabapentin 1200 mg at bedtime 

## 2020-10-25 NOTE — Addendum Note (Signed)
Addended by: Jacobo Forest on: 10/25/2020 08:32 AM   Modules accepted: Orders

## 2020-11-19 ENCOUNTER — Ambulatory Visit: Payer: Medicare Other | Admitting: Endocrinology

## 2020-11-20 ENCOUNTER — Telehealth: Payer: Medicare Other

## 2020-11-25 ENCOUNTER — Ambulatory Visit (INDEPENDENT_AMBULATORY_CARE_PROVIDER_SITE_OTHER): Payer: Medicare Other | Admitting: Pharmacist

## 2020-11-25 ENCOUNTER — Telehealth: Payer: Self-pay | Admitting: *Deleted

## 2020-11-25 ENCOUNTER — Other Ambulatory Visit: Payer: Self-pay

## 2020-11-25 DIAGNOSIS — I10 Essential (primary) hypertension: Secondary | ICD-10-CM | POA: Diagnosis not present

## 2020-11-25 DIAGNOSIS — E782 Mixed hyperlipidemia: Secondary | ICD-10-CM | POA: Diagnosis not present

## 2020-11-25 DIAGNOSIS — I700 Atherosclerosis of aorta: Secondary | ICD-10-CM

## 2020-11-25 DIAGNOSIS — E1142 Type 2 diabetes mellitus with diabetic polyneuropathy: Secondary | ICD-10-CM | POA: Diagnosis not present

## 2020-11-25 DIAGNOSIS — Z599 Problem related to housing and economic circumstances, unspecified: Secondary | ICD-10-CM

## 2020-11-25 DIAGNOSIS — F439 Reaction to severe stress, unspecified: Secondary | ICD-10-CM

## 2020-11-25 NOTE — Progress Notes (Signed)
Chronic Care Management Pharmacy Note  11/25/2020 Name:  Heather [name] MRN:  [id number] DOB:  [birth year]  Summary: -Pt reports struggling financially which is causing a lot of stress, she is having bouts of nausea about twice a week which seem unrelated to PO intake and may be related to stress  Recommendations/Changes made from today's visit: -Referral to [place] for community resources -Referral to CCM LCSW for mental health counseling    Subjective: Heather [name] is an 70 y.o. year old female who is a primary patient of Burns, [name], MD.  The CCM team was consulted for assistance with disease management and care coordination needs.    Engaged with patient by telephone for follow up visit in response to provider referral for pharmacy case management and/or care coordination services.   Consent to Services:  The patient was given information about Chronic Care Management services, agreed to services, and gave verbal consent prior to initiation of services.  Please see initial visit note for detailed documentation.   Patient Care Team: Binnie Rail, MD as PCP - General (Internal Medicine) Richmond Campbell, MD as Consulting Physician (Gastroenterology) Renato Shin, MD as Consulting Physician (Endocrinology) Charlton Haws, [facility] as Pharmacist (Pharmacist)  Recent office visits: 10/25/20 Dr Quay Burow OV: chronic f/u; newly identified aortic atherosclerosis; pt will stop seeing Dr Loanne Drilling and PCP will manage DM. A1c improved to 7.0.  04/26/20 Dr Quay Burow OV: chronic f/u. Try slow-release iron 2-3x per week w/ stool softener.  Recent consult visits: 08/01/20 Dr Loanne Drilling (endocrine): A1c increase to 7.7. Added repaglinide 0.5 mg BID w/meals. No metformin d/t short bowel syndrome.  Hospital visits: None in previous 6 months  Objective:  Lab Results  Component Value Date   CREATININE 0.84 10/25/2020   BUN 12 10/25/2020   GFR 69.60 10/25/2020   GFRNONAA >60  08/21/2015   GFRAA >60 08/21/2015   NA 137 10/25/2020   K 4.1 10/25/2020   CALCIUM 10.0 10/25/2020   CO2 30 10/25/2020   GLUCOSE 114 (H) 10/25/2020    Lab Results  Component Value Date/Time   HGBA1C 7.0 (H) 10/25/2020 08:32 AM   HGBA1C 7.7 (H) 08/05/2020 09:23 AM   HGBA1C 6.7 04/21/2018 08:04 AM   FRUCTOSAMINE 282 04/26/2020 08:49 AM   GFR 69.60 10/25/2020 08:32 AM   GFR 58.77 (L) 04/26/2020 08:47 AM   MICROALBUR 3.5 (H) 02/04/2016 10:23 AM   MICROALBUR 0.8 11/30/2014 03:56 PM    Last diabetic Eye exam:  Lab Results  Component Value Date/Time   HMDIABEYEEXA No Retinopathy 07/04/2019 12:00 AM    Last diabetic Foot exam: No results found for: HMDIABFOOTEX   Lab Results  Component Value Date   CHOL 186 10/25/2020   HDL 55.30 10/25/2020   LDLCALC 105 (H) 10/25/2020   LDLDIRECT 108.0 04/27/2019   TRIG 128.0 10/25/2020   CHOLHDL 3 10/25/2020    Hepatic Function Latest Ref Rng & Units 10/25/2020 04/26/2020 10/26/2019  Total Protein 6.0 - 8.3 g/dL 7.3 7.4 7.0  Albumin 3.5 - 5.2 g/dL 4.4 4.2 4.2  AST 0 - 37 U/L '17 17 20  ' ALT 0 - 35 U/L '17 18 27  ' Alk Phosphatase 39 - 117 U/L 56 57 59  Total Bilirubin 0.2 - 1.2 mg/dL 1.3(H) 1.3(H) 1.2  Bilirubin, Direct 0.0 - 0.3 mg/dL - - -    Lab Results  Component Value Date/Time   TSH 1.14 03/13/2020 09:20 AM   TSH 1.95 11/25/2018 08:49 AM    CBC  Latest Ref Rng & Units 04/26/2020 10/26/2019 04/27/2019  WBC 4.0 - 10.5 K/uL 6.2 5.8 7.8  Hemoglobin 12.0 - 15.0 g/dL 12.9 12.3 11.8(L)  Hematocrit 36.0 - 46.0 % 37.6 36.3 36.3  Platelets 150.0 - 400.0 K/uL 381.0 307.0 381.0   Iron/TIBC/Ferritin/ %Sat    Component Value Date/Time   IRON 68 04/26/2020 0847   TIBC 399 04/26/2020 0847   FERRITIN 32 04/26/2020 0847   IRONPCTSAT 17 04/26/2020 0847    Lab Results  Component Value Date/Time   VD25OH 42.36 04/26/2020 08:47 AM   VD25OH 27.97 (L) 11/30/2014 03:56 PM   Clinical ASCVD: No  The 10-year ASCVD risk score Mikey Bussing DC Jr., et al.,  2013) is: 24.6%   Values used to calculate the score:     Age: 70 years     Sex: Female     Is Non-Hispanic African American: No     Diabetic: Yes     Tobacco smoker: No     Systolic Blood Pressure: 631 mmHg     Is BP treated: Yes     HDL Cholesterol: 55.3 mg/dL     Total Cholesterol: 186 mg/dL    Depression screen [facility] 2/9 06/26/2020 04/26/2020 05/25/2018  Decreased Interest 0 0 0  Down, Depressed, Hopeless 1 0 0  PHQ - 2 Score 1 0 0  Altered sleeping - - 0  Tired, decreased energy - - 0  Change in appetite - - 0  Feeling bad or failure about yourself  - - 0  Trouble concentrating - - 0  Moving slowly or fidgety/restless - - 0  Suicidal thoughts - - 0  PHQ-9 Score - - 0  Difficult doing work/chores - - -     BP Readings from Last 3 Encounters:  10/25/20 120/78  04/26/20 122/76  01/09/20 120/70   Pulse Readings from Last 3 Encounters:  10/25/20 65  04/26/20 80  04/25/20 76   Wt Readings from Last 3 Encounters:  10/25/20 172 lb (78 kg)  04/26/20 184 lb 3.2 oz (83.6 kg)  04/25/20 189 lb (85.7 kg)   BMI Readings from Last 3 Encounters:  10/25/20 33.59 kg/m  04/26/20 35.97 kg/m  04/25/20 36.91 kg/m    Assessment/Interventions: Review of patient past medical history, allergies, medications, health status, including review of consultants reports, laboratory and other test data, was performed as part of comprehensive evaluation and provision of chronic care management services.   SDOH:  (Social Determinants of Health) assessments and interventions performed: Yes  SDOH Screenings   Alcohol Screen: Low Risk    Last Alcohol Screening Score (AUDIT): 0  Depression (PHQ2-9): Low Risk    PHQ-2 Score: 1  Financial Resource Strain: Not on file  Food Insecurity: No Food Insecurity   Worried About Charity fundraiser in the Last Year: Never true   Ran Out of Food in the Last Year: Never true  Housing: Low Risk    Last Housing Risk Score: 0  Physical Activity: Inactive    Days of Exercise per Week: 0 days   Minutes of Exercise per Session: 0 min  Social Connections: Not on file  Stress: No Stress Concern Present   Feeling of Stress : Not at all  Tobacco Use: Medium Risk   Smoking Tobacco Use: Former   Smokeless Tobacco Use: Never  Transportation Needs: No Data processing manager (Medical): No   Lack of Transportation (Non-Medical): No    CCM Care Plan  Allergies  Allergen Reactions  [place] [Dapagliflozin]     Constant Vaginal Infections   Flagyl [Metronidazole] Other (See Comments)    Made really sick. Was able to tolerate dose 4/17 with zofran   Jardiance [Empagliflozin]     Constant Vaginal infections   Lisinopril     04/03/15 cough reported X several months   Cholestyramine Other (See Comments)    Joint pain   Prandin [Repaglinide] Nausea Only   Sulfonamide Derivatives     nausea    Medications Reviewed Today     Reviewed by Binnie Rail, MD (Physician) on 10/25/20 at [place] Status: <None>   Medication Order Taking? Sig Documenting Provider Last Dose Status Informant  atorvastatin (LIPITOR) 10 MG tablet [id number]  TAKE 1 TABLET BY MOUTH EVERY DAY Burns, [name], MD  Active   Blood Glucose Monitoring Suppl (ONE TOUCH ULTRA 2) w/Device KIT [id number]  1 each by Does not apply route 4 (four) times daily. Use device to monitor glucose levels 4 times per day; E11.9 Renato Shin, MD  Active   diclofenac Sodium (VOLTAREN) 1 % GEL [id number]  Apply 4 g topically 4 (four) times daily. To affected joint. Gregor Hams, MD  Active   Famotidine (PEPCID PO) [id number]  1 tablet as needed.  [provider]  Active   gabapentin (NEURONTIN) 600 MG tablet [id number]  TAKE 2 TABLETS BY MOUTH AT BEDTIME Binnie Rail, MD  Active   hydrochlorothiazide (HYDRODIURIL) 25 MG tablet [id number]  TAKE 1 TABLET BY MOUTH EVERY DAY Burns, [name], MD  Active   Lancets [facility] DELICA PLUS [id number]) [place] [id number]  Use to test  blood sugars once daily Renato Shin, MD  Active   losartan (COZAAR) 100 MG tablet [id number]  TAKE 1 TABLET BY MOUTH EVERY DAY Burns, [name], MD  Active   multivitamin-lutein [facility]) CAPS capsule [id number]  Take 1 capsule by mouth daily. [provider]  Active   Donald Siva test strip [id number]  TEST ONCE DAILY Renato Shin, MD  Active   TRULICITY 4.5 HY/0.7PX SOPN [id number]  INJECT 4.5 MG AS DIRECTED ONCE A WEEK. Renato Shin, MD  Active             Patient Active Problem List   Diagnosis Date Noted   Aortic atherosclerosis ([place]) 10/25/2020   Abdominal hernia 10/25/2020   BMI 35.0-35.9,adult 04/26/2020   GERD (gastroesophageal reflux disease) 04/26/2020   Vitamin D deficiency 04/25/2020   Arthralgia 10/26/2019   Dry cough 11/22/2017   Nocturnal leg cramps 02/02/2017   DOE (dyspnea on exertion) 08/04/2016   Obesity (BMI 30.0-34.9) 08/04/2016   Generalized osteoarthritis of hand 02/04/2016   Diabetic polyneuropathy associated with diabetes mellitus due to underlying condition ([place]) 11/27/2015   Hiatal hernia, large 08/02/2015   Colon polyps 08/02/2015   Iron deficiency 04/21/2015   Diabetes type 2, controlled ([place]) 05/03/2014   Hyperlipidemia, mixed 03/18/2014   Interstitial cystitis 07/08/2012   Essential hypertension 02/10/2011    Immunization History  Administered Date(s) Administered   Hepatitis A 11/14/2003   Influenza Whole 01/17/2012   Influenza, High Dose Seasonal PF 02/20/2014, 01/15/2018, 12/26/2018, 01/15/2020   Influenza-Unspecified 02/24/2013, 02/19/2015, 01/24/2016, 01/07/2017   PFIZER Comirnaty(Gray Top)Covid-19 Tri-Sucrose Vaccine 08/21/2020   PFIZER(Purple Top)SARS-COV-2 Vaccination 06/06/2019, 06/24/2019, 02/13/2020   Pneumococcal Conjugate-13 03/07/2014   Pneumococcal Polysaccharide-23 08/02/2015   Td 06/11/2006   Tdap 02/04/2016   Zoster Recombinat (Shingrix) 09/08/2016, 03/08/2017   Zoster, Live 03/21/2014    Conditions  to be addressed/monitored:  Hypertension,  Hyperlipidemia and Diabetes, Aortic atherosclerosis  Care Plan : CCM Pharmacy Care Plan  Updates made by Charlton Haws, RPH since 11/25/2020 12:00 AM     Problem: Hypertension, Hyperlipidemia and Diabetes, Leg cramps   Priority: High     Long-Range Goal: Disease mangement   Start Date: 08/16/2020  Expected End Date: 02/15/2021  This Visit's Progress: On track  Recent Progress: On track  Priority: High  Note:   Current Barriers:  Unable to independently monitor therapeutic efficacy Unable to maintain control of diabetes  Pharmacist Clinical Goal(s):  Patient will achieve adherence to monitoring guidelines and medication adherence to achieve therapeutic efficacy maintain control of Diabetes as evidenced by A1c  through collaboration with PharmD and provider.   Interventions: 1:1 collaboration with Binnie Rail, MD regarding development and update of comprehensive plan of care as evidenced by provider attestation and co-signature Inter-disciplinary care team collaboration (see longitudinal plan of care) Comprehensive medication review performed; medication list updated in electronic medical record  Hypertension    BP goal < 130/80 Patient checks BP at home 1-2x per week Patient home BP readings are ranging: 130/80s  Patient has failed these meds in the past: n/a Patient is currently controlled on the following medications:  Losartan 100 mg daily HCTZ 25 mg daily   We discussed: BP is at goal; pt endorses some missed doses of medication related to forgetting/stress; she reports she has lost 40 lbs in the [facility] program w/ [facility], but the program is ending soon and pt is worried about losing the support of the program   Plan: Continue current medications and control with diet and exercise     Diabetes (Dx 2015)    A1c goal < 8% Checking BG: Daily Recent FBG Readings: 130-150 No low BG since stopping repaglinide   Patient has  failed these meds in past: Farxiga, Jardiance (mycotic infections), metformin (short gut), nateglinide, Januvia, actos (edema), repaglinide (vomiting) Patient is currently uncontrolled on the following medications:  Trulicity 4.5 mg once weekly (Wednesday) - $9.80 x 3 months (LIS)   We discussed: Pt has been experiencing nausea 1-2 x per week for last several weeks; she reports these episodes do not seem to be related to her diet; she does not think they are related to Trulicity either since she has always done well with the medication and her diet/meal sizes have not changed; pt does endorse significant stress related to finances - discussed how stress is related to GI issues -pt reports lifestyle modifications ([facility]ORPH program w/ [facility]) have been helping: Pt reports losing 40 lbs in the program. 11,000 steps per day, eating high-protein yogurt, sugar-free jello, trying hard to reduce carbs.   Plan: Continue current medications and control with diet and exercise    Hyperlipidemia    LDL goal < 70 Aortic atherosclerosis (Xray 2019)  Patient has failed these meds in past: n/a Patient is currently uncontrolled on the following medications:  Atorvastatin 10 mg daily     We discussed:  her LDL was increased most recently (105) when previously it had been at goal; stressed compliance with atorvastatin; can consider changing to rosuvastatin for higher potency in the future  Plan: Continue current medications and control with diet and exercise   Stress/Anxiety  Patient reports significant stress related to financial difficulties; she reports "money has run out", she is struggling to "make ends meet"; she is able to afford her medications since she has LIS, but food has become more difficult to afford, and  gas prices make it difficult to afford everything else. She recently converted DM care from endocrinology to PCP because she couldn't afford the specialist copays.  She appears to be having some  physical symptoms related to the stress (nausea/vomiting) and would benefit from discussion with Care Guide for resources and LCSW for mental health counseling  Plan: Referral to Care Guide and LCSW   Patient Goals/Self-Care Activities Patient will:  - take medications as prescribed -focus on medication adherence by pill box -check glucose daily, document, and provide at future appointments -target a minimum of 150 minutes of moderate intensity exercise weekly engage in dietary modifications by reducing carbs -Follow up with Care Guide and Social Worker as part of CCM services        Medication Assistance: None required.  Patient affirms current coverage meets needs. - LIS  Compliance/Adherence/Medication fill history: Care Gaps: Eye exam (due 07/03/20)  Star-Rating Drugs: Atorvastatin - LF 08/19/20 x 90 ds Losartan - LF 09/08/20 x 90 ds  Patient's preferred pharmacy is:  [id number] [place], [place] - [id number]: [phone]: [phone] [facility]  [address] [address]Phone: [phone]: [phone] Uses pill box? Yes Pt endorses 100% compliance  We discussed: Current pharmacy is preferred with insurance plan and patient is satisfied with pharmacy services Patient decided to: Continue current medication management strategy  Care Plan and Follow Up Patient Decision:  Patient agrees to Care Plan and Follow-up.  Plan: Telephone follow up appointment with care management team member scheduled for:  3 months  LCharlene Brooke PharmD, [place] CPP Clinical Pharmacist [phone]

## 2020-11-25 NOTE — Patient Instructions (Addendum)
Visit Information  Phone number for Pharmacist: [phone]  Listen for phone call from [place] and/or Social Worker.   Goals Addressed             This Visit's Progress    Manage My Medicine       Timeframe:  Long-Range Goal Priority:  High Start Date:     11/25/20                        Expected End Date:   05/28/21                    Follow Up Date Oct 2022   - call for medicine refill 2 or 3 days before it runs out - call if I am sick and can't take my medicine - keep a list of all the medicines I take; vitamins and herbals too - use a pillbox to sort medicine    Why is this important?   These steps will help you keep on track with your medicines.   Notes:         Patient verbalizes understanding of instructions provided today and agrees to view in [place].  Telephone follow up appointment with pharmacy team member scheduled for: 3 months  Charlene Brooke, PharmD, [place], CPP Clinical Pharmacist [facility] [phone]

## 2020-11-25 NOTE — Chronic Care Management (AMB) (Signed)
  Chronic Care Management   Note  11/25/2020 Name: Heather [name] MRN: [id number] DOB: May 24, 1950  Heather [name] is a 70 y.o. year old female who is a primary care patient of Quay Burow, [name], MD. Heather [name] is currently enrolled in care management services. An additional referral for Licensed Clinical SW  was placed.   Follow up plan: Telephone appointment with care management team member scheduled for:12/11/2020  [place] Management

## 2020-11-28 ENCOUNTER — Telehealth: Payer: Self-pay | Admitting: Licensed Clinical Social Worker

## 2020-11-28 NOTE — Telephone Encounter (Signed)
   11/28/2020  [place] [birth year] [phone]  PCP for Client: Dr. Binnie Rail, MD  LCSW Theadore Nan is covering for Eduard Clos LCSW today and Theadore Nan was asked to call client today to discuss stress and anxiety issues facing client.  LCSW Theadore Nan spoke via phone with client today and client had numerous concerns. Client spoke of financial challenges: difficulty paying for food, medications and copay amounts for medical visits. LCSW talked with client about local food pantry help but client did not feel comfortable going to local food pantries.  Client spoke of her responsibility of taking her granddaughter to and from granddaughter's job in [place], [place]. Client spoke of upcoming wedding of client's daughter.  LCSW talked with client about self care actions:  allowing time for rest, using time for hobbies or recreation (she likes to walk), eating meals on normal schedule, setting boundaries and not taking on additional responsibilities.    Client was concerned about financial debt. She said she has used credit cards to pay bills and has built up a debt owed on her credit cards. She and LCSW spoke of her looking into help with a group such as Neurosurgeon in [place] area to talk with her about budget planning, debt repayment structure, and managing her income and bills owed each month  Client is involved in a research program called [employer] through [facility].. She said she had been in this program since January of 2022. It involves monitoring of her weight, diet , and exercise such as walking.  She said in the past the program has provided her with support that has been very helpful to her.    She is having difficulty sleeping, has trouble paying for gas to drive vehicle. Her husband passed away about 10 years ago and she sleeps at night in a Lift Chair in her living room. She does receive Extra Help through [employer]. She lives at her  home with her brother, her daughter and two granddaughters. She said her brother is helping to make mortgage payments at this time.  She said her daughter works Retail buyer.  She said she had to refinance her mortgage due to inability to pay all her monthly bills.   She said she is having some memory challenges related to stress issues and may not always take her medications on schedule.  LCSW spoke with her about CCM program support and fact that LCSW Eduard Clos is scheduled to call her on December 11, 2020 at 1:00 PM to further discuss her needs.  LCSW Theadore Nan talked with Nunzio Cory about development of a Care Plan for client and asked client to be thinking of a key area that could be focused on in a care plan to be developed for her. She spoke of anxiety issues, managing anxiety issues and financial stressors.  LCSW Theadore Nan provided counseling support for client and used active listening techniques to allow client to share her feelings and concerns during phone call. LCSW encouraged her to use techniques of enjoying her walks, feeling breeze, looking at flowers, enjoying motion in walking of being there in the moment in her exercises and daily activities  LCSW thanked client for phone call today and client was appreciative of support and is looking forward to phone call with Eduard Clos on December 11, 2020 at 1:00 PM  Norva Riffle.Shuntell Foody MSW, LCSW Licensed Clinical Social Worker [facility] Care Management [phone]

## 2020-11-29 DIAGNOSIS — H26493 Other secondary cataract, bilateral: Secondary | ICD-10-CM | POA: Diagnosis not present

## 2020-11-29 DIAGNOSIS — Z961 Presence of intraocular lens: Secondary | ICD-10-CM | POA: Diagnosis not present

## 2020-11-29 DIAGNOSIS — H524 Presbyopia: Secondary | ICD-10-CM | POA: Diagnosis not present

## 2020-11-29 DIAGNOSIS — E119 Type 2 diabetes mellitus without complications: Secondary | ICD-10-CM | POA: Diagnosis not present

## 2020-11-29 DIAGNOSIS — H5212 Myopia, left eye: Secondary | ICD-10-CM | POA: Diagnosis not present

## 2020-11-29 DIAGNOSIS — H16221 Keratoconjunctivitis sicca, not specified as Sjogren's, right eye: Secondary | ICD-10-CM | POA: Diagnosis not present

## 2020-11-29 LAB — HM DIABETES EYE EXAM

## 2020-12-11 ENCOUNTER — Other Ambulatory Visit: Payer: Self-pay | Admitting: Family Medicine

## 2020-12-11 ENCOUNTER — Telehealth: Payer: Medicare Other | Admitting: *Deleted

## 2020-12-11 NOTE — Telephone Encounter (Signed)
Rx refill request approved per Dr. Corey's orders. 

## 2020-12-13 ENCOUNTER — Telehealth: Payer: Self-pay | Admitting: Pharmacist

## 2020-12-13 NOTE — Progress Notes (Signed)
    Chronic Care Management Pharmacy Assistant   Name: Heather [name]  MRN: [id number] DOB: 1950/12/12  Reason for Encounter: Chart Review   Pharmacist Review Reviewed chart for medication changes and adherence.  No OVs, Consults, or hospital visits since last care coordination call / Pharmacist visit. No medication changes indicated  No gaps in adherence identified. Patient has follow up scheduled with pharmacy team. No further action required.    Ethelene Hal Clinical Pharmacist Assistant [phone]   Time spent:10

## 2020-12-18 ENCOUNTER — Ambulatory Visit (INDEPENDENT_AMBULATORY_CARE_PROVIDER_SITE_OTHER): Payer: Medicare Other | Admitting: *Deleted

## 2020-12-18 DIAGNOSIS — F439 Reaction to severe stress, unspecified: Secondary | ICD-10-CM

## 2020-12-18 DIAGNOSIS — I10 Essential (primary) hypertension: Secondary | ICD-10-CM

## 2020-12-18 DIAGNOSIS — Z599 Problem related to housing and economic circumstances, unspecified: Secondary | ICD-10-CM

## 2020-12-18 DIAGNOSIS — E1142 Type 2 diabetes mellitus with diabetic polyneuropathy: Secondary | ICD-10-CM | POA: Diagnosis not present

## 2020-12-18 DIAGNOSIS — E0842 Diabetes mellitus due to underlying condition with diabetic polyneuropathy: Secondary | ICD-10-CM

## 2020-12-18 NOTE — Chronic Care Management (AMB) (Addendum)
Chronic Care Management    Clinical Social Work Note  12/18/2020 Name: Heather [name] MRN: [id number] DOB: 14-Mar-1951  [name] is a 70 y.o. year old female who is a primary care patient of Burns, [name], MD. The CCM team was consulted to assist the patient with chronic disease management and/or care coordination needs related to: [employer]  and Financial Difficulties related to limited income .   Engaged with patient by telephone for follow up visit in response to provider referral for social work chronic care management and care coordination services.   Consent to Services:  The patient was given information about Chronic Care Management services, agreed to services, and gave verbal consent prior to initiation of services.  Please see initial visit note for detailed documentation.   Patient agreed to services and consent obtained.   Assessment: Review of patient past medical history, allergies, medications, and health status, including review of relevant consultants reports was performed today as part of a comprehensive evaluation and provision of chronic care management and care coordination services.     SDOH (Social Determinants of Health) assessments and interventions performed:    Advanced Directives Status: Not addressed in this encounter.  CCM Care Plan  Allergies  Allergen Reactions   Farxiga [Dapagliflozin]     Constant Vaginal Infections   Flagyl [Metronidazole] Other (See Comments)    Made really sick. Was able to tolerate dose 4/17 with zofran   Jardiance [Empagliflozin]     Constant Vaginal infections   Lisinopril     04/03/15 cough reported X several months   Cholestyramine Other (See Comments)    Joint pain   Prandin [Repaglinide] Nausea Only   Sulfonamide Derivatives     nausea    Outpatient Encounter Medications as of 12/18/2020  Medication Sig   atorvastatin (LIPITOR) 10 MG tablet Take 1 tablet (10 mg total) by mouth daily.   Blood  Glucose Monitoring Suppl (ONE TOUCH ULTRA 2) w/Device KIT 1 each by Does not apply route 4 (four) times daily. Use device to monitor glucose levels 4 times per day; E11.9   diclofenac Sodium (VOLTAREN) 1 % GEL APPLY 4 GRAMS TO AFFECTED JOINTS 4 TIMES A DAY   Dulaglutide (TRULICITY) 4.5 YK/9.9IP SOPN Inject 4.5 mg as directed once a week.   Famotidine (PEPCID PO) 1 tablet as needed.    gabapentin (NEURONTIN) 600 MG tablet Take 2 tablets (1,200 mg total) by mouth at bedtime.   hydrochlorothiazide (HYDRODIURIL) 25 MG tablet Take 1 tablet (25 mg total) by mouth daily.   Lancets (ONETOUCH DELICA PLUS [id number]) MISC Use to test blood sugars once daily   losartan (COZAAR) 100 MG tablet Take 1 tablet (100 mg total) by mouth daily.   multivitamin-lutein (OCUVITE-LUTEIN) CAPS capsule Take 1 capsule by mouth daily.   ONETOUCH ULTRA test strip TEST ONCE DAILY   No facility-administered encounter medications on file as of 12/18/2020.    Patient Active Problem List   Diagnosis Date Noted   Aortic atherosclerosis ([place]) 10/25/2020   Abdominal hernia 10/25/2020   BMI 35.0-35.9,adult 04/26/2020   GERD (gastroesophageal reflux disease) 04/26/2020   Vitamin D deficiency 04/25/2020   Arthralgia 10/26/2019   Dry cough 11/22/2017   Nocturnal leg cramps 02/02/2017   DOE (dyspnea on exertion) 08/04/2016   Obesity (BMI 30.0-34.9) 08/04/2016   Generalized osteoarthritis of hand 02/04/2016   Diabetic polyneuropathy associated with diabetes mellitus due to underlying condition ([place]) 11/27/2015   Hiatal hernia, large 08/02/2015   Colon polyps  08/02/2015   Iron deficiency 04/21/2015   Diabetes type 2, controlled ([place]) 05/03/2014   Hyperlipidemia, mixed 03/18/2014   Interstitial cystitis 07/08/2012   Essential hypertension 02/10/2011    Conditions to be addressed/monitored: Depression and financial stressors ; Financial constraints  and Lacks knowledge of community resources  Care Plan : LCSW Plan of Care   Updates made by Deirdre Peer, LCSW since 12/18/2020 12:00 AM     Problem: Financial stress impacting overall health/mental health   Priority: High     Goal: Provide pt with resources/support to aide in reducing stress/depression   Start Date: 12/18/2020  Expected End Date: 01/30/2021  This Visit's Progress: On track  Priority: High  Note:   Current barriers:   Patient in need of assistance with connecting to community resources for Financial constraints related to limited income, Mental Health Concerns , and Lacks knowledge of community resource: s Acknowledges deficits with meeting this unmet need Patient is unable to independently navigate community resource options without care coordination support Clinical Goals:  patient will work with SW to address concerns related to depression patient will work with Care Guide to address needs related to community resources to assist with financial strains/stress Clinical Interventions:  Collaboration with Quay Burow, [name], MD regarding development and update of comprehensive plan of care as evidenced by provider attestation and co-signature Inter-disciplinary care team collaboration (see longitudinal plan of care) Assessment of needs, barriers , agencies contacted, as well as how impacting  Review various resources, discussed options and provided patient information about Financial constraints related to limited income Collaborated with appropriate clinical care team members regarding patient needs Financial constraints and Lacks knowledge of community resources, Depression Patient interviewed and appropriate assessments performed Referred patient to community resources care guide team for assistance with financial strains Provided mental health counseling with regard to depression  Other interventions provided: Depression screen reviewed , PHQ2/ PHQ9 completed, Solution-Focused Strategies, Active listening / Reflection utilized , Emotional  Supportive Provided, Problem Solving [place] , Psychoeducation for mental health needs , Motivational Interviewing, and Discussed referral to [place] to assist with connecting to mental health provider- pt to consider and call CSW if changes mind Patient Goals:  - -expect phone call from [place] with resources to help   - begin a notebook of services in my neighborhood or community - call 211 when I need some help - follow-up on any referrals for help I am given - make a list of family or friends that I can call   Follow Up Plan: Appointment scheduled for SW follow up with client by phone on:  01/17/21      Follow Up Plan: [place] will reach out to patient for assistance with financial support/resources. and Appointment scheduled for SW follow up with client by phone on: 01/17/21      Eduard Clos MSW, [place] Licensed Clinical Social Worker [place] [phone]

## 2020-12-19 ENCOUNTER — Telehealth: Payer: Self-pay | Admitting: Internal Medicine

## 2020-12-19 NOTE — Telephone Encounter (Signed)
   Telephone encounter was:  Successful.  12/19/2020 Name: Heather [name] MRN: [id number] DOB: [birth year]  Heather [name] is a 70 y.o. year old female who is a primary care patient of Burns, [name], MD . The community resource team was consulted for assistance with Food Insecurity and Financial Difficulties related to paying household bills  Care guide performed the following interventions: Patient provided with information about care guide support team and interviewed to confirm resource needs  Patient is not behind on bills as of now, she has maxed out her credit cards paying household bills up to now and is out of money and resources. Advised patient I will research and send available resources via email on file.  Follow Up Plan:  No further follow up planned at this time. The patient has been provided with needed resources.  April Green Care Guide, Embedded Care Coordination [place], Care Management Phone: [phone] Email: [email or web address]

## 2020-12-19 NOTE — Patient Instructions (Signed)
Visit Information   PATIENT GOALS:   Goals Addressed               This Visit's Progress     Find Help in My Community (pt-stated)        Timeframe:  Short-Term Goal Priority:  High Start Date:     12/18/20                        Expected End Date:   01/30/21                    Follow Up Date 01/17/21  -expect phone call from [place] with resources to help   - begin a notebook of services in my neighborhood or community - call 211 when I need some help - follow-up on any referrals for help I am given - make a list of family or friends that I can call    Why is this important?   Knowing how and where to find help for yourself or family in your neighborhood and community is an important skill.  You will want to take some steps to learn how.    Notes:         Consent to CCM Services: Heather [name] was given information about Chronic Care Management services including:  CCM service includes personalized support from designated clinical staff supervised by her physician, including individualized plan of care and coordination with other care providers 24/7 contact phone numbers for assistance for urgent and routine care needs. Service will only be billed when office clinical staff spend 20 minutes or more in a month to coordinate care. Only one practitioner may furnish and bill the service in a calendar month. The patient may stop CCM services at any time (effective at the end of the month) by phone call to the office staff. The patient will be responsible for cost sharing (co-pay) of up to 20% of the service fee (after annual deductible is met).  Patient agreed to services and verbal consent obtained.   The patient verbalized understanding of instructions, educational materials, and care plan provided today and declined offer to receive copy of patient instructions, educational materials, and care plan.   Telephone follow up appointment with care management team member  scheduled for:01/17/21  Eduard Clos MSW, LCSW Licensed Clinical Social Worker [place] [phone]   CLINICAL CARE PLAN: Patient Care Plan: CCM Pharmacy Care Plan     Problem Identified: Hypertension, Hyperlipidemia and Diabetes, Leg cramps   Priority: High     Long-Range Goal: Disease mangement   Start Date: 08/16/2020  Expected End Date: 02/15/2021  This Visit's Progress: On track  Recent Progress: On track  Priority: High  Note:   Current Barriers:  Unable to independently monitor therapeutic efficacy Unable to maintain control of diabetes  Pharmacist Clinical Goal(s):  Patient will achieve adherence to monitoring guidelines and medication adherence to achieve therapeutic efficacy maintain control of Diabetes as evidenced by A1c  through collaboration with PharmD and provider.   Interventions: 1:1 collaboration with Binnie Rail, MD regarding development and update of comprehensive plan of care as evidenced by provider attestation and co-signature Inter-disciplinary care team collaboration (see longitudinal plan of care) Comprehensive medication review performed; medication list updated in electronic medical record  Hypertension    BP goal < 130/80 Patient checks BP at home 1-2x per week Patient home BP readings are ranging: 130/80s  Patient has failed these meds  in the past: n/a Patient is currently controlled on the following medications:  Losartan 100 mg daily HCTZ 25 mg daily   We discussed: BP is at goal; pt endorses some missed doses of medication related to forgetting/stress; she reports she has lost 40 lbs in the [facility] program w/ [facility], but the program is ending soon and pt is worried about losing the support of the program   Plan: Continue current medications and control with diet and exercise     Diabetes (Dx 2015)    A1c goal < 8% Checking BG: Daily Recent FBG Readings: 130-150 No low BG since stopping repaglinide   Patient has failed  these meds in past: Farxiga, Jardiance (mycotic infections), metformin (short gut), nateglinide, Januvia, actos (edema), repaglinide (vomiting) Patient is currently uncontrolled on the following medications:  Trulicity 4.5 mg once weekly (Wednesday) - $9.80 x 3 months (LIS)   We discussed: Pt has been experiencing nausea 1-2 x per week for last several weeks; she reports these episodes do not seem to be related to her diet; she does not think they are related to Trulicity either since she has always done well with the medication and her diet/meal sizes have not changed; pt does endorse significant stress related to finances - discussed how stress is related to GI issues -pt reports lifestyle modifications ([facility] program w/ [facility]) have been helping: Pt reports losing 40 lbs in the program. 11,000 steps per day, eating high-protein yogurt, sugar-free jello, trying hard to reduce carbs.   Plan: Continue current medications and control with diet and exercise    Hyperlipidemia    LDL goal < 70 Aortic atherosclerosis (Xray 2019)  Patient has failed these meds in past: n/a Patient is currently uncontrolled on the following medications:  Atorvastatin 10 mg daily     We discussed:  her LDL was increased most recently (105) when previously it had been at goal; stressed compliance with atorvastatin; can consider changing to rosuvastatin for higher potency in the future  Plan: Continue current medications and control with diet and exercise   Stress/Anxiety  Patient reports significant stress related to financial difficulties; she reports "money has run out", she is struggling to "make ends meet"; she is able to afford her medications since she has LIS, but food has become more difficult to afford, and gas prices make it difficult to afford everything else. She recently converted DM care from endocrinology to PCP because she couldn't afford the specialist copays.  She appears to be having some physical  symptoms related to the stress (nausea/vomiting) and would benefit from discussion with Care Guide for resources and LCSW for mental health counseling  Plan: Referral to Care Guide and LCSW   Patient Goals/Self-Care Activities Patient will:  - take medications as prescribed -focus on medication adherence by pill box -check glucose daily, document, and provide at future appointments -target a minimum of 150 minutes of moderate intensity exercise weekly engage in dietary modifications by reducing carbs -Follow up with Care Guide and Social Worker as part of CCM services     Patient Care Plan: LCSW Plan of Care     Problem Identified: Financial stress impacting overall health/mental health   Priority: High     Goal: Provide pt with resources/support to aide in reducing stress/depression   Start Date: 12/18/2020  Expected End Date: 01/30/2021  This Visit's Progress: On track  Priority: High  Note:   Current barriers:   Patient in need of assistance with connecting to community  resources for Financial constraints related to limited income, Mental Health Concerns , and Lacks knowledge of community resource: s Acknowledges deficits with meeting this unmet need Patient is unable to independently navigate community resource options without care coordination support Clinical Goals:  patient will work with SW to address concerns related to depression patient will work with Care Guide to address needs related to community resources to assist with financial strains/stress Clinical Interventions:  CSW spoke with pt today and advised her of confirmation she will not have a copay for our program/support.  Pt acknowledges financial strains and the effect it has on her overall health/mental health. CSW completed a PHQ9 Depression Screening with pt today- pt scored "9" indicating mild- moderate.  Pt expressed grief from losing her husband in 2012 and soon after her mother as well.  Her brother  (retired English as a second language teacher) lived with she and her husband and was helpful during pt's husband's illness and death.  She did participate in some grief [facility] after losing her husband and may consider counseling support in the future but wants to think about it and wait until school begins as she is helping with great granddaughter.  Collaboration with Binnie Rail, MD regarding development and update of comprehensive plan of care as evidenced by provider attestation and co-signature Inter-disciplinary care team collaboration (see longitudinal plan of care) Assessment of needs, barriers , agencies contacted, as well as how impacting  Review various resources, discussed options and provided patient information about Financial constraints related to limited income Collaborated with appropriate clinical care team members regarding patient needs Financial constraints and Lacks knowledge of community resources, Depression Patient interviewed and appropriate assessments performed Referred patient to community resources care guide team for assistance with financial strains Provided mental health counseling with regard to depression  Other interventions provided: Depression screen reviewed , PHQ2/ PHQ9 completed, Solution-Focused Strategies, Active listening / Reflection utilized , Emotional Supportive Provided, Problem Solving [place] , Psychoeducation for mental health needs , Motivational Interviewing, and Discussed referral to [place] to assist with connecting to mental health provider- pt to consider and call CSW if changes mind Patient Goals:  - -expect phone call from [place] with resources to help   - begin a notebook of services in my neighborhood or community - call 211 when I need some help - follow-up on any referrals for help I am given - make a list of family or friends that I can call   Follow Up Plan: Appointment scheduled for SW follow up with client by phone on:   01/17/21

## 2021-01-17 DIAGNOSIS — Z1231 Encounter for screening mammogram for malignant neoplasm of breast: Secondary | ICD-10-CM | POA: Diagnosis not present

## 2021-01-17 LAB — HM MAMMOGRAPHY

## 2021-01-22 ENCOUNTER — Ambulatory Visit (INDEPENDENT_AMBULATORY_CARE_PROVIDER_SITE_OTHER): Payer: Medicare Other | Admitting: *Deleted

## 2021-01-22 DIAGNOSIS — F439 Reaction to severe stress, unspecified: Secondary | ICD-10-CM

## 2021-01-22 DIAGNOSIS — Z599 Problem related to housing and economic circumstances, unspecified: Secondary | ICD-10-CM

## 2021-01-22 DIAGNOSIS — E1142 Type 2 diabetes mellitus with diabetic polyneuropathy: Secondary | ICD-10-CM

## 2021-01-23 ENCOUNTER — Encounter: Payer: Self-pay | Admitting: Internal Medicine

## 2021-01-23 ENCOUNTER — Telehealth: Payer: Self-pay | Admitting: Pharmacist

## 2021-01-23 NOTE — Patient Instructions (Signed)
Visit Information  PATIENT GOALS:  Goals Addressed   None     The patient verbalized understanding of instructions, educational materials, and care plan provided today and declined offer to receive copy of patient instructions, educational materials, and care plan.   Telephone follow up appointment with care management team member scheduled for: Eduard Clos MSW, LCSW Licensed Clinical Social Worker [place] [phone]

## 2021-01-23 NOTE — Progress Notes (Signed)
Outside notes received. Information abstracted. Notes sent to scan.  

## 2021-01-23 NOTE — Progress Notes (Signed)
Chronic Care Management Pharmacy Assistant   Name: Heather [name]  MRN: [id number] DOB: 1950-05-25   Reason for Encounter: Disease State   Conditions to be addressed/monitored: DMII   Recent office visits:  None ID  Recent consult visits:  None ID  Hospital visits:  None in previous 6 months  Medications: Outpatient Encounter Medications as of 01/23/2021  Medication Sig   atorvastatin (LIPITOR) 10 MG tablet Take 1 tablet (10 mg total) by mouth daily.   Blood Glucose Monitoring Suppl (ONE TOUCH ULTRA 2) w/Device KIT 1 each by Does not apply route 4 (four) times daily. Use device to monitor glucose levels 4 times per day; E11.9   diclofenac Sodium (VOLTAREN) 1 % GEL APPLY 4 GRAMS TO AFFECTED JOINTS 4 TIMES A DAY   Dulaglutide (TRULICITY) 4.5 HW/2.9HB SOPN Inject 4.5 mg as directed once a week.   Famotidine (PEPCID PO) 1 tablet as needed.    gabapentin (NEURONTIN) 600 MG tablet Take 2 tablets (1,200 mg total) by mouth at bedtime.   hydrochlorothiazide (HYDRODIURIL) 25 MG tablet Take 1 tablet (25 mg total) by mouth daily.   Lancets (ONETOUCH DELICA PLUS [id number]) MISC Use to test blood sugars once daily   losartan (COZAAR) 100 MG tablet Take 1 tablet (100 mg total) by mouth daily.   multivitamin-lutein (OCUVITE-LUTEIN) CAPS capsule Take 1 capsule by mouth daily.   ONETOUCH ULTRA test strip TEST ONCE DAILY   No facility-administered encounter medications on file as of 01/23/2021.     Recent Relevant Labs: Lab Results  Component Value Date/Time   HGBA1C 7.0 (H) 10/25/2020 08:32 AM   HGBA1C 7.7 (H) 08/05/2020 09:23 AM   HGBA1C 6.7 04/21/2018 08:04 AM   MICROALBUR 3.5 (H) 02/04/2016 10:23 AM   MICROALBUR 0.8 11/30/2014 03:56 PM    Kidney Function Lab Results  Component Value Date/Time   CREATININE 0.84 10/25/2020 08:32 AM   CREATININE 0.97 04/26/2020 08:47 AM   GFR 69.60 10/25/2020 08:32 AM   GFRNONAA >60 08/21/2015 04:09 AM   GFRAA >60 08/21/2015 04:09 AM      Contacted patient on 01/23/21 to discuss diabetes disease state.   Current antihyperglycemic regimen:  Trulicity 4.5 mg once weekly    Patient verbally confirms she is taking the above medications as directed. Yes  What diet changes have been made to improve diabetes control?  What recent interventions/DTPs have been made to improve glycemic control:  None ID  Have there been any recent hospitalizations or ED visits since last visit with CPP? No  Patient denies hypoglycemic symptoms, including None  Patient denies hyperglycemic symptoms, including none  How often are you checking your blood sugar? once daily  What are your blood sugars ranging? 133-138 Fasting: 139 01/23/21  During the week, how often does your blood glucose drop below 70?  Not since she has been on trulicity  Are you checking your feet daily/regularly? No, but the bottom of feet peel and ar cracked  Adherence Review: Is the patient currently on a STATIN medication? Yes Is the patient currently on ACE/ARB medication? Yes Does the patient have >5 day gap between last estimated fill dates? No  Care Gaps: Annual wellness visit in last year? Yes,04/26/20 Most Recent BP reading:  If Diabetic: Most recent A1C reading:10/25/20 Last eye exam / retinopathy screening:7/22 Last diabetic foot exam:none noted  Star Rating Drugs:  Medication:  Last Fill: [facility] Losartan 100 mg 11/15/20  90 Atorvastatin 10 mg 11/14/20 90  CCM appointment on 02/19/21  [place] Pharmacist Assistant [phone]   Time spent:40

## 2021-01-23 NOTE — Chronic Care Management (AMB) (Signed)
Chronic Care Management    Clinical Social Work Note  01/23/2021 Name: Heather [name] MRN: [id number] DOB: [birth year]  Heather [name] is a 70 y.o. year old female who is a primary care patient of Burns, [name], MD. The CCM team was consulted to assist the patient with chronic disease management and/or care coordination needs related to: [employer] , Mental Health Counseling and Resources, Grief Counseling, and Financial Difficulties related to limited income .   Engaged with patient by telephone for follow up visit in response to provider referral for social work chronic care management and care coordination services.   Consent to Services:  The patient was given information about Chronic Care Management services, agreed to services, and gave verbal consent prior to initiation of services.  Please see initial visit note for detailed documentation.   Patient agreed to services and consent obtained.   Assessment: Review of patient past medical history, allergies, medications, and health status, including review of relevant consultants reports was performed today as part of a comprehensive evaluation and provision of chronic care management and care coordination services.     SDOH (Social Determinants of Health) assessments and interventions performed:  SDOH Interventions    Flowsheet Row Most Recent Value  SDOH Interventions   Depression Interventions/Treatment  Patient refuses Treatment  [does not want additional expenses- will check copay]        Advanced Directives Status: Not addressed in this encounter.  CCM Care Plan  Allergies  Allergen Reactions   Farxiga [Dapagliflozin]     Constant Vaginal Infections   Flagyl [Metronidazole] Other (See Comments)    Made really sick. Was able to tolerate dose 4/17 with zofran   Jardiance [Empagliflozin]     Constant Vaginal infections   Lisinopril     04/03/15 cough reported X several months   Cholestyramine Other (See  Comments)    Joint pain   Prandin [Repaglinide] Nausea Only   Sulfonamide Derivatives     nausea    Outpatient Encounter Medications as of 01/22/2021  Medication Sig   atorvastatin (LIPITOR) 10 MG tablet Take 1 tablet (10 mg total) by mouth daily.   Blood Glucose Monitoring Suppl (ONE TOUCH ULTRA 2) w/Device KIT 1 each by Does not apply route 4 (four) times daily. Use device to monitor glucose levels 4 times per day; E11.9   diclofenac Sodium (VOLTAREN) 1 % GEL APPLY 4 GRAMS TO AFFECTED JOINTS 4 TIMES A DAY   Dulaglutide (TRULICITY) 4.5 IW/9.7LG SOPN Inject 4.5 mg as directed once a week.   Famotidine (PEPCID PO) 1 tablet as needed.    gabapentin (NEURONTIN) 600 MG tablet Take 2 tablets (1,200 mg total) by mouth at bedtime.   hydrochlorothiazide (HYDRODIURIL) 25 MG tablet Take 1 tablet (25 mg total) by mouth daily.   Lancets (ONETOUCH DELICA PLUS [id number]) MISC Use to test blood sugars once daily   losartan (COZAAR) 100 MG tablet Take 1 tablet (100 mg total) by mouth daily.   multivitamin-lutein (OCUVITE-LUTEIN) CAPS capsule Take 1 capsule by mouth daily.   ONETOUCH ULTRA test strip TEST ONCE DAILY   No facility-administered encounter medications on file as of 01/22/2021.    Patient Active Problem List   Diagnosis Date Noted   Aortic atherosclerosis ([place]) 10/25/2020   Abdominal hernia 10/25/2020   BMI 35.0-35.9,adult 04/26/2020   GERD (gastroesophageal reflux disease) 04/26/2020   Vitamin D deficiency 04/25/2020   Arthralgia 10/26/2019   Dry cough 11/22/2017   Nocturnal leg cramps 02/02/2017  DOE (dyspnea on exertion) 08/04/2016   Obesity (BMI 30.0-34.9) 08/04/2016   Generalized osteoarthritis of hand 02/04/2016   Diabetic polyneuropathy associated with diabetes mellitus due to underlying condition ([place]) 11/27/2015   Hiatal hernia, large 08/02/2015   Colon polyps 08/02/2015   Iron deficiency 04/21/2015   Diabetes type 2, controlled ([place]) 05/03/2014   Hyperlipidemia, mixed  03/18/2014   Interstitial cystitis 07/08/2012   Essential hypertension 02/10/2011    Conditions to be addressed/monitored: Depression; Mental Health Concerns   Care Plan : LCSW Plan of Care  Updates made by Deirdre Peer, LCSW since 01/23/2021 12:00 AM     Problem: Financial stress impacting overall health/mental health   Priority: High     Goal: Provide pt with resources/support to aide in reducing stress/depression   Start Date: 12/18/2020  Expected End Date: 03/17/2021  This Visit's Progress: On track  Recent Progress: On track  Priority: High  Note:   Current barriers:   Patient in need of assistance with connecting to community resources for Financial constraints related to limited income, Mental Health Concerns , and Lacks knowledge of community resource: s Acknowledges deficits with meeting this unmet need Patient is unable to independently navigate community resource options without care coordination support Clinical Goals:  patient will work with SW to address concerns related to depression patient will work with Care Guide to address needs related to community resources to assist with financial strains/stress Clinical Interventions:  CSW spoke with pt today and advised her of confirmation she will not have a copay for our program/support.  Pt acknowledges financial strains and the effect it has on her overall health/mental health. CSW completed a PHQ9 Depression Screening with pt today- pt scored "11" indicating moderate and worse/higher score than last time.  She acknowledges feeling better in regards to her grief; stating, "It just took some time". She was able to enjoy her daughter's wedding last month as well as travel to a family reunion.  Pt reports having trouble sleeping due to pain- encouraged her to discuss this with her Providers. She is planning to ask her daughter and son-in- law who have moved in to pay some of the bills to help her with overall financial  strain. CSW validated her feeling empowered to ask them for help with the rent/bills.  Pt is interested in seeking a job also; for the financial, activity and social benefits it will offer. She is hoping to find something close by that she may even be able to walk to some days.  CSW will ask Care Guide to assist pt with possible agency help for seniors to assist with getting gutters cleaned out- advised pt there are some non-profits that may be able to assist.     Collaboration with Quay Burow, [name], MD regarding development and update of comprehensive plan of care as evidenced by provider attestation and co-signature Inter-disciplinary care team collaboration (see longitudinal plan of care) Assessment of needs, barriers , agencies contacted, as well as how impacting  Review various resources, discussed options and provided patient information about Financial constraints related to limited income Collaborated with appropriate clinical care team members regarding patient needs Financial constraints and Lacks knowledge of community resources, Depression Patient interviewed and appropriate assessments performed Referred patient to community resources care guide team for assistance with financial strains Provided mental health counseling with regard to depression  Other interventions provided: Depression screen reviewed , PHQ2/ PHQ9 completed, Solution-Focused Strategies, Active listening / Reflection utilized , Emotional Supportive Provided, Problem Solving /  [facility] [place] , Psychoeducation for mental health needs , Motivational Interviewing, and Discussed referral to [facility] to assist with connecting to mental health provider- pt to consider and call CSW if changes mind Patient Goals:  - -expect phone call from [facility] with resources to help   - begin a notebook of services in my neighborhood or community - call 211 when I need some help - follow-up on any referrals for help I am given - make a list  of family or friends that I can call   Follow Up Plan: Appointment scheduled for SW follow up with client by phone on:  02/19/21      Follow Up Plan: Appointment scheduled for SW follow up with client by phone on: 02/19/21      Eduard Clos MSW, LCSW Licensed Clinical Social Worker [place]

## 2021-01-27 ENCOUNTER — Telehealth: Payer: Self-pay | Admitting: *Deleted

## 2021-01-27 NOTE — Telephone Encounter (Signed)
   Telephone encounter was:  Successful.  01/27/2021 Name: Heather [name] MRN: [id number] DOB: 10-19-1950  Heather [name] is a 70 y.o. year old female who is a primary care patient of Burns, [name], MD . The community resource team was consulted for assistance with Patient does not want any assistance at this time as she is working and she wants to handle on her own said she would advise office if she wanted any further information   Care guide performed the following interventions: Patient provided with information about care guide support team and interviewed to confirm resource needs Follow up call placed to community resources to determine status of patients referral.  Follow Up Plan:  No further follow up planned at this time. The patient has been provided with needed resources.  [facility], Care Management  [phone] [address] , [place] [address] Email : [email or web address]

## 2021-02-01 IMAGING — DX DG HAND COMPLETE 3+V*R*
3 series · 3 of 3 positions shown · non-contrast
Comparison: None.

CLINICAL DATA: Chronic hand pain and weakness

EXAM:
RIGHT HAND - COMPLETE 3+ VIEW

[hand ap]
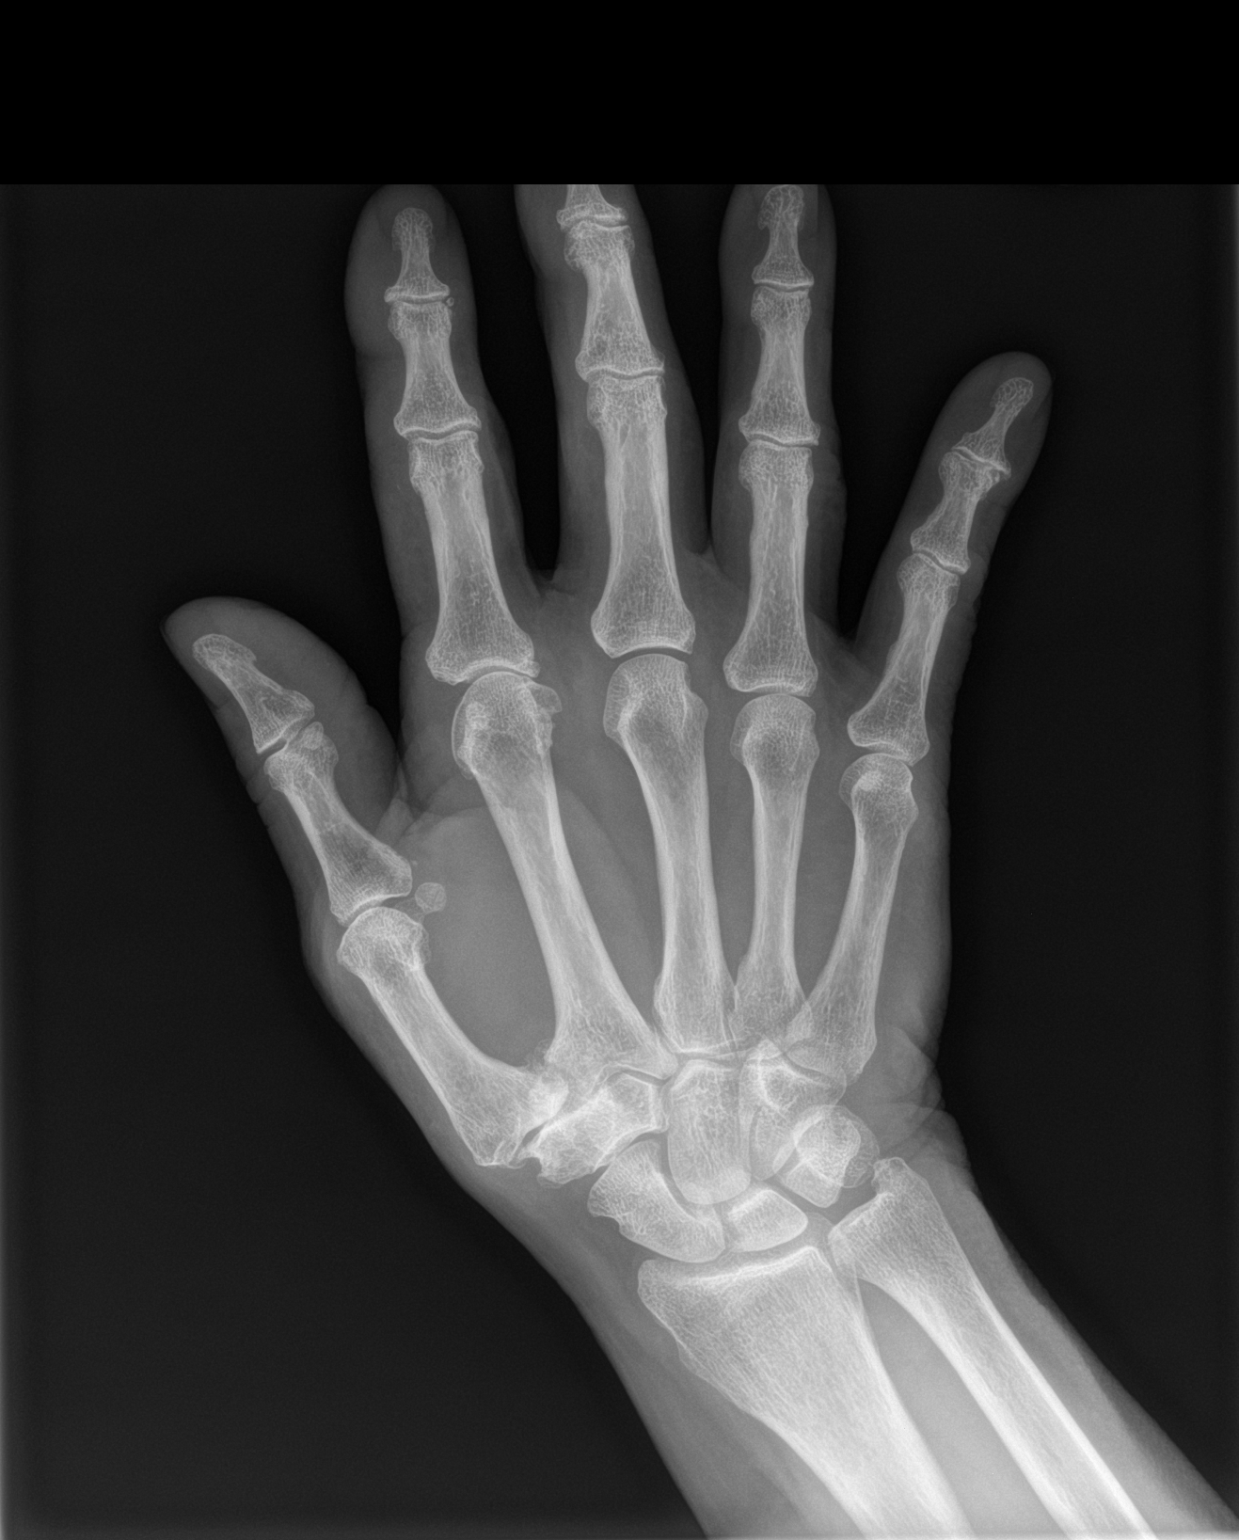

[hand obl]
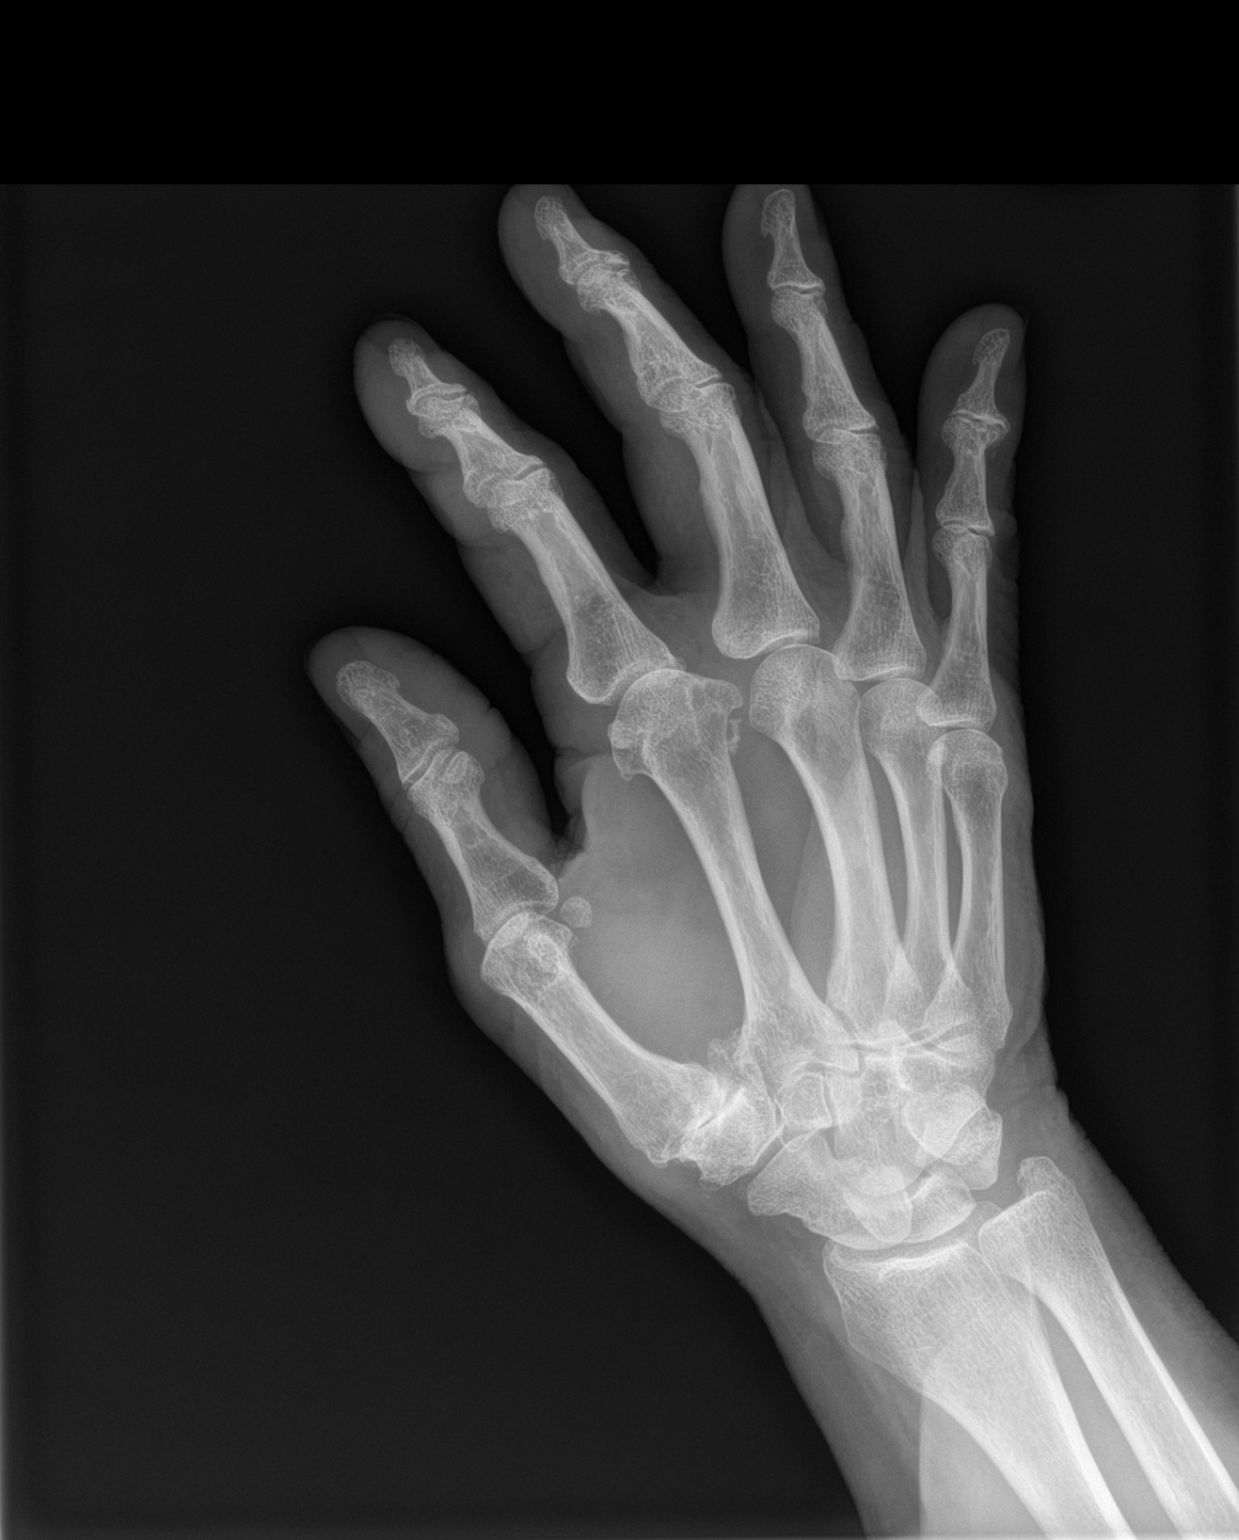

[hand lat]
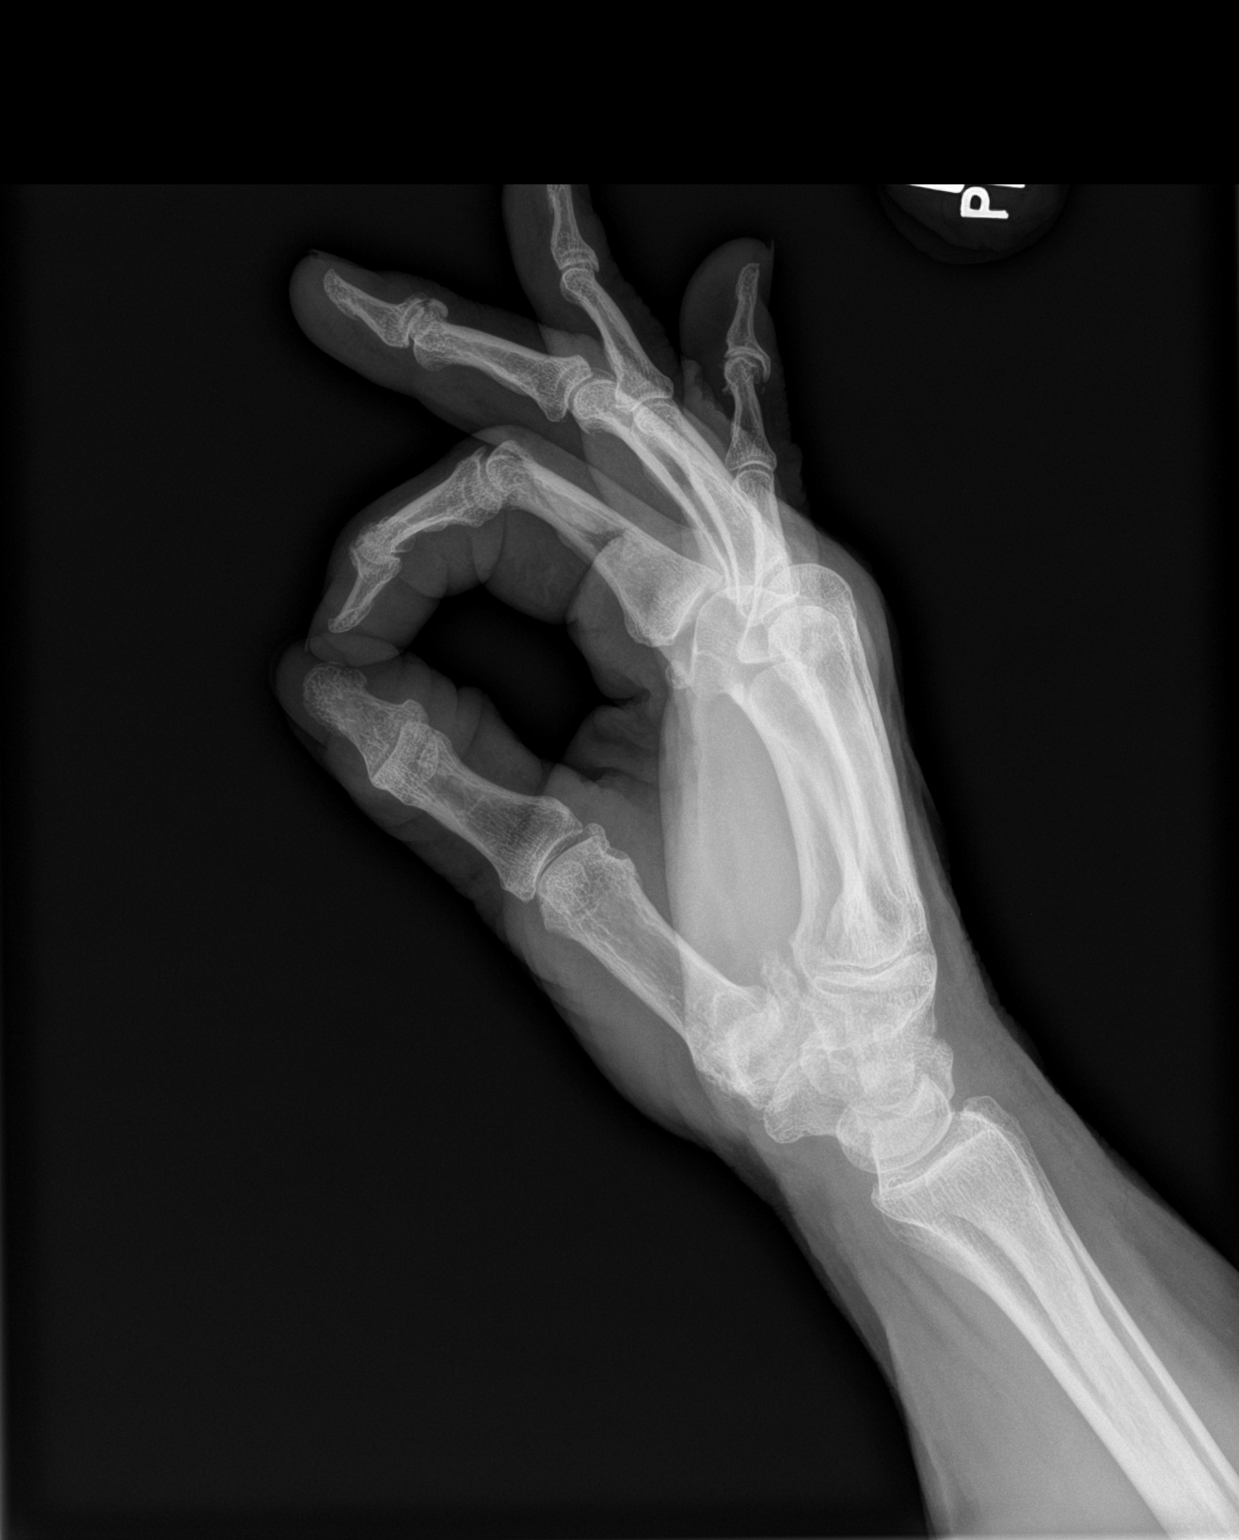

[3 of 3 positions shown; findings below may reference images not displayed]

FINDINGS: Three view radiograph right hand demonstrates normal alignment. No
fracture or dislocation. Bone mineral density is normal. No
erosions. There is moderate to severe degenerate arthritis at the
base of the thumb at the first carpometacarpal joint. Moderate
degenerative arthritis is also noted at the second MCP at the base
of the index finger. Mild osteoarthritis is also noted at the first
MCP of the thumb as well as the second, third, fourth, and fifth at
DIP joints.
IMPRESSION: Polyarticular osteoarthritis, most severe at the base of the thumb
at the 1st carpometacarpal joint.

## 2021-02-14 DIAGNOSIS — E1142 Type 2 diabetes mellitus with diabetic polyneuropathy: Secondary | ICD-10-CM | POA: Diagnosis not present

## 2021-02-19 ENCOUNTER — Ambulatory Visit (INDEPENDENT_AMBULATORY_CARE_PROVIDER_SITE_OTHER): Payer: Medicare Other | Admitting: *Deleted

## 2021-02-19 DIAGNOSIS — Z599 Problem related to housing and economic circumstances, unspecified: Secondary | ICD-10-CM

## 2021-02-19 DIAGNOSIS — E1142 Type 2 diabetes mellitus with diabetic polyneuropathy: Secondary | ICD-10-CM

## 2021-02-19 DIAGNOSIS — F439 Reaction to severe stress, unspecified: Secondary | ICD-10-CM

## 2021-02-19 NOTE — Chronic Care Management (AMB) (Signed)
Chronic Care Management    Clinical Social Work Note  02/19/2021 Name: Heather [name] MRN: [id number] DOB: 10-27-1950  Heather [name] is a 70 y.o. year old female who is a primary care patient of Burns, [name], MD. The CCM team was consulted to assist the patient with chronic disease management and/or care coordination needs related to: [employer]  and [place] and Resources.   Engaged with patient by telephone for follow up visit in response to provider referral for social work chronic care management and care coordination services.   Consent to Services:  The patient was given information about Chronic Care Management services, agreed to services, and gave verbal consent prior to initiation of services.  Please see initial visit note for detailed documentation.   Patient agreed to services and consent obtained.   Assessment: Review of patient past medical history, allergies, medications, and health status, including review of relevant consultants reports was performed today as part of a comprehensive evaluation and provision of chronic care management and care coordination services.     SDOH (Social Determinants of Health) assessments and interventions performed:    Advanced Directives Status: Not addressed in this encounter.  CCM Care Plan  Allergies  Allergen Reactions   Farxiga [Dapagliflozin]     Constant Vaginal Infections   Flagyl [Metronidazole] Other (See Comments)    Made really sick. Was able to tolerate dose 4/17 with zofran   Jardiance [Empagliflozin]     Constant Vaginal infections   Lisinopril     04/03/15 cough reported X several months   Cholestyramine Other (See Comments)    Joint pain   Prandin [Repaglinide] Nausea Only   Sulfonamide Derivatives     nausea    Outpatient Encounter Medications as of 02/19/2021  Medication Sig   atorvastatin (LIPITOR) 10 MG tablet Take 1 tablet (10 mg total) by mouth daily.   Blood Glucose  Monitoring Suppl (ONE TOUCH ULTRA 2) w/Device KIT 1 each by Does not apply route 4 (four) times daily. Use device to monitor glucose levels 4 times per day; E11.9   diclofenac Sodium (VOLTAREN) 1 % GEL APPLY 4 GRAMS TO AFFECTED JOINTS 4 TIMES A DAY   Dulaglutide (TRULICITY) 4.5 QM/5.7QI SOPN Inject 4.5 mg as directed once a week.   Famotidine (PEPCID PO) 1 tablet as needed.    gabapentin (NEURONTIN) 600 MG tablet Take 2 tablets (1,200 mg total) by mouth at bedtime.   hydrochlorothiazide (HYDRODIURIL) 25 MG tablet Take 1 tablet (25 mg total) by mouth daily.   Lancets (ONETOUCH DELICA PLUS [id number]) MISC Use to test blood sugars once daily   losartan (COZAAR) 100 MG tablet Take 1 tablet (100 mg total) by mouth daily.   multivitamin-lutein (OCUVITE-LUTEIN) CAPS capsule Take 1 capsule by mouth daily.   ONETOUCH ULTRA test strip TEST ONCE DAILY   No facility-administered encounter medications on file as of 02/19/2021.    Patient Active Problem List   Diagnosis Date Noted   Aortic atherosclerosis ([place]) 10/25/2020   Abdominal hernia 10/25/2020   BMI 35.0-35.9,adult 04/26/2020   GERD (gastroesophageal reflux disease) 04/26/2020   Vitamin D deficiency 04/25/2020   Arthralgia 10/26/2019   Dry cough 11/22/2017   Nocturnal leg cramps 02/02/2017   DOE (dyspnea on exertion) 08/04/2016   Obesity (BMI 30.0-34.9) 08/04/2016   Generalized osteoarthritis of hand 02/04/2016   Diabetic polyneuropathy associated with diabetes mellitus due to underlying condition ([place]) 11/27/2015   Hiatal hernia, large 08/02/2015   Colon polyps 08/02/2015  Iron deficiency 04/21/2015   Diabetes type 2, controlled ([name]) 05/03/2014   Hyperlipidemia, mixed 03/18/2014   Interstitial cystitis 07/08/2012   Essential hypertension 02/10/2011    Conditions to be addressed/monitored: Depression; Limited social support and Mental Health Concerns   Care Plan : LCSW Plan of Care  Updates made by Deirdre Peer, LCSW since  02/19/2021 12:00 AM     Problem: Financial stress impacting overall health/mental health   Priority: High     Goal: Provide pt with resources/support to aide in reducing stress/depression   Start Date: 12/18/2020  Expected End Date: 04/16/2021  This Visit's Progress: On track  Recent Progress: On track  Priority: High  Note:   Current barriers:   Patient in need of assistance with connecting to community resources for Financial constraints related to limited income, Mental Health Concerns , and Lacks knowledge of community resource: s Acknowledges deficits with meeting this unmet need Patient is unable to independently navigate community resource options without care coordination support Clinical Goals:  patient will work with SW to address concerns related to depression patient will work with Care Guide to address needs related to community resources to assist with financial strains/stress Clinical Interventions:  CSW spoke with pt today and advised her of the $25 copay for outpatient counseling support per her insurance. Pt does not feel she can afford to do this now. She has recently gotten additional support from her daughter with bill paying; sh she is living with pt (and her daughter's husband and pt's brother).  CSW provided pt with info on [facility] behavioral health line/support and provided her with the number to call which she plans to do.    Pt has also taken some positive steps with activity and improving her quality of life.  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  She is also looking into other avenues doe socialization/activities.  CSW commended pt for her motivation and positive steps.    Collaboration with Binnie Rail, MD regarding development and update of comprehensive plan of care as evidenced by provider attestation and co-signature Inter-disciplinary care team collaboration  (see longitudinal plan of care) Assessment of needs, barriers , agencies contacted, as well as how impacting  Review various resources, discussed options and provided patient information about Financial constraints related to limited income Collaborated with appropriate clinical care team members regarding patient needs Financial constraints and Lacks knowledge of community resources, Depression Patient interviewed and appropriate assessments performed Referred patient to community resources care guide team for assistance with financial strains Provided mental health counseling with regard to depression  Other interventions provided: Depression screen reviewed , PHQ2/ PHQ9 completed, Solution-Focused Strategies, Active listening / Reflection utilized , Emotional Supportive Provided, Problem Solving [place] , Psychoeducation for mental health needs , Motivational Interviewing, and Discussed referral to [facility] to assist with connecting to mental health provider- pt to consider and call CSW if changes mind Patient Goals:  -consider opportunities for socialization; work, senior centers, Social research officer, government - begin a notebook of services in my neighborhood or community - call 211 when I need some help - follow-up on any referrals for help I am given - make a list of family or friends that I can call   Follow Up Plan: Appointment scheduled for SW follow up with client by phone on:  03/24/21      Follow Up Plan: Appointment scheduled  for SW follow up with client by phone on: 03/24/21      Eduard Clos MSW, [place] Licensed Clinical Social Worker [place] [phone]

## 2021-02-19 NOTE — Patient Instructions (Signed)
Visit Information  PATIENT GOALS:  Goals Addressed   None     The patient verbalized understanding of instructions, educational materials, and care plan provided today and declined offer to receive copy of patient instructions, educational materials, and care plan.   Telephone follow up appointment with care management team member scheduled for:03/24/21  Eduard Clos MSW, LCSW Licensed Clinical Social Worker [place] [phone]

## 2021-02-24 ENCOUNTER — Other Ambulatory Visit: Payer: Self-pay

## 2021-02-24 ENCOUNTER — Ambulatory Visit: Payer: Medicare Other | Admitting: Pharmacist

## 2021-02-24 DIAGNOSIS — G4762 Sleep related leg cramps: Secondary | ICD-10-CM

## 2021-02-24 DIAGNOSIS — E1142 Type 2 diabetes mellitus with diabetic polyneuropathy: Secondary | ICD-10-CM

## 2021-02-24 DIAGNOSIS — E782 Mixed hyperlipidemia: Secondary | ICD-10-CM

## 2021-02-24 DIAGNOSIS — I10 Essential (primary) hypertension: Secondary | ICD-10-CM

## 2021-02-24 DIAGNOSIS — E0842 Diabetes mellitus due to underlying condition with diabetic polyneuropathy: Secondary | ICD-10-CM

## 2021-02-24 NOTE — Patient Instructions (Signed)
Visit Information  Phone number for Pharmacist: [phone]   Goals Addressed             This Visit's Progress    Manage My Medicine       Timeframe:  Long-Range Goal Priority:  High Start Date:     11/25/20                        Expected End Date:   02/24/22             Follow Up Date Jan 2023   - call for medicine refill 2 or 3 days before it runs out - call if I am sick and can't take my medicine - keep a list of all the medicines I take; vitamins and herbals too - use a pillbox to sort medicine    Why is this important?   These steps will help you keep on track with your medicines.   Notes:         Care Plan : [place]  Updates made by Charlton Haws, RPH since 02/24/2021 12:00 AM     Problem: Hypertension, Hyperlipidemia and Diabetes, Leg cramps   Priority: High     Long-Range Goal: Disease mangement   Start Date: 08/16/2020  Expected End Date: 02/24/2022  This Visit's Progress: On track  Recent Progress: On track  Priority: High  Note:   Current Barriers:  Unable to independently monitor therapeutic efficacy Unable to maintain control of diabetes  Pharmacist Clinical Goal(s):  Patient will achieve adherence to monitoring guidelines and medication adherence to achieve therapeutic efficacy maintain control of Diabetes as evidenced by A1c  through collaboration with PharmD and provider.   Interventions: 1:1 collaboration with Binnie Rail, MD regarding development and update of comprehensive plan of care as evidenced by provider attestation and co-signature Inter-disciplinary care team collaboration (see longitudinal plan of care) Comprehensive medication review performed; medication list updated in electronic medical record  Hypertension    BP goal < 130/80 Patient checks BP at home 1-2x per week Patient home BP readings are ranging: 130/80s  Patient has failed these meds in the past: n/a Patient is currently controlled on  the following medications:  Losartan 100 mg daily HCTZ 25 mg daily   We discussed: BP is at goal; she reports she has lost 40 lbs in the [facility] program w/ [facility], but the program is ending soon and pt is worried about losing the support of the program; she is also walking less currently due to leg pain (see below)   Plan: Continue current medications and control with diet and exercise     Diabetes (Dx 2015)    A1c goal < 8% Checking BG: Daily Recent FBG Readings: 130-150 No low BG since stopping repaglinide   Patient has failed these meds in past: Farxiga, Jardiance (mycotic infections), metformin (short gut), nateglinide, Januvia, actos (edema), repaglinide (vomiting) Patient is currently uncontrolled on the following medications:  Trulicity 4.5 mg once weekly (Wednesday) - $9.80 x 3 months (LIS)   We discussed: Pt endorses compliance with Trulicity; J0D is most recently at goal;  -pt reports lifestyle modifications ([facility] program w/ [facility]) have been helping: Pt reports losing 40 lbs in the program. 11,000 steps per day, eating high-protein yogurt, sugar-free jello, trying hard to reduce carbs.   Plan: Continue current medications and control with diet and exercise    Hyperlipidemia    LDL goal < 70 Aortic atherosclerosis (  Xray 2019)  Patient has failed these meds in past: n/a Patient is currently uncontrolled on the following medications:  Atorvastatin 10 mg daily     We discussed:  her LDL was increased most recently (105) when previously it had been at goal; stressed compliance with atorvastatin; can consider changing to rosuvastatin for higher potency in the future  Plan: Continue current medications and control with diet and exercise   Pain / Cramps   Hx nocturnal leg cramps, diabetic neuropathy, osteoarthritis  Patient has failed these meds in past: duloxetine (pt never started) Patient is currently uncontrolled on the following medications:  Gabapentin 600 mg - 2 tab  HS Voltaren gel  We discussed:  Pt reports trouble sleeping due to body aches/cramps; pain is in hip and down leg; pain worsened significantly after a fall in August followed by a long car trip; she reports she was never evaluated after the fall but does not seem to be healing well -TENS unit helps; she declines sports medicine referral because she can't afford the $30 copay; she does not want to take an antidepressant like duloxetine due to what she has read about "medications like that" -pt would like gabapentin dose to be increased because she is having trouble sleeping due to the pain; she does not think sleep troubles are related to anything else (ie, stress/anxiety);  -discuss electrolyte abnormalities and dehydration can contribute to cramping; advised to continue pedialyte and ensure adequate hydration  Plan: Scheduled PCP visit to address lingering pain s/p fall; recommend tylenol in meantime   Patient Goals/Self-Care Activities Patient will:  - take medications as prescribed -focus on medication adherence by pill box -check glucose daily -target a minimum of 150 minutes of moderate intensity exercise weekly -engage in dietary modifications by reducing carbs      Patient verbalizes understanding of instructions provided today and agrees to view in [place].  Telephone follow up appointment with pharmacy team member scheduled for: 3 months  Charlene Brooke, PharmD, [place], CPP Clinical Pharmacist [facility] [phone]

## 2021-02-24 NOTE — Progress Notes (Signed)
Chronic Care Management Pharmacy Note  02/24/2021 Name:  Heather [name] MRN:  [id number] DOB:  08/31/1950  Summary: -Pt reports "bad fall" in August that she did not seek medical attention for, but pain is lingering still; she reports chronic leg cramps are worse and she has trouble sleeping; she requests an increase in her gabapentin dose (currently 1200 mg HS) -Pt declined sports medicine referral due to inability to afford $30 copay  Recommendations/Changes made from today's visit: -Scheduled PCP visit for lingering pain s/p fall 2 months ago -Recommend BMP to rule out electrolyte abnormalities contributing to leg cramps   Subjective: Heather [name] is an 70 y.o. year old female who is a primary patient of Burns, [name], MD.  The CCM team was consulted for assistance with disease management and care coordination needs.    Engaged with patient by telephone for follow up visit in response to provider referral for pharmacy case management and/or care coordination services.   Consent to Services:  The patient was given information about Chronic Care Management services, agreed to services, and gave verbal consent prior to initiation of services.  Please see initial visit note for detailed documentation.   Patient Care Team: Binnie Rail, MD as PCP - General (Internal Medicine) Richmond Campbell, MD as Consulting Physician (Gastroenterology) Renato Shin, MD as Consulting Physician (Endocrinology) Charlton Haws, [facility] as Pharmacist (Pharmacist) Deirdre Peer, LCSW as Social Worker (Licensed Clinical Social Worker)  Recent office visits: 10/25/20 Dr Quay Burow OV: chronic f/u; newly identified aortic atherosclerosis; pt will stop seeing Dr Loanne Drilling and PCP will manage DM. A1c improved to 7.0.  04/26/20 Dr Quay Burow OV: chronic f/u. Try slow-release iron 2-3x per week w/ stool softener.  Recent consult visits: Following with CCM LCSW and care guide for financial  difficulties/emotional support.  08/01/20 Dr Loanne Drilling (endocrine): A1c increase to 7.7. Added repaglinide 0.5 mg BID w/meals. No metformin d/t short bowel syndrome.  Hospital visits: None in previous 6 months  Objective:  Lab Results  Component Value Date   CREATININE 0.84 10/25/2020   BUN 12 10/25/2020   GFR 69.60 10/25/2020   GFRNONAA >60 08/21/2015   GFRAA >60 08/21/2015   NA 137 10/25/2020   K 4.1 10/25/2020   CALCIUM 10.0 10/25/2020   CO2 30 10/25/2020   GLUCOSE 114 (H) 10/25/2020    Lab Results  Component Value Date/Time   HGBA1C 7.0 (H) 10/25/2020 08:32 AM   HGBA1C 7.7 (H) 08/05/2020 09:23 AM   HGBA1C 6.7 04/21/2018 08:04 AM   FRUCTOSAMINE 282 04/26/2020 08:49 AM   GFR 69.60 10/25/2020 08:32 AM   GFR 58.77 (L) 04/26/2020 08:47 AM   MICROALBUR 3.5 (H) 02/04/2016 10:23 AM   MICROALBUR 0.8 11/30/2014 03:56 PM    Last diabetic Eye exam:  Lab Results  Component Value Date/Time   HMDIABEYEEXA No Retinopathy 11/29/2020 12:00 AM    Last diabetic Foot exam: No results found for: HMDIABFOOTEX   Lab Results  Component Value Date   CHOL 186 10/25/2020   HDL 55.30 10/25/2020   LDLCALC 105 (H) 10/25/2020   LDLDIRECT 108.0 04/27/2019   TRIG 128.0 10/25/2020   CHOLHDL 3 10/25/2020    Hepatic Function Latest Ref Rng & Units 10/25/2020 04/26/2020 10/26/2019  Total Protein 6.0 - 8.3 g/dL 7.3 7.4 7.0  Albumin 3.5 - 5.2 g/dL 4.4 4.2 4.2  AST 0 - 37 U/L '17 17 20  ' ALT 0 - 35 U/L '17 18 27  ' Alk Phosphatase 39 - 117 U/L  56 57 59  Total Bilirubin 0.2 - 1.2 mg/dL 1.3(H) 1.3(H) 1.2  Bilirubin, Direct 0.0 - 0.3 mg/dL - - -    Lab Results  Component Value Date/Time   TSH 1.14 03/13/2020 09:20 AM   TSH 1.95 11/25/2018 08:49 AM    CBC Latest Ref Rng & Units 04/26/2020 10/26/2019 04/27/2019  WBC 4.0 - 10.5 K/uL 6.2 5.8 7.8  Hemoglobin 12.0 - 15.0 g/dL 12.9 12.3 11.8(L)  Hematocrit 36.0 - 46.0 % 37.6 36.3 36.3  Platelets 150.0 - 400.0 K/uL 381.0 307.0 381.0    Iron/TIBC/Ferritin/ %Sat    Component Value Date/Time   IRON 68 04/26/2020 0847   TIBC 399 04/26/2020 0847   FERRITIN 32 04/26/2020 0847   IRONPCTSAT 17 04/26/2020 0847    Lab Results  Component Value Date/Time   VD25OH 42.36 04/26/2020 08:47 AM   VD25OH 27.97 (L) 11/30/2014 03:56 PM   Clinical ASCVD: No  The 10-year ASCVD risk score (Arnett DK, et al., 2019) is: 24.6%   Values used to calculate the score:     Age: 21 years     Sex: Female     Is Non-Hispanic African American: No     Diabetic: Yes     Tobacco smoker: No     Systolic Blood Pressure: 353 mmHg     Is BP treated: Yes     HDL Cholesterol: 55.3 mg/dL     Total Cholesterol: 186 mg/dL    Depression screen [facility] 2/9 01/22/2021 06/26/2020 04/26/2020  Decreased Interest 1 0 0  Down, Depressed, Hopeless 2 1 0  PHQ - 2 Score 3 1 0  Altered sleeping 3 - -  Tired, decreased energy 2 - -  Change in appetite 1 - -  Feeling bad or failure about yourself  1 - -  Trouble concentrating 1 - -  Moving slowly or fidgety/restless 0 - -  Suicidal thoughts 0 - -  PHQ-9 Score 11 - -  Difficult doing work/chores Not difficult at all - -     BP Readings from Last 3 Encounters:  10/25/20 120/78  04/26/20 122/76  01/09/20 120/70   Pulse Readings from Last 3 Encounters:  10/25/20 65  04/26/20 80  04/25/20 76   Wt Readings from Last 3 Encounters:  10/25/20 172 lb (78 kg)  04/26/20 184 lb 3.2 oz (83.6 kg)  04/25/20 189 lb (85.7 kg)   BMI Readings from Last 3 Encounters:  10/25/20 33.59 kg/m  04/26/20 35.97 kg/m  04/25/20 36.91 kg/m    Assessment/Interventions: Review of patient past medical history, allergies, medications, health status, including review of consultants reports, laboratory and other test data, was performed as part of comprehensive evaluation and provision of chronic care management services.   SDOH:  (Social Determinants of Health) assessments and interventions performed: Yes  SDOH Screenings    Alcohol Screen: Low Risk    Last Alcohol Screening Score (AUDIT): 0  Depression (PHQ2-9): Medium Risk   PHQ-2 Score: 11  Financial Resource Strain: High Risk   Difficulty of Paying Living Expenses: Hard  Food Insecurity: Food Insecurity Present   Worried About Charity fundraiser in the Last Year: Often true   Ran Out of Food in the Last Year: Sometimes true  Housing: Low Risk    Last Housing Risk Score: 0  Physical Activity: Inactive   Days of Exercise per Week: 0 days   Minutes of Exercise per Session: 0 min  Social Connections: Not on file  Stress: Stress Concern Present  Feeling of Stress : Very much  Tobacco Use: Medium Risk   Smoking Tobacco Use: Former   Smokeless Tobacco Use: Never  Transportation Needs: No Data processing manager (Medical): No   Lack of Transportation (Non-Medical): No    CCM Care Plan  Allergies  Allergen Reactions   Farxiga [Dapagliflozin]     Constant Vaginal Infections   Flagyl [Metronidazole] Other (See Comments)    Made really sick. Was able to tolerate dose 4/17 with zofran   Jardiance [Empagliflozin]     Constant Vaginal infections   Lisinopril     04/03/15 cough reported X several months   Cholestyramine Other (See Comments)    Joint pain   Prandin [Repaglinide] Nausea Only   Sulfonamide Derivatives     nausea    Medications Reviewed Today     Reviewed by Charlton Haws, [facility] (Pharmacist) on 02/24/21 at 1413  Med List Status: <None>   Medication Order Taking? Sig Documenting Provider Last Dose Status Informant  atorvastatin (LIPITOR) 10 MG tablet [id number] Yes Take 1 tablet (10 mg total) by mouth daily. Binnie Rail, MD Taking Active   Blood Glucose Monitoring Suppl (ONE TOUCH ULTRA 2) w/Device KIT [id number] Yes 1 each by Does not apply route 4 (four) times daily. Use device to monitor glucose levels 4 times per day; E11.9 Renato Shin, MD Taking Active   diclofenac Sodium (VOLTAREN) 1 % GEL  [id number] Yes APPLY 4 GRAMS TO AFFECTED JOINTS 4 TIMES A DAY Gregor Hams, MD Taking Active   Dulaglutide (TRULICITY) 4.5 NT/6.1WE SOPN [id number] Yes Inject 4.5 mg as directed once a week. Binnie Rail, MD Taking Active   Famotidine (PEPCID PO) [id number] Yes 1 tablet as needed.  [provider] Taking Active   gabapentin (NEURONTIN) 600 MG tablet [id number] Yes Take 2 tablets (1,200 mg total) by mouth at bedtime. Binnie Rail, MD Taking Active   hydrochlorothiazide (HYDRODIURIL) 25 MG tablet [id number] Yes Take 1 tablet (25 mg total) by mouth daily. Binnie Rail, MD Taking Active   Lancets (ONETOUCH DELICA PLUS [id number]) [place] [id number] Yes Use to test blood sugars once daily Renato Shin, MD Taking Active   losartan (COZAAR) 100 MG tablet [id number] Yes Take 1 tablet (100 mg total) by mouth daily. Binnie Rail, MD Taking Active   multivitamin-lutein [facility]) CAPS capsule [id number] Yes Take 1 capsule by mouth daily. [provider] Taking Active   Donald Siva test strip [id number] Yes TEST ONCE DAILY Renato Shin, MD Taking Active             Patient Active Problem List   Diagnosis Date Noted   Aortic atherosclerosis ([place]) 10/25/2020   Abdominal hernia 10/25/2020   BMI 35.0-35.9,adult 04/26/2020   GERD (gastroesophageal reflux disease) 04/26/2020   Vitamin D deficiency 04/25/2020   Arthralgia 10/26/2019   Dry cough 11/22/2017   Nocturnal leg cramps 02/02/2017   DOE (dyspnea on exertion) 08/04/2016   Obesity (BMI 30.0-34.9) 08/04/2016   Generalized osteoarthritis of hand 02/04/2016   Diabetic polyneuropathy associated with diabetes mellitus due to underlying condition ([place]) 11/27/2015   Hiatal hernia, large 08/02/2015   Colon polyps 08/02/2015   Iron deficiency 04/21/2015   Diabetes type 2, controlled ([place]) 05/03/2014   Hyperlipidemia, mixed 03/18/2014   Interstitial cystitis 07/08/2012   Essential hypertension 02/10/2011    Immunization  History  Administered Date(s) Administered   Hepatitis A 11/14/2003   Influenza Whole 01/17/2012   Influenza, High Dose Seasonal PF  02/20/2014, 01/15/2018, 12/26/2018, 01/15/2020, 01/17/2021   Influenza-Unspecified 02/24/2013, 02/19/2015, 01/24/2016, 01/07/2017   PFIZER Comirnaty(Gray Top)Covid-19 Tri-Sucrose Vaccine 08/21/2020   PFIZER(Purple Top)SARS-COV-2 Vaccination 06/06/2019, 06/24/2019, 02/13/2020   Pfizer Covid-19 Vaccine Bivalent Booster 20yr & up 01/23/2021   Pneumococcal Conjugate-13 03/07/2014   Pneumococcal Polysaccharide-23 08/02/2015   Td 06/11/2006   Tdap 02/04/2016   Zoster Recombinat (Shingrix) 09/08/2016, 03/08/2017   Zoster, Live 03/21/2014    Conditions to be addressed/monitored:  Hypertension, Hyperlipidemia and Diabetes, Aortic atherosclerosis  Care Plan : [place] Updates made by FCharlton Haws RAchillesince 02/24/2021 12:00 AM     Problem: Hypertension, Hyperlipidemia and Diabetes, Leg cramps   Priority: High     Long-Range Goal: Disease mangement   Start Date: 08/16/2020  Expected End Date: 02/24/2022  This Visit's Progress: On track  Recent Progress: On track  Priority: High  Note:   Current Barriers:  Unable to independently monitor therapeutic efficacy Unable to maintain control of diabetes  Pharmacist Clinical Goal(s):  Patient will achieve adherence to monitoring guidelines and medication adherence to achieve therapeutic efficacy maintain control of Diabetes as evidenced by A1c  through collaboration with PharmD and provider.   Interventions: 1:1 collaboration with BBinnie Rail MD regarding development and update of comprehensive plan of care as evidenced by provider attestation and co-signature Inter-disciplinary care team collaboration (see longitudinal plan of care) Comprehensive medication review performed; medication list updated in electronic medical record  Hypertension    BP goal < 130/80 Patient checks BP  at home 1-2x per week Patient home BP readings are ranging: 130/80s  Patient has failed these meds in the past: n/a Patient is currently controlled on the following medications:  Losartan 100 mg daily HCTZ 25 mg daily   We discussed: BP is at goal; she reports [facility] but the program is ending soon and pt is worried about losing the support of the program; she is also walking less currently due to leg pain (see below)   Plan: Continue current medications and control with diet and exercise     Diabetes (Dx 2015)    A1c goal < 8% Checking BG: Daily Recent FBG Readings: 130-150 No low BG since stopping repaglinide   Patient has failed these meds in past: Farxiga, Jardiance (mycotic infections), metformin (short gut), nateglinide, Januvia, actos (edema), repaglinide (vomiting) Patient is currently uncontrolled on the following medications:  Trulicity 4.5 mg once weekly (Wednesday) - $9.80 x 3 months (LIS)   We discussed: Pt endorses compliance with Trulicity; AY1Eis most recently at goal;  -pt reports lifestyle modifications ([facility]: Pt reports losing 40 lbs in the program. 11,000 steps per day, eating high-protein yogurt, sugar-free jello, trying hard to reduce carbs.   Plan: Continue current medications and control with diet and exercise    Hyperlipidemia    LDL goal < 70 Aortic atherosclerosis (Xray 2019)  Patient has failed these meds in past: n/a Patient is currently uncontrolled on the following medications:  Atorvastatin 10 mg daily     We discussed:  her LDL was increased most recently (105) when previously it had been at goal; stressed compliance with atorvastatin; can consider changing to rosuvastatin for higher potency in the future  Plan: Continue current medications and control with diet and exercise   Pain / Cramps   Hx nocturnal leg cramps, diabetic neuropathy, osteoarthritis  Patient  has failed these meds in  past: duloxetine (pt never started) Patient is currently uncontrolled on the following medications:  Gabapentin 600 mg - 2 tab HS Voltaren gel  We discussed:  Pt reports trouble sleeping due to body aches/cramps; pain is in hip and down leg; pain worsened significantly after a fall in August followed by a long car trip; she reports she was never evaluated after the fall but does not seem to be healing well -TENS unit helps; she declines sports medicine referral because she can't afford the $30 copay; she does not want to take an antidepressant like duloxetine due to what she has read about "medications like that" -pt would like gabapentin dose to be increased because she is having trouble sleeping due to the pain; she does not think sleep troubles are related to anything else (ie, stress/anxiety);  -discuss electrolyte abnormalities and dehydration can contribute to cramping; advised to continue pedialyte and ensure adequate hydration  Plan: Scheduled PCP visit to address lingering pain s/p fall; recommend tylenol in meantime   Patient Goals/Self-Care Activities Patient will:  - take medications as prescribed -focus on medication adherence by pill box -check glucose daily -target a minimum of 150 minutes of moderate intensity exercise weekly -engage in dietary modifications by reducing carbs      Medication Assistance: None required.  Patient affirms current coverage meets needs. - LIS  Compliance/Adherence/Medication fill history: Care Gaps: DEXA scan (02/03/21)  Star-Rating Drugs: Atorvastatin - LF 02/09/21  x 90 ds Losartan - LF 7/62/83 x 90 ds Trulicity - LF 1/51/76 x 84 ds  Patient's preferred pharmacy is:  [id number] [place], [place] - [address]Phone: [phone]: [phone] [facility]  ([place] [facility] [place]Phone: [phone]: [phone] Uses pill box? Yes Pt endorses 100% compliance  We discussed: Current pharmacy is preferred with insurance plan and patient is satisfied with pharmacy services Patient decided to: Continue current medication management strategy  Care Plan and Follow Up Patient Decision:  Patient agrees to Care Plan and Follow-up.  Plan: Telephone follow up appointment with care management team member scheduled for:  3 months  LCharlene Brooke PharmD, BO[place] CPP Clinical Pharmacist [facility]Bobtown[facility] at [phone]

## 2021-03-02 ENCOUNTER — Encounter: Payer: Self-pay | Admitting: Internal Medicine

## 2021-03-02 NOTE — Progress Notes (Signed)
Subjective:    Patient ID: Heather [name], female    DOB: Jul 05, 1950, 70 y.o.   MRN: [id number]  This visit occurred during the SARS-CoV-2 public health emergency.  Safety protocols were in place, including screening questions prior to the visit, additional usage of staff PPE, and extensive cleaning of exam room while observing appropriate contact time as indicated for disinfecting solutions.    HPI The patient is here for an acute visit.  Leg pain -  she fell 2 months ago  - it was a bad fall -- she fell coming off a step ladder and landed on her right buttock/hip region.  She is still having pain there.  The pain is intermittent.  Her entire right side is sore.    She is taking tylenol and gabapentin HS 1200 mg.  She has pain in her right lower pain and buttock region, the entire right leg and right shoulder.  She uses her heating pad.    Her peripheral neuropathy has gotten worse over time and she is not sleeping at night.  She has decreased sensation in her feet and 1/3 way up her legs.  She wonders about inc the gabapentin.  Her symptoms are ok during the day.      Medications and allergies reviewed with patient and updated if appropriate.  Patient Active Problem List   Diagnosis Date Noted   Aortic atherosclerosis ([place]) 10/25/2020   Abdominal hernia 10/25/2020   BMI 35.0-35.9,adult 04/26/2020   GERD (gastroesophageal reflux disease) 04/26/2020   Vitamin D deficiency 04/25/2020   Arthralgia 10/26/2019   Dry cough 11/22/2017   Nocturnal leg cramps 02/02/2017   DOE (dyspnea on exertion) 08/04/2016   Obesity (BMI 30.0-34.9) 08/04/2016   Generalized osteoarthritis of hand 02/04/2016   Diabetic polyneuropathy associated with diabetes mellitus due to underlying condition ([place]) 11/27/2015   Hiatal hernia, large 08/02/2015   Colon polyps 08/02/2015   Iron deficiency 04/21/2015   Diabetes type 2, controlled ([place]) 05/03/2014   Hyperlipidemia, mixed 03/18/2014   Interstitial  cystitis 07/08/2012   Essential hypertension 02/10/2011    Current Outpatient Medications on File Prior to Visit  Medication Sig Dispense Refill   atorvastatin (LIPITOR) 10 MG tablet Take 1 tablet (10 mg total) by mouth daily. 90 tablet 1   Blood Glucose Monitoring Suppl (ONE TOUCH ULTRA 2) w/Device KIT 1 each by Does not apply route 4 (four) times daily. Use device to monitor glucose levels 4 times per day; E11.9 1 kit 0   diclofenac Sodium (VOLTAREN) 1 % GEL APPLY 4 GRAMS TO AFFECTED JOINTS 4 TIMES A DAY 400 g 11   Dulaglutide (TRULICITY) 4.5 VE/9.3YB SOPN Inject 4.5 mg as directed once a week. 12 mL 3   Famotidine (PEPCID PO) 1 tablet as needed.      gabapentin (NEURONTIN) 600 MG tablet Take 2 tablets (1,200 mg total) by mouth at bedtime. 180 tablet 1   hydrochlorothiazide (HYDRODIURIL) 25 MG tablet Take 1 tablet (25 mg total) by mouth daily. 90 tablet 1   Lancets (ONETOUCH DELICA PLUS [id number]) MISC Use to test blood sugars once daily 100 each 3   losartan (COZAAR) 100 MG tablet Take 1 tablet (100 mg total) by mouth daily. 90 tablet 1   multivitamin-lutein (OCUVITE-LUTEIN) CAPS capsule Take 1 capsule by mouth daily.     ONETOUCH ULTRA test strip TEST ONCE DAILY 100 strip 3   No current facility-administered medications on file prior to visit.    Past Medical History:  Diagnosis Date   Anemia    Colon polyps    Diabetes mellitus without complication (HCC)    Environmental allergies    GERD (gastroesophageal reflux disease)    Heart murmur    History of DVT (deep vein thrombosis) 1970'S   History of hiatal hernia    History of transfusion    Hyperlipidemia    Hypertension    Interstitial cystitis    Dr Janice Norrie   Shortness of breath dyspnea    "due to low hemaglobin"    Past Surgical History:  Procedure Laterality Date   APPENDECTOMY     BILATERAL SALPINGOOPHORECTOMY     painful cysts   CATARACT EXTRACTION, BILATERAL     COLONOSCOPY  2005   negative; Dr Earlean Shawl. F/U  declined   CYSTOSCOPY     X 4; Dr Janice Norrie   LAPAROSCOPIC RIGHT COLECTOMY Right 08/19/2015   Procedure: LAPAROSCOPIC ASSISTED  RIGHT COLECTOMY;  Surgeon: Alphonsa Overall, MD;  Location: [facility];  Service: General;  Laterality: Right;   PREMAGLINANT POLYPS     TOTAL ABDOMINAL HYSTERECTOMY     metromenorrhagia    Social History   Socioeconomic History   Marital status: Widowed    Spouse name: Not on file   Number of children: 2   Years of education: Not on file   Highest education level: Not on file  Occupational History   Not on file  Tobacco Use   Smoking status: Former    Types: Cigarettes    Quit date: 05/18/1984    Years since quitting: 36.8   Smokeless tobacco: Never   Tobacco comments:    smoked 1972-1986, up to 1 ppd  Vaping Use   Vaping Use: Never used  Substance and Sexual Activity   Alcohol use: No    Alcohol/week: 0.0 standard drinks   Drug use: No   Sexual activity: Never  Other Topics Concern   Not on file  Social History Narrative   Lives with brother, daughter and 2 grandchildren in a 3 story home.  Has 2 children.  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     Highest level of education:  2 years of graduate school   Social Determinants of Health   Financial Resource Strain: High Risk   Difficulty of Paying Living Expenses: Hard  Food Insecurity: Food Insecurity Present   Worried About Charity fundraiser in the Last Year: Often true   Ran Out of Food in the Last Year: Sometimes true  Transportation Needs: No Transportation Needs   Lack of Transportation (Medical): No   Lack of Transportation (Non-Medical): No  Physical Activity: Inactive   Days of Exercise per Week: 0 days   Minutes of Exercise per Session: 0 min  Stress: Stress Concern Present   Feeling of Stress : Very much  Social Connections: Not on file    Family History  Problem Relation Age of Onset   Heart attack Mother        in 68s    Hypertension Mother    Stroke Mother         in 59s   Other Father        Deceased, 81   Stroke Maternal Grandfather 23   Heart attack Paternal Grandfather        in 57s   Breast cancer Sister    Healthy Brother    Healthy Son    Healthy Daughter  Diabetes Neg Hx     Review of Systems     Objective:   Vitals:   03/03/21 0844  BP: (!) 130/92  Pulse: 71  Temp: 98.5 F (36.9 C)  SpO2: 98%   BP Readings from Last 3 Encounters:  03/03/21 (!) 130/92  10/25/20 120/78  04/26/20 122/76   Wt Readings from Last 3 Encounters:  03/03/21 164 lb (74.4 kg)  10/25/20 172 lb (78 kg)  04/26/20 184 lb 3.2 oz (83.6 kg)   Body mass index is 32.03 kg/m.   Physical Exam Constitutional:      General: She is not in acute distress.    Appearance: Normal appearance. She is not ill-appearing.  HENT:     Head: Normocephalic and atraumatic.  Musculoskeletal:        General: Tenderness (right low-mid back in muscle region. no L-spine ro T-spine pain with palpation) present. No swelling or deformity.     Right lower leg: No edema.     Left lower leg: No edema.  Skin:    General: Skin is warm and dry.  Neurological:     Mental Status: She is alert.     Sensory: Sensory deficit (decreased bl lower legs) present.           Assessment & Plan:    See Problem List for Assessment and Plan of chronic medical problems.

## 2021-03-03 ENCOUNTER — Other Ambulatory Visit: Payer: Self-pay

## 2021-03-03 ENCOUNTER — Ambulatory Visit (INDEPENDENT_AMBULATORY_CARE_PROVIDER_SITE_OTHER): Payer: Medicare Other | Admitting: Internal Medicine

## 2021-03-03 VITALS — BP 130/92 | HR 71 | Temp 98.5°F | Ht 60.0 in | Wt 164.0 lb

## 2021-03-03 DIAGNOSIS — M5416 Radiculopathy, lumbar region: Secondary | ICD-10-CM | POA: Insufficient documentation

## 2021-03-03 DIAGNOSIS — M545 Low back pain, unspecified: Secondary | ICD-10-CM

## 2021-03-03 DIAGNOSIS — W19XXXA Unspecified fall, initial encounter: Secondary | ICD-10-CM | POA: Diagnosis not present

## 2021-03-03 DIAGNOSIS — E0842 Diabetes mellitus due to underlying condition with diabetic polyneuropathy: Secondary | ICD-10-CM | POA: Diagnosis not present

## 2021-03-03 MED ORDER — GABAPENTIN 100 MG PO CAPS: 100.0000 mg | ORAL_CAPSULE | Freq: Every day | ORAL | 5 refills | Status: DC

## 2021-03-03 NOTE — Patient Instructions (Addendum)
     Medications changes include :  increase gabapentin slowly at night up to 1500 mg at night     Your prescription(s) have been submitted to your pharmacy. Please take as directed and contact our office if you believe you are having problem(s) with the medication(s).     Please followup in December as scheduled

## 2021-03-03 NOTE — Assessment & Plan Note (Signed)
Acute on chronic Chronic neuropathy has worsened recently-?  Related to the fall or not Currently taking 1200 mg of gabapentin at night-we can increase the dose slightly, but 1800 mg would be the maximum dose at 1 time Will prescribe gabapentin 100 mg pills and she will try 1-2 and at most 3 at night if needed for neuropathy to see if that helps We both agree we want her on the lowest dose that helps improve her symptoms Continue regular exercise and good sugar control

## 2021-03-03 NOTE — Assessment & Plan Note (Signed)
New problem She fell a couple of months ago-fell off of a stepladder.  She will not get on any ladders in the future She understands the dangers of falls and will be more careful

## 2021-03-03 NOTE — Assessment & Plan Note (Signed)
Acute right lower back pain that seems muscular in nature Do not think she is having sciatic and she does not either, but it is difficult to tell because of her chronic nerve pain in her leg She deferred a muscle relaxer Continue heat Advised gentle stretching Her pain is improving slowly

## 2021-03-05 ENCOUNTER — Other Ambulatory Visit: Payer: Self-pay | Admitting: Endocrinology

## 2021-03-05 DIAGNOSIS — E1142 Type 2 diabetes mellitus with diabetic polyneuropathy: Secondary | ICD-10-CM

## 2021-03-06 ENCOUNTER — Telehealth: Payer: Self-pay | Admitting: Pharmacist

## 2021-03-06 DIAGNOSIS — E1142 Type 2 diabetes mellitus with diabetic polyneuropathy: Secondary | ICD-10-CM

## 2021-03-06 MED ORDER — ONETOUCH ULTRA VI STRP: ORAL_STRIP | 3 refills | Status: DC

## 2021-03-06 NOTE — Telephone Encounter (Signed)
Patient called to request refill of One Touch Ultra test strips to [facility]. Ordered refill to pharmacy.

## 2021-03-17 DIAGNOSIS — E782 Mixed hyperlipidemia: Secondary | ICD-10-CM | POA: Diagnosis not present

## 2021-03-17 DIAGNOSIS — I10 Essential (primary) hypertension: Secondary | ICD-10-CM

## 2021-03-17 DIAGNOSIS — E1142 Type 2 diabetes mellitus with diabetic polyneuropathy: Secondary | ICD-10-CM

## 2021-03-17 DIAGNOSIS — E0842 Diabetes mellitus due to underlying condition with diabetic polyneuropathy: Secondary | ICD-10-CM | POA: Diagnosis not present

## 2021-03-24 ENCOUNTER — Ambulatory Visit (INDEPENDENT_AMBULATORY_CARE_PROVIDER_SITE_OTHER): Payer: Medicare Other | Admitting: *Deleted

## 2021-03-24 DIAGNOSIS — F439 Reaction to severe stress, unspecified: Secondary | ICD-10-CM

## 2021-03-24 DIAGNOSIS — Z599 Problem related to housing and economic circumstances, unspecified: Secondary | ICD-10-CM

## 2021-03-24 DIAGNOSIS — E0842 Diabetes mellitus due to underlying condition with diabetic polyneuropathy: Secondary | ICD-10-CM

## 2021-03-24 DIAGNOSIS — E1142 Type 2 diabetes mellitus with diabetic polyneuropathy: Secondary | ICD-10-CM

## 2021-03-24 NOTE — Patient Instructions (Signed)
Visit Information  The patient verbalized understanding of instructions, educational materials, and care plan provided today and declined offer to receive copy of patient instructions, educational materials, and care plan.   No further follow up required:

## 2021-03-24 NOTE — Chronic Care Management (AMB) (Signed)
Chronic Care Management    Clinical Social Work Note  03/24/2021 Name: Heather [name] MRN: [id number] DOB: Sep 16, 1950  Heather [name] is a 70 y.o. year old female who is a primary care patient of Burns, [name], MD. The CCM team was consulted to assist the patient with chronic disease management and/or care coordination needs related to: [employer]  and [place] and Resources.   Engaged with patient by telephone for follow up visit in response to provider referral for social work chronic care management and care coordination services.   Consent to Services:  The patient was given information about Chronic Care Management services, agreed to services, and gave verbal consent prior to initiation of services.  Please see initial visit note for detailed documentation.   Patient agreed to services and consent obtained.   Assessment: Review of patient past medical history, allergies, medications, and health status, including review of relevant consultants reports was performed today as part of a comprehensive evaluation and provision of chronic care management and care coordination services.     SDOH (Social Determinants of Health) assessments and interventions performed:  SDOH Interventions    Flowsheet Row Most Recent Value  SDOH Interventions   Stress Interventions Intervention Not Indicated, Provide Counseling        Advanced Directives Status: Not addressed in this encounter.  CCM Care Plan  Allergies  Allergen Reactions   Farxiga [Dapagliflozin]     Constant Vaginal Infections   Flagyl [Metronidazole] Other (See Comments)    Made really sick. Was able to tolerate dose 4/17 with zofran   Jardiance [Empagliflozin]     Constant Vaginal infections   Lisinopril     04/03/15 cough reported X several months   Cholestyramine Other (See Comments)    Joint pain   Prandin [Repaglinide] Nausea Only   Sulfonamide Derivatives     nausea    Outpatient  Encounter Medications as of 03/24/2021  Medication Sig   atorvastatin (LIPITOR) 10 MG tablet Take 1 tablet (10 mg total) by mouth daily.   Blood Glucose Monitoring Suppl (ONE TOUCH ULTRA 2) w/Device KIT 1 each by Does not apply route 4 (four) times daily. Use device to monitor glucose levels 4 times per day; E11.9   diclofenac Sodium (VOLTAREN) 1 % GEL APPLY 4 GRAMS TO AFFECTED JOINTS 4 TIMES A DAY   Dulaglutide (TRULICITY) 4.5 SN/0.5LZ SOPN Inject 4.5 mg as directed once a week.   Famotidine (PEPCID PO) 1 tablet as needed.    gabapentin (NEURONTIN) 100 MG capsule Take 1-3 capsules (100-300 mg total) by mouth at bedtime. In addition to 1200 mg at HS   gabapentin (NEURONTIN) 600 MG tablet Take 2 tablets (1,200 mg total) by mouth at bedtime.   glucose blood (ONETOUCH ULTRA) test strip Test once daily. DX E11.42   hydrochlorothiazide (HYDRODIURIL) 25 MG tablet Take 1 tablet (25 mg total) by mouth daily.   Lancets (ONETOUCH DELICA PLUS [id number]) MISC Use to test blood sugars once daily   losartan (COZAAR) 100 MG tablet Take 1 tablet (100 mg total) by mouth daily.   multivitamin-lutein (OCUVITE-LUTEIN) CAPS capsule Take 1 capsule by mouth daily.   No facility-administered encounter medications on file as of 03/24/2021.    Patient Active Problem List   Diagnosis Date Noted   Acute right-sided low back pain without sciatica 03/03/2021   Fall 03/03/2021   Aortic atherosclerosis ([place]) 10/25/2020   Abdominal hernia 10/25/2020   BMI 35.0-35.9,adult 04/26/2020   GERD (gastroesophageal reflux  disease) 04/26/2020   Vitamin D deficiency 04/25/2020   Arthralgia 10/26/2019   Dry cough 11/22/2017   Nocturnal leg cramps 02/02/2017   DOE (dyspnea on exertion) 08/04/2016   Obesity (BMI 30.0-34.9) 08/04/2016   Generalized osteoarthritis of hand 02/04/2016   Diabetic polyneuropathy associated with diabetes mellitus due to underlying condition ([place]) 11/27/2015   Hiatal hernia, large 08/02/2015   Colon  polyps 08/02/2015   Iron deficiency 04/21/2015   Diabetes type 2, controlled ([place]) 05/03/2014   Hyperlipidemia, mixed 03/18/2014   Interstitial cystitis 07/08/2012   Essential hypertension 02/10/2011    Conditions to be addressed/monitored: Depression; Mental Health Concerns   Care Plan : LCSW Plan of Care  Updates made by Deirdre Peer, LCSW since 03/24/2021 12:00 AM     Problem: Financial stress impacting overall health/mental health   Priority: High     Goal: Provide pt with resources/support to aide in reducing stress/depression Completed 03/24/2021  Start Date: 12/18/2020  Expected End Date: 04/16/2021  This Visit's Progress: On track  Recent Progress: On track  Priority: High  Note:   Current barriers:   Patient in need of assistance with connecting to community resources for Financial constraints related to limited income, Mental Health Concerns , and Lacks knowledge of community resource: s Acknowledges deficits with meeting this unmet need Patient is unable to independently navigate community resource options without care coordination support Clinical Goals:  patient will work with SW to address concerns related to depression patient will work with Care Guide to address needs related to community resources to assist with financial strains/stress Clinical Interventions:  CSW spoke with pt today who reports things are going well- she is busy with dog sitting, helping with the grandkids after school and is participating in a study at [facility].  She feels her mental health state is "situational" and is coping well.  Flowsheet Row Chronic Care Management from 03/24/2021 in [place] at [facility] Total Score 0        Pt has great support from family; daughter helping with some of the household bills and planning to go to beach with family for New Years.   Pt feels she is managing/coping well and to plans for our care plan closure.  Pt has also taken some  positive steps with activity and improving her quality of life.   CSW commended pt for her motivation and positive steps.    Collaboration with Binnie Rail, MD regarding development and update of comprehensive plan of care as evidenced by provider attestation and co-signature Inter-disciplinary care team collaboration (see longitudinal plan of care) Assessment of needs, barriers , agencies contacted, as well as how impacting  Review various resources, discussed options and provided patient information about Financial constraints related to limited income Collaborated with appropriate clinical care team members regarding patient needs Financial constraints and Lacks knowledge of community resources, Depression Patient interviewed and appropriate assessments performed Referred patient to community resources care guide team for assistance with financial strains Provided mental health counseling with regard to depression  Other interventions provided: Depression screen reviewed , PHQ2/ PHQ9 completed, Solution-Focused Strategies, Active listening / Reflection utilized , Emotional Supportive Provided, Problem Solving [place] , Psychoeducation for mental health needs , Motivational Interviewing, and Discussed referral to [place] to assist with connecting to mental health provider- pt to consider and call CSW if changes mind Patient Goals:  -consider opportunities for socialization; work, senior centers, Social research officer, government - begin a notebook of services in my neighborhood or  community - call 211 when I need some help - follow-up on any referrals for help I am given - make a list of family or friends that I can call   Follow Up Plan: Client will reach out if needs arise         Follow Up Plan: Client will reach out if further SW needs arise      Eduard Clos MSW, LCSW Licensed Clinical Social Worker [place] [phone]

## 2021-04-16 DIAGNOSIS — E1142 Type 2 diabetes mellitus with diabetic polyneuropathy: Secondary | ICD-10-CM | POA: Diagnosis not present

## 2021-04-16 DIAGNOSIS — E0842 Diabetes mellitus due to underlying condition with diabetic polyneuropathy: Secondary | ICD-10-CM | POA: Diagnosis not present

## 2021-04-28 ENCOUNTER — Encounter: Payer: Self-pay | Admitting: Internal Medicine

## 2021-04-28 NOTE — Patient Instructions (Addendum)
Blood work was ordered.     Medications changes include :   None   A referral was ordered for GI.        Someone from their office will call you to schedule an appointment.    Please followup in 6 months    Health Maintenance, Female Adopting a healthy lifestyle and getting preventive care are important in promoting health and wellness. Ask your health care provider about: The right schedule for you to have regular tests and exams. Things you can do on your own to prevent diseases and keep yourself healthy. What should I know about diet, weight, and exercise? Eat a healthy diet  Eat a diet that includes plenty of vegetables, fruits, low-fat dairy products, and lean protein. Do not eat a lot of foods that are high in solid fats, added sugars, or sodium. Maintain a healthy weight Body mass index (BMI) is used to identify weight problems. It estimates body fat based on height and weight. Your health care provider can help determine your BMI and help you achieve or maintain a healthy weight. Get regular exercise Get regular exercise. This is one of the most important things you can do for your health. Most adults should: Exercise for at least 150 minutes each week. The exercise should increase your heart rate and make you sweat (moderate-intensity exercise). Do strengthening exercises at least twice a week. This is in addition to the moderate-intensity exercise. Spend less time sitting. Even light physical activity can be beneficial. Watch cholesterol and blood lipids Have your blood tested for lipids and cholesterol at 72 years of age, then have this test every 5 years. Have your cholesterol levels checked more often if: Your lipid or cholesterol levels are high. You are older than 72 years of age. You are at high risk for heart disease. What should I know about cancer screening? Depending on your health history and family history, you may need to have cancer screening at various  ages. This may include screening for: Breast cancer. Cervical cancer. Colorectal cancer. Skin cancer. Lung cancer. What should I know about heart disease, diabetes, and high blood pressure? Blood pressure and heart disease High blood pressure causes heart disease and increases the risk of stroke. This is more likely to develop in people who have high blood pressure readings or are overweight. Have your blood pressure checked: Every 3-5 years if you are 33-9 years of age. Every year if you are 46 years old or older. Diabetes Have regular diabetes screenings. This checks your fasting blood sugar level. Have the screening done: Once every three years after age 107 if you are at a normal weight and have a low risk for diabetes. More often and at a younger age if you are overweight or have a high risk for diabetes. What should I know about preventing infection? Hepatitis B If you have a higher risk for hepatitis B, you should be screened for this virus. Talk with your health care provider to find out if you are at risk for hepatitis B infection. Hepatitis C Testing is recommended for: Everyone born from [birth year] through 1967. Anyone with known risk factors for hepatitis C. Sexually transmitted infections (STIs) Get screened for STIs, including gonorrhea and chlamydia, if: You are sexually active and are younger than 72 years of age. You are older than 72 years of age and your health care provider tells you that you are at risk for this type of infection. Your sexual activity has changed  since you were last screened, and you are at increased risk for chlamydia or gonorrhea. Ask your health care provider if you are at risk. Ask your health care provider about whether you are at high risk for HIV. Your health care provider may recommend a prescription medicine to help prevent HIV infection. If you choose to take medicine to prevent HIV, you should first get tested for HIV. You should then be tested  every 3 months for as long as you are taking the medicine. Pregnancy If you are about to stop having your period (premenopausal) and you may become pregnant, seek counseling before you get pregnant. Take 400 to 800 micrograms (mcg) of folic acid every day if you become pregnant. Ask for birth control (contraception) if you want to prevent pregnancy. Osteoporosis and menopause Osteoporosis is a disease in which the bones lose minerals and strength with aging. This can result in bone fractures. If you are 57 years old or older, or if you are at risk for osteoporosis and fractures, ask your health care provider if you should: Be screened for bone loss. Take a calcium or vitamin D supplement to lower your risk of fractures. Be given hormone replacement therapy (HRT) to treat symptoms of menopause. Follow these instructions at home: Alcohol use Do not drink alcohol if: Your health care provider tells you not to drink. You are pregnant, may be pregnant, or are planning to become pregnant. If you drink alcohol: Limit how much you have to: 0-1 drink a day. Know how much alcohol is in your drink. In the [place] one drink equals one 12 oz bottle of beer (355 mL), one 5 oz glass of wine (148 mL), or one 1 oz glass of hard liquor (44 mL). Lifestyle Do not use any products that contain nicotine or tobacco. These products include cigarettes, chewing tobacco, and vaping devices, such as e-cigarettes. If you need help quitting, ask your health care provider. Do not use street drugs. Do not share needles. Ask your health care provider for help if you need support or information about quitting drugs. General instructions Schedule regular health, dental, and eye exams. Stay current with your vaccines. Tell your health care provider if: You often feel depressed. You have ever been abused or do not feel safe at home. Summary Adopting a healthy lifestyle and getting preventive care are important in promoting  health and wellness. Follow your health care provider's instructions about healthy diet, exercising, and getting tested or screened for diseases. Follow your health care provider's instructions on monitoring your cholesterol and blood pressure. This information is not intended to replace advice given to you by your health care provider. Make sure you discuss any questions you have with your health care provider. Document Revised: 09/23/2020 Document Reviewed: 09/23/2020 Elsevier Patient Education  [place].

## 2021-04-28 NOTE — Progress Notes (Signed)
Subjective:    Patient ID: Heather [name], female    DOB: [birth year], 70 y.o.   MRN: [id number]   This visit occurred during the SARS-CoV-2 public health emergency.  Safety protocols were in place, including screening questions prior to the visit, additional usage of staff PPE, and extensive cleaning of exam room while observing appropriate contact time as indicated for disinfecting solutions.    HPI She is here for a physical exam.   One month of rectal bleeding.  Initially she thought it was hemorrhoids.  She also has abdominal pain where she had her surgery - s/p right colectomy for polyps. The pain comes and goes.  BM normal but there is blood in the stool.  She denies constipation or diarrhea.    Medications and allergies reviewed with patient and updated if appropriate.  Patient Active Problem List   Diagnosis Date Noted   Acute right-sided low back pain without sciatica 03/03/2021   Fall 03/03/2021   Aortic atherosclerosis ([place]) 10/25/2020   Abdominal hernia 10/25/2020   BMI 35.0-35.9,adult 04/26/2020   GERD (gastroesophageal reflux disease) 04/26/2020   Vitamin D deficiency 04/25/2020   Arthralgia 10/26/2019   Dry cough 11/22/2017   Nocturnal leg cramps 02/02/2017   DOE (dyspnea on exertion) 08/04/2016   Obesity (BMI 30.0-34.9) 08/04/2016   Generalized osteoarthritis of hand 02/04/2016   Diabetic polyneuropathy associated with diabetes mellitus due to underlying condition ([place]) 11/27/2015   Hiatal hernia, large 08/02/2015   Colon polyps 08/02/2015   Iron deficiency 04/21/2015   Diabetes type 2, controlled ([place]) 05/03/2014   Hyperlipidemia, mixed 03/18/2014   Interstitial cystitis 07/08/2012   Essential hypertension 02/10/2011    Current Outpatient Medications on File Prior to Visit  Medication Sig Dispense Refill   atorvastatin (LIPITOR) 10 MG tablet Take 1 tablet (10 mg total) by mouth daily. 90 tablet 1   Blood Glucose Monitoring Suppl (ONE TOUCH ULTRA 2)  w/Device KIT 1 each by Does not apply route 4 (four) times daily. Use device to monitor glucose levels 4 times per day; E11.9 1 kit 0   diclofenac Sodium (VOLTAREN) 1 % GEL APPLY 4 GRAMS TO AFFECTED JOINTS 4 TIMES A DAY 400 g 11   Dulaglutide (TRULICITY) 4.5 LT/9.0ZE SOPN Inject 4.5 mg as directed once a week. 12 mL 3   Famotidine (PEPCID PO) 1 tablet as needed.      gabapentin (NEURONTIN) 100 MG capsule Take 1-3 capsules (100-300 mg total) by mouth at bedtime. In addition to 1200 mg at HS 90 capsule 5   gabapentin (NEURONTIN) 600 MG tablet Take 2 tablets (1,200 mg total) by mouth at bedtime. 180 tablet 1   glucose blood (ONETOUCH ULTRA) test strip Test once daily. DX E11.42 100 strip 3   hydrochlorothiazide (HYDRODIURIL) 25 MG tablet Take 1 tablet (25 mg total) by mouth daily. 90 tablet 1   Lancets (ONETOUCH DELICA PLUS SPQZRA07M) MISC Use to test blood sugars once daily 100 each 3   losartan (COZAAR) 100 MG tablet Take 1 tablet (100 mg total) by mouth daily. 90 tablet 1   multivitamin-lutein (OCUVITE-LUTEIN) CAPS capsule Take 1 capsule by mouth daily.     No current facility-administered medications on file prior to visit.    Past Medical History:  Diagnosis Date   Anemia    Colon polyps    Diabetes mellitus without complication (HCC)    Environmental allergies    GERD (gastroesophageal reflux disease)    Heart murmur    History of  DVT (deep vein thrombosis) 1970'S   History of hiatal hernia    History of transfusion    Hyperlipidemia    Hypertension    Interstitial cystitis    Dr Janice Norrie   Shortness of breath dyspnea    "due to low hemaglobin"    Past Surgical History:  Procedure Laterality Date   APPENDECTOMY     BILATERAL SALPINGOOPHORECTOMY     painful cysts   CATARACT EXTRACTION, BILATERAL     COLONOSCOPY  2005   negative; Dr Earlean Shawl. F/U declined   CYSTOSCOPY     X 4; Dr Janice Norrie   LAPAROSCOPIC RIGHT COLECTOMY Right 08/19/2015   Procedure: LAPAROSCOPIC ASSISTED  RIGHT  COLECTOMY;  Surgeon: Alphonsa Overall, MD;  Location: [facility];  Service: General;  Laterality: Right;   PREMAGLINANT POLYPS     TOTAL ABDOMINAL HYSTERECTOMY     metromenorrhagia    Social History   Socioeconomic History   Marital status: Widowed    Spouse name: Not on file   Number of children: 2   Years of education: Not on file   Highest education level: Not on file  Occupational History   Not on file  Tobacco Use   Smoking status: Former    Types: Cigarettes    Quit date: 05/18/1984    Years since quitting: 36.9   Smokeless tobacco: Never   Tobacco comments:    smoked 1972-1986, up to 1 ppd  Vaping Use   Vaping Use: Never used  Substance and Sexual Activity   Alcohol use: No    Alcohol/week: 0.0 standard drinks   Drug use: No   Sexual activity: Never  Other Topics Concern   Not on file  Social History Narrative   Lives with brother, daughter and 2 grandchildren in a 3 story home.  Has 2 children.  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     Highest level of education:  2 years of graduate school   Social Determinants of Health   Financial Resource Strain: High Risk   Difficulty of Paying Living Expenses: Hard  Food Insecurity: Food Insecurity Present   Worried About Charity fundraiser in the Last Year: Often true   Ran Out of Food in the Last Year: Sometimes true  Transportation Needs: No Transportation Needs   Lack of Transportation (Medical): No   Lack of Transportation (Non-Medical): No  Physical Activity: Inactive   Days of Exercise per Week: 0 days   Minutes of Exercise per Session: 0 min  Stress: Stress Concern Present   Feeling of Stress : To some extent  Social Connections: Not on file    Family History  Problem Relation Age of Onset   Heart attack Mother        in 58s   Hypertension Mother    Stroke Mother         in 39s   Other Father        Deceased, 64   Stroke Maternal Grandfather 62   Heart  attack Paternal Grandfather        in 18s   Breast cancer Sister    Healthy Brother    Healthy Son    Healthy Daughter    Diabetes Neg Hx     Review of Systems  Constitutional:  Negative for chills and fever.  Eyes:  Negative for visual disturbance.  Respiratory:  Positive for shortness of breath (with strenuous exertion). Negative for  cough and wheezing.   Cardiovascular:  Positive for chest pain (sometimes with overexertion - associated with cough - thinks it is related to weather). Negative for palpitations and leg swelling.  Gastrointestinal:  Positive for abdominal pain, anal bleeding and blood in stool. Negative for constipation, diarrhea and nausea.       No gerd  Genitourinary:  Negative for dysuria.  Musculoskeletal:  Positive for arthralgias and back pain.  Skin:  Negative for rash.  Neurological:  Negative for light-headedness and headaches.  Psychiatric/Behavioral:  Negative for dysphoric mood. The patient is not nervous/anxious.       Objective:   Vitals:   04/29/21 0757  BP: 136/74  Pulse: 73  Temp: 98 F (36.7 C)  SpO2: 98%   Filed Weights   04/29/21 0757  Weight: 164 lb (74.4 kg)   Body mass index is 32.03 kg/m.  BP Readings from Last 3 Encounters:  04/29/21 136/74  03/03/21 (!) 130/92  10/25/20 120/78    Wt Readings from Last 3 Encounters:  04/29/21 164 lb (74.4 kg)  03/03/21 164 lb (74.4 kg)  10/25/20 172 lb (78 kg)    Depression screen [facility] 2/9 03/24/2021 01/22/2021 06/26/2020 04/26/2020 05/25/2018  Decreased Interest 0 1 0 0 0  Down, Depressed, Hopeless 0 2 1 0 0  PHQ - 2 Score 0 3 1 0 0  Altered sleeping - 3 - - 0  Tired, decreased energy - 2 - - 0  Change in appetite - 1 - - 0  Feeling bad or failure about yourself  - 1 - - 0  Trouble concentrating - 1 - - 0  Moving slowly or fidgety/restless - 0 - - 0  Suicidal thoughts - 0 - - 0  PHQ-9 Score - 11 - - 0  Difficult doing work/chores - Not difficult at all - - -     No flowsheet data  found.     Physical Exam Constitutional: She appears well-developed and well-nourished. No distress.  HENT:  Head: Normocephalic and atraumatic.  Right Ear: External ear normal. Normal ear canal and TM Left Ear: External ear normal.  Normal ear canal and TM Mouth/Throat: Oropharynx is clear and moist.  Eyes: Conjunctivae and EOM are normal.  Neck: Neck supple. No tracheal deviation present. No thyromegaly present.  No carotid bruit  Cardiovascular: Normal rate, regular rhythm and normal heart sounds.   No murmur heard.  No edema. Pulmonary/Chest: Effort normal and breath sounds normal. No respiratory distress. She has no wheezes. She has no rales.  Breast: deferred   Abdominal: Soft. She exhibits no distension. There is no tenderness.  Lymphadenopathy: She has no cervical adenopathy.  Skin: Skin is warm and dry. She is not diaphoretic.  Psychiatric: She has a normal mood and affect. Her behavior is normal.     Lab Results  Component Value Date   WBC 6.1 04/29/2021   HGB 14.2 04/29/2021   HCT 42.9 04/29/2021   PLT 325.0 04/29/2021   GLUCOSE 117 (H) 04/29/2021   CHOL 243 (H) 04/29/2021   TRIG 94.0 04/29/2021   HDL 69.20 04/29/2021   LDLDIRECT 108.0 04/27/2019   LDLCALC 155 (H) 04/29/2021   ALT 14 04/29/2021   AST 15 04/29/2021   NA 136 04/29/2021   K 3.8 04/29/2021   CL 98 04/29/2021   CREATININE 0.80 04/29/2021   BUN 17 04/29/2021   CO2 29 04/29/2021   TSH 1.11 04/29/2021   HGBA1C 6.7 (H) 04/29/2021   MICROALBUR 3.5 (H) 02/04/2016  Assessment & Plan:   Physical exam: Screening blood work  ordered Exercise  walking Weight  has lost weight - still working on weight loss Substance abuse  none   Reviewed recommended immunizations.   Health Maintenance  Topic Date Due   DEXA SCAN  02/03/2021   FOOT EXAM  04/25/2021   HEMOGLOBIN A1C  10/28/2021   OPHTHALMOLOGY EXAM  11/29/2021   MAMMOGRAM  01/18/2023   TETANUS/TDAP  02/03/2026   COLONOSCOPY  (Pts 45-17yr Insurance coverage will need to be confirmed)  08/17/2027   Pneumonia Vaccine 70 Years old  Completed   INFLUENZA VACCINE  Completed   COVID-19 Vaccine  Completed   Hepatitis C Screening  Completed   Zoster Vaccines- Shingrix  Completed   HPV VACCINES  Aged Out          See Problem List for Assessment and Plan of chronic medical problems.

## 2021-04-29 ENCOUNTER — Ambulatory Visit (INDEPENDENT_AMBULATORY_CARE_PROVIDER_SITE_OTHER): Payer: Medicare Other | Admitting: Internal Medicine

## 2021-04-29 ENCOUNTER — Other Ambulatory Visit: Payer: Self-pay

## 2021-04-29 VITALS — BP 136/74 | HR 73 | Temp 98.0°F | Ht 60.0 in | Wt 164.0 lb

## 2021-04-29 DIAGNOSIS — E1142 Type 2 diabetes mellitus with diabetic polyneuropathy: Secondary | ICD-10-CM

## 2021-04-29 DIAGNOSIS — E0842 Diabetes mellitus due to underlying condition with diabetic polyneuropathy: Secondary | ICD-10-CM

## 2021-04-29 DIAGNOSIS — K625 Hemorrhage of anus and rectum: Secondary | ICD-10-CM | POA: Diagnosis not present

## 2021-04-29 DIAGNOSIS — E559 Vitamin D deficiency, unspecified: Secondary | ICD-10-CM

## 2021-04-29 DIAGNOSIS — I10 Essential (primary) hypertension: Secondary | ICD-10-CM | POA: Diagnosis not present

## 2021-04-29 DIAGNOSIS — Z0000 Encounter for general adult medical examination without abnormal findings: Secondary | ICD-10-CM

## 2021-04-29 DIAGNOSIS — E782 Mixed hyperlipidemia: Secondary | ICD-10-CM | POA: Diagnosis not present

## 2021-04-29 DIAGNOSIS — E669 Obesity, unspecified: Secondary | ICD-10-CM

## 2021-04-29 DIAGNOSIS — Z13820 Encounter for screening for osteoporosis: Secondary | ICD-10-CM | POA: Diagnosis not present

## 2021-04-29 LAB — COMPREHENSIVE METABOLIC PANEL
ALT: 14 U/L (ref 0–35)
AST: 15 U/L (ref 0–37)
Albumin: 4.2 g/dL (ref 3.5–5.2)
Alkaline Phosphatase: 60 U/L (ref 39–117)
BUN: 17 mg/dL (ref 6–23)
CO2: 29 mEq/L (ref 19–32)
Calcium: 10.2 mg/dL (ref 8.4–10.5)
Chloride: 98 mEq/L (ref 96–112)
Creatinine, Ser: 0.8 mg/dL (ref 0.40–1.20)
GFR: 73.53 mL/min (ref >60.00)
Glucose, Bld: 117 mg/dL — ABNORMAL HIGH (ref 70–99)
Potassium: 3.8 mEq/L (ref 3.5–5.1)
Sodium: 136 mEq/L (ref 135–145)
Total Bilirubin: 1.5 mg/dL — ABNORMAL HIGH (ref 0.2–1.2)
Total Protein: 7.2 g/dL (ref 6.0–8.3)

## 2021-04-29 LAB — CBC WITH DIFFERENTIAL/PLATELET
Basophils Absolute: 0 10*3/uL (ref 0.0–0.1)
Basophils Relative: 0.7 % (ref 0.0–3.0)
Eosinophils Absolute: 0.2 10*3/uL (ref 0.0–0.7)
Eosinophils Relative: 3.8 % (ref 0.0–5.0)
HCT: 42.9 % (ref 36.0–46.0)
Hemoglobin: 14.2 g/dL (ref 12.0–15.0)
Lymphocytes Relative: 29.7 % (ref 12.0–46.0)
Lymphs Abs: 1.8 10*3/uL (ref 0.7–4.0)
MCHC: 33.2 g/dL (ref 30.0–36.0)
MCV: 77.1 fl — ABNORMAL LOW (ref 78.0–100.0)
Monocytes Absolute: 0.6 10*3/uL (ref 0.1–1.0)
Monocytes Relative: 9.9 % (ref 3.0–12.0)
Neutro Abs: 3.4 10*3/uL (ref 1.4–7.7)
Neutrophils Relative %: 55.9 % (ref 43.0–77.0)
Platelets: 325 10*3/uL (ref 150.0–400.0)
RBC: 5.56 Mil/uL — ABNORMAL HIGH (ref 3.87–5.11)
RDW: 14.9 % (ref 11.5–15.5)
WBC: 6.1 10*3/uL (ref 4.0–10.5)

## 2021-04-29 LAB — VITAMIN D 25 HYDROXY (VIT D DEFICIENCY, FRACTURES): VITD: 31.65 ng/mL (ref 30.00–100.00)

## 2021-04-29 LAB — LIPID PANEL
Cholesterol: 243 mg/dL — ABNORMAL HIGH (ref 0–200)
HDL: 69.2 mg/dL (ref >39.00)
LDL Cholesterol: 155 mg/dL — ABNORMAL HIGH (ref 0–99)
NonHDL: 173.35
Total CHOL/HDL Ratio: 4
Triglycerides: 94 mg/dL (ref 0.0–149.0)
VLDL: 18.8 mg/dL (ref 0.0–40.0)

## 2021-04-29 LAB — HEMOGLOBIN A1C: Hgb A1c MFr Bld: 6.7 % — ABNORMAL HIGH (ref 4.6–6.5)

## 2021-04-29 LAB — TSH: TSH: 1.11 u[IU]/mL (ref 0.35–5.50)

## 2021-04-29 NOTE — Assessment & Plan Note (Addendum)
Chronic Lab Results  Component Value Date   HGBA1C 7.0 (H) 10/25/2020   Check A1c today Continue Trulicity 4.5 mg weekly-there is a shortage of the medication nationally so we will give her samples of the 3 mg Trulicity-2 doses.  Hopefully her pharmacy will get in her medication at her dose, if not we will need to improvise Stressed diabetic diet and regular exercise

## 2021-04-29 NOTE — Assessment & Plan Note (Signed)
Chronic Working on weight loss She is walking regularly and trying to eat healthy and eat less-she will continue her weight loss efforts

## 2021-04-29 NOTE — Assessment & Plan Note (Signed)
Chronic Taking vitamin D daily Check vitamin D level  

## 2021-04-29 NOTE — Assessment & Plan Note (Signed)
Chronic Regular exercise and healthy diet encouraged Check lipid panel  Continue atorvastatin 10 mg daily 

## 2021-04-29 NOTE — Assessment & Plan Note (Addendum)
Chronic Continue gabapentin at night-increased dose at her last visit - 1400 mg

## 2021-04-29 NOTE — Assessment & Plan Note (Signed)
Chronic Blood pressure well controlled CMP Continue HCTZ 25 mg daily losartan 100 mg daily

## 2021-04-30 ENCOUNTER — Encounter: Payer: Self-pay | Admitting: Internal Medicine

## 2021-05-07 ENCOUNTER — Other Ambulatory Visit: Payer: Self-pay | Admitting: Internal Medicine

## 2021-05-07 DIAGNOSIS — I10 Essential (primary) hypertension: Secondary | ICD-10-CM

## 2021-05-09 ENCOUNTER — Encounter: Payer: Self-pay | Admitting: Gastroenterology

## 2021-05-26 ENCOUNTER — Ambulatory Visit (INDEPENDENT_AMBULATORY_CARE_PROVIDER_SITE_OTHER): Payer: Medicare Other

## 2021-05-26 DIAGNOSIS — E782 Mixed hyperlipidemia: Secondary | ICD-10-CM

## 2021-05-26 DIAGNOSIS — I10 Essential (primary) hypertension: Secondary | ICD-10-CM

## 2021-05-26 NOTE — Patient Instructions (Signed)
Visit Information  Following are the goals we discussed today:   Manage My Medication  Timeframe:  Long-Range Goal Priority:  High Start Date:     11/25/20                        Expected End Date:   02/24/22             Follow Up Date April 2023   - call for medicine refill 2 or 3 days before it runs out - call if I am sick and can't take my medicine - keep a list of all the medicines I take; vitamins and herbals too - use a pillbox to sort medicine    Why is this important?   These steps will help you keep on track with your medicines.  Plan: Telephone follow up appointment with care management team member scheduled for:  3 months  The patient has been provided with contact information for the care management team and has been advised to call with any health related questions or concerns.   Tomasa Blase, PharmD Clinical Pharmacist, [facility]   Please call the care guide team at [phone] if you need to cancel or reschedule your appointment.   Patient verbalizes understanding of instructions provided today and agrees to view in [place].

## 2021-05-26 NOTE — Progress Notes (Signed)
Chronic Care Management Pharmacy Note  05/26/2021 Name:  Heather [name] MRN:  [id number] DOB:  [birth year]  Summary: -Patient reports that pain levels have been improving since her fall in August  -Reports that she was able to pick up a 3 month supply of her trulicity 9.5MW weekly dose- BG averaging 125 since she has been able to restart trulicity  -notes that she had trialed increased dose of gabapentin, did not feel a difference in pain levels since increase  -Reports compliance to her atorvastatin, last LDL elevated at 155 - patient reports that she does not believe she was fasted for most recent lab  -Most recent office BP has been in range, patient denies any symptoms of hypo/hypertension   Recommendations/Changes made from today's visit: -Recommending for patient to decrease gabapentin back to 1244m at bedtime as she did not report benefit from 1507mdose -discussed increased in atorvastatin dose, likely LDL elevated due to not being fasted , but previous result was >70 - patient declined at this time, preferred to continue with diet and exercise changes, recommending repeat lipid panel with next PCP visit (increase intensity should LDL remain elevated from goal)   Subjective: Heather FIEBIGs an [age] year old female who is a primary patient of Burns, StClaudina LickMD.  The CCM team was consulted for assistance with disease management and care coordination needs.    Engaged with patient by telephone for follow up visit in response to provider referral for pharmacy case management and/or care coordination services.   Consent to Services:  The patient was given information about Chronic Care Management services, agreed to services, and gave verbal consent prior to initiation of services.  Please see initial visit note for detailed documentation.   Patient Care Team: BuBinnie RailMD as PCP - General (Internal Medicine) MeRichmond CampbellMD as Consulting Physician  (Gastroenterology) ElRenato ShinMD as Consulting Physician (Endocrinology) SzTomasa BlaseRPSurgcenter Of Greater DallasPharmacist)  Recent office visits: 04/29/2021 - Dr. BuQuay Burow continue medications, referred to GI - follow up in 6 months  03/03/2021 - Dr. BuQuay Burow fall (12/2020) - residual pain - increase gabapentin from 120035maily to 1500m75mily -follow up in 6 weeks   Recent consult visits: None since last visit   Hospital visits: None in previous 6 months  Objective:  Lab Results  Component Value Date   CREATININE 0.80 04/29/2021   BUN 17 04/29/2021   GFR 73.53 04/29/2021   GFRNONAA >60 08/21/2015   GFRAA >60 08/21/2015   NA 136 04/29/2021   K 3.8 04/29/2021   CALCIUM 10.2 04/29/2021   CO2 29 04/29/2021   GLUCOSE 117 (H) 04/29/2021    Lab Results  Component Value Date/Time   HGBA1C 6.7 (H) 04/29/2021 08:39 AM   HGBA1C 7.0 (H) 10/25/2020 08:32 AM   HGBA1C 6.7 04/21/2018 08:04 AM   FRUCTOSAMINE 282 04/26/2020 08:49 AM   GFR 73.53 04/29/2021 08:39 AM   GFR 69.60 10/25/2020 08:32 AM   MICROALBUR 3.5 (H) 02/04/2016 10:23 AM   MICROALBUR 0.8 11/30/2014 03:56 PM    Last diabetic Eye exam:  Lab Results  Component Value Date/Time   HMDIABEYEEXA No Retinopathy 11/29/2020 12:00 AM    Last diabetic Foot exam: No results found for: HMDIABFOOTEX   Lab Results  Component Value Date   CHOL 243 (H) 04/29/2021   HDL 69.20 04/29/2021   LDLCALC 155 (H) 04/29/2021   LDLDIRECT 108.0 04/27/2019   TRIG 94.0 04/29/2021   CHOLHDL 4  04/29/2021    Hepatic Function Latest Ref Rng & Units 04/29/2021 10/25/2020 04/26/2020  Total Protein 6.0 - 8.3 g/dL 7.2 7.3 7.4  Albumin 3.5 - 5.2 g/dL 4.2 4.4 4.2  AST 0 - 37 U/L '15 17 17  ' ALT 0 - 35 U/L '14 17 18  ' Alk Phosphatase 39 - 117 U/L 60 56 57  Total Bilirubin 0.2 - 1.2 mg/dL 1.5(H) 1.3(H) 1.3(H)  Bilirubin, Direct 0.0 - 0.3 mg/dL - - -    Lab Results  Component Value Date/Time   TSH 1.11 04/29/2021 08:39 AM   TSH 1.14 03/13/2020 09:20 AM     CBC Latest Ref Rng & Units 04/29/2021 04/26/2020 10/26/2019  WBC 4.0 - 10.5 K/uL 6.1 6.2 5.8  Hemoglobin 12.0 - 15.0 g/dL 14.2 12.9 12.3  Hematocrit 36.0 - 46.0 % 42.9 37.6 36.3  Platelets 150.0 - 400.0 K/uL 325.0 381.0 307.0   Iron/TIBC/Ferritin/ %Sat    Component Value Date/Time   IRON 68 04/26/2020 0847   TIBC 399 04/26/2020 0847   FERRITIN 32 04/26/2020 0847   IRONPCTSAT 17 04/26/2020 0847    Lab Results  Component Value Date/Time   VD25OH 31.65 04/29/2021 08:39 AM   VD25OH 42.36 04/26/2020 08:47 AM   Clinical ASCVD: No  The 10-year ASCVD risk score (Arnett DK, et al., 2019) is: 31.3%   Values used to calculate the score:     Age: 71 years     Sex: Female     Is Non-Hispanic African American: No     Diabetic: Yes     Tobacco smoker: No     Systolic Blood Pressure: 416 mmHg     Is BP treated: Yes     HDL Cholesterol: 69.2 mg/dL     Total Cholesterol: 243 mg/dL    Depression screen [facility] 2/9 03/24/2021 01/22/2021 06/26/2020  Decreased Interest 0 1 0  Down, Depressed, Hopeless 0 2 1  PHQ - 2 Score 0 3 1  Altered sleeping - 3 -  Tired, decreased energy - 2 -  Change in appetite - 1 -  Feeling bad or failure about yourself  - 1 -  Trouble concentrating - 1 -  Moving slowly or fidgety/restless - 0 -  Suicidal thoughts - 0 -  PHQ-9 Score - 11 -  Difficult doing work/chores - Not difficult at all -     BP Readings from Last 3 Encounters:  04/29/21 136/74  03/03/21 (!) 130/92  10/25/20 120/78   Pulse Readings from Last 3 Encounters:  04/29/21 73  03/03/21 71  10/25/20 65   Wt Readings from Last 3 Encounters:  04/29/21 164 lb (74.4 kg)  03/03/21 164 lb (74.4 kg)  10/25/20 172 lb (78 kg)   BMI Readings from Last 3 Encounters:  04/29/21 32.03 kg/m  03/03/21 32.03 kg/m  10/25/20 33.59 kg/m    Assessment/Interventions: Review of patient past medical history, allergies, medications, health status, including review of consultants reports, laboratory and other  test data, was performed as part of comprehensive evaluation and provision of chronic care management services.   SDOH:  (Social Determinants of Health) assessments and interventions performed: Yes  SDOH Screenings   Alcohol Screen: Low Risk    Last Alcohol Screening Score (AUDIT): 0  Depression (PHQ2-9): Low Risk    PHQ-2 Score: 0  Financial Resource Strain: High Risk   Difficulty of Paying Living Expenses: Hard  Food Insecurity: Food Insecurity Present   Worried About Running Out of Food in the Last Year: Often true  Ran Out of Food in the Last Year: Sometimes true  Housing: [place] Risk Score: 0  Physical Activity: Inactive   Days of Exercise per Week: 0 days   Minutes of Exercise per Session: 0 min  Social Connections: Not on file  Stress: Stress Concern Present   Feeling of Stress : To some extent  Tobacco Use: Medium Risk   Smoking Tobacco Use: Former   Smokeless Tobacco Use: Never   Passive Exposure: Not on file  Transportation Needs: No Transportation Needs   Lack of Transportation (Medical): No   Lack of Transportation (Non-Medical): No    CCM Care Plan  Allergies  Allergen Reactions   Farxiga [Dapagliflozin]     Constant Vaginal Infections   Flagyl [Metronidazole] Other (See Comments)    Made really sick. Was able to tolerate dose 4/17 with zofran   Jardiance [Empagliflozin]     Constant Vaginal infections   Lisinopril     04/03/15 cough reported X several months   Cholestyramine Other (See Comments)    Joint pain   Prandin [Repaglinide] Nausea Only   Sulfonamide Derivatives     nausea    Medications Reviewed Today     Reviewed by Tomasa Blase, [facility] (Pharmacist) on 05/26/21 at [place] List Status: <None>   Medication Order Taking? Sig Documenting Provider Last Dose Status Informant  atorvastatin (LIPITOR) 10 MG tablet [id number] Yes TAKE 1 TABLET BY MOUTH EVERY DAY Burns, [name], MD Taking Active   Blood Glucose Monitoring  Suppl (ONE TOUCH ULTRA 2) w/Device KIT [id number] Yes 1 each by Does not apply route 4 (four) times daily. Use device to monitor glucose levels 4 times per day; E11.9 Renato Shin, MD Taking Active   diclofenac Sodium (VOLTAREN) 1 % GEL [id number] Yes APPLY 4 GRAMS TO AFFECTED JOINTS 4 TIMES A DAY Gregor Hams, MD Taking Active   Dulaglutide (TRULICITY) 4.5 IR/4.8NI SOPN [id number] Yes Inject 4.5 mg as directed once a week. Binnie Rail, MD Taking Active   Famotidine (PEPCID PO) [id number] Yes Take 1 tablet by mouth daily as needed. [provider] Taking Active   gabapentin (NEURONTIN) 600 MG tablet [id number] Yes Take 2 tablets (1,200 mg total) by mouth at bedtime. Binnie Rail, MD Taking Active   glucose blood [facility] ULTRA) test strip [id number] Yes Test once daily. DX E52.77 Binnie Rail, MD Taking Active   hydrochlorothiazide (HYDRODIURIL) 25 MG tablet [id number] Yes TAKE 1 TABLET (25 MG TOTAL) BY MOUTH DAILY. Binnie Rail, MD Taking Active   Lancets (ONETOUCH DELICA PLUS [id number]) [place] [id number] Yes Use to test blood sugars once daily Renato Shin, MD Taking Active   losartan (COZAAR) 100 MG tablet [id number] Yes TAKE 1 TABLET BY MOUTH EVERY DAY Burns, [name], MD Taking Active   multivitamin-lutein [facility]) CAPS capsule [id number] No Take 1 capsule by mouth daily.  Patient not taking: Reported on 05/26/2021   [provider] Not Taking Active             Patient Active Problem List   Diagnosis Date Noted   Acute right-sided low back pain without sciatica 03/03/2021   Fall 03/03/2021   Aortic atherosclerosis ([place]) 10/25/2020   Abdominal hernia 10/25/2020   GERD (gastroesophageal reflux disease) 04/26/2020   Vitamin D deficiency 04/25/2020   Arthralgia 10/26/2019   Dry cough 11/22/2017   Nocturnal leg cramps 02/02/2017   DOE (dyspnea on exertion) 08/04/2016   Obesity (BMI 30.0-34.9)  08/04/2016   Generalized osteoarthritis of hand 02/04/2016    Diabetic polyneuropathy associated with diabetes mellitus due to underlying condition ([place]) 11/27/2015   Hiatal hernia, large 08/02/2015   Colon polyps 08/02/2015   Iron deficiency 04/21/2015   Diabetes type 2, controlled ([place]) 05/03/2014   Hyperlipidemia, mixed 03/18/2014   Interstitial cystitis 07/08/2012   Essential hypertension 02/10/2011    Immunization History  Administered Date(s) Administered   Hepatitis A 11/14/2003   Influenza Whole 01/17/2012   Influenza, High Dose Seasonal PF 02/20/2014, 01/15/2018, 12/26/2018, 01/15/2020, 01/17/2021   Influenza-Unspecified 02/24/2013, 02/19/2015, 01/24/2016, 01/07/2017   PFIZER Comirnaty(Gray Top)Covid-19 Tri-Sucrose Vaccine 08/21/2020   PFIZER(Purple Top)SARS-COV-2 Vaccination 06/06/2019, 06/24/2019, 02/13/2020   Pfizer Covid-19 Vaccine Bivalent Booster 64yr & up 01/23/2021   Pneumococcal Conjugate-13 03/07/2014   Pneumococcal Polysaccharide-23 08/02/2015   Td 06/11/2006   Tdap 02/04/2016   Zoster Recombinat (Shingrix) 09/08/2016, 03/08/2017   Zoster, Live 03/21/2014    Conditions to be addressed/monitored:  Hypertension, Hyperlipidemia and Diabetes, Aortic atherosclerosis  Care Plan : CParshall Updates made by STomasa Blase RPH since 05/26/2021 12:00 AM     Problem: Hypertension, Hyperlipidemia and Diabetes, Leg cramps   Priority: High     Long-Range Goal: Disease mangement   Start Date: 08/16/2020  Expected End Date: 02/24/2022  This Visit's Progress: On track  Recent Progress: On track  Priority: High  Note:   Current Barriers:  Unable to independently monitor therapeutic efficacy Unable to maintain control of diabetes  Pharmacist Clinical Goal(s):  Patient will achieve adherence to monitoring guidelines and medication adherence to achieve therapeutic efficacy maintain control of Diabetes as evidenced by A1c  through collaboration with PharmD and provider.   Interventions: 1:1 collaboration with  BBinnie Rail MD regarding development and update of comprehensive plan of care as evidenced by provider attestation and co-signature Inter-disciplinary care team collaboration (see longitudinal plan of care) Comprehensive medication review performed; medication list updated in electronic medical record  Hypertension  Controlled  BP goal < 130/80 Patient checks BP at home 1-2x per week Patient home BP readings are ranging: 130/80s BP Readings from Last 3 Encounters:  04/29/21 136/74  03/03/21 (!) 130/92  10/25/20 120/78  Patient has failed these meds in the past: n/a Patient is currently controlled on the following medications:  Losartan 100 mg daily HCTZ 25 mg daily   We discussed: BP is at goal; she reports [facility] but the program is ending soon and pt is worried about losing the support of the program;recently finished with program last week    Plan: Continue current medications and control with diet and exercise     Diabetes (Dx 2015)  Controlled  A1c goal < 8% Lab Results  Component Value Date   HGBA1C 6.7 (H) 04/29/2021  Checking BG: Daily Recent FBG Readings: 130-150 No low BG since stopping repaglinide   Patient has failed these meds in past: Farxiga, Jardiance (mycotic infections), metformin (short gut), nateglinide, Januvia, actos (edema), repaglinide (vomiting) Patient is currently uncontrolled on the following medications:  Trulicity 4.5 mg once weekly (Wednesday) - $9.80 x 3 months (LIS)   We discussed: Pt endorses compliance with Trulicity; AX4Gis most recently at goal;  -pt reports lifestyle modifications ([facility]: Pt reports losing 40 lbs in the program. 11,000 steps per day, eating high-protein yogurt, sugar-free jello, trying hard to reduce carbs.   Plan: Continue current medications and  control with diet and exercise    Hyperlipidemia  Uncontrolled  LDL goal < 70 Lab Results   Component Value Date   LDLCALC 155 (H) 04/29/2021  Aortic atherosclerosis (Xray 2019) Patient has failed these meds in past: n/a Patient is currently uncontrolled on the following medications:  Atorvastatin 10 mg daily     We discussed:  her LDL was increased most recently (155) patient reports compliance with atorvastatin, does not believe she was fasted for most recent lipid panel which could explain elevation.  Reviewed with patient possibility of increasing intensity of statin therapy, patient declined at this time, agreeable to increase with next lipid panel should LDL remain elevated from goal   Plan: Continue current medications and control with diet and exercise   Pain / Cramps  Controlled - improved from last visit  Hx nocturnal leg cramps, diabetic neuropathy, osteoarthritis Patient has failed these meds in past: duloxetine (pt never started) Patient is currently uncontrolled on the following medications:  Gabapentin 1514m at bedtime  - reports that she has been taking 12040mdaily  Voltaren gel  We discussed: Patient trialed increased gabapentin dosing, reports that she was unable to tell a major change in pain levels, has since decreased back to 120034maily - recommending for patient to continue with decreased dose of gabapentin   Patient Goals/Self-Care Activities Patient will:  - take medications as prescribed -focus on medication adherence by pill box -check glucose daily -target a minimum of 150 minutes of moderate intensity exercise weekly -engage in dietary modifications by reducing carbs     Medication Assistance: None required.  Patient affirms current coverage meets needs. - LIS  Care Gaps: DEXA scan (02/03/21)  [facility]  [id number] [place] [facility] [address]: [phone]: [phone] [place] [address] [address]: [phone]: [phone] pill box? Yes Pt endorses 100% compliance  Care Plan and Follow Up Patient Decision:  Patient agrees to Care Plan and Follow-up.  Plan: Telephone follow up appointment with care management team member scheduled for:  3 months  DaniTomasa BlasearmD Clinical Pharmacist, [place]

## 2021-06-17 DIAGNOSIS — E782 Mixed hyperlipidemia: Secondary | ICD-10-CM | POA: Diagnosis not present

## 2021-06-17 DIAGNOSIS — I10 Essential (primary) hypertension: Secondary | ICD-10-CM | POA: Diagnosis not present

## 2021-06-19 ENCOUNTER — Other Ambulatory Visit: Payer: Self-pay | Admitting: Endocrinology

## 2021-06-27 ENCOUNTER — Other Ambulatory Visit: Payer: Self-pay

## 2021-06-27 ENCOUNTER — Ambulatory Visit (INDEPENDENT_AMBULATORY_CARE_PROVIDER_SITE_OTHER): Payer: Medicare Other

## 2021-06-27 DIAGNOSIS — Z0000 Encounter for general adult medical examination without abnormal findings: Secondary | ICD-10-CM

## 2021-06-27 NOTE — Progress Notes (Signed)
I connected with Heather [name] today by telephone and verified that I am speaking with the correct person using two identifiers. Location patient: home Location provider: work Persons participating in the virtual visit: patient, provider.   I discussed the limitations, risks, security and privacy concerns of performing an evaluation and management service by telephone and the availability of in person appointments. I also discussed with the patient that there may be a patient responsible charge related to this service. The patient expressed understanding and verbally consented to this telephonic visit.    Interactive audio and video telecommunications were attempted between this provider and patient, however failed, due to patient having technical difficulties OR patient did not have access to video capability.  We continued and completed visit with audio only.  Some vital signs may be absent or patient reported.   Time Spent with patient on telephone encounter: 40 minutes  Subjective:   Heather [name] is a 71 y.o. female who presents for Medicare Annual (Subsequent) preventive examination.  Review of Systems     Cardiac Risk Factors include: advanced age (>65mn, >>68women);diabetes mellitus;dyslipidemia;hypertension;family history of premature cardiovascular disease     Objective:    There were no vitals filed for this visit. There is no height or weight on file to calculate BMI.  Advanced Directives 06/27/2021 06/26/2020 05/16/2018 02/16/2017 11/28/2015 08/19/2015 08/19/2015  Does Patient Have a Medical Advance Directive? Yes Yes No No Yes Yes Yes  Type of Advance Directive Living [place] - - - [place]  Does patient want to make changes to medical advance directive? No - Patient declined No - Patient declined - - - No - Patient declined No - Patient declined  Copy of [place] Chart? - - - - - Yes Yes  Would patient like information on creating a medical advance directive? - - Yes (Inpatient - patient requests chaplain consult to create a medical advance directive) Yes (ED - Information included in AVS) - - -    Current Medications (verified) Outpatient Encounter Medications as of 06/27/2021  Medication Sig   atorvastatin (LIPITOR) 10 MG tablet TAKE 1 TABLET BY MOUTH EVERY DAY   Blood Glucose Monitoring Suppl (ONE TOUCH ULTRA 2) w/Device KIT 1 each by Does not apply route 4 (four) times daily. Use device to monitor glucose levels 4 times per day; E11.9   diclofenac Sodium (VOLTAREN) 1 % GEL APPLY 4 GRAMS TO AFFECTED JOINTS 4 TIMES A DAY   Dulaglutide (TRULICITY) 4.5 MWV/3.7TGSOPN Inject 4.5 mg as directed once a week.   Famotidine (PEPCID PO) Take 1 tablet by mouth daily as needed.   gabapentin (NEURONTIN) 600 MG tablet Take 2 tablets (1,200 mg total) by mouth at bedtime.   glucose blood (ONETOUCH ULTRA) test strip Test once daily. DX E11.42   hydrochlorothiazide (HYDRODIURIL) 25 MG tablet TAKE 1 TABLET (25 MG TOTAL) BY MOUTH DAILY.   Lancets (ONETOUCH DELICA PLUS [id number] MISC USE TO TEST BLOOD SUGARS  ONCE DAILY   losartan (COZAAR) 100 MG tablet TAKE 1 TABLET BY MOUTH EVERY DAY   multivitamin-lutein (OCUVITE-LUTEIN) CAPS capsule Take 1 capsule by mouth daily. (Patient not taking: Reported on 05/26/2021)   No facility-administered encounter medications on file as of 06/27/2021.    Allergies (verified) Farxiga [dapagliflozin], Flagyl [metronidazole], Jardiance [empagliflozin], Lisinopril, Cholestyramine, Prandin [repaglinide], and Sulfonamide derivatives   History: Past Medical History:  Diagnosis Date   Anemia  Colon polyps    Diabetes mellitus without complication (HCC)    Environmental allergies    GERD (gastroesophageal reflux disease)    Heart murmur    History of DVT (deep vein thrombosis) 1970'S   History of hiatal hernia     History of transfusion    Hyperlipidemia    Hypertension    Interstitial cystitis    Dr Janice Norrie   Shortness of breath dyspnea    "due to low hemaglobin"   Past Surgical History:  Procedure Laterality Date   APPENDECTOMY     BILATERAL SALPINGOOPHORECTOMY     painful cysts   CATARACT EXTRACTION, BILATERAL     COLONOSCOPY  2005   negative; Dr Earlean Shawl. F/U declined   CYSTOSCOPY     X 4; Dr Janice Norrie   LAPAROSCOPIC RIGHT COLECTOMY Right 08/19/2015   Procedure: LAPAROSCOPIC ASSISTED  RIGHT COLECTOMY;  Surgeon: Alphonsa Overall, MD;  Location: [facility];  Service: General;  Laterality: Right;   PREMAGLINANT POLYPS     TOTAL ABDOMINAL HYSTERECTOMY     metromenorrhagia   Family History  Problem Relation Age of Onset   Heart attack Mother        in 51s   Hypertension Mother    Stroke Mother         in 29s   Other Father        Deceased, 74   Stroke Maternal Grandfather 41   Heart attack Paternal Grandfather        in 15s   Breast cancer Sister    Healthy Brother    Healthy Son    Healthy Daughter    Diabetes Neg Hx    Social History   Socioeconomic History   Marital status: Widowed    Spouse name: Not on file   Number of children: 2   Years of education: Not on file   Highest education level: Not on file  Occupational History   Not on file  Tobacco Use   Smoking status: Former    Types: Cigarettes    Quit date: 05/18/1984    Years since quitting: 37.1   Smokeless tobacco: Never   Tobacco comments:    smoked 1972-1986, up to 1 ppd  Vaping Use   Vaping Use: Never used  Substance and Sexual Activity   Alcohol use: No    Alcohol/week: 0.0 standard drinks   Drug use: No   Sexual activity: Never  Other Topics Concern   Not on file  Social History Narrative   Lives with brother, daughter and 2 grandchildren in a 3 story home.  Has 2 children.  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     Highest level of education:  2 years of  graduate school   Social Determinants of Health   Financial Resource Strain: Low Risk    Difficulty of Paying Living Expenses: Not hard at all  Food Insecurity: No Food Insecurity   Worried About Charity fundraiser in the Last Year: Never true   Arboriculturist in the Last Year: Never true  Transportation Needs: No Transportation Needs   Lack of Transportation (Medical): No   Lack of Transportation (Non-Medical): No  Physical Activity: Inactive   Days of Exercise per Week: 0 days   Minutes of Exercise per Session: 0 min  Stress: No Stress Concern Present   Feeling of Stress : Not at all  Social Connections: Moderately Integrated  Frequency of Communication with Friends and Family: More than three times a week   Frequency of Social Gatherings with Friends and Family: More than three times a week   Attends Religious Services: 1 to 4 times per year   Active Member of [employer] or Organizations: Yes   Attends Archivist Meetings: 1 to 4 times per year   Marital Status: Widowed    Tobacco Counseling Counseling given: Not Answered Tobacco comments: smoked 1972-1986, up to 1 ppd   Clinical Intake:  Pre-visit preparation completed: Yes  Pain : No/denies pain     Nutritional Risks: None Diabetes: Yes CBG done?: No Did pt. bring in CBG monitor from home?: No  How often do you need to have someone help you when you read instructions, pamphlets, or other written materials from your doctor or pharmacy?: 1 - Never What is the last grade level you completed in school?: Bachelor's Degree  Diabetic? yes  Interpreter Needed?: No  Information entered by :: Lisette Abu, LPN   Activities of Daily Living In your present state of health, do you have any difficulty performing the following activities: 06/27/2021  Hearing? N  Vision? N  Difficulty concentrating or making decisions? N  Walking or climbing stairs? N  Dressing or bathing? N  Doing errands, shopping? N   Preparing Food and eating ? N  Using the Toilet? N  In the past six months, have you accidently leaked urine? N  Do you have problems with loss of bowel control? N  Managing your Medications? N  Managing your Finances? N  Housekeeping or managing your Housekeeping? N  Some recent data might be hidden    Patient Care Team: Binnie Rail, MD as PCP - General (Internal Medicine) Richmond Campbell, MD as Consulting Physician (Gastroenterology) Renato Shin, MD as Consulting Physician (Endocrinology) Delice Bison Darnelle Maffucci, [facility] (Pharmacist) Camillo Flaming, OD as Referring Physician (Optometry)  Indicate any recent Medical Services you may have received from other than [facility] providers in the past year (date may be approximate).     Assessment:   This is a routine wellness examination for Christiona.  Hearing/Vision screen Hearing Screening - Comments:: Patient denied any hearing difficulty.   No hearing aids.  Vision Screening - Comments:: Patient wears corrective glasses/contacts.  Eye exam done annually by: Camillo Flaming, OD.  Dietary issues and exercise activities discussed: Current Exercise Habits: The patient does not participate in regular exercise at present, Exercise limited by: orthopedic condition(s)   Goals Addressed   None   Depression Screen PHQ 2/9 Scores 06/27/2021 03/24/2021 01/22/2021 06/26/2020 04/26/2020 05/25/2018 05/16/2018  PHQ - 2 Score 0 0 3 1 0 0 2  PHQ- 9 Score - - 11 - - 0 5    Fall Risk Fall Risk  06/27/2021 06/26/2020 10/26/2019 05/25/2018 05/16/2018  Falls in the past year? 1 0 0 0 0  Number falls in past yr: 0 0 0 0 -  Injury with Fall? 1 0 0 0 -  Risk for fall due to : History of fall(s);Impaired balance/gait Impaired balance/gait;Orthopedic patient No Fall Risks - -  Follow up Falls evaluation completed Falls evaluation completed Falls evaluation completed - -    FALL RISK PREVENTION PERTAINING TO THE HOME:  Any stairs in or around the home? Yes  If so, are  there any without handrails? No  Home free of loose throw rugs in walkways, pet beds, electrical cords, etc? Yes  Adequate lighting in your home to reduce risk of  falls? Yes   ASSISTIVE DEVICES UTILIZED TO PREVENT FALLS:  Life alert? No  Use of a cane, walker or w/c? Yes  Grab bars in the bathroom? No  Shower chair or bench in shower? No  Elevated toilet seat or a handicapped toilet? No   TIMED UP AND GO:  Was the test performed? No .  Length of time to ambulate 10 feet: n/a sec.   Gait steady and fast without use of assistive device  Cognitive Function: MMSE - Mini Mental State Exam 02/16/2017  Orientation to time 5  Orientation to Place 5  Registration 3  Attention/ Calculation 5  Recall 2  Language- name 2 objects 2  Language- repeat 1  Language- follow 3 step command 3  Language- read & follow direction 1  Write a sentence 1  Copy design 1  Total score 29        Immunizations Immunization History  Administered Date(s) Administered   Hepatitis A 11/14/2003   Influenza Whole 01/17/2012   Influenza, High Dose Seasonal PF 02/20/2014, 01/15/2018, 12/26/2018, 01/15/2020, 01/17/2021   Influenza-Unspecified 02/24/2013, 02/19/2015, 01/24/2016, 01/07/2017   PFIZER Comirnaty(Gray Top)Covid-19 Tri-Sucrose Vaccine 08/21/2020   PFIZER(Purple Top)SARS-COV-2 Vaccination 06/06/2019, 06/24/2019, 02/13/2020   Pfizer Covid-19 Vaccine Bivalent Booster 32yr & up 01/23/2021   Pneumococcal Conjugate-13 03/07/2014   Pneumococcal Polysaccharide-23 08/02/2015   Td 06/11/2006   Tdap 02/04/2016   Zoster Recombinat (Shingrix) 09/08/2016, 03/08/2017   Zoster, Live 03/21/2014    TDAP status: Up to date  Flu Vaccine status: Up to date  Pneumococcal vaccine status: Up to date  Covid-19 vaccine status: Completed vaccines  Qualifies for Shingles Vaccine? Yes   Zostavax completed Yes   Shingrix Completed?: Yes  Screening Tests Health Maintenance  Topic Date Due   DEXA SCAN   02/03/2021   FOOT EXAM  04/25/2021   HEMOGLOBIN A1C  10/28/2021   OPHTHALMOLOGY EXAM  11/29/2021   MAMMOGRAM  01/18/2023   TETANUS/TDAP  02/03/2026   COLONOSCOPY (Pts 45-488yrInsurance coverage will need to be confirmed)  08/17/2027   Pneumonia Vaccine 6594Years old  Completed   INFLUENZA VACCINE  Completed   COVID-19 Vaccine  Completed   Hepatitis C Screening  Completed   Zoster Vaccines- Shingrix  Completed   HPV VACCINES  Aged Out    Health Maintenance  Health Maintenance Due  Topic Date Due   DEXA SCAN  02/03/2021   FOOT EXAM  04/25/2021    Colorectal cancer screening: Type of screening: Colonoscopy. Completed 08/16/2017. Repeat every 5-10 years  Mammogram status: Completed 01/17/2021. Repeat every year  Bone Density status: Completed 02/04/2016. Results reflect: Bone density results: NORMAL. Repeat every 5 years. (Scheduled for 07/02/2021)  Lung Cancer Screening: (Low Dose CT Chest recommended if Age 73-80ears, 30 pack-year currently smoking OR have quit w/in 15years.) does not qualify.   Lung Cancer Screening Referral: no  Additional Screening:  Hepatitis C Screening: does qualify; Completed yes  Vision Screening: Recommended annual ophthalmology exams for early detection of glaucoma and other disorders of the eye. Is the patient up to date with their annual eye exam?  Yes  Who is the provider or what is the name of the office in which the patient attends annual eye exams? [facility]. If pt is not established with a provider, would they like to be referred to a provider to establish care? No .   Dental Screening: Recommended annual dental exams for proper oral hygiene  Community Resource Referral / Chronic Care Management:  CRR required this visit?  No   CCM required this visit?  No      Plan:     I have personally reviewed and noted the following in the patients chart:   Medical and social history Use of alcohol, tobacco or illicit drugs  Current  medications and supplements including opioid prescriptions.  Functional ability and status Nutritional status Physical activity Advanced directives List of other physicians Hospitalizations, surgeries, and ER visits in previous 12 months Vitals Screenings to include cognitive, depression, and falls Referrals and appointments  In addition, I have reviewed and discussed with patient certain preventive protocols, quality metrics, and best practice recommendations. A written personalized care plan for preventive services as well as general preventive health recommendations were provided to patient.     Sheral Flow, LPN   2/45/8099   Nurse Notes:  Patient is cogitatively intact. There were no vitals filed for this visit. There is no height or weight on file to calculate BMI.

## 2021-06-27 NOTE — Patient Instructions (Signed)
[name] , Thank you for taking time to come for your Medicare Wellness Visit. I appreciate your ongoing commitment to your health goals. Please review the following plan we discussed and let me know if I can assist you in the future.   Screening recommendations/referrals: Colonoscopy: 08/16/2017; due every 5-10 years (scheduled for office visit 06/30/2021) Mammogram: 01/17/2021; due every year Bone Density: 02/04/2016; due every 5 years (scheduled for 07/02/2021) Recommended yearly ophthalmology/optometry visit for glaucoma screening and checkup Recommended yearly dental visit for hygiene and checkup  Vaccinations: Influenza vaccine: 01/17/2021 Pneumococcal vaccine: 03/07/2014, 08/02/2015 Tdap vaccine: 02/04/2016; due every 10 years Shingles vaccine: 03/08/2017, 09/08/2016   Covid-19: 06/06/2019, 06/24/2019, 02/13/2020, 08/21/2020, 01/23/2021  Advanced directives: Yes; Living Will on file.  Conditions/risks identified: Yes; Client understands the importance of follow-up with providers by attending scheduled visits and discussed goals to eat healthier, increase physical activity, exercise the brain, socialize more, get enough sleep and make time for laughter.  Next appointment: 06/29/2022 at 9:20 a.m. telephone visit with Nurse Health Advisor.  If need to reschedule or cancel please call [phone].   Preventive Care 50 Years and Older, Female Preventive care refers to lifestyle choices and visits with your health care provider that can promote health and wellness. What does preventive care include? A yearly physical exam. This is also called an annual well check. Dental exams once or twice a year. Routine eye exams. Ask your health care provider how often you should have your eyes checked. Personal lifestyle choices, including: Daily care of your teeth and gums. Regular physical activity. Eating a healthy diet. Avoiding tobacco and drug use. Limiting alcohol use. Practicing safe sex. Taking  low-dose aspirin every day. Taking vitamin and mineral supplements as recommended by your health care provider. What happens during an annual well check? The services and screenings done by your health care provider during your annual well check will depend on your age, overall health, lifestyle risk factors, and family history of disease. Counseling  Your health care provider may ask you questions about your: Alcohol use. Tobacco use. Drug use. Emotional well-being. Home and relationship well-being. Sexual activity. Eating habits. History of falls. Memory and ability to understand (cognition). Work and work Statistician. Reproductive health. Screening  You may have the following tests or measurements: Height, weight, and BMI. Blood pressure. Lipid and cholesterol levels. These may be checked every 5 years, or more frequently if you are over 42 years old. Skin check. Lung cancer screening. You may have this screening every year starting at age 93 if you have a 30-pack-year history of smoking and currently smoke or have quit within the past 15 years. Fecal occult blood test (FOBT) of the stool. You may have this test every year starting at age 49. Flexible sigmoidoscopy or colonoscopy. You may have a sigmoidoscopy every 5 years or a colonoscopy every 10 years starting at age 36. Hepatitis C blood test. Hepatitis B blood test. Sexually transmitted disease (STD) testing. Diabetes screening. This is done by checking your blood sugar (glucose) after you have not eaten for a while (fasting). You may have this done every 1-3 years. Bone density scan. This is done to screen for osteoporosis. You may have this done starting at age 62. Mammogram. This may be done every 1-2 years. Talk to your health care provider about how often you should have regular mammograms. Talk with your health care provider about your test results, treatment options, and if necessary, the need for more tests. Vaccines  Your health care provider may recommend certain vaccines, such as: Influenza vaccine. This is recommended every year. Tetanus, diphtheria, and acellular pertussis (Tdap, Td) vaccine. You may need a Td booster every 10 years. Zoster vaccine. You may need this after age 81. Pneumococcal 13-valent conjugate (PCV13) vaccine. One dose is recommended after age 40. Pneumococcal polysaccharide (PPSV23) vaccine. One dose is recommended after age 55. Talk to your health care provider about which screenings and vaccines you need and how often you need them. This information is not intended to replace advice given to you by your health care provider. Make sure you discuss any questions you have with your health care provider. Document Released: 05/31/2015 Document Revised: 01/22/2016 Document Reviewed: 03/05/2015 Elsevier Interactive Patient Education  2017 [place] Prevention in the Home Falls can cause injuries. They can happen to people of all ages. There are many things you can do to make your home safe and to help prevent falls. What can I do on the outside of my home? Regularly fix the edges of walkways and driveways and fix any cracks. Remove anything that might make you trip as you walk through a door, such as a raised step or threshold. Trim any bushes or trees on the path to your home. Use bright outdoor lighting. Clear any walking paths of anything that might make someone trip, such as rocks or tools. Regularly check to see if handrails are loose or broken. Make sure that both sides of any steps have handrails. Any raised decks and porches should have guardrails on the edges. Have any leaves, snow, or ice cleared regularly. Use sand or salt on walking paths during winter. Clean up any spills in your garage right away. This includes oil or grease spills. What can I do in the bathroom? Use night lights. Install grab bars by the toilet and in the tub and shower. Do not use towel  bars as grab bars. Use non-skid mats or decals in the tub or shower. If you need to sit down in the shower, use a plastic, non-slip stool. Keep the floor dry. Clean up any water that spills on the floor as soon as it happens. Remove soap buildup in the tub or shower regularly. Attach bath mats securely with double-sided non-slip rug tape. Do not have throw rugs and other things on the floor that can make you trip. What can I do in the bedroom? Use night lights. Make sure that you have a light by your bed that is easy to reach. Do not use any sheets or blankets that are too big for your bed. They should not hang down onto the floor. Have a firm chair that has side arms. You can use this for support while you get dressed. Do not have throw rugs and other things on the floor that can make you trip. What can I do in the kitchen? Clean up any spills right away. Avoid walking on wet floors. Keep items that you use a lot in easy-to-reach places. If you need to reach something above you, use a strong step stool that has a grab bar. Keep electrical cords out of the way. Do not use floor polish or wax that makes floors slippery. If you must use wax, use non-skid floor wax. Do not have throw rugs and other things on the floor that can make you trip. What can I do with my stairs? Do not leave any items on the stairs. Make sure that there are handrails  on both sides of the stairs and use them. Fix handrails that are broken or loose. Make sure that handrails are as long as the stairways. Check any carpeting to make sure that it is firmly attached to the stairs. Fix any carpet that is loose or worn. Avoid having throw rugs at the top or bottom of the stairs. If you do have throw rugs, attach them to the floor with carpet tape. Make sure that you have a light switch at the top of the stairs and the bottom of the stairs. If you do not have them, ask someone to add them for you. What else can I do to help  prevent falls? Wear shoes that: Do not have high heels. Have rubber bottoms. Are comfortable and fit you well. Are closed at the toe. Do not wear sandals. If you use a stepladder: Make sure that it is fully opened. Do not climb a closed stepladder. Make sure that both sides of the stepladder are locked into place. Ask someone to hold it for you, if possible. Clearly mark and make sure that you can see: Any grab bars or handrails. First and last steps. Where the edge of each step is. Use tools that help you move around (mobility aids) if they are needed. These include: Canes. Walkers. Scooters. Crutches. Turn on the lights when you go into a dark area. Replace any light bulbs as soon as they burn out. Set up your furniture so you have a clear path. Avoid moving your furniture around. If any of your floors are uneven, fix them. If there are any pets around you, be aware of where they are. Review your medicines with your doctor. Some medicines can make you feel dizzy. This can increase your chance of falling. Ask your doctor what other things that you can do to help prevent falls. This information is not intended to replace advice given to you by your health care provider. Make sure you discuss any questions you have with your health care provider. Document Released: 02/28/2009 Document Revised: 10/10/2015 Document Reviewed: 06/08/2014 Elsevier Interactive Patient Education  2017 [employer].

## 2021-06-30 ENCOUNTER — Other Ambulatory Visit (INDEPENDENT_AMBULATORY_CARE_PROVIDER_SITE_OTHER): Payer: Medicare Other

## 2021-06-30 ENCOUNTER — Ambulatory Visit: Payer: Medicare Other | Admitting: Gastroenterology

## 2021-06-30 ENCOUNTER — Encounter: Payer: Self-pay | Admitting: Gastroenterology

## 2021-06-30 VITALS — BP 120/84 | HR 97 | Ht 60.0 in | Wt 168.0 lb

## 2021-06-30 DIAGNOSIS — R718 Other abnormality of red blood cells: Secondary | ICD-10-CM

## 2021-06-30 DIAGNOSIS — Z860100 Personal history of colon polyps, unspecified: Secondary | ICD-10-CM

## 2021-06-30 DIAGNOSIS — R109 Unspecified abdominal pain: Secondary | ICD-10-CM

## 2021-06-30 DIAGNOSIS — R110 Nausea: Secondary | ICD-10-CM | POA: Diagnosis not present

## 2021-06-30 DIAGNOSIS — K625 Hemorrhage of anus and rectum: Secondary | ICD-10-CM | POA: Diagnosis not present

## 2021-06-30 DIAGNOSIS — K439 Ventral hernia without obstruction or gangrene: Secondary | ICD-10-CM | POA: Diagnosis not present

## 2021-06-30 DIAGNOSIS — K449 Diaphragmatic hernia without obstruction or gangrene: Secondary | ICD-10-CM | POA: Diagnosis not present

## 2021-06-30 LAB — CBC WITH DIFFERENTIAL/PLATELET
Basophils Absolute: 0 10*3/uL (ref 0.0–0.1)
Basophils Relative: 0.3 % (ref 0.0–3.0)
Eosinophils Absolute: 0.2 10*3/uL (ref 0.0–0.7)
Eosinophils Relative: 3 % (ref 0.0–5.0)
HCT: 43.6 % (ref 36.0–46.0)
Hemoglobin: 14.7 g/dL (ref 12.0–15.0)
Lymphocytes Relative: 32.1 % (ref 12.0–46.0)
Lymphs Abs: 2.2 10*3/uL (ref 0.7–4.0)
MCHC: 33.6 g/dL (ref 30.0–36.0)
MCV: 76.7 fl — ABNORMAL LOW (ref 78.0–100.0)
Monocytes Absolute: 0.6 10*3/uL (ref 0.1–1.0)
Monocytes Relative: 8.3 % (ref 3.0–12.0)
Neutro Abs: 3.8 10*3/uL (ref 1.4–7.7)
Neutrophils Relative %: 56.3 % (ref 43.0–77.0)
Platelets: 328 10*3/uL (ref 150.0–400.0)
RBC: 5.68 Mil/uL — ABNORMAL HIGH (ref 3.87–5.11)
RDW: 14.4 % (ref 11.5–15.5)
WBC: 6.8 10*3/uL (ref 4.0–10.5)

## 2021-06-30 MED ORDER — ONDANSETRON 4 MG PO TBDP: 4.0000 mg | ORAL_TABLET | Freq: Three times a day (TID) | ORAL | 3 refills | Status: DC | PRN

## 2021-06-30 MED ORDER — CITRUCEL PO POWD: 1.0000 | Freq: Every day | ORAL | Status: DC

## 2021-06-30 NOTE — Patient Instructions (Addendum)
If you are age 73 or older, your body mass index should be between 23-30. Your Body mass index is 32.81 kg/m. If this is out of the aforementioned range listed, please consider follow up with your Primary Care Provider.  If you are age 45 or younger, your body mass index should be between 19-25. Your Body mass index is 32.81 kg/m. If this is out of the aformentioned range listed, please consider follow up with your Primary Care Provider.   ________________________________________________________  The [facility] providers would like to encourage you to use [facility] to communicate with providers for non-urgent requests or questions.  Due to long hold times on the telephone, sending your provider a message by [facility] may be a faster and more efficient way to get a response.  Please allow 48 business hours for a response.  Please remember that this is for non-urgent requests.  _______________________________________________________   Heather [name] have been scheduled for a CT scan of the abdomen and pelvis at [facility], 1st floor Radiology. You are scheduled on Monday, 07-07-21 at [place] should arrive 30 minutes prior to your appointment time for registration.  The solution may taste better if refrigerated, but do NOT add ice or any other liquid to this solution. Shake well before drinking.   Please follow the written instructions below on the day of your exam:   1) Do not eat anything after 4:00am (4 hours prior to your test)   2) Drink 1 bottle of contrast @ 6:00am (2 hours prior to your exam)  Remember to shake well before drinking and do NOT pour over ice.     Drink 1 bottle of contrast @ 7:00am (1 hour prior to your exam)   You may take any medications as prescribed with a small amount of water, if necessary. If you take any of the following medications: METFORMIN, GLUCOPHAGE, GLUCOVANCE, AVANDAMET, RIOMET, FORTAMET, [place] MET, JANUMET, GLUMETZA or METAGLIP, you MAY be asked to HOLD this  medication 48 hours AFTER the exam.   The purpose of you drinking the oral contrast is to aid in the visualization of your intestinal tract. The contrast solution may cause some diarrhea. Depending on your individual set of symptoms, you may also receive an intravenous injection of x-ray contrast/dye. Plan on being at [facility] for 45 minutes or longer, depending on the type of exam you are having performed.   If you have any questions regarding your exam or if you need to reschedule, you may call [facility] at [phone] between the hours of 8:00 am and 5:00 pm, Monday-Friday.    Stop NSAIDs.  Please go to the lab in the basement of our building to have lab work done as you leave today. Hit "B" for basement when you get on the elevator.  When the doors open the lab is on your left.  We will call you with the results. Thank you.  We have sent the following medications to your pharmacy for you to pick up at your convenience: Zofran 4 mg ODT: Take every 6 hours as needed  Please purchase the following medications over the counter and take as directed: Citrucel: Take once daily  It has been recommended to you by your physician that you have a(n) EGD/Colonoscopy completed. Per your request, we did not schedule the procedure(s) today. Please contact our office at [phone] should you decide to have the procedure completed. You will be scheduled for a pre-visit and procedure at that time.  [facility] can provide transportation: [address] [place], [place], [address] [phone]  Thank you for entrusting me with your care and for choosing [facility], Dr. Graham Cellar

## 2021-06-30 NOTE — Progress Notes (Signed)
HPI :  71 year old female with a history of colon polyps, small bowel polyps, active bleeding, microcytosis with history of iron deficiency, diabetes, history of DVT, referred by Billey Gosling for symptoms of rectal bleeding, abdominal pain, history of polyps.  Reviewed patient's endoscopic history with her.  She had a colonoscopy showing a 3 cm cecal polyp and another 15 mm ascending polyp in 2017 with Dr. Earlean Shawl, done for work-up of iron deficiency anemia at the time.  She also had a EGD at that time showing a large hiatal hernia as well, but no measurements provided.  She was referred to tertiary care [facility] for advanced endoscopy to remove polyps but was told her cecal polyp was too advanced/high risk for endoscopic removal and underwent right hemicolectomy in 2017.  She had since recovered from that.  Had a surveillance colonoscopy in April 2019 with Dr. Earlean Shawl which showed an 8 mm pedunculated polyp removed from the ileum consistent with a benign ileal inflammatory polyp.  No other colonic polyps, told to repeat a colonoscopy in 5 years.  She states she had been in her usual state of health but developed some bleeding symptoms this past December.  States she was doing part of a walking exercise upwards of 5 miles per day and noticed near the end of her walk she was getting some urgency with stools and passing blood with mucus.  This happened several times, she had some discomfort with her abdomen and mid to lower abdomen when it happened.  However since December she is really not noticed any recurrent symptoms.  Bleeding has since resolved.  Her main complaint is that she has had some periumbilical to epigastric abdominal discomfort.  States this is intermittent but she feels about 3 times a week.  Her abdomen becomes protuberant and hard with swelling.  She uses a heating pad to help relieve it and some Advil as needed.  The symptom of her abdominal pain actually dates back to a few months from  last year.  She has altered bowel movements anywhere from not going at all in a particular day upwards of 5 bowel movements per day that can be soft to loose to hard in regards to stool form.  She is not really taking much of anything for her bowels at this time.  Again bleeding has stopped.  On review of work-up of labs during her prior evaluation in the past she has had negative celiac testing in 2018, had stool that was normal for pancreatic fecal elastase.  On review of labs she has had microcytosis with MCV in the 70s dating back to 2016 with evidence of iron deficiency in the past.  She has reflux that bothers her occasionally.  She takes Pepcid as needed which works pretty well for her symptoms.  This generally does not bother her too much.  No dysphagia.  She does have chronic nausea which is fairly frequent but she does not vomit.  She has been taking Trulicity and states it works pretty well for her diabetes.  She is not taking any blood thinners at this time.  No family history of GI tract malignancies.  Her last colonoscopy was in 2019 as outlined below, she is due for routine surveillance in 2024.  Prior work-up: Colonoscopy 08/17/2017 - "inflammatory ileal polyp" - 93m peduculated polyp removed from the small bowel, sigmoid diverticulosis, hemorrhoids, otherwise no colonic polyps. Surgical anastomosis in right colon - told to repeat in 5 years per Dr. MEarlean Shawl  Colonoscopy 05/2015 - dx of IDA - large 3cm cecal polyp that was not removed, another 38m ascending colon polyp not removed.   EGD 06/13/2015 - normal esophagus - "large hiatal hernia" - normal exam otherwise  Labs 04/29/21 - MCV 77, Hgb 14.2, plt 325 MCV in the 70s dating back years History of low ferritin dating back to 2016 (< 5) and 8 in 11/2019, last checked 04/2020 was normal   Stress echo 09/03/2016 - negative stress echo, no ischemic changes   Past Medical History:  Diagnosis Date   Anemia    Colon polyps    Diabetes  mellitus without complication (HCC)    Environmental allergies    GERD (gastroesophageal reflux disease)    Heart murmur    History of DVT (deep vein thrombosis) 1970'S   History of hiatal hernia    History of transfusion    Hyperlipidemia    Hypertension    Interstitial cystitis    Dr NJanice Norrie  Shortness of breath dyspnea    "due to low hemaglobin"     Past Surgical History:  Procedure Laterality Date   APPENDECTOMY     BILATERAL SALPINGOOPHORECTOMY     painful cysts   CATARACT EXTRACTION, BILATERAL     COLONOSCOPY  2005   negative; Dr MEarlean Shawl F/U declined   CYSTOSCOPY     X 4; Dr NJanice Norrie  LAPAROSCOPIC RIGHT COLECTOMY Right 08/19/2015   Procedure: LAPAROSCOPIC ASSISTED  RIGHT COLECTOMY;  Surgeon: DAlphonsa Overall MD;  Location: [facility];  Service: General;  Laterality: Right;   PREMAGLINANT POLYPS     TOTAL ABDOMINAL HYSTERECTOMY     metromenorrhagia   Family History  Problem Relation Age of Onset   Heart attack Mother        in 751s  Hypertension Mother    Stroke Mother         in 750s  Other Father        Deceased, 83  Stroke Maternal Grandfather 523  Heart attack Paternal Grandfather        in 662s  Breast cancer Sister    Healthy Brother    Healthy Son    Healthy Daughter    Diabetes Neg Hx    Social History   Tobacco Use   Smoking status: Former    Types: Cigarettes    Quit date: 05/18/1984    Years since quitting: 37.1   Smokeless tobacco: Never   Tobacco comments:    smoked 1972-1986, up to 1 ppd  Vaping Use   Vaping Use: Never used  Substance Use Topics   Alcohol use: No    Alcohol/week: 0.0 standard drinks   Drug use: No   Current Outpatient Medications  Medication Sig Dispense Refill   atorvastatin (LIPITOR) 10 MG tablet TAKE 1 TABLET BY MOUTH EVERY DAY 90 tablet 1   Blood Glucose Monitoring Suppl (ONE TOUCH ULTRA 2) w/Device KIT 1 each by Does not apply route 4 (four) times daily. Use device to monitor glucose levels 4 times per day; E11.9 1 kit 0    diclofenac Sodium (VOLTAREN) 1 % GEL APPLY 4 GRAMS TO AFFECTED JOINTS 4 TIMES A DAY 400 g 11   Dulaglutide (TRULICITY) 4.5 MJO/8.4ZYSOPN Inject 4.5 mg as directed once a week. 12 mL 3   Famotidine (PEPCID PO) Take 1 tablet by mouth daily as needed.     gabapentin (NEURONTIN) 600 MG tablet Take 2 tablets (1,200 mg total) by mouth at  bedtime. 180 tablet 1   glucose blood (ONETOUCH ULTRA) test strip Test once daily. DX E11.42 100 strip 3   hydrochlorothiazide (HYDRODIURIL) 25 MG tablet TAKE 1 TABLET (25 MG TOTAL) BY MOUTH DAILY. 90 tablet 1   Lancets (ONETOUCH DELICA PLUS EQASTM19Q) MISC USE TO TEST BLOOD SUGARS  ONCE DAILY 100 each 3   losartan (COZAAR) 100 MG tablet TAKE 1 TABLET BY MOUTH EVERY DAY 90 tablet 1   multivitamin-lutein (OCUVITE-LUTEIN) CAPS capsule Take 1 capsule by mouth daily.     No current facility-administered medications for this visit.   Allergies  Allergen Reactions   Farxiga [Dapagliflozin]     Constant Vaginal Infections   Flagyl [Metronidazole] Other (See Comments)    Made really sick. Was able to tolerate dose 4/17 with zofran   Jardiance [Empagliflozin]     Constant Vaginal infections   Lisinopril     04/03/15 cough reported X several months   Cholestyramine Other (See Comments)    Joint pain   Prandin [Repaglinide] Nausea Only   Sulfonamide Derivatives     nausea     Review of Systems: All systems reviewed and negative except where noted in HPI.    Lab Results  Component Value Date   WBC 6.1 04/29/2021   HGB 14.2 04/29/2021   HCT 42.9 04/29/2021   MCV 77.1 (L) 04/29/2021   PLT 325.0 04/29/2021    Lab Results  Component Value Date   CREATININE 0.80 04/29/2021   BUN 17 04/29/2021   NA 136 04/29/2021   K 3.8 04/29/2021   CL 98 04/29/2021   CO2 29 04/29/2021    Lab Results  Component Value Date   ALT 14 04/29/2021   AST 15 04/29/2021   ALKPHOS 60 04/29/2021   BILITOT 1.5 (H) 04/29/2021   CBC Latest Ref Rng & Units 04/29/2021  04/26/2020 10/26/2019  WBC 4.0 - 10.5 K/uL 6.1 6.2 5.8  Hemoglobin 12.0 - 15.0 g/dL 14.2 12.9 12.3  Hematocrit 36.0 - 46.0 % 42.9 37.6 36.3  Platelets 150.0 - 400.0 K/uL 325.0 381.0 307.0     Physical Exam: BP 120/84    Pulse 97    Ht 5' (1.524 m)    Wt 168 lb (76.2 kg)    BMI 32.81 kg/m  Constitutional: Pleasant,well-developed, female in no acute distress. HEENT: Normocephalic and atraumatic. Conjunctivae are normal. No scleral icterus. Neck supple.  Cardiovascular: Normal rate, regular rhythm.  Pulmonary/chest: Effort normal and breath sounds normal. No wheezing, rales or rhonchi. Abdominal: Soft, nondistended, nontender. Large either distasis recti vs. Ventral hernia periumbilical to epigastric area. There are no masses palpable. No hepatomegaly. Extremities: no edema Lymphadenopathy: No cervical adenopathy noted. Neurological: Alert and oriented to person place and time. Skin: Skin is warm and dry. No rashes noted. Psychiatric: Normal mood and affect. Behavior is normal.   ASSESSMENT AND PLAN: 71 year old female here for reassessment of the following:  Rectal bleeding Microcytosis -remote history of iron deficiency Hiatal hernia Chronic nausea Ventral hernia Abdominal pain History of colon polyps  History as outlined above.  She has chronic microcytosis with history of iron deficiency but no recent iron studies done, presenting with multiple episodes of rectal bleeding with urgency in December which is since resolved.  She has a history of advanced polyps leading to surgery in 2017.  We discussed differential diagnosis for her symptoms.  I asked her to go to the lab for blood draw to assess for iron deficiency, she is agreeable to this.  Ultimately I  do think she warrants a colonoscopy given the symptoms, especially if she has ongoing iron deficiency.  We discussed what this is, risks and benefits and she is agreeable to proceed.  She will need assistance with transportation for  this exam, gave her information about [facility] to help with this.  If she does have an iron deficiency I also am recommending repeat EGD, reassess hiatal hernia, assess for [facility] lesion etc.  She is also willing to do this.  She has chronic nausea which may be related to her Trulicity, but she does not want to come off Trulicity as it works well for her diabetes and she wants to avoid insulin.  We will give her some Zofran to use as needed.  She does take NSAIDs routinely and recommend she stop doing that in light of her suspected iron deficiency.  For her bowel habit changes, recommend Citrucel once daily to see if that will provide some regularity.  Otherwise, she has either large diastases recti versus ventral hernia, this appears to bother her routinely.  She has no prior abdominal imaging.  Recommend CT scan abdomen pelvis to further evaluate this and pending findings may refer her to see surgery, although this may be a large repair.  She wants to proceed with CT initially and we will go from there.  Further recommendations pending results and course.  If she has recurrent bleeding in the interim she should contact me.  Plan: - CT abdomen / pelvis - further evaluate suspected ventral hernia - lab today for CBC, TIBC / ferritin - nausea perhaps due to trulicity as above, stasrt Zofran 44m ODT PRN - stop NSAIDs - start Citrucel once daily - schedule EGD and colonoscopy, may need help with transportation, [facility] assistance  SJolly Mango MD [facility]  CC: BBinnie Rail MD

## 2021-07-01 LAB — COMPREHENSIVE METABOLIC PANEL
ALT: 13 U/L (ref 0–35)
AST: 15 U/L (ref 0–37)
Albumin: 4.5 g/dL (ref 3.5–5.2)
Alkaline Phosphatase: 68 U/L (ref 39–117)
BUN: 16 mg/dL (ref 6–23)
CO2: 30 mEq/L (ref 19–32)
Calcium: 10.1 mg/dL (ref 8.4–10.5)
Chloride: 99 mEq/L (ref 96–112)
Creatinine, Ser: 0.87 mg/dL (ref 0.40–1.20)
GFR: 66.41 mL/min (ref >60.00)
Glucose, Bld: 129 mg/dL — ABNORMAL HIGH (ref 70–99)
Potassium: 4.5 mEq/L (ref 3.5–5.1)
Sodium: 136 mEq/L (ref 135–145)
Total Bilirubin: 1.2 mg/dL (ref 0.2–1.2)
Total Protein: 7.4 g/dL (ref 6.0–8.3)

## 2021-07-01 LAB — IBC + FERRITIN
Ferritin: 14.3 ng/mL (ref 10.0–291.0)
Iron: 82 ug/dL (ref 42–145)
Saturation Ratios: 16.7 % — ABNORMAL LOW (ref 20.0–50.0)
TIBC: 490 ug/dL — ABNORMAL HIGH (ref 250.0–450.0)
Transferrin: 350 mg/dL (ref 212.0–360.0)

## 2021-07-02 ENCOUNTER — Ambulatory Visit (INDEPENDENT_AMBULATORY_CARE_PROVIDER_SITE_OTHER)
Admission: RE | Admit: 2021-07-02 | Discharge: 2021-07-02 | Disposition: A | Discharge status: Home / Self Care | Payer: Medicare Other | Source: Ambulatory Visit | Attending: Internal Medicine | Admitting: Internal Medicine

## 2021-07-02 ENCOUNTER — Other Ambulatory Visit: Payer: Self-pay

## 2021-07-02 ENCOUNTER — Encounter: Payer: Self-pay | Admitting: Gastroenterology

## 2021-07-02 DIAGNOSIS — Z13820 Encounter for screening for osteoporosis: Secondary | ICD-10-CM | POA: Diagnosis not present

## 2021-07-02 DIAGNOSIS — M85851 Other specified disorders of bone density and structure, right thigh: Secondary | ICD-10-CM

## 2021-07-05 ENCOUNTER — Encounter: Payer: Self-pay | Admitting: Internal Medicine

## 2021-07-05 DIAGNOSIS — M8580 Other specified disorders of bone density and structure, unspecified site: Secondary | ICD-10-CM | POA: Insufficient documentation

## 2021-07-07 ENCOUNTER — Encounter (HOSPITAL_COMMUNITY): Payer: Self-pay

## 2021-07-07 ENCOUNTER — Other Ambulatory Visit: Payer: Self-pay

## 2021-07-07 ENCOUNTER — Ambulatory Visit (HOSPITAL_COMMUNITY)
Admission: RE | Admit: 2021-07-07 | Discharge: 2021-07-07 | Disposition: A | Discharge status: Home / Self Care | Payer: Medicare Other | Source: Ambulatory Visit | Attending: Gastroenterology | Admitting: Gastroenterology

## 2021-07-07 DIAGNOSIS — K625 Hemorrhage of anus and rectum: Secondary | ICD-10-CM | POA: Insufficient documentation

## 2021-07-07 DIAGNOSIS — K439 Ventral hernia without obstruction or gangrene: Secondary | ICD-10-CM | POA: Diagnosis not present

## 2021-07-07 DIAGNOSIS — Z86010 Personal history of colonic polyps: Secondary | ICD-10-CM | POA: Insufficient documentation

## 2021-07-07 DIAGNOSIS — I700 Atherosclerosis of aorta: Secondary | ICD-10-CM | POA: Diagnosis not present

## 2021-07-07 DIAGNOSIS — R718 Other abnormality of red blood cells: Secondary | ICD-10-CM | POA: Diagnosis not present

## 2021-07-07 DIAGNOSIS — R110 Nausea: Secondary | ICD-10-CM | POA: Insufficient documentation

## 2021-07-07 DIAGNOSIS — R109 Unspecified abdominal pain: Secondary | ICD-10-CM | POA: Insufficient documentation

## 2021-07-07 DIAGNOSIS — K449 Diaphragmatic hernia without obstruction or gangrene: Secondary | ICD-10-CM | POA: Insufficient documentation

## 2021-07-07 DIAGNOSIS — R160 Hepatomegaly, not elsewhere classified: Secondary | ICD-10-CM | POA: Diagnosis not present

## 2021-07-07 MED ORDER — IOHEXOL 300 MG/ML  SOLN
100.0000 mL | Freq: Once | INTRAMUSCULAR | Status: AC | PRN
  Administered 2021-07-07: 100 mL via INTRAVENOUS

## 2021-07-07 MED ORDER — SODIUM CHLORIDE (PF) 0.9 % IJ SOLN
INTRAMUSCULAR | Status: AC
  Filled 2021-07-07: qty 50

## 2021-07-14 ENCOUNTER — Ambulatory Visit: Payer: Self-pay | Admitting: Surgery

## 2021-07-14 DIAGNOSIS — K449 Diaphragmatic hernia without obstruction or gangrene: Secondary | ICD-10-CM | POA: Diagnosis not present

## 2021-07-14 DIAGNOSIS — E119 Type 2 diabetes mellitus without complications: Secondary | ICD-10-CM | POA: Diagnosis not present

## 2021-07-14 DIAGNOSIS — K432 Incisional hernia without obstruction or gangrene: Secondary | ICD-10-CM | POA: Diagnosis not present

## 2021-07-14 DIAGNOSIS — R1033 Periumbilical pain: Secondary | ICD-10-CM | POA: Diagnosis not present

## 2021-07-18 ENCOUNTER — Encounter: Payer: Self-pay | Admitting: Gastroenterology

## 2021-07-21 ENCOUNTER — Other Ambulatory Visit: Payer: Self-pay | Admitting: Internal Medicine

## 2021-09-03 NOTE — Progress Notes (Addendum)
Anesthesia Review: ? ?PCP: Celso Amy  ?Cardiologist : none  ?Chest x-ray : ?EKG : 09/08/21  ?Echo : ?Stress test: ?Cardiac Cath :  ?Activity level: can doa  flight of stiars without difficulty  ?Sleep Study/ CPAP : none  ?Fasting Blood Sugar :      / Checks Blood Sugar -- times a day:   ?Blood Thinner/ Instructions /Last Dose: ?ASA / Instructions/ Last Dose :   ?DM- type2 checks glucose ever other day  ?Hgba1c-  09/08/21-  6.7  ?

## 2021-09-03 NOTE — Progress Notes (Signed)
DUE TO COVID-19 ONLY  2  VISITOR IS ALLOWED TO COME WITH YOU AND STAY IN THE WAITING ROOM ONLY DURING PRE OP AND PROCEDURE DAY OF SURGERY.   4  VISITOR  MAY VISIT WITH YOU AFTER SURGERY IN YOUR PRIVATE ROOM DURING VISITING HOURS ONLY! ?YOU MAY HAVE ONE PERSON SPEND THE NITE WITH YOU IN YOUR ROOM AFTER SURGERY.   ? ? Your procedure is scheduled on:  ?    09/17/21  ? Report to [facility] Main  Entrance ? ? Report to admitting at    0615              AM ?DO NOT BRING INSURANCE CARD, PICTURE ID OR WALLET DAY OF SURGERY.  ?  ? ? Call this number if you have problems the morning of surgery [phone]  ? ? REMEMBER: NO  SOLID FOODS , CANDY, GUM OR MINTS AFTER MIDNITE THE NITE BEFORE SURGERY .       Marland Kitchen CLEAR LIQUIDS UNTIL      0530am           DAY OF SURGERY.      PLEASE FINISH  G2 Lower sugar DRINK PER SURGEON ORDER  WHICH NEEDS TO BE COMPLETED AT         MORNING OF SURGERY.   0530am  ? ? ? ? ?CLEAR LIQUID DIET ? ? ?Foods Allowed      ?WATER ?BLACK COFFEE ( SUGAR OK, NO MILK, CREAM OR CREAMER) REGULAR AND DECAF  ?TEA ( SUGAR OK NO MILK, CREAM, OR CREAMER) REGULAR AND DECAF  ?PLAIN JELLO ( NO RED)  ?FRUIT ICES ( NO RED, NO FRUIT PULP)  ?POPSICLES ( NO RED)  ?JUICE- APPLE, WHITE GRAPE AND WHITE CRANBERRY  ?SPORT DRINK LIKE GATORADE ( NO RED)  ?CLEAR BROTH ( VEGETABLE , CHICKEN OR BEEF)                                                               ? ?    ? ?BRUSH YOUR TEETH MORNING OF SURGERY AND RINSE YOUR MOUTH OUT, NO CHEWING GUM CANDY OR MINTS. ?  ? ? Take these medicines the morning of surgery with A SIP OF WATER:  none  ? ? ?DO NOT TAKE ANY DIABETIC MEDICATIONS DAY OF YOUR SURGERY ?                  ?            You may not have any metal on your body including hair pins and  ?            piercings  Do not wear jewelry, make-up, lotions, powders or perfumes, deodorant ?            Do not wear nail polish on your fingernails.   ?           IF YOU ARE A FEMALE AND WANT TO SHAVE UNDER ARMS OR LEGS PRIOR TO SURGERY  YOU MUST DO SO AT LEAST 48 HOURS PRIOR TO SURGERY.  ?            Men may shave face and neck. ? ? Do not bring valuables to the hospital. [place] NOT ?  RESPONSIBLE   FOR VALUABLES. ? Contacts, dentures or bridgework may not be worn into surgery. ? Leave suitcase in the car. After surgery it may be brought to your room. ? ?  ? Patients discharged the day of surgery will not be allowed to drive home. IF YOU ARE HAVING SURGERY AND GOING HOME THE SAME DAY, YOU MUST HAVE AN ADULT TO DRIVE YOU HOME AND BE WITH YOU FOR 24 HOURS. YOU MAY GO HOME BY TAXI OR UBER OR ORTHERWISE, BUT AN ADULT MUST ACCOMPANY YOU HOME AND STAY WITH YOU FOR 24 HOURS. ?  ? ?            Please read over the following fact sheets you were given: ?_____________________________________________________________________ ? ?[place] - Preparing for Surgery ?Before surgery, you can play an important role.  Because skin is not sterile, your skin needs to be as free of germs as possible.  You can reduce the number of germs on your skin by washing with CHG (chlorahexidine gluconate) soap before surgery.  CHG is an antiseptic cleaner which kills germs and bonds with the skin to continue killing germs even after washing. ?Please DO NOT use if you have an allergy to CHG or antibacterial soaps.  If your skin becomes reddened/irritated stop using the CHG and inform your nurse when you arrive at Short Stay. ?Do not shave (including legs and underarms) for at least 48 hours prior to the first CHG shower.  You may shave your face/neck. ?Please follow these instructions carefully: ? 1.  Shower with CHG Soap the night before surgery and the  morning of Surgery. ? 2.  If you choose to wash your hair, wash your hair first as usual with your  normal  shampoo. ? 3.  After you shampoo, rinse your hair and body thoroughly to remove the  shampoo.                           4.  Use CHG as you would any other liquid soap.  You can apply chg directly  to the  skin and wash  ?                     Gently with a scrungie or clean washcloth. ? 5.  Apply the CHG Soap to your body ONLY FROM THE NECK DOWN.   Do not use on face/ open      ?                     Wound or open sores. Avoid contact with eyes, ears mouth and genitals (private parts).  ?                     Production manager,  Genitals (private parts) with your normal soap. ?            6.  Wash thoroughly, paying special attention to the area where your surgery  will be performed. ? 7.  Thoroughly rinse your body with warm water from the neck down. ? 8.  DO NOT shower/wash with your normal soap after using and rinsing off  the CHG Soap. ?               9.  Pat yourself dry with a clean towel. ?           10.  Wear clean pajamas. ?  11.  Place clean sheets on your bed the night of your first shower and do not  sleep with pets. ?Day of Surgery : ?Do not apply any lotions/deodorants the morning of surgery.  Please wear clean clothes to the hospital/surgery center. ? ?FAILURE TO FOLLOW THESE INSTRUCTIONS MAY RESULT IN THE CANCELLATION OF YOUR SURGERY ?PATIENT SIGNATURE_________________________________ ? ?NURSE SIGNATURE__________________________________ ? ?________________________________________________________________________  ? ? ?           ?

## 2021-09-08 ENCOUNTER — Encounter (HOSPITAL_COMMUNITY): Payer: Self-pay

## 2021-09-08 ENCOUNTER — Encounter (HOSPITAL_COMMUNITY)
Admission: RE | Admit: 2021-09-08 | Discharge: 2021-09-08 | Disposition: A | Discharge status: Home / Self Care | Payer: Medicare Other | Source: Ambulatory Visit | Attending: Surgery | Admitting: Surgery

## 2021-09-08 ENCOUNTER — Other Ambulatory Visit: Payer: Self-pay

## 2021-09-08 ENCOUNTER — Encounter: Payer: Medicare Other | Admitting: Gastroenterology

## 2021-09-08 VITALS — BP 137/74 | HR 77 | Temp 98.4°F | Resp 16 | Ht 61.0 in | Wt 171.0 lb

## 2021-09-08 DIAGNOSIS — E119 Type 2 diabetes mellitus without complications: Secondary | ICD-10-CM | POA: Insufficient documentation

## 2021-09-08 DIAGNOSIS — Z01818 Encounter for other preprocedural examination: Secondary | ICD-10-CM | POA: Diagnosis not present

## 2021-09-08 HISTORY — DX: Other specified postprocedural states: Z98.890

## 2021-09-08 HISTORY — DX: Nausea with vomiting, unspecified: R11.2

## 2021-09-08 HISTORY — DX: Unspecified osteoarthritis, unspecified site: M19.90

## 2021-09-08 LAB — BASIC METABOLIC PANEL
Anion gap: 8 (ref 5–15)
BUN: 16 mg/dL (ref 8–23)
CO2: 27 mmol/L (ref 22–32)
Calcium: 9.7 mg/dL (ref 8.9–10.3)
Chloride: 102 mmol/L (ref 98–111)
Creatinine, Ser: 0.84 mg/dL (ref 0.44–1.00)
GFR, Estimated: >60 mL/min (ref >60)
Glucose, Bld: 146 mg/dL — ABNORMAL HIGH (ref 70–99)
Potassium: 4 mmol/L (ref 3.5–5.1)
Sodium: 137 mmol/L (ref 135–145)

## 2021-09-08 LAB — GLUCOSE, CAPILLARY: Glucose-Capillary: 143 mg/dL — ABNORMAL HIGH (ref 70–99)

## 2021-09-08 LAB — CBC
HCT: 41 % (ref 36.0–46.0)
Hemoglobin: 13.6 g/dL (ref 12.0–15.0)
MCH: 26.2 pg (ref 26.0–34.0)
MCHC: 33.2 g/dL (ref 30.0–36.0)
MCV: 78.8 fL — ABNORMAL LOW (ref 80.0–100.0)
Platelets: 324 10*3/uL (ref 150–400)
RBC: 5.2 MIL/uL — ABNORMAL HIGH (ref 3.87–5.11)
RDW: 13.5 % (ref 11.5–15.5)
WBC: 5.7 10*3/uL (ref 4.0–10.5)
nRBC: 0 % (ref 0.0–0.2)

## 2021-09-08 LAB — HEMOGLOBIN A1C
Hgb A1c MFr Bld: 6.7 % — ABNORMAL HIGH (ref 4.8–5.6)
Mean Plasma Glucose: 145.59 mg/dL

## 2021-09-15 ENCOUNTER — Telehealth: Payer: Self-pay

## 2021-09-15 NOTE — Progress Notes (Signed)
? ? ?Chronic Care Management ?Pharmacy Assistant  ? ?Name: Heather [name]  MRN: [id number] DOB: [birth year] ? ? ? ?Reason for Encounter: Disease State-General Adherence Call ?  ? ?Recent office visits:  ?None since the last coordination call with Clinical pharmacist ? ?Recent consult visits:  ?06/30/21 Yetta Flock, MD-Gastroenterology (Rectal Bleeding) Blood work ordered; Medication changes: Methylcellulose daily, ondansetron 4 mg ? ?Hospital visits:  ?None since the last coordination call with Clinical pharmacist ? ?Medications: ?Outpatient Encounter Medications as of 09/15/2021  ?Medication Sig  ? atorvastatin (LIPITOR) 10 MG tablet TAKE 1 TABLET BY MOUTH EVERY DAY (Patient not taking: Reported on 09/03/2021)  ? Blood Glucose Monitoring Suppl (ONE TOUCH ULTRA 2) w/Device KIT 1 each by Does not apply route 4 (four) times daily. Use device to monitor glucose levels 4 times per day; E11.9  ? diclofenac Sodium (VOLTAREN) 1 % GEL APPLY 4 GRAMS TO AFFECTED JOINTS 4 TIMES A DAY (Patient taking differently: Apply 4 g topically daily as needed (pain).)  ? Dulaglutide (TRULICITY) 4.5 IN/8.6VE SOPN Inject 4.5 mg as directed once a week. (Patient taking differently: Inject 4.5 mg as directed every Friday.)  ? famotidine (PEPCID) 10 MG tablet Take 10 mg by mouth daily as needed for heartburn or indigestion.  ? fluticasone (FLONASE) 50 MCG/ACT nasal spray Place 1 spray into both nostrils daily as needed for allergies or rhinitis.  ? gabapentin (NEURONTIN) 600 MG tablet TAKE 2 TABLETS (1,200 MG TOTAL) BY MOUTH AT BEDTIME.  ? glucose blood (ONETOUCH ULTRA) test strip Test once daily. DX E11.42  ? hydrochlorothiazide (HYDRODIURIL) 25 MG tablet TAKE 1 TABLET (25 MG TOTAL) BY MOUTH DAILY.  ? ibuprofen (ADVIL) 200 MG tablet Take 400 mg by mouth every 8 (eight) hours as needed for moderate pain.  ? Lancets (ONETOUCH DELICA PLUS [id number]) MISC USE TO TEST BLOOD SUGARS  ONCE DAILY  ? losartan (COZAAR) 100 MG tablet TAKE 1 TABLET  BY MOUTH EVERY DAY  ? ondansetron (ZOFRAN-ODT) 4 MG disintegrating tablet Take 1 tablet (4 mg total) by mouth every 8 (eight) hours as needed for nausea or vomiting.  ? ?No facility-administered encounter medications on file as of 09/15/2021.  ? ?Contacted [name] for General Review Call ? ? ?Chart Review: ? ?Have there been any documented new, changed, or discontinued medications since last visit? No (If yes, include name, dose, frequency, date) ?Has there been any documented recent hospitalizations or ED visits since last visit with Clinical Pharmacist? No ?Brief Summary (including medication and/or Diagnosis changes): ? ? ?Adherence Review: ? ?Does the Clinical Pharmacist Assistant have access to adherence rates? Yes ?Adherence rates for STAR metric medications (List medication(s)/day supply/ last 2 fill dates). ?Adherence rates for medications indicated for disease state being reviewed (List medication(s)/day supply/ last 2 fill dates). ?Does the patient have >5 day gap between last estimated fill dates for any of the above medications or other medication gaps? No ?Reason for medication gaps. ? ? ?Disease State Questions: ? ?Able to connect with Patient? Yes ?Did patient have any problems with their health recently? Yes ?Note problems and Concerns:Patient states that she is experiencing some stress because she is having to take care of her brother who is ill and herself ?Have you had any admissions or emergency room visits or worsening of your condition(s) since last visit? No ?Details of ED visit, hospital visit and/or worsening condition(s): ?Have you had any visits with new specialists or providers since your last visit? Yes ?Explain:Patient states that she  was having some bleeding in rectum and some abdominal pain for a while ?Have you had any new health care problem(s) since your last visit? No ?New problem(s) reported: ?Have you run out of any of your medications since you last spoke with clinical  pharmacist? No ?What caused you to run out of your medications? ?Are there any medications you are not taking as prescribed? No ?What kept you from taking your medications as prescribed? ?Are you having any issues or side effects with your medications? Yes ?Note of issues or side effects:Patient states that she feels nausea when she takes Trulicity ?Do you have any other health concerns or questions you want to discuss with your Clinical Pharmacist before your next visit? No ?Note additional concerns and questions from Patient. ?Are there any health concerns that you feel we can do a better job addressing? No ?Note Patient's response. ?Are you having any problems with any of the following since the last visit: (select all that apply) ? None ? Details: ?12. Any falls since last visit? No ? Details: ?13. Any increased or uncontrolled pain since last visit? No ? Details: ?14. Next visit Type: telephone ?      Visit with:Clinical Pharmacist  ?       Date:12/09/21 ?       Time:1:45 pm ? ?15. Additional Details? Yes, patient states that she is having surgery tomorrow hor hernia ?   ?Care Gaps: ?Colonoscopy-08/16/17 ?Diabetic Foot Exam-04/25/20 ?Mammogram-01/17/21 ?Ophthalmology-11/29/20 ?Dexa Scan - 07/02/21 ?Annual Well Visit - 04/29/21 ?Micro albumin-NA ?Hemoglobin A1c- 09/08/21 ? ?Star Rating Drugs: ?Losartan 100 mg-last fill 08/02/21 90 ds ?Atorvastatin 10 mg-last fill 08/02/21 90 ds ?Dulaglutide 4.5 mg-last fill 07/22/21 84 ds ? ?Ethelene Hal ?Clinical Pharmacist Assistant ?[phone]  ?

## 2021-09-16 NOTE — Anesthesia Preprocedure Evaluation (Addendum)
Anesthesia Evaluation  ?Patient identified by MRN, date of birth, ID band ?Patient awake ? ? ? ?Reviewed: ?Allergy & Precautions, NPO status , Patient's Chart, lab work & pertinent test results ? ?History of Anesthesia Complications ?(+) PONV and history of anesthetic complications ? ?Airway ?Mallampati: I ? ?TM Distance: >3 FB ?Neck ROM: Full ? ? ? Dental ?no notable dental hx. ?(+) Teeth Intact, Dental Advisory Given, Caps,  ?  ?Pulmonary ?shortness of breath, former smoker,  ?  ?Pulmonary exam normal ?breath sounds clear to auscultation ? ? ? ? ? ? Cardiovascular ?hypertension, Pt. on medications ?Normal cardiovascular exam+ Valvular Problems/Murmurs  ?Rhythm:Regular Rate:Normal ? ? ?  ?Neuro/Psych ? Headaches, negative psych ROS  ? GI/Hepatic ?Neg liver ROS, hiatal hernia, GERD  Medicated,  ?Endo/Other  ?diabetes, Poorly Controlled, Type 2, Oral Hypoglycemic Agents ? Renal/GU ?negative Renal ROS  ? ?  ?Musculoskeletal ? ?(+) Arthritis , Osteoarthritis,   ? Abdominal ?(+) + obese,   ?Peds ? Hematology ? ?(+) Blood dyscrasia, anemia ,   ?Anesthesia Other Findings ? ? Reproductive/Obstetrics ?negative OB ROS ? ?  ? ? ? ? ? ? ? ? ? ? ? ? ? ?  ?  ? ? ? ? ? ? ? ?Anesthesia Physical ? ?Anesthesia Plan ? ?ASA: 3 ? ?Anesthesia Plan: General  ? ?Post-op Pain Management: Minimal or no pain anticipated  ? ?Induction: Intravenous ? ?PONV Risk Score and Plan: 3 and Ondansetron, Dexamethasone, Treatment may vary due to age or medical condition and Propofol infusion ? ?Airway Management Planned: Oral ETT and LMA ? ?Additional Equipment: None ? ?Intra-op Plan:  ? ?Post-operative Plan: Extubation in OR ? ?Informed Consent: I have reviewed the patients History and Physical, chart, labs and discussed the procedure including the risks, benefits and alternatives for the proposed anesthesia with the patient or authorized representative who has indicated his/her understanding and acceptance.   ? ? ? ?Dental advisory given ? ?Plan Discussed with: CRNA and Anesthesiologist ? ?Anesthesia Plan Comments:   ? ? ? ? ? ?Anesthesia Quick Evaluation ? ?

## 2021-09-17 ENCOUNTER — Encounter (HOSPITAL_COMMUNITY): Payer: Self-pay | Admitting: Surgery

## 2021-09-17 ENCOUNTER — Encounter (HOSPITAL_COMMUNITY)
Admission: RE | Disposition: A | Discharge status: Home / Self Care | Payer: Self-pay | Source: Ambulatory Visit | Attending: Surgery

## 2021-09-17 ENCOUNTER — Other Ambulatory Visit: Payer: Self-pay

## 2021-09-17 ENCOUNTER — Observation Stay (HOSPITAL_COMMUNITY)
Admission: RE | Admit: 2021-09-17 | Discharge: 2021-09-18 | Disposition: A | Discharge status: Home / Self Care | Payer: Medicare Other | Source: Ambulatory Visit | Attending: Surgery | Admitting: Surgery

## 2021-09-17 ENCOUNTER — Ambulatory Visit (HOSPITAL_COMMUNITY): Payer: Medicare Other | Admitting: Certified Registered Nurse Anesthetist

## 2021-09-17 ENCOUNTER — Ambulatory Visit (HOSPITAL_BASED_OUTPATIENT_CLINIC_OR_DEPARTMENT_OTHER): Payer: Medicare Other | Admitting: Certified Registered Nurse Anesthetist

## 2021-09-17 DIAGNOSIS — E669 Obesity, unspecified: Secondary | ICD-10-CM | POA: Insufficient documentation

## 2021-09-17 DIAGNOSIS — Z86718 Personal history of other venous thrombosis and embolism: Secondary | ICD-10-CM | POA: Insufficient documentation

## 2021-09-17 DIAGNOSIS — Z87891 Personal history of nicotine dependence: Secondary | ICD-10-CM | POA: Insufficient documentation

## 2021-09-17 DIAGNOSIS — E1165 Type 2 diabetes mellitus with hyperglycemia: Secondary | ICD-10-CM | POA: Diagnosis not present

## 2021-09-17 DIAGNOSIS — I10 Essential (primary) hypertension: Secondary | ICD-10-CM | POA: Diagnosis not present

## 2021-09-17 DIAGNOSIS — K430 Incisional hernia with obstruction, without gangrene: Secondary | ICD-10-CM | POA: Diagnosis not present

## 2021-09-17 DIAGNOSIS — K436 Other and unspecified ventral hernia with obstruction, without gangrene: Secondary | ICD-10-CM | POA: Diagnosis not present

## 2021-09-17 DIAGNOSIS — E119 Type 2 diabetes mellitus without complications: Secondary | ICD-10-CM

## 2021-09-17 DIAGNOSIS — Z7984 Long term (current) use of oral hypoglycemic drugs: Secondary | ICD-10-CM | POA: Diagnosis not present

## 2021-09-17 DIAGNOSIS — E1140 Type 2 diabetes mellitus with diabetic neuropathy, unspecified: Secondary | ICD-10-CM

## 2021-09-17 DIAGNOSIS — Z79899 Other long term (current) drug therapy: Secondary | ICD-10-CM | POA: Insufficient documentation

## 2021-09-17 DIAGNOSIS — K219 Gastro-esophageal reflux disease without esophagitis: Secondary | ICD-10-CM | POA: Diagnosis not present

## 2021-09-17 DIAGNOSIS — Z794 Long term (current) use of insulin: Secondary | ICD-10-CM | POA: Diagnosis not present

## 2021-09-17 DIAGNOSIS — K449 Diaphragmatic hernia without obstruction or gangrene: Secondary | ICD-10-CM

## 2021-09-17 DIAGNOSIS — E66811 Obesity, class 1: Secondary | ICD-10-CM | POA: Diagnosis present

## 2021-09-17 DIAGNOSIS — E1142 Type 2 diabetes mellitus with diabetic polyneuropathy: Secondary | ICD-10-CM | POA: Insufficient documentation

## 2021-09-17 HISTORY — PX: VENTRAL HERNIA REPAIR: SHX424

## 2021-09-17 HISTORY — PX: LYSIS OF ADHESION: SHX5961

## 2021-09-17 HISTORY — PX: INSERTION OF MESH: SHX5868

## 2021-09-17 LAB — GLUCOSE, CAPILLARY
Glucose-Capillary: 129 mg/dL — ABNORMAL HIGH (ref 70–99)
Glucose-Capillary: 204 mg/dL — ABNORMAL HIGH (ref 70–99)

## 2021-09-17 SURGERY — REPAIR, HERNIA, VENTRAL, LAPAROSCOPIC
Anesthesia: General | Site: Abdomen

## 2021-09-17 MED ORDER — ROCURONIUM BROMIDE 10 MG/ML (PF) SYRINGE
PREFILLED_SYRINGE | INTRAVENOUS | Status: AC
  Filled 2021-09-17: qty 10

## 2021-09-17 MED ORDER — SODIUM CHLORIDE 0.9 % IV SOLN: 250.0000 mL | INTRAVENOUS | Status: DC | PRN

## 2021-09-17 MED ORDER — BUPIVACAINE-EPINEPHRINE (PF) 0.25% -1:200000 IJ SOLN
INTRAMUSCULAR | Status: AC
  Filled 2021-09-17: qty 60

## 2021-09-17 MED ORDER — ONDANSETRON 4 MG PO TBDP
4.0000 mg | ORAL_TABLET | Freq: Four times a day (QID) | ORAL | Status: DC | PRN
Start: 2021-09-17 — End: 2021-09-18

## 2021-09-17 MED ORDER — TRAMADOL HCL 50 MG PO TABS
50.0000 mg | ORAL_TABLET | Freq: Four times a day (QID) | ORAL | Status: DC | PRN
  Administered 2021-09-17: 100 mg via ORAL
  Filled 2021-09-17: qty 2

## 2021-09-17 MED ORDER — ORAL CARE MOUTH RINSE: 15.0000 mL | Freq: Once | OROMUCOSAL | Status: AC

## 2021-09-17 MED ORDER — IBUPROFEN 400 MG PO TABS: 400.0000 mg | ORAL_TABLET | Freq: Three times a day (TID) | ORAL | Status: DC | PRN

## 2021-09-17 MED ORDER — 0.9 % SODIUM CHLORIDE (POUR BTL) OPTIME
TOPICAL | Status: DC | PRN
  Administered 2021-09-17: 1000 mL

## 2021-09-17 MED ORDER — GABAPENTIN 600 MG PO TABS: 1200.0000 mg | ORAL_TABLET | Freq: Every day | ORAL | Status: DC

## 2021-09-17 MED ORDER — STERILE WATER FOR IRRIGATION IR SOLN
Status: DC | PRN
  Administered 2021-09-17: 1000 mL

## 2021-09-17 MED ORDER — LIP MEDEX EX OINT
1.0000 "application " | TOPICAL_OINTMENT | Freq: Two times a day (BID) | CUTANEOUS | Status: DC
  Administered 2021-09-17 – 2021-09-18 (×2): 1 via TOPICAL
  Filled 2021-09-17: qty 7

## 2021-09-17 MED ORDER — FENTANYL CITRATE PF 50 MCG/ML IJ SOSY
PREFILLED_SYRINGE | INTRAMUSCULAR | Status: AC
  Administered 2021-09-17: 25 ug via INTRAVENOUS
  Filled 2021-09-17: qty 2

## 2021-09-17 MED ORDER — ACETAMINOPHEN 500 MG PO TABS
1000.0000 mg | ORAL_TABLET | ORAL | Status: AC
  Administered 2021-09-17: 1000 mg via ORAL
  Filled 2021-09-17: qty 2

## 2021-09-17 MED ORDER — LACTATED RINGERS IV BOLUS
1000.0000 mL | Freq: Three times a day (TID) | INTRAVENOUS | Status: DC | PRN
  Administered 2021-09-17: 1000 mL via INTRAVENOUS

## 2021-09-17 MED ORDER — GLYCOPYRROLATE 0.2 MG/ML IJ SOLN
INTRAMUSCULAR | Status: DC | PRN
  Administered 2021-09-17: .2 mg via INTRAVENOUS

## 2021-09-17 MED ORDER — FENTANYL CITRATE (PF) 250 MCG/5ML IJ SOLN
INTRAMUSCULAR | Status: AC
  Filled 2021-09-17: qty 5

## 2021-09-17 MED ORDER — SODIUM CHLORIDE 0.9% FLUSH
3.0000 mL | Freq: Two times a day (BID) | INTRAVENOUS | Status: DC
  Administered 2021-09-17 – 2021-09-18 (×2): 3 mL via INTRAVENOUS

## 2021-09-17 MED ORDER — LIDOCAINE 2% (20 MG/ML) 5 ML SYRINGE
INTRAMUSCULAR | Status: DC | PRN
Start: 2021-09-17 — End: 2021-09-17
  Administered 2021-09-17: 100 mg via INTRAVENOUS

## 2021-09-17 MED ORDER — ACETAMINOPHEN 500 MG PO TABS
1000.0000 mg | ORAL_TABLET | Freq: Four times a day (QID) | ORAL | Status: DC
  Administered 2021-09-17 – 2021-09-18 (×4): 1000 mg via ORAL
  Filled 2021-09-17 (×4): qty 2

## 2021-09-17 MED ORDER — HYDROCHLOROTHIAZIDE 25 MG PO TABS
25.0000 mg | ORAL_TABLET | Freq: Every day | ORAL | Status: DC
Start: 2021-09-17 — End: 2021-09-18
  Administered 2021-09-17 – 2021-09-18 (×2): 25 mg via ORAL
  Filled 2021-09-17 (×2): qty 1

## 2021-09-17 MED ORDER — FENTANYL CITRATE PF 50 MCG/ML IJ SOSY
25.0000 ug | PREFILLED_SYRINGE | INTRAMUSCULAR | Status: DC | PRN
  Administered 2021-09-17: 50 ug via INTRAVENOUS

## 2021-09-17 MED ORDER — SODIUM CHLORIDE 0.9 % IV SOLN: Freq: Three times a day (TID) | INTRAVENOUS | Status: DC | PRN

## 2021-09-17 MED ORDER — BUPIVACAINE LIPOSOME 1.3 % IJ SUSP: 20.0000 mL | Freq: Once | INTRAMUSCULAR | Status: DC

## 2021-09-17 MED ORDER — OXYCODONE HCL 5 MG PO TABS: 5.0000 mg | ORAL_TABLET | Freq: Once | ORAL | Status: DC | PRN

## 2021-09-17 MED ORDER — HYDROMORPHONE HCL 1 MG/ML IJ SOLN
0.5000 mg | INTRAMUSCULAR | Status: DC | PRN
  Administered 2021-09-18: 1 mg via INTRAVENOUS
  Filled 2021-09-17: qty 1

## 2021-09-17 MED ORDER — OXYCODONE HCL 5 MG PO TABS
5.0000 mg | ORAL_TABLET | ORAL | Status: DC | PRN
  Administered 2021-09-17 – 2021-09-18 (×2): 10 mg via ORAL
  Filled 2021-09-17 (×2): qty 2

## 2021-09-17 MED ORDER — ONDANSETRON HCL 4 MG/2ML IJ SOLN: 4.0000 mg | Freq: Once | INTRAMUSCULAR | Status: DC | PRN

## 2021-09-17 MED ORDER — LACTATED RINGERS IV SOLN: INTRAVENOUS | Status: DC

## 2021-09-17 MED ORDER — ONDANSETRON HCL 4 MG/2ML IJ SOLN
INTRAMUSCULAR | Status: DC | PRN
  Administered 2021-09-17: 4 mg via INTRAVENOUS

## 2021-09-17 MED ORDER — DIPHENHYDRAMINE HCL 12.5 MG/5ML PO ELIX: 12.5000 mg | ORAL_SOLUTION | Freq: Four times a day (QID) | ORAL | Status: DC | PRN

## 2021-09-17 MED ORDER — DIPHENHYDRAMINE HCL 50 MG/ML IJ SOLN: 12.5000 mg | Freq: Four times a day (QID) | INTRAMUSCULAR | Status: DC | PRN

## 2021-09-17 MED ORDER — CALCIUM POLYCARBOPHIL 625 MG PO TABS
625.0000 mg | ORAL_TABLET | Freq: Two times a day (BID) | ORAL | Status: DC
  Administered 2021-09-17 – 2021-09-18 (×2): 625 mg via ORAL
  Filled 2021-09-17 (×2): qty 1

## 2021-09-17 MED ORDER — EPHEDRINE SULFATE-NACL 50-0.9 MG/10ML-% IV SOSY
PREFILLED_SYRINGE | INTRAVENOUS | Status: DC | PRN
  Administered 2021-09-17 (×3): 5 mg via INTRAVENOUS

## 2021-09-17 MED ORDER — PROCHLORPERAZINE EDISYLATE 10 MG/2ML IJ SOLN: 5.0000 mg | Freq: Four times a day (QID) | INTRAMUSCULAR | Status: DC | PRN

## 2021-09-17 MED ORDER — ATORVASTATIN CALCIUM 10 MG PO TABS
10.0000 mg | ORAL_TABLET | Freq: Every day | ORAL | Status: DC
  Administered 2021-09-17 – 2021-09-18 (×2): 10 mg via ORAL
  Filled 2021-09-17 (×2): qty 1

## 2021-09-17 MED ORDER — PHENYLEPHRINE HCL-NACL 20-0.9 MG/250ML-% IV SOLN
INTRAVENOUS | Status: DC | PRN
Start: 2021-09-17 — End: 2021-09-17
  Administered 2021-09-17: 50 ug/min via INTRAVENOUS

## 2021-09-17 MED ORDER — FLUTICASONE PROPIONATE 50 MCG/ACT NA SUSP
1.0000 | Freq: Every day | NASAL | Status: DC | PRN
  Filled 2021-09-17: qty 16

## 2021-09-17 MED ORDER — CHLORHEXIDINE GLUCONATE CLOTH 2 % EX PADS: 6.0000 | MEDICATED_PAD | Freq: Once | CUTANEOUS | Status: DC

## 2021-09-17 MED ORDER — GLYCOPYRROLATE 0.2 MG/ML IJ SOLN
INTRAMUSCULAR | Status: AC
  Filled 2021-09-17: qty 1

## 2021-09-17 MED ORDER — OXYCODONE HCL 5 MG/5ML PO SOLN: 5.0000 mg | Freq: Once | ORAL | Status: DC | PRN

## 2021-09-17 MED ORDER — ENOXAPARIN SODIUM 40 MG/0.4ML IJ SOSY
40.0000 mg | PREFILLED_SYRINGE | INTRAMUSCULAR | Status: DC
  Filled 2021-09-17: qty 0.4

## 2021-09-17 MED ORDER — GABAPENTIN 300 MG PO CAPS
300.0000 mg | ORAL_CAPSULE | ORAL | Status: AC
  Administered 2021-09-17: 300 mg via ORAL
  Filled 2021-09-17: qty 1

## 2021-09-17 MED ORDER — CHLORHEXIDINE GLUCONATE 0.12 % MT SOLN
15.0000 mL | Freq: Once | OROMUCOSAL | Status: AC
  Administered 2021-09-17: 15 mL via OROMUCOSAL

## 2021-09-17 MED ORDER — PROPOFOL 10 MG/ML IV BOLUS
INTRAVENOUS | Status: DC | PRN
  Administered 2021-09-17: 100 mg via INTRAVENOUS

## 2021-09-17 MED ORDER — ENALAPRILAT 1.25 MG/ML IV SOLN
0.6250 mg | Freq: Four times a day (QID) | INTRAVENOUS | Status: DC | PRN
  Filled 2021-09-17: qty 1

## 2021-09-17 MED ORDER — SODIUM CHLORIDE 0.9% FLUSH: 3.0000 mL | INTRAVENOUS | Status: DC | PRN

## 2021-09-17 MED ORDER — FAMOTIDINE 20 MG PO TABS
20.0000 mg | ORAL_TABLET | Freq: Two times a day (BID) | ORAL | Status: DC
  Administered 2021-09-17 – 2021-09-18 (×3): 20 mg via ORAL
  Filled 2021-09-17 (×3): qty 1

## 2021-09-17 MED ORDER — ROCURONIUM BROMIDE 10 MG/ML (PF) SYRINGE
PREFILLED_SYRINGE | INTRAVENOUS | Status: DC | PRN
  Administered 2021-09-17: 100 mg via INTRAVENOUS
  Administered 2021-09-17: 20 mg via INTRAVENOUS

## 2021-09-17 MED ORDER — MAGNESIUM HYDROXIDE 400 MG/5ML PO SUSP: 30.0000 mL | Freq: Every day | ORAL | Status: DC | PRN

## 2021-09-17 MED ORDER — DICLOFENAC SODIUM 1 % EX GEL
4.0000 g | Freq: Every day | CUTANEOUS | Status: DC | PRN
  Filled 2021-09-17: qty 100

## 2021-09-17 MED ORDER — FENTANYL CITRATE (PF) 100 MCG/2ML IJ SOLN
INTRAMUSCULAR | Status: DC | PRN
  Administered 2021-09-17 (×5): 50 ug via INTRAVENOUS

## 2021-09-17 MED ORDER — ACETAMINOPHEN 325 MG PO TABS: 325.0000 mg | ORAL_TABLET | ORAL | Status: DC | PRN

## 2021-09-17 MED ORDER — LIDOCAINE HCL (PF) 2 % IJ SOLN
INTRAMUSCULAR | Status: AC
  Filled 2021-09-17: qty 5

## 2021-09-17 MED ORDER — ONDANSETRON HCL 4 MG/2ML IJ SOLN
INTRAMUSCULAR | Status: AC
  Filled 2021-09-17: qty 2

## 2021-09-17 MED ORDER — SUGAMMADEX SODIUM 200 MG/2ML IV SOLN
INTRAVENOUS | Status: DC | PRN
  Administered 2021-09-17: 250 mg via INTRAVENOUS

## 2021-09-17 MED ORDER — GABAPENTIN 400 MG PO CAPS
1200.0000 mg | ORAL_CAPSULE | Freq: Every day | ORAL | Status: DC
  Administered 2021-09-17: 1200 mg via ORAL
  Filled 2021-09-17: qty 3

## 2021-09-17 MED ORDER — CEFAZOLIN SODIUM-DEXTROSE 2-4 GM/100ML-% IV SOLN
2.0000 g | INTRAVENOUS | Status: AC
  Administered 2021-09-17: 2 g via INTRAVENOUS
  Filled 2021-09-17: qty 100

## 2021-09-17 MED ORDER — PROCHLORPERAZINE MALEATE 10 MG PO TABS
10.0000 mg | ORAL_TABLET | Freq: Four times a day (QID) | ORAL | Status: DC | PRN
  Filled 2021-09-17: qty 1

## 2021-09-17 MED ORDER — ENSURE PRE-SURGERY PO LIQD
296.0000 mL | Freq: Once | ORAL | Status: DC
Start: 2021-09-17 — End: 2021-09-17
  Filled 2021-09-17: qty 296

## 2021-09-17 MED ORDER — MAGIC MOUTHWASH
15.0000 mL | Freq: Four times a day (QID) | ORAL | Status: DC | PRN
  Filled 2021-09-17: qty 15

## 2021-09-17 MED ORDER — METHOCARBAMOL 500 MG PO TABS: 500.0000 mg | ORAL_TABLET | Freq: Four times a day (QID) | ORAL | Status: DC | PRN

## 2021-09-17 MED ORDER — CEFAZOLIN SODIUM-DEXTROSE 2-4 GM/100ML-% IV SOLN
2.0000 g | Freq: Three times a day (TID) | INTRAVENOUS | Status: AC
  Administered 2021-09-17: 2 g via INTRAVENOUS
  Filled 2021-09-17: qty 100

## 2021-09-17 MED ORDER — EPHEDRINE 5 MG/ML INJ
INTRAVENOUS | Status: AC
  Filled 2021-09-17: qty 5

## 2021-09-17 MED ORDER — BISACODYL 10 MG RE SUPP: 10.0000 mg | Freq: Every day | RECTAL | Status: DC | PRN

## 2021-09-17 MED ORDER — ENSURE PRE-SURGERY PO LIQD
592.0000 mL | Freq: Once | ORAL | Status: DC
  Filled 2021-09-17: qty 592

## 2021-09-17 MED ORDER — PROPOFOL 10 MG/ML IV BOLUS
INTRAVENOUS | Status: AC
  Filled 2021-09-17: qty 20

## 2021-09-17 MED ORDER — MEPERIDINE HCL 50 MG/ML IJ SOLN: 6.2500 mg | INTRAMUSCULAR | Status: DC | PRN

## 2021-09-17 MED ORDER — BUPIVACAINE LIPOSOME 1.3 % IJ SUSP
INTRAMUSCULAR | Status: AC
  Filled 2021-09-17: qty 20

## 2021-09-17 MED ORDER — METOPROLOL TARTRATE 5 MG/5ML IV SOLN: 5.0000 mg | Freq: Four times a day (QID) | INTRAVENOUS | Status: DC | PRN

## 2021-09-17 MED ORDER — ONDANSETRON HCL 4 MG/2ML IJ SOLN: 4.0000 mg | Freq: Four times a day (QID) | INTRAMUSCULAR | Status: DC | PRN

## 2021-09-17 MED ORDER — DEXAMETHASONE SODIUM PHOSPHATE 10 MG/ML IJ SOLN
INTRAMUSCULAR | Status: DC | PRN
  Administered 2021-09-17: 5 mg via INTRAVENOUS

## 2021-09-17 MED ORDER — METHOCARBAMOL 1000 MG/10ML IJ SOLN
1000.0000 mg | Freq: Four times a day (QID) | INTRAMUSCULAR | Status: DC | PRN
  Filled 2021-09-17: qty 10

## 2021-09-17 MED ORDER — ACETAMINOPHEN 160 MG/5ML PO SOLN: 325.0000 mg | ORAL | Status: DC | PRN

## 2021-09-17 MED ORDER — LACTATED RINGERS IR SOLN
Status: DC | PRN
  Administered 2021-09-17: 1000 mL

## 2021-09-17 MED ORDER — SIMETHICONE 80 MG PO CHEW: 40.0000 mg | CHEWABLE_TABLET | Freq: Four times a day (QID) | ORAL | Status: DC | PRN

## 2021-09-17 MED ORDER — DEXAMETHASONE SODIUM PHOSPHATE 10 MG/ML IJ SOLN
INTRAMUSCULAR | Status: AC
  Filled 2021-09-17: qty 1

## 2021-09-17 SURGICAL SUPPLY — 48 items
APL PRP STRL LF DISP 70% ISPRP (MISCELLANEOUS) ×1
APPLIER CLIP 5 13 M/L LIGAMAX5 (MISCELLANEOUS)
APR CLP MED LRG 5 ANG JAW (MISCELLANEOUS)
BAG COUNTER SPONGE SURGICOUNT (BAG) ×1 IMPLANT
BAG SPNG CNTER NS LX DISP (BAG)
BINDER ABDOMINAL 12 ML 46-62 (SOFTGOODS) ×1 IMPLANT
CABLE HIGH FREQUENCY MONO STRZ (ELECTRODE) ×2 IMPLANT
CHLORAPREP W/TINT 26 (MISCELLANEOUS) ×2 IMPLANT
CLIP APPLIE 5 13 M/L LIGAMAX5 (MISCELLANEOUS) IMPLANT
COVER SURGICAL LIGHT HANDLE (MISCELLANEOUS) ×2 IMPLANT
DEVICE SECURE STRAP 25 ABSORB (INSTRUMENTS) ×1 IMPLANT
DEVICE TROCAR PUNCTURE CLOSURE (ENDOMECHANICALS) ×2 IMPLANT
DRAPE WARM FLUID 44X44 (DRAPES) ×2 IMPLANT
DRSG OPSITE POSTOP 4X6 (GAUZE/BANDAGES/DRESSINGS) ×1 IMPLANT
DRSG TEGADERM 2-3/8X2-3/4 SM (GAUZE/BANDAGES/DRESSINGS) ×4 IMPLANT
DRSG TEGADERM 4X4.75 (GAUZE/BANDAGES/DRESSINGS) ×2 IMPLANT
ELECT PENCIL ROCKER SW 15FT (MISCELLANEOUS) ×1 IMPLANT
ELECT REM PT RETURN 15FT ADLT (MISCELLANEOUS) ×2 IMPLANT
GAUZE SPONGE 2X2 8PLY STRL LF (GAUZE/BANDAGES/DRESSINGS) IMPLANT
GLOVE ECLIPSE 8.0 STRL XLNG CF (GLOVE) ×2 IMPLANT
GLOVE INDICATOR 8.0 STRL GRN (GLOVE) ×2 IMPLANT
GOWN STRL REUS W/ TWL XL LVL3 (GOWN DISPOSABLE) ×3 IMPLANT
GOWN STRL REUS W/TWL XL LVL3 (GOWN DISPOSABLE) ×6
IRRIG SUCT STRYKERFLOW 2 WTIP (MISCELLANEOUS) ×2
IRRIGATION SUCT STRKRFLW 2 WTP (MISCELLANEOUS) IMPLANT
KIT BASIN OR (CUSTOM PROCEDURE TRAY) ×2 IMPLANT
KIT TURNOVER KIT A (KITS) ×1 IMPLANT
MARKER SKIN DUAL TIP RULER LAB (MISCELLANEOUS) ×2 IMPLANT
MESH VENTRALIGHT ST 10X13IN (Mesh General) ×1 IMPLANT
NDL SPNL 22GX3.5 QUINCKE BK (NEEDLE) IMPLANT
NEEDLE SPNL 22GX3.5 QUINCKE BK (NEEDLE) ×2 IMPLANT
PAD POSITIONING PINK XL (MISCELLANEOUS) ×2 IMPLANT
PENCIL SMOKE EVACUATOR (MISCELLANEOUS) IMPLANT
SCISSORS LAP 5X35 DISP (ENDOMECHANICALS) ×2 IMPLANT
SET TUBE SMOKE EVAC HIGH FLOW (TUBING) ×2 IMPLANT
SLEEVE ADV FIXATION 5X100MM (TROCAR) ×2 IMPLANT
SPIKE FLUID TRANSFER (MISCELLANEOUS) ×2 IMPLANT
SPONGE GAUZE 2X2 STER 10/PKG (GAUZE/BANDAGES/DRESSINGS) ×1
STRIP CLOSURE SKIN 1/2X4 (GAUZE/BANDAGES/DRESSINGS) ×3 IMPLANT
SUT MNCRL AB 4-0 PS2 18 (SUTURE) ×2 IMPLANT
SUT PDS AB 1 CT1 27 (SUTURE) ×6 IMPLANT
SUT PROLENE 1 CT 1 30 (SUTURE) ×13 IMPLANT
SUT VIC AB 3-0 SH 18 (SUTURE) ×2 IMPLANT
TOWEL OR 17X26 10 PK STRL BLUE (TOWEL DISPOSABLE) ×2 IMPLANT
TRAY LAPAROSCOPIC (CUSTOM PROCEDURE TRAY) ×2 IMPLANT
TROCAR ADV FIXATION 11X100MM (TROCAR) IMPLANT
TROCAR ADV FIXATION 5X100MM (TROCAR) ×2 IMPLANT
TROCAR BLADELESS OPT 5 100 (ENDOMECHANICALS) ×2 IMPLANT

## 2021-09-17 NOTE — Interval H&P Note (Signed)
History and Physical Interval Note: ? ?09/17/2021 ?7:19 AM ? ?Heather [name]  has presented today for surgery, with the diagnosis of [place].  The various methods of treatment have been discussed with the patient and family. After consideration of risks, benefits and other options for treatment, the patient has consented to  Procedure(s): ?LAPAROSCOPIC VENTRAL WALL HERNIA REPAIR (N/A) ?LYSIS OF ADHESION (N/A) as a surgical intervention.  The patient's history has been reviewed, patient examined, no change in status, stable for surgery.  I have reviewed the patient's chart and labs.  Questions were answered to the patient's satisfaction.   ? ?I have re-reviewed the the patient's records, history, medications, and allergies.  I have re-examined the patient.  I again discussed intraoperative plans and goals of post-operative recovery.  The patient agrees to proceed. ? ?Heather [name]  ?06-30-1950 ?[id number] ? ?Patient Care Team: ?Binnie Rail, MD as PCP - General (Internal Medicine) ?Richmond Campbell, MD as Consulting Physician (Gastroenterology) ?Renato Shin, MD as Consulting Physician (Endocrinology) ?Tomasa Blase, [facility] (Pharmacist) ?Camillo Flaming, OD as Referring Physician (Optometry) ?Armbruster, Carlota Raspberry, MD as Consulting Physician (Gastroenterology) ?Michael Boston, MD as Consulting Physician (General Surgery) ? ?Patient Active Problem List  ? Diagnosis Date Noted  ? Osteopenia 07/05/2021  ? Acute right-sided low back pain without sciatica 03/03/2021  ? Fall 03/03/2021  ? Aortic atherosclerosis ([place]) 10/25/2020  ? Abdominal hernia 10/25/2020  ? GERD (gastroesophageal reflux disease) 04/26/2020  ? Vitamin D deficiency 04/25/2020  ? Arthralgia 10/26/2019  ? Dry cough 11/22/2017  ? Nocturnal leg cramps 02/02/2017  ? DOE (dyspnea on exertion) 08/04/2016  ? Obesity (BMI 30.0-34.9) 08/04/2016  ? Generalized osteoarthritis of hand 02/04/2016  ? Diabetic polyneuropathy associated  with diabetes mellitus due to underlying condition ([place]) 11/27/2015  ? Hiatal hernia, large 08/02/2015  ? Colon polyps 08/02/2015  ? Iron deficiency 04/21/2015  ? Diabetes type 2, controlled ([place]) 05/03/2014  ? Hyperlipidemia, mixed 03/18/2014  ? Interstitial cystitis 07/08/2012  ? Essential hypertension 02/10/2011  ? ? ?Past Medical History:  ?Diagnosis Date  ? Arthritis   ? Colon polyps   ? Diabetes mellitus without complication ([place])   ? Environmental allergies   ? GERD (gastroesophageal reflux disease)   ? Heart murmur   ? since birth never a problem per pt  ? History of DVT (deep vein thrombosis) 01/16/1969  ? History of hiatal hernia   ? History of transfusion   ? Hyperlipidemia   ? Hypertension   ? Interstitial cystitis   ? Dr Janice Norrie  ? PONV (postoperative nausea and vomiting)   ? ? ?Past Surgical History:  ?Procedure Laterality Date  ? APPENDECTOMY    ? BILATERAL SALPINGOOPHORECTOMY    ? painful cysts  ? CATARACT EXTRACTION, BILATERAL    ? COLONOSCOPY  2005  ? negative; Dr Earlean Shawl. F/U declined  ? CYSTOSCOPY    ? X 4; Dr Janice Norrie  ? LAPAROSCOPIC RIGHT COLECTOMY Right 08/19/2015  ? Procedure: LAPAROSCOPIC ASSISTED  RIGHT COLECTOMY;  Surgeon: Alphonsa Overall, MD;  Location: [facility];  Service: General;  Laterality: Right;  ? PREMAGLINANT POLYPS    ? TOTAL ABDOMINAL HYSTERECTOMY    ? metromenorrhagia  ? ? ?Social History  ? ?Socioeconomic History  ? Marital status: Widowed  ?  Spouse name: Not on file  ? Number of children: 2  ? Years of education: Not on file  ? Highest education level: Not on file  ?Occupational History  ? Not on file  ?  Tobacco Use  ? Smoking status: Former  ?  Types: Cigarettes  ?  Quit date: 05/18/1984  ?  Years since quitting: 37.3  ? Smokeless tobacco: Never  ? Tobacco comments:  ?  smoked 1972-1986, up to 1 ppd  ?Vaping Use  ? Vaping Use: Never used  ?Substance and Sexual Activity  ? Alcohol use: No  ?  Alcohol/week: 0.0 standard drinks  ? Drug use: No  ? Sexual activity: Never  ?Other Topics Concern  ?  Not on file  ?Social History Narrative  ? Lives with brother, daughter and 2 grandchildren in a 3 story home.  Has 2 children.  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    ? Highest level of education:  2 years of graduate school  ? ?Social Determinants of Health  ? ?Financial Resource Strain: Low Risk   ? Difficulty of Paying Living Expenses: Not hard at all  ?Food Insecurity: No Food Insecurity  ? Worried About Charity fundraiser in the Last Year: Never true  ? Ran Out of Food in the Last Year: Never true  ?Transportation Needs: No Transportation Needs  ? Lack of Transportation (Medical): No  ? Lack of Transportation (Non-Medical): No  ?Physical Activity: Inactive  ? Days of Exercise per Week: 0 days  ? Minutes of Exercise per Session: 0 min  ?Stress: No Stress Concern Present  ? Feeling of Stress : Not at all  ?Social Connections: Moderately Integrated  ? Frequency of Communication with Friends and Family: More than three times a week  ? Frequency of Social Gatherings with Friends and Family: More than three times a week  ? Attends Religious Services: 1 to 4 times per year  ? Active Member of Clubs or Organizations: Yes  ? Attends Archivist Meetings: 1 to 4 times per year  ? Marital Status: Widowed  ?Intimate Partner Violence: Not on file  ? ? ?Family History  ?Problem Relation Age of Onset  ? Heart attack Mother   ?     in 12s  ? Hypertension Mother   ? Stroke Mother   ?      in 107s  ? Other Father   ?     Deceased, 60  ? Stroke Maternal Grandfather 50  ? Heart attack Paternal Grandfather   ?     in 65s  ? Breast cancer Sister   ? Healthy Brother   ? Healthy Son   ? Healthy Daughter   ? Diabetes Neg Hx   ? ? ?Medications Prior to Admission  ?Medication Sig Dispense Refill Last Dose  ? atorvastatin (LIPITOR) 10 MG tablet TAKE 1 TABLET BY MOUTH EVERY DAY 90 tablet 1 09/16/2021  ? diclofenac Sodium (VOLTAREN) 1 % GEL APPLY 4 GRAMS TO AFFECTED JOINTS 4 TIMES  A DAY (Patient taking differently: Apply 4 g topically daily as needed (pain).) 400 g 11 Past Month  ? Dulaglutide (TRULICITY) 4.5 WG/9.5AO SOPN Inject 4.5 mg as directed once a week. (Patient taking differently: Inject 4.5 mg as directed every Friday.) 12 mL 3 Past Week  ? famotidine (PEPCID) 10 MG tablet Take 10 mg by mouth daily as needed for heartburn or indigestion.   Past Week  ? fluticasone (FLONASE) 50 MCG/ACT nasal spray Place 1 spray into both nostrils daily as needed for allergies or rhinitis.   Past Month  ? gabapentin (NEURONTIN) 600 MG tablet TAKE 2 TABLETS (1,200 MG  TOTAL) BY MOUTH AT BEDTIME. 180 tablet 1 09/16/2021  ? hydrochlorothiazide (HYDRODIURIL) 25 MG tablet TAKE 1 TABLET (25 MG TOTAL) BY MOUTH DAILY. 90 tablet 1 09/16/2021  ? ibuprofen (ADVIL) 200 MG tablet Take 400 mg by mouth every 8 (eight) hours as needed for moderate pain.   Past Week  ? losartan (COZAAR) 100 MG tablet TAKE 1 TABLET BY MOUTH EVERY DAY 90 tablet 1 09/16/2021  ? ondansetron (ZOFRAN-ODT) 4 MG disintegrating tablet Take 1 tablet (4 mg total) by mouth every 8 (eight) hours as needed for nausea or vomiting. 60 tablet 3 Past Week  ? Blood Glucose Monitoring Suppl (ONE TOUCH ULTRA 2) w/Device KIT 1 each by Does not apply route 4 (four) times daily. Use device to monitor glucose levels 4 times per day; E11.9 1 kit 0   ? glucose blood (ONETOUCH ULTRA) test strip Test once daily. DX E11.42 100 strip 3   ? Lancets (ONETOUCH DELICA PLUS [id number]) MISC USE TO TEST BLOOD SUGARS  ONCE DAILY 100 each 3   ? ? ?Current Facility-Administered Medications  ?Medication Dose Route Frequency Provider Last Rate Last Admin  ? bupivacaine liposome (EXPAREL) 1.3 % injection 266 mg  20 mL Infiltration Once Michael Boston, MD      ? ceFAZolin (ANCEF) IVPB 2g/100 mL premix  2 g Intravenous On Call to OR Michael Boston, MD      ? Chlorhexidine Gluconate Cloth 2 % PADS 6 each  6 each Topical Once Michael Boston, MD      ? And  ? Chlorhexidine Gluconate Cloth 2 %  PADS 6 each  6 each Topical Once Michael Boston, MD      ? feeding supplement (ENSURE PRE-SURGERY) liquid 296 mL  296 mL Oral Once Michael Boston, MD      ? feeding supplement (ENSURE PRE-SURGERY) liq

## 2021-09-17 NOTE — Anesthesia Postprocedure Evaluation (Signed)
Anesthesia Post Note ? ?Patient: Heather [name] ? ?Procedure(s) Performed: LAPAROSCOPIC VENTRAL WALL HERNIA REPAIR BILATERAL TAP BLOCK (Abdomen) ?LYSIS OF ADHESION (Abdomen) ?INSERTION OF MESH (Abdomen) ? ?  ? ?Anesthesia Type: General ?Anesthetic complications: no ? ? ?No notable events documented. ? ?Last Vitals:  ?Vitals:  ? 09/17/21 1145 09/17/21 1200  ?BP: 106/63 111/66  ?Pulse: 70 72  ?Resp: 12 13  ?Temp:    ?SpO2: 93% 97%  ?  ?Last Pain:  ?Vitals:  ? 09/17/21 1200  ?TempSrc:   ?PainSc: 4   ? ? ?  ?  ?  ?  ?  ?  ? ?[name] ? ? ? ? ?

## 2021-09-17 NOTE — Op Note (Signed)
09/17/2021 ? ?PATIENT:  Heather [name]  71 y.o. female ? ?Patient Care Team: ?Binnie Rail, MD as PCP - General (Internal Medicine) ?Richmond Campbell, MD as Consulting Physician (Gastroenterology) ?Renato Shin, MD as Consulting Physician (Endocrinology) ?Tomasa Blase, [facility] (Pharmacist) ?Camillo Flaming, OD as Referring Physician (Optometry) ?Armbruster, Carlota Raspberry, MD as Consulting Physician (Gastroenterology) ?Michael Boston, MD as Consulting Physician (General Surgery) ? ?PRE-OPERATIVE DIAGNOSIS:  VENTRAL INCISIONAL ABDOMINAL WALL HERNIA ? ?POST-OPERATIVE DIAGNOSIS:  VENTRAL INCISIONAL ABDOMINAL WALL HERNIA - INCARCERATED ? ?PROCEDURE:   ?LAPAROSCOPIC REPAIR OF ABDOMINAL HERNIA WITH MESH  ?(See OR Findings below) ?TAP BLOCK - BILATERAL ? ?SURGEON:  Adin Hector, MD ? ?ASSISTANT: Nurse  ? ?ANESTHESIA:    ? ?General ? ?Regional TRANSVERSUS ABDOMINIS PLANE (TAP) nerve block for perioperative & postoperative pain control provided with liposomal bupivacaine (Experel) mixed with 0.25% bupivacaine as a Bilateral TAP block x 65m each side at the level of the transverse abdominis & preperitoneal spaces along the flank at the anterior axillary line, from subcostal ridge to iliac crest under laparoscopic guidance  ? ?EBL:  No intake/output data recorded.  Per anesthesia record ? ?Delay start of Pharmacological VTE agent (>24hrs) due to surgical blood loss or risk of bleeding:  no ? ?DRAINS: none  ? ?SPECIMEN:  No Specimen ? ?DISPOSITION OF SPECIMEN:  N/A ? ?COUNTS:  YES ? ?PLAN OF CARE: Admit for overnight observation ? ?PATIENT DISPOSITION:  PACU - hemodynamically stable. ? ?INDICATION: Pleasant patient has developed a ventral wall abdominal hernia after prior colectomy.  Periumbilical incision.  Stuck.  CAT scan consistent with omentum and occasionally small bowel within it.. Recommendation was made for surgical repair ? ?The anatomy & physiology of the abdominal wall was discussed. The pathophysiology of hernias was  discussed. Natural history risks without surgery including progeressive enlargement, pain, incarceration & strangulation was discussed. Contributors to complications such as smoking, obesity, diabetes, prior surgery, etc were discussed.  ?I feel the risks of no intervention will lead to serious problems that outweigh the operative risks; therefore, I recommended surgery to reduce and repair the hernia. I explained laparoscopic techniques with possible need for an open approach. I noted the probable use of mesh to patch and/or buttress the hernia repair. ? ?Risks such as bleeding, infection, abscess, need for further treatment, heart attack, death, and other risks were discussed. I noted a good likelihood this will help address the problem. Goals of post-operative recovery were discussed as well. Possibility that this will not correct all symptoms was explained. I stressed the importance of low-impact activity, aggressive pain control, avoiding constipation, & not pushing through pain to minimize risk of post-operative chronic pain or injury. Possibility of reherniation especially with smoking, obesity, diabetes, immunosuppression, and other health conditions was discussed. We will work to minimize complications.  ? ?An educational handout further explaining the pathology & treatment options was given as well. Questions were answered. The patient expresses understanding & wishes to proceed with surgery.  ? ?OR FINDINGS: Swiss cheese hernias incarcerated with omentum through prior vertical periumbilical incision.  Small bowel within it reducible.   ? ?Hernia location: Periumbilical ? ?Hernia type: Incisional - Incarcerated ? ?Hernia size - largest dimension 12cm x 6cm ? ?Type of repair: Laparoscopic underlay repair.  Primary repair of largest hernia ?  ?Placement of mesh: Centrally intraperitoneal with edges tucked into RECTRORECTUS & preperitoneal space ? ?Name of mesh: Bard Ventralight dual sided (polypropylene /  Seprafilm) ? ?Size of mesh: 33x27cm ? ?Orientation: Transverse ? ?  Mesh overlap:   5-10cm ? ? ?DESCRIPTION:  ? ?Informed consent was confirmed. The patient underwent general anaesthesia without difficulty. The patient was positioned appropriately. VTE prevention in place. The patient's abdomen was clipped, prepped, & draped in a sterile fashion. Surgical timeout confirmed our plan.  The patient was positioned in reverse Trendelenburg. Abdominal entry was gained using optical entry technique in the left upper abdomen. Entry was clean. I induced carbon dioxide insufflation. Camera inspection revealed no injury. Extra ports were carefully placed under direct laparoscopic visualization.  ? ?I could see adhesions on the parietal peritoneum under the abdominal wall.  Greater omentum involved in periumbilical hernia.  I did laparoscopic lysis of adhesions to gradually free adhesions and then reduce the omentum down.  I ensured there were no more adhesions on the entire anterior abdominal wall.  I primarily used focused sharp dissection.  I freed off the falciform ligament and central peritoneum to expose the retrorectus fascia   I made sure hemostasis was good.  I mapped out the region using a needle passer.   To ensure that I would have at least 5 cm radial coverage outside of the hernia defect, I chose a 33x27cm dual sided mesh.  I placed #1 Prolene stitches around its edge about every 5 cm = 12 total.  I rolled the mesh & placed into the peritoneal cavity through the hernia defect.  I unrolled the mesh and positioned it appropriately. ? ?I secured the mesh to cover up the hernia defect using a laparoscopic suture passer to pass the tails of the Prolene through the abdominal wall & tagged them with clamps for good transfascial suturing.  I started out in four corners to make sure I had the mesh centered under the hernia defect appropriately, and then proceeded to work in quadrants.   ? ?We evacuated CO2 & desufflated the  abdomen.  I tied the fascial stitches down.  Freed the umbilical stalk to expose the incisional hernia.  I closed it vertically using #1 PDS in a running fashion taking care to try and spare some bridges of fascia and this was GS areas.   I reinsufflated the abdomen. The mesh provided at least circumferential coverage around the entire region of hernia defects.  I secured the mesh centrally with an additional trans fascial stitch in & out the mesh using #1 PDS under laparoscopic visualization.   Given her morbid obesity with large defect I also used #1 PDS sutures to tack the mesh along the midclavicular line superiorly and inferiorly on each side, an additional four #1 PDS is centrally.  I used this to help bring up the falciform ligament and central peritoneum to help tack the mesh in well.  I tacked the edges & central part of the mesh to the peritoneum/posterior rectus fascia with SecureStrap absorbable tacks.   I did reinspection. Hemostasis was good. Mesh laid well. I completed a broad field block of local anesthesia at fascial stitch sites & fascial closure areas.   ? ?Capnoperitoneum was evacuated. Ports were removed.  Patient had a very thinned out redundant large hernia sac that was going to the left lateral abdomen.  I excised skin and hernia sac transversely to remove all dead space and thinned out skin.  I was able to tack the umbilical stalk back down to the fascia.  Reapproximated this 15 cm transverse wound with 3-0 Vicryl interrupted deep dermal sutures and 4 Monocryl subcuticular running suture.  That got rid of the dilated  hernia sac and at the skin much more flat with minimal dead space remaining and healthy skin and subcutaneous flaps.  Do not feel we need to leave a drain.  The skin was closed with Monocryl at the port sites and Steri-Strips on the fascial stitch puncture sites.  Patient is being extubated to go to the recovery room.  I discussed postoperative plans recommendation and pain  control.  Given the larger hernia with her obesity and her health issues we will watch her overnight to be safe.  Made attempt to reach her daughter, Rejeana Fadness, the requested point of contact with no a

## 2021-09-17 NOTE — Anesthesia Procedure Notes (Signed)
Procedure Name: Intubation ?Date/Time: 09/17/2021 8:32 AM ?Performed by: Maxwell Caul, CRNA ?Pre-anesthesia Checklist: Patient identified, Emergency Drugs available, Suction available and Patient being monitored ?Patient Re-evaluated:Patient Re-evaluated prior to induction ?Oxygen Delivery Method: Circle system utilized ?Preoxygenation: Pre-oxygenation with 100% oxygen ?Induction Type: IV induction ?Ventilation: Mask ventilation without difficulty ?Laryngoscope Size: Mac and 4 ?Grade View: Grade I ?Tube type: Oral ?Tube size: 7.0 mm ?Number of attempts: 1 ?Airway Equipment and Method: Stylet ?Placement Confirmation: ETT inserted through vocal cords under direct vision, positive ETCO2 and breath sounds checked- equal and bilateral ?Secured at: 21 cm ?Tube secured with: Tape ?Dental Injury: Teeth and Oropharynx as per pre-operative assessment  ? ? ? ? ?

## 2021-09-17 NOTE — Discharge Instructions (Addendum)
HERNIA REPAIR: POST OP INSTRUCTIONS ? ?###################################################################### ? ?EAT ?Gradually transition to a high fiber diet with a fiber supplement over the next few weeks after discharge.  Start with a pureed / full liquid diet (see below) ? ?WALK ?Walk an hour a day.  Control your pain to do that.   ? ?CONTROL PAIN ?Control pain so that you can walk, sleep, tolerate sneezing/coughing, and go up/down stairs. ? ?HAVE A BOWEL MOVEMENT DAILY ?Keep your bowels regular to avoid problems.  OK to try a laxative to override constipation.  OK to use an antidairrheal to slow down diarrhea.  Call if not better after 2 tries ? ?CALL IF YOU HAVE PROBLEMS/CONCERNS ?Call if you are still struggling despite following these instructions. ?Call if you have concerns not answered by these instructions ? ?###################################################################### ? ? ? ?DIET: Follow a light bland diet & liquids the first 24 hours after arrival home, such as soup, liquids, starches, etc.  Be sure to drink plenty of fluids.  Quickly advance to a usual solid diet within a few days.  Avoid fast food or heavy meals as your are more likely to get nauseated or have irregular bowels.  A low-fat, high-fiber diet for the rest of your life is ideal.  ? ?Take your usually prescribed home medications unless otherwise directed. ? ?PAIN CONTROL: ?Pain is best controlled by a usual combination of three different methods TOGETHER: ?Ice/Heat ?Over the counter pain medication ?Prescription pain medication ?Most patients will experience some swelling and bruising around the hernia(s) such as the bellybutton, groins, or old incisions.  Ice packs or heating pads (30-60 minutes up to 6 times a day) will help. Use ice for the first few days to help decrease swelling and bruising, then switch to heat to help relax tight/sore spots and speed recovery.  Some people prefer to use ice alone, heat alone, alternating  between ice & heat.  Experiment to what works for you.  Swelling and bruising can take several weeks to resolve.   ?It is helpful to take an over-the-counter pain medication regularly for the first few weeks.  Choose one of the following that works best for you: ?Naproxen (Aleve, etc)  Two 274m tabs twice a day ?Ibuprofen (Advil, etc) Three 2015mtabs four times a day (every meal & bedtime) ?Acetaminophen (Tylenol, etc) 325-65029mour times a day (every meal & bedtime) ?A  prescription for pain medication should be given to you upon discharge.  Take your pain medication as prescribed.  ?If you are having problems/concerns with the prescription medicine (does not control pain, nausea, vomiting, rash, itching, etc), please call us [phone] see if we need to switch you to a different pain medicine that will work better for you and/or control your side effect better. ?If you need a refill on your pain medication, please contact your pharmacy.  They will contact our office to request authorization. Prescriptions will not be filled after 5 pm or on week-ends. ? ?Avoid getting constipated.  Between the surgery and the pain medications, it is common to experience some constipation.  Increasing fluid intake and taking a fiber supplement (such as Metamucil, Citrucel, FiberCon, MiraLax, etc) 1-2 times a day regularly will usually help prevent this problem from occurring.  A mild laxative (prune juice, Milk of Magnesia, MiraLax, etc) should be taken according to package directions if there are no bowel movements after 48 hours.   ? ?Wash / shower every day.  You may shower over the dressings  as they are waterproof.  It is good for closed incisions and even open wounds to be washed every day.  Shower every day.  Short baths are fine.  Wash the incisions and wounds clean with soap & water.    ?   You may leave closed incisions open to air if it is dry.   You may cover the incision with clean gauze & replace it after  your daily shower for comfort. ? ?TEGADERM & STERISTRIPS:  You have clear gauze band-aid dressings over your closed incision(s).  You also have skin tapes called Steristrips on your incisions.  Leave these in place.  You may trim any edges that curl up with clean scissors.  You may remove all dressings & tapes in the shower in a week. ? ?ACTIVITIES as tolerated:   ?You may resume regular (light) daily activities beginning the next day--such as daily self-care, walking, climbing stairs--gradually increasing activities as tolerated.  Control your pain so that you can walk an hour a day.  If you can walk 30 minutes without difficulty, it is safe to try more intense activity such as jogging, treadmill, bicycling, low-impact aerobics, swimming, etc. ?Save the most intensive and strenuous activity for last such as sit-ups, heavy lifting, contact sports, etc  Refrain from any heavy lifting or straining until you are off narcotics for pain control.  It takes several weeks before you are able to safely wean off pain medications and do more intense activity ?DO NOT PUSH THROUGH PAIN.  Let pain be your guide: If it hurts to do something, don't do it.  Pain is your body warning you to avoid that activity for another week until the pain goes down. ?You may drive when you are no longer taking prescription pain medication, you can comfortably wear a seatbelt, and you can safely maneuver your car and apply brakes. ?You may have sexual intercourse when it is comfortable.  ? ?FOLLOW UP in our office ?Please call [facility] at [phone] to set up an appointment to see your surgeon in the office for a follow-up appointment approximately 2-3 weeks after your surgery. ?Make sure that you call for this appointment the day you arrive home to insure a convenient appointment time. ? ?9.  If you have disability of FMLA / Family leave forms, please bring the forms to the office for processing.  (do not give to your surgeon). ? ?WHEN TO CALL us  [phone]: ?Poor pain control ?Reactions / problems with new medications (rash/itching, nausea, etc)  ?Fever over 101.5 F (38.5 C) ?Inability to urinate ?Nausea and/or vomiting ?Worsening swelling or bruising ?Continued bleeding from incision. ?Increased pain, redness, or drainage from the incision ? ? The clinic staff is available to answer your questions during regular business hours (8:30am-5pm).  Please don?t hesitate to call and ask to speak to one of our nurses for clinical concerns.  ? If you have a medical emergency, go to the nearest emergency room or call 911. ? A surgeon from [facility] is always on call at the hospitals in [place] ? ?[facility], [place] ?[address] [place], [place], [place]  [address] ? ? [address], [place], [place]   [address] ?MAIN: [phone] ? TOLL FREE: [phone] ? FAX: [phone] ?[email or web address] ? ?

## 2021-09-17 NOTE — H&P (Signed)
09/17/2021 ? ? ? ?REFERRING PHYSICIAN: Armbruster, Renelda Loma* ? ?Patient Care Team: ?Binnie Rail, MD as PCP - General (Internal Medicine) ? ?PROVIDER: Hollace Kinnier, MD ? ?[facility] MRN: [id number] ?DOB: 26-Jul-1950 ?DATE OF ENCOUNTER: 07/14/2021 ? ?SUBJECTIVE  ? ?Chief Complaint: Follow-up (/) ? ? ?History of Present Illness: ?Heather [name] is a 71 y.o. female who is seen today  ?as an office consultation at the request of Dr. Havery Moros  ?for evaluation of Follow-up (/) ?.  ? ?Pleasant active woman. Diabetic on oral hypoglycemics. Obese. She required a laparoscopically assisted right hemicolectomy for a cecal polyp that was not able to be endoscopically resected. This was done by Dr. Lucia Gaskins in 2017 with our group who has since retired. Patient's had some issues of rectal bleeding and intermittent abdominal pain. She saw gastroenterology. They recommend considering upper and lower endoscopy. CT scan to rule out other issues. CT scan revealed supraumbilical hernia containing colon and small bowel within it. Patient notes she will get intermittent sharp periumbilical pains that alter her bowels where she feels constipated and bloated. He is to be taken care of by Dr. Earlean Shawl but more recently established with [facility] gastroenterology. Based on the concerning symptoms, Dr. Havery Moros requested urgent surgical evaluation. ? ?Patient also has a known hiatal hernia that is of at least moderate large size.. Patient does note she gets some heartburn and reflux issues. Takes an H2 blocker. That usually controls things. No severe dysphagia to solids or liquids. No hematemesis. No coffee-ground emesis. She usually moves her bowels once or twice a day but occasionally has days of full some bloatedness when she has pain around her bellybutton. She has unintentionally gained some weight. She was part of an exercise program and she was up to walking 5 miles a day. However her abdominal pain worsened without level activity which  prompted the evaluation by gastroenterology and her primary care physician. She does not smoke. No sleep apnea. ? ?Medical History: ? ?Past Medical History:  ?Diagnosis Date  ? Anemia  ? Arthritis  ? Diabetes mellitus without complication (CMS-HCC)  ? Hyperlipidemia  ? Hypertension  ? ?There is no problem list on file for this patient. ? ?Past Surgical History:  ?Procedure Laterality Date  ? APPENDECTOMY  ? COLON SURGERY  ? HYSTERECTOMY  ? ? ?Allergies  ?Allergen Reactions  ? Dapagliflozin Other (See Comments)  ?Constant Vaginal Infections  ? Empagliflozin Other (See Comments)  ?Constant Vaginal infections  ? Lisinopril Other (See Comments)  ?04/03/15 cough reported X several months ?04/03/15 cough reported X several months ? ? Metronidazole Other (See Comments)  ?Made really sick. Was able to tolerate dose 4/17 with zofran ?Made really sick. Was able to tolerate dose 4/17 with zofran ? ? Cholestyramine Other (See Comments)  ?Joint pain  ? Repaglinide Nausea  ? Sulfasalazine Other (See Comments)  ?nausea  ? ?Current Outpatient Medications on File Prior to Visit  ?Medication Sig Dispense Refill  ? losartan (COZAAR) 100 MG tablet Take 1 tablet by mouth once daily  ? atorvastatin (LIPITOR) 10 MG tablet Take 10 mg by mouth once daily  ? hydroCHLOROthiazide (HYDRODIURIL) 25 MG tablet Take 25 mg by mouth once daily  ? TRULICITY 4.5 OM/7.6 mL subcutaneous pen injector INJECT 4.5 MG AS DIRECTED ONCE A WEEK.  ? ?No current facility-administered medications on file prior to visit.  ? ?Family History  ?Problem Relation Age of Onset  ? Stroke Mother  ? High blood pressure (Hypertension) Mother  ? Hyperlipidemia (Elevated cholesterol)  Mother  ? Obesity Sister  ? Breast cancer Sister  ? ? ?Social History  ? ?Tobacco Use  ?Smoking Status Former  ? Types: Cigarettes  ? Quit date: 76  ? Years since quitting: 37.1  ?Smokeless Tobacco Never  ? ? ?Social History  ? ?Socioeconomic History  ? Marital status: Widowed  ?Tobacco Use  ?  Smoking status: Former  ?Types: Cigarettes  ?Quit date: 39  ?Years since quitting: 37.1  ? Smokeless tobacco: Never  ?Substance and Sexual Activity  ? Alcohol use: Never  ? Drug use: Never  ? ?############################################################ ? ?Review of Systems: ?A complete review of systems (ROS) was obtained from the patient. I have reviewed this information and discussed as appropriate with the patient. See HPI as well for other pertinent ROS. ? ?Constitutional: No fevers, chills, sweats. Weight stable ?Eyes: No vision changes, No discharge ?HENT: No sore throats, nasal drainage ?Lymph: No neck swelling, No bruising easily ?Pulmonary: No cough, productive sputum ?CV: No orthopnea, PND Patient walks 5 miles without difficulty. No exertional chest/neck/shoulder/arm pain. ? ?GI: No personal nor family history of GI/colon cancer, inflammatory bowel disease, irritable bowel syndrome, allergy such as Celiac Sprue, dietary/dairy problems, colitis, ulcers nor gastritis. No recent sick contacts/gastroenteritis. No travel outside the country. No changes in diet. ? ?Renal: No UTIs, No hematuria ?Genital: No drainage, bleeding, masses ?Musculoskeletal: No severe joint pain. Good ROM major joints ?Skin: No sores or lesions ?Heme/Lymph: No easy bleeding. No swollen lymph nodes ? ?OBJECTIVE  ? ?Vitals:  ?07/14/21 1029  ?BP: 136/80  ?Pulse: 103  ?Temp: 36.8 ?C (98.2 ?F)  ?SpO2: (!) 80%  ?Weight: 77.2 kg (170 lb 3.2 oz)  ?Height: 152.4 cm (5')  ? ? ?Body mass index is 33.24 kg/m?. ? ?PHYSICAL EXAM: ? ?Constitutional: Not cachectic. Hygeine adequate. Vitals signs as above.  ?Eyes: Pupils reactive, normal extraocular movements. Sclera nonicteric ?Neuro: CN II-XII intact. No major focal sensory defects. No major motor deficits. ?Lymph: No head/neck/groin lymphadenopathy ?Psych: No severe agitation. No severe anxiety. Judgment & insight Adequate, Oriented x4, ?HENT: Normocephalic, Mucus membranes moist. No thrush.  Hearing: adequate ?Neck: Supple, No tracheal deviation. No obvious thyromegaly ?Chest: No pain to chest wall compression. Good respiratory excursion. No audible wheezing ?CV: Pulses intact. Regular rhythm. No major extremity edema ?Ext: No obvious deformity or contracture. Edema: Not present. No cyanosis ?Skin: No major subcutaneous nodules. Warm and dry ?Musculoskeletal: Severe joint rigidity not present. No obvious clubbing. No digital petechiae. Mobility: no assist device moving easily without restrictions ? ?Abdomen:  ?Obese  ?Soft. Nondistended. Tenderness at hernia. Hernia: Present at: supraumbilica incision, size 2V0JJ. Diastasis recti: Mild supraumbilical midline. No hepatomegaly. No splenomegaly. ? ?Genital/Pelvic: Inguinal hernia: Not present. Inguinal lymph nodes: without lymphadenopathy nor hidradenitis.  ? ?Rectal: (Deferred) ? ? ? ?################################################################### ? ?Labs, Imaging and Diagnostic Testing: ? ?Located in [place]' section of Epic EMR chart ? ?PRIOR [facility] CLINIC NOTES: ? ?Not applicable ? ?SURGERY NOTES: ? ?Located in [place]' section of Epic EMR chart ? ?PATHOLOGY: ? ?Not applicable ? ?Assessment and Plan:  ?DIAGNOSES: ? ?Diagnoses and all orders for this visit: ? ?Incisional hernia, without obstruction or gangrene ? ?Obesity (BMI 30-39.9), unspecified ? ?Diabetes mellitus type 2, noninsulin dependent (CMS-HCC) ? ? ? ?ASSESSMENT/PLAN ? ?Pleasant woman with supraumbilical incisional hernia containing bowel and colon with intermittent abdominal pains and obstipation. Reducible today. ? ?I think she would benefit from laparoscopic exploration and periumbilical hernia repair. Most likely her primary repair on top of it. Her  diabetes is under good control. She does have good exercise tolerance. Her obesity does raise the risk of recurrence Would plan a moderately large sheet of mesh. May need to stay overnight. ? ?The anatomy & physiology of  the abdominal wall was discussed. The pathophysiology of hernias was discussed. Natural history risks without surgery including progeressive enlargement, pain, incarceration, & strangulation was discussed. Co

## 2021-09-17 NOTE — TOC Progression Note (Signed)
Transition of Care (TOC) - Progression Note  ? ? ?Patient Details  ?Name: Heather [name] ?MRN: [id number] ?Date of Birth: [birth year] ? ?Transition of Care (TOC) CM/SW Contact  ?Purcell Mouton, RN ?Phone Number: ?09/17/2021, 1:20 PM ? ?Clinical Narrative:    ? ? ?Transition of Care (TOC) Screening Note ? ? ?Patient Details  ?Name: Heather [name] ?Date of Birth: 1950/10/01 ? ? ?Transition of Care (TOC) CM/SW Contact:    ?Purcell Mouton, RN ?Phone Number: ?09/17/2021, 1:20 PM ? ? ? ?Transition of Care Department [facility]) has reviewed patient and no TOC needs have been identified at this time. We will continue to monitor patient advancement through interdisciplinary progression rounds. If new patient transition needs arise, please place a TOC consult. ?  ? ?  ?  ? ?Expected Discharge Plan and Services ?  ?  ?  ?  ?  ?                ?  ?  ?  ?  ?  ?  ?  ?  ?  ?  ? ? ?Social Determinants of Health (SDOH) Interventions ?  ? ?Readmission Risk Interventions ?   ? View : No data to display.  ?  ?  ?  ? ? ?

## 2021-09-17 NOTE — Transfer of Care (Signed)
Immediate Anesthesia Transfer of Care Note ? ?Patient: Heather [name] ? ?Procedure(s) Performed: LAPAROSCOPIC VENTRAL WALL HERNIA REPAIR BILATERAL TAP BLOCK (Abdomen) ?LYSIS OF ADHESION (Abdomen) ?INSERTION OF MESH (Abdomen) ? ?Patient Location: PACU ? ?Anesthesia Type:General ? ?Level of Consciousness: awake, alert  and oriented ? ?Airway & Oxygen Therapy: Patient Spontanous Breathing and Patient connected to face mask oxygen ? ?Post-op Assessment: Report given to RN and Post -op Vital signs reviewed and stable ? ?Post vital signs: Reviewed and stable ? ?Last Vitals:  ?Vitals Value Taken Time  ?BP 131/72 09/17/21 1121  ?Temp    ?Pulse 76 09/17/21 1123  ?Resp 13 09/17/21 1123  ?SpO2 100 % 09/17/21 1123  ?Vitals shown include unvalidated device data. ? ?Last Pain:  ?Vitals:  ? 09/17/21 0703  ?TempSrc: Oral  ?PainSc:   ?   ? ?  ? ?Complications: No notable events documented. ?

## 2021-09-18 ENCOUNTER — Encounter (HOSPITAL_COMMUNITY): Payer: Self-pay | Admitting: Surgery

## 2021-09-18 DIAGNOSIS — K436 Other and unspecified ventral hernia with obstruction, without gangrene: Secondary | ICD-10-CM | POA: Diagnosis not present

## 2021-09-18 DIAGNOSIS — Z87891 Personal history of nicotine dependence: Secondary | ICD-10-CM | POA: Diagnosis not present

## 2021-09-18 DIAGNOSIS — Z79899 Other long term (current) drug therapy: Secondary | ICD-10-CM | POA: Diagnosis not present

## 2021-09-18 DIAGNOSIS — I10 Essential (primary) hypertension: Secondary | ICD-10-CM | POA: Diagnosis not present

## 2021-09-18 DIAGNOSIS — K430 Incisional hernia with obstruction, without gangrene: Secondary | ICD-10-CM | POA: Diagnosis not present

## 2021-09-18 DIAGNOSIS — E1142 Type 2 diabetes mellitus with diabetic polyneuropathy: Secondary | ICD-10-CM | POA: Diagnosis not present

## 2021-09-18 DIAGNOSIS — K219 Gastro-esophageal reflux disease without esophagitis: Secondary | ICD-10-CM | POA: Diagnosis not present

## 2021-09-18 DIAGNOSIS — Z794 Long term (current) use of insulin: Secondary | ICD-10-CM | POA: Diagnosis not present

## 2021-09-18 DIAGNOSIS — Z86718 Personal history of other venous thrombosis and embolism: Secondary | ICD-10-CM | POA: Diagnosis not present

## 2021-09-18 NOTE — Plan of Care (Signed)
DC instructions received, all questions answered. Patient transported to exit via wheel chair.  ? ?Problem: Education: ?Goal: Knowledge of General Education information will improve ?Description: Including pain rating scale, medication(s)/side effects and non-pharmacologic comfort measures ?Outcome: Adequate for Discharge ?  ?Problem: Health Behavior/Discharge Planning: ?Goal: Ability to manage health-related needs will improve ?Outcome: Adequate for Discharge ?  ?Problem: Clinical Measurements: ?Goal: Ability to maintain clinical measurements within normal limits will improve ?Outcome: Adequate for Discharge ?Goal: Will remain free from infection ?Outcome: Adequate for Discharge ?Goal: Diagnostic test results will improve ?Outcome: Adequate for Discharge ?Goal: Respiratory complications will improve ?Outcome: Adequate for Discharge ?Goal: Cardiovascular complication will be avoided ?Outcome: Adequate for Discharge ?  ?Problem: Activity: ?Goal: Risk for activity intolerance will decrease ?Outcome: Adequate for Discharge ?  ?Problem: Nutrition: ?Goal: Adequate nutrition will be maintained ?Outcome: Adequate for Discharge ?  ?Problem: Coping: ?Goal: Level of anxiety will decrease ?Outcome: Adequate for Discharge ?  ?Problem: Elimination: ?Goal: Will not experience complications related to bowel motility ?Outcome: Adequate for Discharge ?Goal: Will not experience complications related to urinary retention ?Outcome: Adequate for Discharge ?  ?Problem: Pain Managment: ?Goal: General experience of comfort will improve ?Outcome: Adequate for Discharge ?  ?Problem: Safety: ?Goal: Ability to remain free from injury will improve ?Outcome: Adequate for Discharge ?  ?Problem: Skin Integrity: ?Goal: Risk for impaired skin integrity will decrease ?Outcome: Adequate for Discharge ?  ?

## 2021-09-18 NOTE — Discharge Summary (Signed)
Physician Discharge Summary  ? ? ?Patient ID: ?Heather [name] ?MRN: [id number] ?DOB/AGE: [birth year]  ?71 y.o. ? ?Patient Care Team: ?Binnie Rail, MD as PCP - General (Internal Medicine) ?Richmond Campbell, MD as Consulting Physician (Gastroenterology) ?Renato Shin, MD as Consulting Physician (Endocrinology) ?Tomasa Blase, [facility] (Pharmacist) ?Camillo Flaming, OD as Referring Physician (Optometry) ?Armbruster, Carlota Raspberry, MD as Consulting Physician (Gastroenterology) ?Michael Boston, MD as Consulting Physician (General Surgery) ? ?Admit date: 09/17/2021 ? ?Discharge date: 09/18/2021 ? ?Hospital Stay = 0 days ? ? ? ?Discharge Diagnoses:  ?Principal Problem: ?  Incarcerated incisional hernia s/p lap repair w mesh 09/17/2021 ?Active Problems: ?  Essential hypertension ?  Diabetes type 2, controlled ([place]) ?  Hiatal hernia, large ?  Obesity (BMI 30.0-34.9) ?  GERD (gastroesophageal reflux disease) ?  Incarcerated ventral hernia ? ? ?1 Day Post-Op  09/17/2021 ?POST-OPERATIVE DIAGNOSIS:  VENTRAL INCISIONAL ABDOMINAL WALL HERNIA - INCARCERATED ?  ?PROCEDURE:   ?LAPAROSCOPIC REPAIR OF ABDOMINAL HERNIA WITH MESH  ?(See OR Findings below) ?TAP BLOCK - BILATERAL ?  ?SURGEON:  Adin Hector, MD ?  ?OR FINDINGS:  ?Swiss cheese hernias incarcerated with omentum through prior vertical periumbilical incision.  Small bowel within it reducible.   ?  ?Hernia location: Periumbilical ?  ?Hernia type: Incisional - Incarcerated ?  ?Hernia size - largest dimension 12cm x 6cm ?  ?Type of repair: Laparoscopic underlay repair.  Primary repair of largest hernia ?             ?Placement of mesh: Centrally intraperitoneal with edges tucked into RECTRORECTUS & preperitoneal space ?  ?Name of mesh: Bard Ventralight dual sided (polypropylene / Seprafilm) ?  ?Size of mesh: 33x27cm ?  ?Orientation: Transverse ?  ?Mesh overlap:   5-10cm ?  ? ?Consults: NONE ? ?Hospital Course:  ? ?The patient underwent the surgery above.  Postoperatively, the patient gradually  mobilized and advanced to a solid diet.  Pain and other symptoms were treated aggressively.   ? ?By the time of discharge, the patient was walking well the hallways, eating food, having flatus.  Pain was well-controlled on an oral medications.  Based on meeting discharge criteria and continuing to recover, I felt it was safe for the patient to be discharged from the hospital to further recover with close followup. Postoperative recommendations were discussed in detail.  They are written as well. ? ?Discharged Condition: good ? ?Discharge Exam: ?Blood pressure (!) 103/53, pulse 73, temperature 97.7 ?F (36.5 ?C), temperature source Oral, resp. rate 16, SpO2 95 %. ? ?General: Pt awake/alert/oriented x4 in No acute distress ?Eyes: PERRL, normal EOM.  Sclera clear.  No icterus ?Neuro: CN II-XII intact w/o focal sensory/motor deficits. ?Lymph: No head/neck/groin lymphadenopathy ?Psych:  No delerium/psychosis/paranoia ?HENT: Normocephalic, Mucus membranes moist.  No thrush ?Neck: Supple, No tracheal deviation ?Chest:  No chest wall pain w good excursion ?CV:  Pulses intact.  Regular rhythm ?MS: Normal AROM mjr joints.  No obvious deformity ?Abdomen: Soft.  Nondistended.  Mildly tender at incisions only.  Dressings c/d/I.  No evidence of peritonitis.  No incarcerated hernias. ?Ext:  SCDs BLE.  No mjr edema.  No cyanosis ?Skin: No petechiae / purpura ? ? ?Disposition:  ? ? Follow-up Information   ? ? Michael Boston, MD Follow up in 3 week(s).   ?Specialties: General Surgery, Colon and Rectal Surgery ?Why: To follow up after your operation ?Contact information: ?[address] ?[address] ?[place] [address] ?[phone] ? ? ?  ?  ? ?  ?  ? ?  ? ? ?  Discharge disposition: 01-Home or Self Care ? ? ? ? ? ? ?Discharge Instructions   ? ? Call MD for:   Complete by: As directed ?  ? FEVER > 101.5 F  ?(temperatures < 101.5 F are not significant)  ? Call MD for:  extreme fatigue   Complete by: As directed ?  ? Call MD for:   persistant dizziness or light-headedness   Complete by: As directed ?  ? Call MD for:  persistant nausea and vomiting   Complete by: As directed ?  ? Call MD for:  redness, tenderness, or signs of infection (pain, swelling, redness, odor or green/yellow discharge around incision site)   Complete by: As directed ?  ? Call MD for:  severe uncontrolled pain   Complete by: As directed ?  ? Diet - low sodium heart healthy   Complete by: As directed ?  ? Start with a bland diet such as soups, liquids, starchy foods, low fat foods, etc. the first few days at home. ?Gradually advance to a solid, low-fat, high fiber diet by the end of the first week at home.   ?Add a fiber supplement to your diet (Metamucil, etc) ?If you feel full, bloated, or constipated, stay on a full liquid or pureed/blenderized diet for a few days until you feel better and are no longer constipated.  ? Discharge instructions   Complete by: As directed ?  ? See Discharge Instructions ?If you are not getting better after two weeks or are noticing you are getting worse, contact our office [phone] for further advice.  We may need to adjust your medications, re-evaluate you in the office, send you to the emergency room, or see what other things we can do to help. ?The clinic staff is available to answer your questions during regular business hours (8:30am-5pm).  Please don't hesitate to call and ask to speak to one of our nurses for clinical concerns.    ?A surgeon from [facility] is always on call at the hospitals 24 hours/day ?If you have a medical emergency, go to the nearest emergency room or call 911.  ? Discharge wound care:   Complete by: As directed ?  ? It is good for closed incisions and even open wounds to be washed every day.  Shower every day.  Short baths are fine.  Wash the incisions and wounds clean with soap & water.    ?You may leave closed incisions open to air if it is dry.   You may cover the incision with clean  gauze & replace it after your daily shower for comfort. ? ?TEGADERM & STERISTRIPS:  You have clear gauze band-aid dressings over your closed incision(s).  You also have skin tapes called Steristrips on your incisions.  Leave these in place.  You may trim any edges that curl up with clean scissors.  You may remove all dressings & tapes in the shower in a week.  ? Driving Restrictions   Complete by: As directed ?  ? You may drive when: ?- you are no longer taking narcotic prescription pain medication ?- you can comfortably wear a seatbelt ?- you can safely make sudden turns/stops without pain.  ? Increase activity slowly   Complete by: As directed ?  ? Start light daily activities --- self-care, walking, climbing stairs- beginning the day after surgery.  Gradually increase activities as tolerated.  Control your pain to be active.  Stop when you are tired.  Ideally, walk  several times a day, eventually an hour a day.   ?Most people are back to most day-to-day activities in a few weeks.  It takes 4-6 weeks to get back to unrestricted, intense activity. ?If you can walk 30 minutes without difficulty, it is safe to try more intense activity such as jogging, treadmill, bicycling, low-impact aerobics, swimming, etc. ?Save the most intensive and strenuous activity for last (Usually 4-8 weeks after surgery) such as sit-ups, heavy lifting, contact sports, etc.  Refrain from any intense heavy lifting or straining until you are off narcotics for pain control.  You will have off days, but things should improve week-by-week. ?DO NOT PUSH THROUGH PAIN.  Let pain be your guide: If it hurts to do something, don't do it.  ? Lifting restrictions   Complete by: As directed ?  ? If you can walk 30 minutes without difficulty, it is safe to try more intense activity such as jogging, treadmill, bicycling, low-impact aerobics, swimming, etc. ?Save the most intensive and strenuous activity for last (Usually 4-8 weeks after surgery) such as  sit-ups, heavy lifting, contact sports, etc.   ?Refrain from any intense heavy lifting or straining until you are off narcotics for pain control.  You will have off days, but things should improve week-by-week. ?

## 2021-10-02 LAB — HM DIABETES EYE EXAM

## 2021-10-15 DIAGNOSIS — K432 Incisional hernia without obstruction or gangrene: Secondary | ICD-10-CM | POA: Diagnosis not present

## 2021-10-15 DIAGNOSIS — R1033 Periumbilical pain: Secondary | ICD-10-CM | POA: Diagnosis not present

## 2021-10-21 ENCOUNTER — Other Ambulatory Visit: Payer: Self-pay | Admitting: Internal Medicine

## 2021-10-27 ENCOUNTER — Encounter: Payer: Self-pay | Admitting: Internal Medicine

## 2021-10-27 NOTE — Progress Notes (Signed)
Subjective:    Patient ID: Heather [name], female    DOB: 1951/03/26, 71 y.o.   MRN: [id number]     HPI Heather [name] is here for follow up of her chronic medical problems, including DM with neuropathy, htn, hld  Had ventral wall hernia repair 09/17/21   Not taking statin - had liver enlargement on Ct.   Not eating regularly.  Has increased stress - her brother is in the hospital and she has been there daily.    Medications and allergies reviewed with patient and updated if appropriate.  Current Outpatient Medications on File Prior to Visit  Medication Sig Dispense Refill   Blood Glucose Monitoring Suppl (ONE TOUCH ULTRA 2) w/Device KIT 1 each by Does not apply route 4 (four) times daily. Use device to monitor glucose levels 4 times per day; E11.9 1 kit 0   diclofenac Sodium (VOLTAREN) 1 % GEL APPLY 4 GRAMS TO AFFECTED JOINTS 4 TIMES A DAY (Patient taking differently: Apply 4 g topically daily as needed (pain).) 400 g 11   famotidine (PEPCID) 10 MG tablet Take 10 mg by mouth daily as needed for heartburn or indigestion.     gabapentin (NEURONTIN) 600 MG tablet TAKE 2 TABLETS (1,200 MG TOTAL) BY MOUTH AT BEDTIME. 180 tablet 1   glucose blood (ONETOUCH ULTRA) test strip Test once daily. DX E11.42 100 strip 3   hydrochlorothiazide (HYDRODIURIL) 25 MG tablet TAKE 1 TABLET (25 MG TOTAL) BY MOUTH DAILY. 90 tablet 1   HYDROcodone-acetaminophen (NORCO/VICODIN) 5-325 MG tablet Take 1 tablet by mouth every 6 (six) hours as needed.     ibuprofen (ADVIL) 200 MG tablet Take 400 mg by mouth every 8 (eight) hours as needed for moderate pain.     Lancets (ONETOUCH DELICA PLUS [id number]) MISC USE TO TEST BLOOD SUGARS  ONCE DAILY 100 each 3   losartan (COZAAR) 100 MG tablet TAKE 1 TABLET BY MOUTH EVERY DAY 90 tablet 1   ondansetron (ZOFRAN-ODT) 4 MG disintegrating tablet Take 1 tablet (4 mg total) by mouth every 8 (eight) hours as needed for nausea or vomiting. 60 tablet 3   TRULICITY 4.5 NW/2.9FA  SOPN INJECT 4.5 MG AS DIRECTED ONCE A WEEK. 12 mL 3   No current facility-administered medications on file prior to visit.     Review of Systems  Constitutional:  Negative for chills and fever.  Respiratory:  Negative for cough, shortness of breath and wheezing.   Cardiovascular:  Positive for leg swelling (minimal). Negative for chest pain and palpitations.  Neurological:  Positive for headaches (stress). Negative for light-headedness.       Objective:   Vitals:   10/28/21 0946  BP: 128/88  Pulse: 68  Temp: 98.5 F (36.9 C)  SpO2: 99%   BP Readings from Last 3 Encounters:  10/28/21 128/88  09/18/21 (!) 103/53  09/08/21 137/74   Wt Readings from Last 3 Encounters:  10/28/21 171 lb (77.6 kg)  09/08/21 171 lb (77.6 kg)  06/30/21 168 lb (76.2 kg)   Body mass index is 32.31 kg/m.    Physical Exam Constitutional:      General: She is not in acute distress.    Appearance: Normal appearance.  HENT:     Head: Normocephalic and atraumatic.  Eyes:     Conjunctiva/sclera: Conjunctivae normal.  Cardiovascular:     Rate and Rhythm: Normal rate and regular rhythm.     Heart sounds: Murmur (2/6 sys) heard.  Pulmonary:  Effort: Pulmonary effort is normal. No respiratory distress.     Breath sounds: Normal breath sounds. No wheezing.  Musculoskeletal:     Cervical back: Neck supple.     Right lower leg: No edema.     Left lower leg: No edema.  Lymphadenopathy:     Cervical: No cervical adenopathy.  Skin:    General: Skin is warm and dry.     Findings: No rash.  Neurological:     Mental Status: She is alert. Mental status is at baseline.  Psychiatric:        Mood and Affect: Mood normal.        Behavior: Behavior normal.        Lab Results  Component Value Date   WBC 5.7 09/08/2021   HGB 13.6 09/08/2021   HCT 41.0 09/08/2021   PLT 324 09/08/2021   GLUCOSE 146 (H) 09/08/2021   CHOL 243 (H) 04/29/2021   TRIG 94.0 04/29/2021   HDL 69.20 04/29/2021    LDLDIRECT 108.0 04/27/2019   LDLCALC 155 (H) 04/29/2021   ALT 13 06/30/2021   AST 15 06/30/2021   NA 137 09/08/2021   K 4.0 09/08/2021   CL 102 09/08/2021   CREATININE 0.84 09/08/2021   BUN 16 09/08/2021   CO2 27 09/08/2021   TSH 1.11 04/29/2021   HGBA1C 6.7 (H) 09/08/2021   MICROALBUR 3.5 (H) 02/04/2016     Assessment & Plan:    See Problem List for Assessment and Plan of chronic medical problems.

## 2021-10-27 NOTE — Patient Instructions (Addendum)
     Blood work was ordered.      Medications changes include :   none      Return in about 6 months (around 04/29/2022) for follow up.

## 2021-10-28 ENCOUNTER — Other Ambulatory Visit: Payer: Self-pay

## 2021-10-28 ENCOUNTER — Other Ambulatory Visit: Payer: Self-pay | Admitting: Internal Medicine

## 2021-10-28 ENCOUNTER — Encounter: Payer: Self-pay | Admitting: Internal Medicine

## 2021-10-28 ENCOUNTER — Ambulatory Visit (INDEPENDENT_AMBULATORY_CARE_PROVIDER_SITE_OTHER): Payer: Medicare Other | Admitting: Internal Medicine

## 2021-10-28 VITALS — BP 128/88 | HR 68 | Temp 98.5°F | Ht 61.0 in | Wt 171.0 lb

## 2021-10-28 DIAGNOSIS — I700 Atherosclerosis of aorta: Secondary | ICD-10-CM | POA: Diagnosis not present

## 2021-10-28 DIAGNOSIS — E0842 Diabetes mellitus due to underlying condition with diabetic polyneuropathy: Secondary | ICD-10-CM | POA: Diagnosis not present

## 2021-10-28 DIAGNOSIS — E1142 Type 2 diabetes mellitus with diabetic polyneuropathy: Secondary | ICD-10-CM

## 2021-10-28 DIAGNOSIS — I10 Essential (primary) hypertension: Secondary | ICD-10-CM

## 2021-10-28 DIAGNOSIS — E782 Mixed hyperlipidemia: Secondary | ICD-10-CM | POA: Diagnosis not present

## 2021-10-28 LAB — COMPREHENSIVE METABOLIC PANEL
ALT: 10 U/L (ref 0–35)
AST: 15 U/L (ref 0–37)
Albumin: 3.9 g/dL (ref 3.5–5.2)
Alkaline Phosphatase: 47 U/L (ref 39–117)
BUN: 14 mg/dL (ref 6–23)
CO2: 30 mEq/L (ref 19–32)
Calcium: 9.9 mg/dL (ref 8.4–10.5)
Chloride: 99 mEq/L (ref 96–112)
Creatinine, Ser: 0.98 mg/dL (ref 0.40–1.20)
GFR: 57.44 mL/min — ABNORMAL LOW (ref >60.00)
Glucose, Bld: 108 mg/dL — ABNORMAL HIGH (ref 70–99)
Potassium: 3.7 mEq/L (ref 3.5–5.1)
Sodium: 136 mEq/L (ref 135–145)
Total Bilirubin: 0.8 mg/dL (ref 0.2–1.2)
Total Protein: 6.6 g/dL (ref 6.0–8.3)

## 2021-10-28 LAB — CBC WITH DIFFERENTIAL/PLATELET
Basophils Absolute: 0 10*3/uL (ref 0.0–0.1)
Basophils Relative: 0.6 % (ref 0.0–3.0)
Eosinophils Absolute: 0.6 10*3/uL (ref 0.0–0.7)
Eosinophils Relative: 9.9 % — ABNORMAL HIGH (ref 0.0–5.0)
HCT: 34.7 % — ABNORMAL LOW (ref 36.0–46.0)
Hemoglobin: 11.8 g/dL — ABNORMAL LOW (ref 12.0–15.0)
Lymphocytes Relative: 30 % (ref 12.0–46.0)
Lymphs Abs: 1.8 10*3/uL (ref 0.7–4.0)
MCHC: 34 g/dL (ref 30.0–36.0)
MCV: 78.7 fl (ref 78.0–100.0)
Monocytes Absolute: 0.5 10*3/uL (ref 0.1–1.0)
Monocytes Relative: 7.7 % (ref 3.0–12.0)
Neutro Abs: 3.1 10*3/uL (ref 1.4–7.7)
Neutrophils Relative %: 51.8 % (ref 43.0–77.0)
Platelets: 302 10*3/uL (ref 150.0–400.0)
RBC: 4.41 Mil/uL (ref 3.87–5.11)
RDW: 13.9 % (ref 11.5–15.5)
WBC: 6 10*3/uL (ref 4.0–10.5)

## 2021-10-28 LAB — HEMOGLOBIN A1C: Hgb A1c MFr Bld: 6.7 % — ABNORMAL HIGH (ref 4.6–6.5)

## 2021-10-28 MED ORDER — ONETOUCH ULTRA VI STRP: ORAL_STRIP | 3 refills | Status: DC

## 2021-10-28 MED ORDER — ONETOUCH DELICA PLUS LANCET33G MISC: 3 refills | Status: DC

## 2021-10-28 MED ORDER — LOSARTAN POTASSIUM 100 MG PO TABS: 100.0000 mg | ORAL_TABLET | Freq: Every day | ORAL | 1 refills | Status: DC

## 2021-10-28 MED ORDER — HYDROCHLOROTHIAZIDE 25 MG PO TABS: 25.0000 mg | ORAL_TABLET | Freq: Every day | ORAL | 1 refills | Status: DC

## 2021-10-28 MED ORDER — GABAPENTIN 600 MG PO TABS: 1200.0000 mg | ORAL_TABLET | Freq: Every day | ORAL | 1 refills | Status: DC

## 2021-10-28 NOTE — Assessment & Plan Note (Addendum)
Chronic She stopped the statin due to liver enlargement seen on CT -- she refuses to take  It

## 2021-10-28 NOTE — Assessment & Plan Note (Signed)
Chronic Stopped statin due to liver enlargement and does not want to take it Encouraged healthy diet and regular exercise

## 2021-10-28 NOTE — Assessment & Plan Note (Signed)
Chronic  Lab Results  Component Value Date   HGBA1C 6.7 (H) 09/08/2021   Sugars controlled Testing sugars 1 times a day Check A1c Continue trulcity 4.5 mg weekly Stressed regular exercise, diabetic diet

## 2021-10-28 NOTE — Assessment & Plan Note (Signed)
Chronic Blood pressure well controlled CMP Continue hydrochlorothiazide 25 mg daily, losartan 100 mg daily 

## 2021-10-28 NOTE — Assessment & Plan Note (Signed)
Chronic Controlled Gabapentin 1200 mg at bedtime

## 2021-11-07 ENCOUNTER — Other Ambulatory Visit: Payer: Self-pay | Admitting: Internal Medicine

## 2021-11-21 ENCOUNTER — Other Ambulatory Visit: Payer: Self-pay | Admitting: Internal Medicine

## 2021-12-09 ENCOUNTER — Telehealth: Payer: Medicare Other

## 2021-12-22 DIAGNOSIS — H524 Presbyopia: Secondary | ICD-10-CM | POA: Diagnosis not present

## 2021-12-22 DIAGNOSIS — Z961 Presence of intraocular lens: Secondary | ICD-10-CM | POA: Diagnosis not present

## 2021-12-22 DIAGNOSIS — E119 Type 2 diabetes mellitus without complications: Secondary | ICD-10-CM | POA: Diagnosis not present

## 2021-12-22 DIAGNOSIS — H43812 Vitreous degeneration, left eye: Secondary | ICD-10-CM | POA: Diagnosis not present

## 2021-12-22 DIAGNOSIS — H35363 Drusen (degenerative) of macula, bilateral: Secondary | ICD-10-CM | POA: Diagnosis not present

## 2021-12-22 DIAGNOSIS — H5212 Myopia, left eye: Secondary | ICD-10-CM | POA: Diagnosis not present

## 2021-12-22 LAB — HM DIABETES EYE EXAM

## 2022-01-15 ENCOUNTER — Encounter: Payer: Self-pay | Admitting: Internal Medicine

## 2022-01-15 NOTE — Progress Notes (Signed)
Outside notes received. Information abstracted. Notes sent to scan.  

## 2022-02-11 DIAGNOSIS — Z1231 Encounter for screening mammogram for malignant neoplasm of breast: Secondary | ICD-10-CM | POA: Diagnosis not present

## 2022-02-11 LAB — HM MAMMOGRAPHY

## 2022-04-09 ENCOUNTER — Other Ambulatory Visit: Payer: Self-pay | Admitting: Internal Medicine

## 2022-04-09 DIAGNOSIS — I10 Essential (primary) hypertension: Secondary | ICD-10-CM

## 2022-04-23 ENCOUNTER — Other Ambulatory Visit: Payer: Self-pay | Admitting: Internal Medicine

## 2022-04-29 ENCOUNTER — Encounter: Payer: Self-pay | Admitting: Internal Medicine

## 2022-04-29 ENCOUNTER — Ambulatory Visit (INDEPENDENT_AMBULATORY_CARE_PROVIDER_SITE_OTHER): Payer: Medicare Other | Admitting: Internal Medicine

## 2022-04-29 VITALS — BP 126/86 | HR 71 | Temp 98.2°F | Ht 61.0 in | Wt 173.0 lb

## 2022-04-29 DIAGNOSIS — Z23 Encounter for immunization: Secondary | ICD-10-CM | POA: Diagnosis not present

## 2022-04-29 DIAGNOSIS — I10 Essential (primary) hypertension: Secondary | ICD-10-CM | POA: Diagnosis not present

## 2022-04-29 DIAGNOSIS — E0842 Diabetes mellitus due to underlying condition with diabetic polyneuropathy: Secondary | ICD-10-CM | POA: Diagnosis not present

## 2022-04-29 DIAGNOSIS — E1142 Type 2 diabetes mellitus with diabetic polyneuropathy: Secondary | ICD-10-CM | POA: Diagnosis not present

## 2022-04-29 DIAGNOSIS — S51831A Puncture wound without foreign body of right forearm, initial encounter: Secondary | ICD-10-CM

## 2022-04-29 DIAGNOSIS — E782 Mixed hyperlipidemia: Secondary | ICD-10-CM

## 2022-04-29 LAB — COMPREHENSIVE METABOLIC PANEL
ALT: 12 U/L (ref 0–35)
AST: 17 U/L (ref 0–37)
Albumin: 4.3 g/dL (ref 3.5–5.2)
Alkaline Phosphatase: 52 U/L (ref 39–117)
BUN: 11 mg/dL (ref 6–23)
CO2: 29 mEq/L (ref 19–32)
Calcium: 10.1 mg/dL (ref 8.4–10.5)
Chloride: 102 mEq/L (ref 96–112)
Creatinine, Ser: 0.89 mg/dL (ref 0.40–1.20)
GFR: 64.25 mL/min (ref >60.00)
Glucose, Bld: 100 mg/dL — ABNORMAL HIGH (ref 70–99)
Potassium: 4.3 mEq/L (ref 3.5–5.1)
Sodium: 138 mEq/L (ref 135–145)
Total Bilirubin: 1.5 mg/dL — ABNORMAL HIGH (ref 0.2–1.2)
Total Protein: 7.2 g/dL (ref 6.0–8.3)

## 2022-04-29 LAB — LIPID PANEL
Cholesterol: 232 mg/dL — ABNORMAL HIGH (ref 0–200)
HDL: 64.8 mg/dL (ref >39.00)
LDL Cholesterol: 142 mg/dL — ABNORMAL HIGH (ref 0–99)
NonHDL: 167.57
Total CHOL/HDL Ratio: 4
Triglycerides: 128 mg/dL (ref 0.0–149.0)
VLDL: 25.6 mg/dL (ref 0.0–40.0)

## 2022-04-29 LAB — MICROALBUMIN / CREATININE URINE RATIO
Creatinine,U: 97.4 mg/dL
Microalb Creat Ratio: 0.8 mg/g (ref 0.0–30.0)
Microalb, Ur: 0.8 mg/dL (ref 0.0–1.9)

## 2022-04-29 LAB — HEMOGLOBIN A1C: Hgb A1c MFr Bld: 6.7 % — ABNORMAL HIGH (ref 4.6–6.5)

## 2022-04-29 MED ORDER — EZETIMIBE 10 MG PO TABS: 10.0000 mg | ORAL_TABLET | Freq: Every day | ORAL | 3 refills | Status: DC

## 2022-04-29 NOTE — Assessment & Plan Note (Signed)
Acute Recent - from nail that was dirty Last tdap 2017 - will give her a td booster today given type of wound and circumstances Td today

## 2022-04-29 NOTE — Assessment & Plan Note (Addendum)
Chronic Refuses statin secondary to enlarged liver on CT scan Check lipid panel Advised Zetia  -  she agrees to start - zetia 10 mg daily sent to pharmacy

## 2022-04-29 NOTE — Assessment & Plan Note (Signed)
Chronic Blood pressure well controlled CMP Continue hydrochlorothiazide 25 mg daily, losartan 100 mg daily 

## 2022-04-29 NOTE — Progress Notes (Signed)
Subjective:    Patient ID: Heather [name], female    DOB: Jan 19, 1951, 71 y.o.   MRN: [id number]     HPI Heather [name] is here for follow up of her chronic medical problems, including diabetes with neuropathy, hypertension, hyperlipidemia    Having occasional nausea - intermittent - saw GI and taking zofran as needed.  If she does not take the zofran she will vomit.    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   Had recent puncture wound right forearm. Was a nail that was on the ground.  Last tdap 2017   Medications and allergies reviewed with patient and updated if appropriate.  Current Outpatient Medications on File Prior to Visit  Medication Sig Dispense Refill   Blood Glucose Monitoring Suppl (ONE TOUCH ULTRA 2) w/Device KIT 1 each by Does not apply route 4 (four) times daily. Use device to monitor glucose levels 4 times per day; E11.9 1 kit 0   diclofenac Sodium (VOLTAREN) 1 % GEL APPLY 4 GRAMS TO AFFECTED JOINTS 4 TIMES A DAY (Patient taking differently: Apply 4 g topically daily as needed (pain).) 400 g 11   famotidine (PEPCID) 10 MG tablet Take 10 mg by mouth daily as needed for heartburn or indigestion.     gabapentin (NEURONTIN) 600 MG tablet Take 2 tablets (1,200 mg total) by mouth at bedtime. 180 tablet 1   glucose blood (ONETOUCH ULTRA) test strip Test once daily. DX E11.42 100 strip 3   hydrochlorothiazide (HYDRODIURIL) 25 MG tablet TAKE 1 TABLET (25 MG TOTAL) BY MOUTH DAILY. 90 tablet 1   ibuprofen (ADVIL) 200 MG tablet Take 400 mg by mouth every 8 (eight) hours as needed for moderate pain.     Lancets (ONETOUCH DELICA PLUS [id number]) MISC USE TO TEST BLOOD SUGARS  ONCE DAILY 100 each 3   losartan (COZAAR) 100 MG tablet TAKE 1 TABLET BY MOUTH EVERY DAY 90 tablet 1   ondansetron (ZOFRAN-ODT) 4 MG disintegrating tablet Take 1 tablet (4 mg total) by mouth every 8 (eight) hours as needed for nausea or vomiting. 60 tablet 3   TRULICITY 4.5 TD/9.7CB  SOPN INJECT 4.5 MG AS DIRECTED ONCE A WEEK. 12 mL 3   No current facility-administered medications on file prior to visit.     Review of Systems  Constitutional:  Negative for fever.  Respiratory:  Positive for cough (chronic - dry - ? allergic). Negative for shortness of breath and wheezing.   Cardiovascular:  Negative for chest pain, palpitations and leg swelling (b/l feet swelling).  Skin:  Positive for wound (right anterior forearm).  Neurological:  Negative for light-headedness and headaches.       Objective:   Vitals:   04/29/22 0802  BP: 126/86  Pulse: 71  Temp: 98.2 F (36.8 C)  SpO2: 98%   BP Readings from Last 3 Encounters:  04/29/22 126/86  10/28/21 128/88  09/18/21 (!) 103/53   Wt Readings from Last 3 Encounters:  04/29/22 173 lb (78.5 kg)  10/28/21 171 lb (77.6 kg)  09/08/21 171 lb (77.6 kg)   Body mass index is 32.69 kg/m.    Physical Exam Constitutional:      General: She is not in acute distress.    Appearance: Normal appearance.  HENT:     Head: Normocephalic and atraumatic.  Eyes:     Conjunctiva/sclera: Conjunctivae normal.  Cardiovascular:     Rate and Rhythm: Normal rate and regular rhythm.  Heart sounds: Murmur (1/6 sys) heard.  Pulmonary:     Effort: Pulmonary effort is normal. No respiratory distress.     Breath sounds: Normal breath sounds. No wheezing.  Musculoskeletal:     Cervical back: Neck supple.     Right lower leg: No edema.     Left lower leg: No edema.  Lymphadenopathy:     Cervical: No cervical adenopathy.  Skin:    General: Skin is warm and dry.     Findings: No rash.     Comments: Healing puncture wound right forearm - no surrounding erythema or swelling, no fluctuance  Neurological:     Mental Status: She is alert. Mental status is at baseline.     Sensory: Sensory deficit present.  Psychiatric:        Mood and Affect: Mood normal.        Behavior: Behavior normal.        Lab Results  Component Value  Date   WBC 6.0 10/28/2021   HGB 11.8 (L) 10/28/2021   HCT 34.7 (L) 10/28/2021   PLT 302.0 10/28/2021   GLUCOSE 108 (H) 10/28/2021   CHOL 243 (H) 04/29/2021   TRIG 94.0 04/29/2021   HDL 69.20 04/29/2021   LDLDIRECT 108.0 04/27/2019   LDLCALC 155 (H) 04/29/2021   ALT 10 10/28/2021   AST 15 10/28/2021   NA 136 10/28/2021   K 3.7 10/28/2021   CL 99 10/28/2021   CREATININE 0.98 10/28/2021   BUN 14 10/28/2021   CO2 30 10/28/2021   TSH 1.11 04/29/2021   HGBA1C 6.7 (H) 10/28/2021   MICROALBUR 3.5 (H) 02/04/2016     Assessment & Plan:    See Problem List for Assessment and Plan of chronic medical problems.

## 2022-04-29 NOTE — Assessment & Plan Note (Signed)
Chronic Controlled Continue gabapentin 1200 mg at bedtime

## 2022-04-29 NOTE — Addendum Note (Signed)
Addended by: Vertell Novak on: 04/29/2022 08:39 AM   Modules accepted: Orders

## 2022-04-29 NOTE — Patient Instructions (Addendum)
      Tetanus booster given today   Blood work was ordered.   The lab is on the first floor.    Medications changes include :  zetia 10 mg daily      Return in about 6 months (around 10/29/2022) for Physical Exam.

## 2022-04-29 NOTE — Assessment & Plan Note (Addendum)
Chronic   Lab Results  Component Value Date   HGBA1C 6.7 (H) 10/28/2021   Sugars controlled Checks sugars - not daily - stable - concerned with low sugars - would benefit from dexam Check A1c, urine microalbumin today Continue Trulicity 4.5 mg weekly Stressed regular exercise, diabetic diet

## 2022-05-06 ENCOUNTER — Telehealth: Payer: Self-pay | Admitting: Internal Medicine

## 2022-05-06 NOTE — Telephone Encounter (Signed)
Marina from [facility] and said they needed a call back about a prescription they received and also medical records from 04/29/22, she said it was missing history of hipoglycema. Callback is [phone]

## 2022-05-15 ENCOUNTER — Encounter: Payer: Self-pay | Admitting: Internal Medicine

## 2022-05-15 NOTE — Progress Notes (Signed)
Outside notes received. Information abstracted. Notes sent to scan.  

## 2022-06-29 ENCOUNTER — Ambulatory Visit: Payer: Medicare Other

## 2022-07-03 ENCOUNTER — Encounter: Payer: Self-pay | Admitting: *Deleted

## 2022-07-03 ENCOUNTER — Ambulatory Visit (INDEPENDENT_AMBULATORY_CARE_PROVIDER_SITE_OTHER): Payer: Medicare Other | Admitting: *Deleted

## 2022-07-03 VITALS — Ht 61.0 in | Wt 168.1 lb

## 2022-07-03 DIAGNOSIS — Z0000 Encounter for general adult medical examination without abnormal findings: Secondary | ICD-10-CM | POA: Diagnosis not present

## 2022-07-03 NOTE — Patient Instructions (Signed)

## 2022-07-03 NOTE — Progress Notes (Signed)
Subjective:   Heather [name] is a 72 y.o. female who presents for Medicare Annual (Subsequent) preventive examination. I connected with  NAVIANA PANDIT on 07/03/22 by a audio enabled telemedicine application and verified that I am speaking with the correct person using two identifiers.  Patient Location: Home  Provider Location: Office/Clinic  I discussed the limitations of evaluation and management by telemedicine. The patient expressed understanding and agreed to proceed.   Review of Systems    Defer to PCP  Cardiac Risk Factors include: advanced age (>49mn, >>47women);diabetes mellitus;hypertension;sedentary lifestyle;obesity (BMI >30kg/m2)   Please see problem list for additional risk factor    Objective:    Today's Vitals   07/03/22 0905  Weight: 168 lb 2 oz (76.3 kg)  Height: 5' 1"$  (1.549 m)   Body mass index is 31.77 kg/m.     07/03/2022    9:16 AM 09/17/2021   12:00 PM 09/08/2021    7:52 AM 06/27/2021    9:24 AM 06/26/2020    9:24 AM 05/16/2018    8:32 AM 02/16/2017    8:50 AM  Advanced Directives  Does Patient Have a Medical Advance Directive? Yes No No Yes Yes No No  Type of AParamedicof [place]ampago[place] will   Living will H[place]aceyLiving will    Does patient want to make changes to medical advance directive?    No - Patient declined No - Patient declined    Would patient like information on creating a medical advance directive?  No - Patient declined    Yes (Inpatient - patient requests chaplain consult to create a medical advance directive) Yes (ED - Information included in AVS)    Current Medications (verified) Outpatient Encounter Medications as of 07/03/2022  Medication Sig   Blood Glucose Monitoring Suppl (ONE TOUCH ULTRA 2) w/Device KIT 1 each by Does not apply route 4 (four) times daily. Use device to monitor glucose levels 4 times per day; E11.9   diclofenac Sodium (VOLTAREN) 1 % GEL APPLY 4 GRAMS TO  AFFECTED JOINTS 4 TIMES A DAY (Patient taking differently: Apply 4 g topically daily as needed (pain).)   ezetimibe (ZETIA) 10 MG tablet Take 1 tablet (10 mg total) by mouth daily.   famotidine (PEPCID) 10 MG tablet Take 10 mg by mouth daily as needed for heartburn or indigestion.   gabapentin (NEURONTIN) 600 MG tablet Take 2 tablets (1,200 mg total) by mouth at bedtime.   glucose blood (ONETOUCH ULTRA) test strip Test once daily. DX E11.42   hydrochlorothiazide (HYDRODIURIL) 25 MG tablet TAKE 1 TABLET (25 MG TOTAL) BY MOUTH DAILY.   ibuprofen (ADVIL) 200 MG tablet Take 400 mg by mouth every 8 (eight) hours as needed for moderate pain.   Lancets (ONETOUCH DELICA PLUS [id number] MISC USE TO TEST BLOOD SUGARS  ONCE DAILY   losartan (COZAAR) 100 MG tablet TAKE 1 TABLET BY MOUTH EVERY DAY   ondansetron (ZOFRAN-ODT) 4 MG disintegrating tablet Take 1 tablet (4 mg total) by mouth every 8 (eight) hours as needed for nausea or vomiting.   TRULICITY 4.5 M0000000SOPN INJECT 4.5 MG AS DIRECTED ONCE A WEEK.   No facility-administered encounter medications on file as of 07/03/2022.    Allergies (verified) Tramadol, Farxiga [dapagliflozin], Flagyl [metronidazole], Jardiance [empagliflozin], Lisinopril, Cholestyramine, Prandin [repaglinide], and Sulfonamide derivatives   History: Past Medical History:  Diagnosis Date   Arthritis    Colon polyps    Diabetes mellitus without complication ([place]  Environmental allergies    GERD (gastroesophageal reflux disease)    Heart murmur    since birth never a problem per pt   History of DVT (deep vein thrombosis) 01/16/1969   History of hiatal hernia    History of transfusion    Hyperlipidemia    Hypertension    Interstitial cystitis    Dr Janice Norrie   PONV (postoperative nausea and vomiting)    Past Surgical History:  Procedure Laterality Date   APPENDECTOMY     BILATERAL SALPINGOOPHORECTOMY     painful cysts   CATARACT EXTRACTION, BILATERAL      COLONOSCOPY  2005   negative; Dr Earlean Shawl. F/U declined   CYSTOSCOPY     X 4; Dr Janice Norrie   INSERTION OF MESH N/A 09/17/2021   Procedure: INSERTION OF MESH;  Surgeon: Michael Boston, MD;  Location: [facility];  Service: General;  Laterality: N/A;   LAPAROSCOPIC RIGHT COLECTOMY Right 08/19/2015   Procedure: LAPAROSCOPIC ASSISTED  RIGHT COLECTOMY;  Surgeon: Alphonsa Overall, MD;  Location: [facility];  Service: General;  Laterality: Right;   LYSIS OF ADHESION N/A 09/17/2021   Procedure: LYSIS OF ADHESION;  Surgeon: Michael Boston, MD;  Location: [facility];  Service: General;  Laterality: N/A;   PREMAGLINANT POLYPS     TOTAL ABDOMINAL HYSTERECTOMY     metromenorrhagia   VENTRAL HERNIA REPAIR N/A 09/17/2021   Procedure: LAPAROSCOPIC VENTRAL WALL HERNIA REPAIR BILATERAL TAP BLOCK;  Surgeon: Michael Boston, MD;  Location: [facility];  Service: General;  Laterality: N/A;   Family History  Problem Relation Age of Onset   Heart attack Mother        in 54s   Hypertension Mother    Stroke Mother         in 73s   Other Father        Deceased, 47   Breast cancer Sister    Healthy Brother    Healthy Daughter    Healthy Son    Stroke Maternal Grandfather 60   Heart attack Paternal Grandfather        in 86s   Diabetes Neg Hx    Social History   Socioeconomic History   Marital status: Widowed    Spouse name: Not on file   Number of children: 2   Years of education: Not on file   Highest education level: Not on file  Occupational History   Not on file  Tobacco Use   Smoking status: Former    Types: Cigarettes    Quit date: 05/18/1984    Years since quitting: 38.1   Smokeless tobacco: Never   Tobacco comments:    smoked 1972-1986, up to 1 ppd  Vaping Use   Vaping Use: Never used  Substance and Sexual Activity   Alcohol use: No    Alcohol/week: 0.0 standard drinks of alcohol   Drug use: No   Sexual activity: Never  Other Topics Concern   Not on file  Social History Narrative   Lives with brother, daughter and 2  grandchildren in a 3 story home.  Has 2 children.  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     Highest level of education:  2 years of graduate school   Social Determinants of Health   Financial Resource Strain: Low Risk  (07/03/2022)   Overall Financial Resource Strain (CARDIA)    Difficulty of Paying Living Expenses: Not hard at all  Food Insecurity: No  Food Insecurity (07/03/2022)   Hunger Vital Sign    Worried About Running Out of Food in the Last Year: Never true    Ran Out of Food in the Last Year: Never true  Transportation Needs: No Transportation Needs (07/03/2022)   PRAPARE - Hydrologist (Medical): No    Lack of Transportation (Non-Medical): No  Physical Activity: Inactive (07/03/2022)   Exercise Vital Sign    Days of Exercise per Week: 0 days    Minutes of Exercise per Session: 0 min  Stress: No Stress Concern Present (06/27/2021)   [place]    Feeling of Stress : Not at all  Social Connections: Socially Isolated (07/03/2022)   Social Connection and Isolation Panel [NHANES]    Frequency of Communication with Friends and Family: More than three times a week    Frequency of Social Gatherings with Friends and Family: Once a week    Attends Religious Services: Never    Marine scientist or Organizations: No    Attends Archivist Meetings: Never    Marital Status: Widowed    Tobacco Counseling Counseling given: Not Answered Tobacco comments: smoked 1972-1986, up to 1 ppd   Clinical Intake:  Pre-visit preparation completed: Yes  Pain : No/denies pain     Nutritional Status: BMI > 30  Obese Nutritional Risks: None Diabetes: Yes CBG done?: No  How often do you need to have someone help you when you read instructions, pamphlets, or other written materials from your doctor or pharmacy?: 1 -  Never  Diabetic?Nutrition Risk Assessment:  Has the patient had any N/V/D within the last 2 months?  No  Does the patient have any non-healing wounds?  No  Has the patient had any unintentional weight loss or weight gain?  No   Diabetes:  Is the patient diabetic?  Yes  If diabetic, was a CBG obtained today?  No  Did the patient bring in their glucometer from home?  No  How often do you monitor your CBG's? 1-2 times a week.   Financial Strains and Diabetes Management:  Are you having any financial strains with the device, your supplies or your medication? No .  Does the patient want to be seen by Chronic Care Management for management of their diabetes?  No  Would the patient like to be referred to a Nutritionist or for Diabetic Management?  No   Diabetic Exams:  Diabetic Eye Exam: Completed 12/22/2021 Diabetic Foot Exam: Overdue, Pt has been advised about the importance in completing this exam. Pt is scheduled for diabetic foot exam on 10/29/2022.   Interpreter Needed?: No      Activities of Daily Living    07/03/2022    9:23 AM 07/03/2022    9:19 AM  In your present state of health, do you have any difficulty performing the following activities:  Hearing?  0  Vision?  0  Difficulty concentrating or making decisions?  0  Walking or climbing stairs?  0  Dressing or bathing?  0  Doing errands, shopping?  0  Preparing Food and eating ? N   Using the Toilet? N   In the past six months, have you accidently leaked urine? N   Do you have problems with loss of bowel control? N   Managing your Medications? N   Managing your Finances? N   Housekeeping or managing your Housekeeping? N     Patient  Care Team: Binnie Rail, MD as PCP - General (Internal Medicine) Richmond Campbell, MD as Consulting Physician (Gastroenterology) Renato Shin, MD (Inactive) as Consulting Physician (Endocrinology) Szabat, Darnelle Maffucci, [facility] (Inactive) (Pharmacist) Camillo Flaming, OD as Referring  Physician (Optometry) Armbruster, Carlota Raspberry, MD as Consulting Physician (Gastroenterology) Michael Boston, MD as Consulting Physician (General Surgery)  Indicate any recent Medical Services you may have received from other than [facility] providers in the past year (date may be approximate).     Assessment:   This is a routine wellness examination for Rowen.  Hearing/Vision screen Vision Screening - Comments:: Annual eye exam   Dietary issues and exercise activities discussed:     Goals Addressed   None   Depression Screen    07/03/2022    9:18 AM 07/03/2022    9:16 AM 04/29/2022    8:14 AM 10/28/2021    9:57 AM 06/27/2021    9:37 AM 03/24/2021   10:33 AM 01/22/2021   10:11 AM  PHQ 2/9 Scores  PHQ - 2 Score 0 0 0 2 0 0 3  PHQ- 9 Score   2 8   11    $ Fall Risk    07/03/2022    9:16 AM 04/29/2022    8:05 AM 10/28/2021    9:57 AM 06/27/2021    9:33 AM 06/26/2020    9:25 AM  Fall Risk   Falls in the past year? 0 0 1 1 0  Number falls in past yr: 0 0 0 0 0  Injury with Fall? 0 0 1 1 0  Risk for fall due to : No Fall Risks No Fall Risks No Fall Risks History of fall(s);Impaired balance/gait Impaired balance/gait;Orthopedic patient  Follow up Falls evaluation completed Falls evaluation completed Falls evaluation completed Falls evaluation completed Falls evaluation completed    Plush:  Any stairs in or around the home? Yes  If so, are there any without handrails? Yes  Home free of loose throw rugs in walkways, pet beds, electrical cords, etc? Yes  Adequate lighting in your home to reduce risk of falls? Yes   ASSISTIVE DEVICES UTILIZED TO PREVENT FALLS:  Life alert? No  Use of a cane, walker or w/c? No  Grab bars in the bathroom? Yes  Shower chair or bench in shower? Yes  Elevated toilet seat or a handicapped toilet? Yes   TIMED UP AND GO:  Was the test performed? No .  Length of time to ambulate 10 feet: n/a sec.     Cognitive  Function:    02/16/2017    8:53 AM  MMSE - Mini Mental State Exam  Orientation to time 5  Orientation to Place 5  Registration 3  Attention/ Calculation 5  Recall 2  Language- name 2 objects 2  Language- repeat 1  Language- follow 3 step command 3  Language- read & follow direction 1  Write a sentence 1  Copy design 1  Total score 29        07/03/2022    9:19 AM  6CIT Screen  What Year? 0 points  What month? 0 points  What time? 0 points  Count back from 20 0 points  Months in reverse 0 points  Repeat phrase 0 points  Total Score 0 points    Immunizations Immunization History  Administered Date(s) Administered   Hepatitis A 11/14/2003   Influenza Whole 01/17/2012   Influenza, High Dose Seasonal PF 02/20/2014, 01/15/2018, 12/26/2018, 01/15/2020, 01/17/2021, 02/10/2022  Influenza-Unspecified 02/24/2013, 02/19/2015, 01/24/2016, 01/07/2017   PFIZER Comirnaty(Gray Top)Covid-19 Tri-Sucrose Vaccine 08/21/2020, 02/13/2022   PFIZER(Purple Top)SARS-COV-2 Vaccination 06/06/2019, 06/24/2019, 02/13/2020   Pfizer Covid-19 Vaccine Bivalent Booster 68yr & up 01/23/2021, 09/09/2021   Pneumococcal Conjugate-13 03/07/2014   Pneumococcal Polysaccharide-23 08/02/2015   Respiratory Syncytial Virus Vaccine,Recomb Aduvanted(Arexvy) 03/02/2022   Td 06/11/2006, 04/29/2022   Tdap 02/04/2016   Zoster Recombinat (Shingrix) 09/08/2016, 03/08/2017   Zoster, Live 03/21/2014    TDAP status: Up to date  Flu Vaccine status: Up to date  Pneumococcal vaccine status: Up to date  Covid-19 vaccine status: Completed vaccines  Qualifies for Shingles Vaccine? Yes   Zostavax completed Yes   Shingrix Completed?: Yes  Screening Tests Health Maintenance  Topic Date Due   FOOT EXAM  04/25/2021   COVID-19 Vaccine (8 - 2023-24 season) 04/10/2022   HEMOGLOBIN A1C  10/29/2022   OPHTHALMOLOGY EXAM  12/23/2022   Diabetic kidney evaluation - eGFR measurement  04/30/2023   Diabetic kidney evaluation -  Urine ACR  04/30/2023   Medicare Annual Wellness (AWV)  07/04/2023   MAMMOGRAM  02/12/2024   DEXA SCAN  07/02/2024   COLONOSCOPY (Pts 45-466yrInsurance coverage will need to be confirmed)  08/17/2027   DTaP/Tdap/Td (4 - Td or Tdap) 04/29/2032   Pneumonia Vaccine 74Years old  Completed   INFLUENZA VACCINE  Completed   Hepatitis C Screening  Completed   Zoster Vaccines- Shingrix  Completed   HPV VACCINES  Aged Out    Health Maintenance  Health Maintenance Due  Topic Date Due   FOOT EXAM  04/25/2021   COVID-19 Vaccine (8 - 2023-24 season) 04/10/2022    Colorectal cancer screening: Type of screening: Colonoscopy. Completed 09/03/2017. Repeat every 10 years  Mammogram status: Completed 02/11/2022. Repeat every year  Bone Density status: Completed 07/02/2021. Results reflect: Bone density results: OSTEOPENIA. Repeat every 2 years.  Lung Cancer Screening: (Low Dose CT Chest recommended if Age 74-80ears, 30 pack-year currently smoking OR have quit w/in 15years.) does not qualify.   Lung Cancer Screening Referral: n/a  Additional Screening:  Hepatitis C Screening: does qualify; Completed 02/04/2016  Vision Screening: Recommended annual ophthalmology exams for early detection of glaucoma and other disorders of the eye. Is the patient up to date with their annual eye exam?  Yes  Who is the provider or what is the name of the office in which the patient attends annual eye exams? [facility] If pt is not established with a provider, would they like to be referred to a provider to establish care? No .   Dental Screening: Recommended annual dental exams for proper oral hygiene  Community Resource Referral / Chronic Care Management: CRR required this visit?  No   CCM required this visit?  No      Plan:     I have personally reviewed and noted the following in the patient's chart:   Medical and social history Use of alcohol, tobacco or illicit drugs  Current  medications and supplements including opioid prescriptions. Patient is not currently taking opioid prescriptions. Functional ability and status Nutritional status Physical activity Advanced directives List of other physicians Hospitalizations, surgeries, and ER visits in previous 12 months Vitals Screenings to include cognitive, depression, and falls Referrals and appointments  In addition, I have reviewed and discussed with patient certain preventive protocols, quality metrics, and best practice recommendations. A written personalized care plan for preventive services as well as general preventive health recommendations were provided to patient.  Mylinda Latina, [place]   07/03/2022  Non face to face 45 minutes Nurse Notes:  Ms. Garay , Thank you for taking time to come for your Medicare Wellness Visit. I appreciate your ongoing commitment to your health goals. Please review the following plan we discussed and let me know if I can assist you in the future.   These are the goals we discussed:  Goals       Find Help in My Community (pt-stated)      Timeframe:  Short-Term Goal Priority:  High Start Date:     12/18/20                        Expected End Date:   01/30/21                    Follow Up Date 01/17/21  -expect phone call from [place] with resources to help   - begin a notebook of services in my neighborhood or community - call 211 when I need some help - follow-up on any referrals for help I am given - make a list of family or friends that I can call    Why is this important?   Knowing how and where to find help for yourself or family in your neighborhood and community is an important skill.  You will want to take some steps to learn how.    Notes:       Manage My Medicine      Timeframe:  Long-Range Goal Priority:  High Start Date:     11/25/20                        Expected End Date:   02/24/22             Follow Up Date April 2023   - call for  medicine refill 2 or 3 days before it runs out - call if I am sick and can't take my medicine - keep a list of all the medicines I take; vitamins and herbals too - use a pillbox to sort medicine    Why is this important?   These steps will help you keep on track with your medicines.   Notes:       Patient Stated      Take time just for me and go through old photographs, food at my antiques, go shopping at the book. Start to go to the senior center and increase the amount of socialization I do.      Patient Stated      Continue with the M[facility] program at [facility].      Stay as  healthy as independent as possible      I want to start [place] and do activities at the [facility]         This is a list of the screening recommended for you and due dates:  Health Maintenance  Topic Date Due   Complete foot exam   04/25/2021   COVID-19 Vaccine (8 - 2023-24 season) 04/10/2022   Hemoglobin A1C  10/29/2022   Eye exam for diabetics  12/23/2022   Yearly kidney function blood test for diabetes  04/30/2023   Yearly kidney health urinalysis for diabetes  04/30/2023   Medicare Annual Wellness Visit  07/04/2023   Mammogram  02/12/2024   DEXA scan (bone density measurement)  07/02/2024   Colon Cancer  Screening  08/17/2027   DTaP/Tdap/Td vaccine (4 - Td or Tdap) 04/29/2032   Pneumonia Vaccine  Completed   Flu Shot  Completed   Hepatitis C Screening: USPSTF Recommendation to screen - Ages 18-79 yo.  Completed   Zoster (Shingles) Vaccine  Completed   HPV Vaccine  Aged Out

## 2022-09-02 IMAGING — CT CT ABD-PELV W/ CM
3 of 5 series · 16 of 46 positions shown, 18 images · IV contrast (APPLIED)
Comparison: None.

CLINICAL DATA: Ventral hernia, abdominal pain.

EXAM:
CT ABDOMEN AND PELVIS WITH CONTRAST
TECHNIQUE: Multidetector CT imaging of the abdomen and pelvis was performed
using the standard protocol following bolus administration of
intravenous contrast.

[Series 2: axial st · axial · 0.71mm/px · z∈[-410,-60]mm · 11 of 85 slices shown, 13 images]
[im 8/85  soft-tissue]
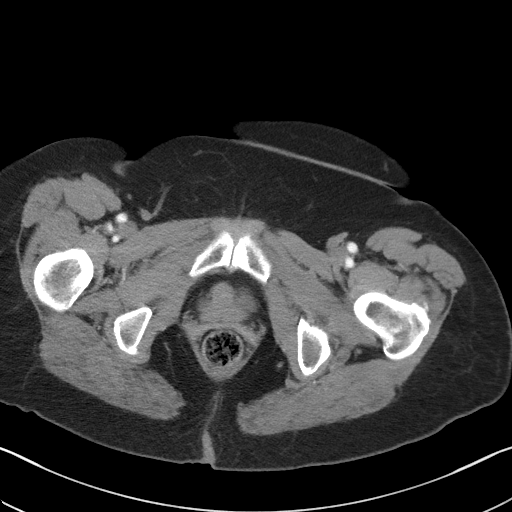
[im 8/85  bone]
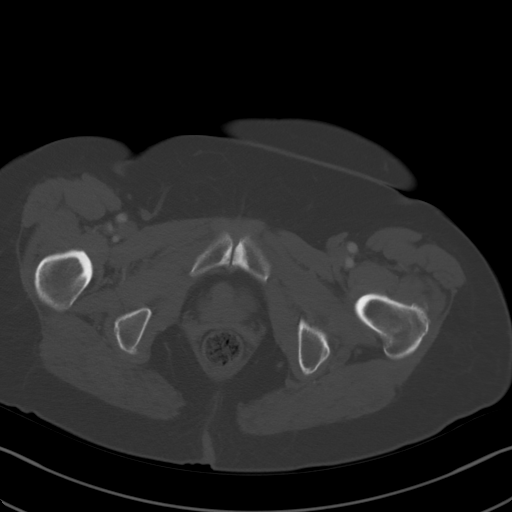
[im 15/85  soft-tissue]
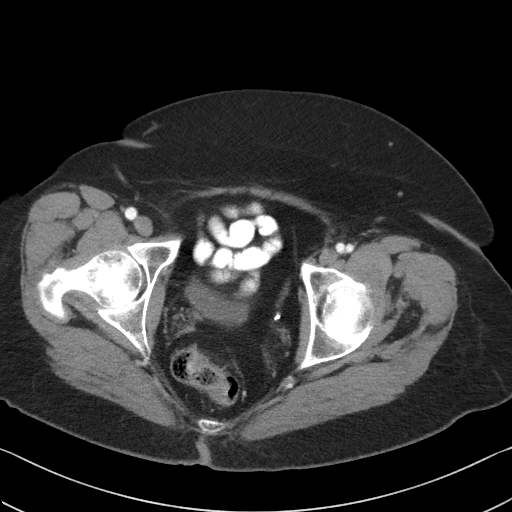
[im 22/85  soft-tissue]
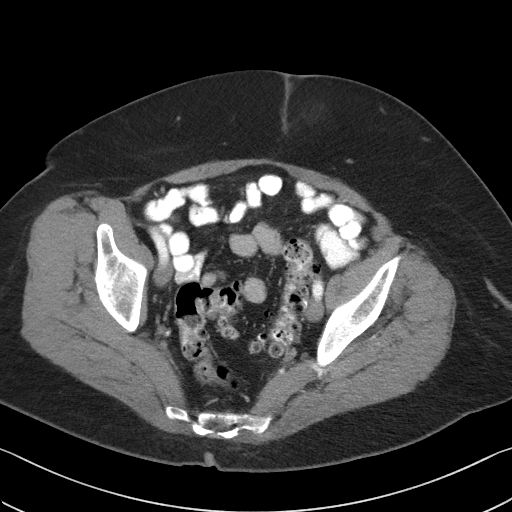
[im 29/85  soft-tissue]
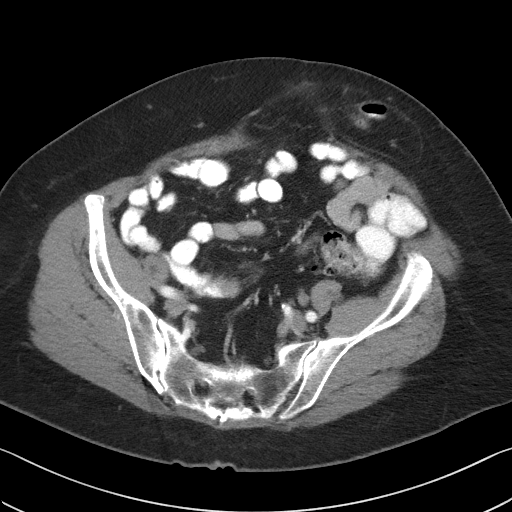
[im 36/85  soft-tissue]
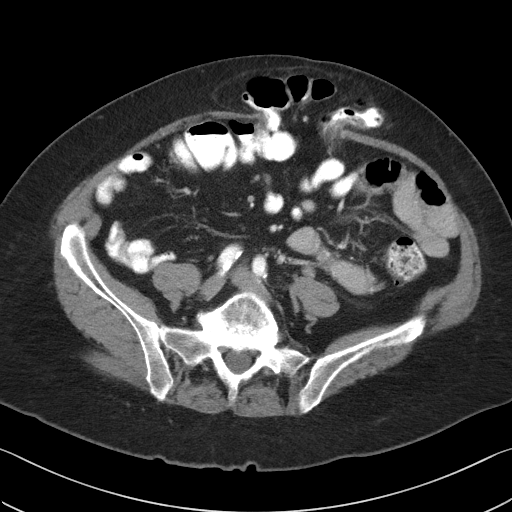
[im 43/85  soft-tissue]
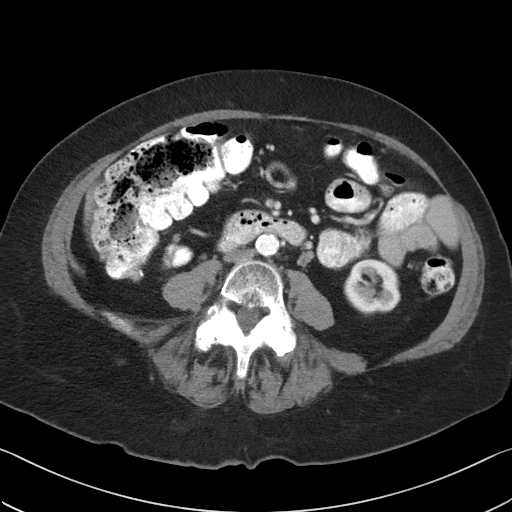
[im 50/85  soft-tissue]
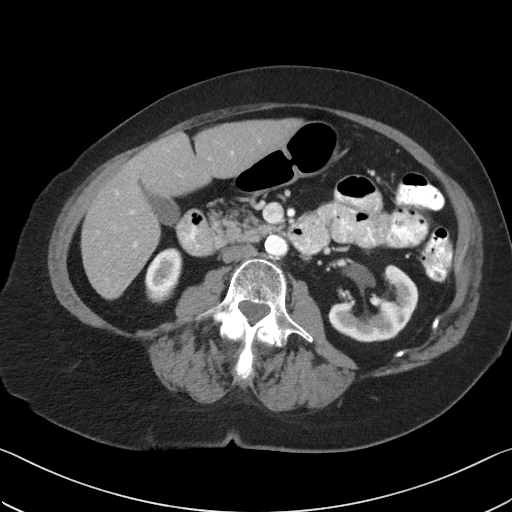
[im 57/85  soft-tissue]
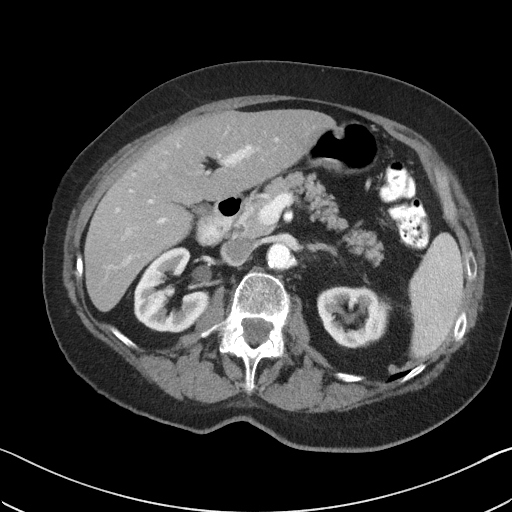
[im 64/85  soft-tissue]
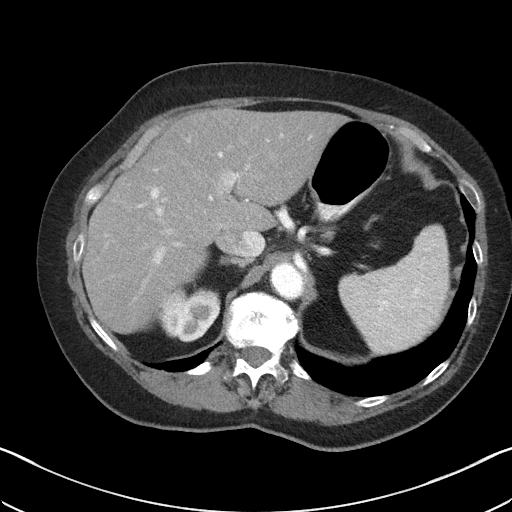
[im 64/85  bone]
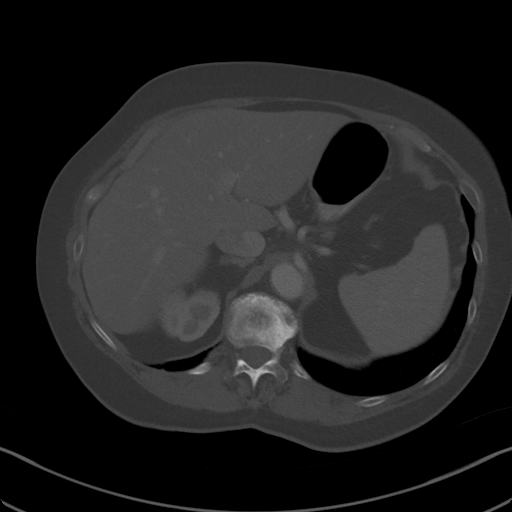
[im 71/85  soft-tissue]
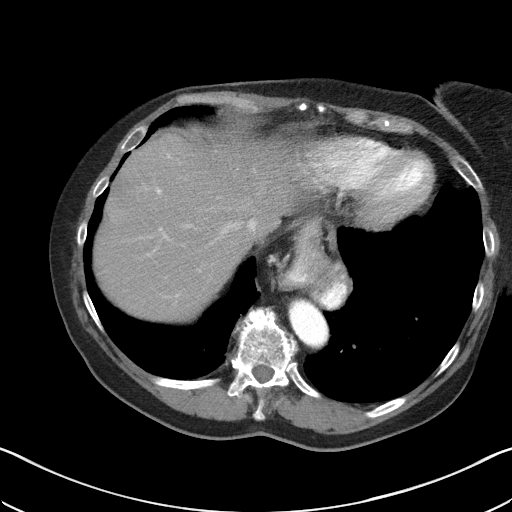
[im 78/85  soft-tissue]
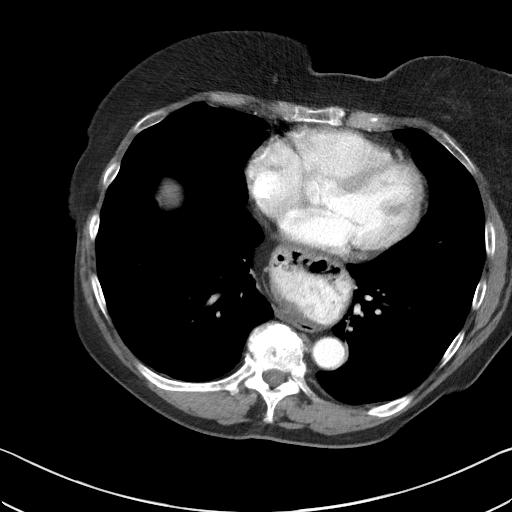

[Series 4: coronal st · coronal · 0.75mm/px · 3 of 93 slices shown]
[im 31/93  soft-tissue]
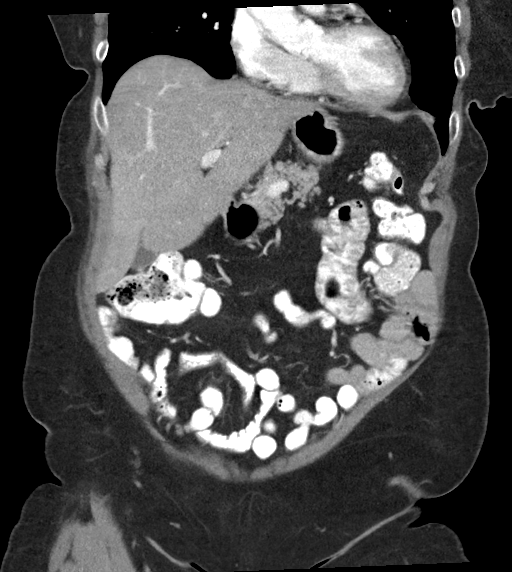
[im 41/93  soft-tissue]
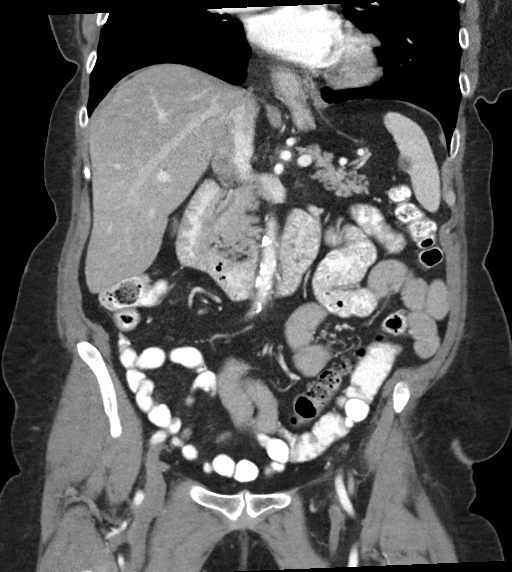
[im 52/93  soft-tissue]
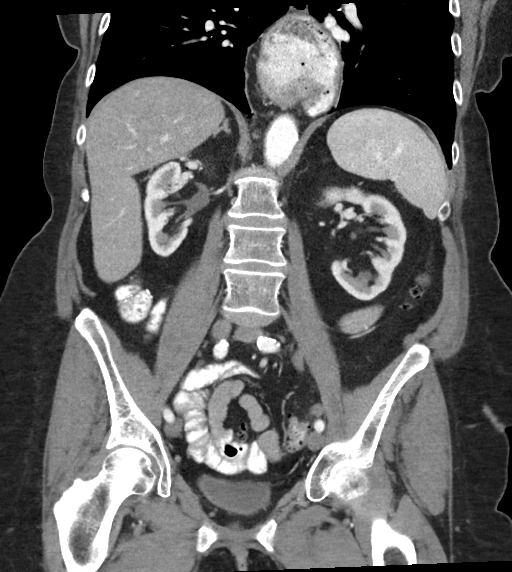

[Series 6: lung bases · axial · 0.71mm/px · z∈[-167,-141]mm · 2 of 78 slices shown]
[im 7/78  bone]
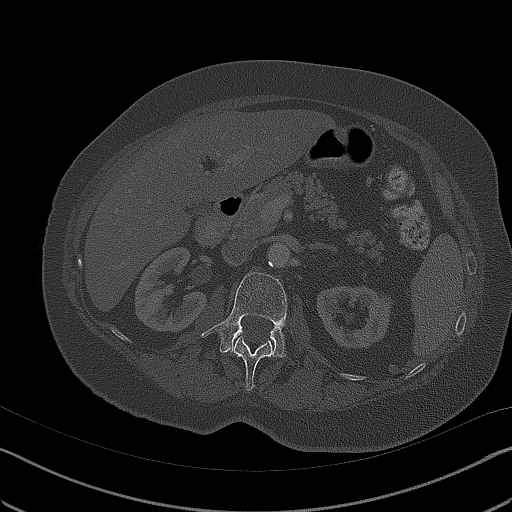
[im 20/78  bone]
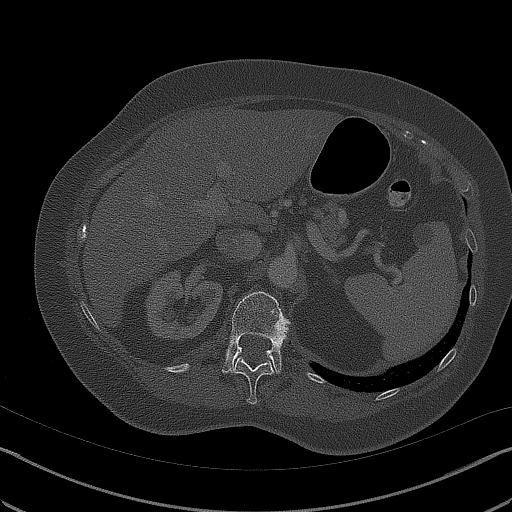

[16 of 46 positions shown; findings below may reference images not displayed]

RADIATION DOSE REDUCTION: This exam was performed according to the
departmental dose-optimization program which includes automated
exposure control, adjustment of the mA and/or kV according to
patient size and/or use of iterative reconstruction technique.

CONTRAST:  100mL OMNIPAQUE IOHEXOL 300 MG/ML  SOLN
FINDINGS: Lower chest: Mild scattered scarring in the lung bases. Heart size
normal. No pericardial or pleural effusion. Moderate hiatal hernia.

Hepatobiliary: Liver is decreased in attenuation diffusely and is
enlarged, 18.3 cm. There may be millimetric low-attenuation lesions
in the dome of the liver, too small to characterize. Liver and
gallbladder are otherwise unremarkable. No biliary ductal
dilatation.

Pancreas: Negative.

Spleen: Well-circumscribed 1.9 cm low-attenuation lesion in the
anterior spleen is likely a cyst. Otherwise negative.

Adrenals/Urinary Tract: Adrenal glands are unremarkable.
Subcentimeter low-attenuation lesions in the kidneys are too small
to characterize but statistically, cysts are likely. Kidneys are
otherwise unremarkable. Ureters are decompressed. Bladder is low in
volume.

Stomach/Bowel: Moderate hiatal hernia. Stomach and small bowel are
unremarkable. Cecum and proximal ascending colon are somewhat
dilated to the level of a midline ventral pelvic hernia. Distal
ascending and proximal transverse colon extend into the hernia which
has a wide neck, 4.6 cm. Remainder of the colon is decompressed.

Vascular/Lymphatic: Atherosclerotic calcification of the aorta. No
pathologically enlarged lymph nodes.

Reproductive: Hysterectomy.  No adnexal mass.

Other: No free fluid. Midline ventral supraumbilical/pelvic wall
hernia, as discussed above, contains unobstructed distal ascending
and proximal transverse colon, has a wide neck and measures 4.5 x
13.8 cm. Large periumbilical fat containing hernia, also with a wide
neck, measures 5.8 x 6.5 cm. Small bilateral inguinal hernias
contain fat. Mesenteries and peritoneum are otherwise unremarkable.

Musculoskeletal: Degenerative changes in the spine. Grade 1
anterolisthesis of L4 on L5. No worrisome lytic or sclerotic
lesions.
IMPRESSION: 1. Large midline supraumbilical ventral pelvic wall hernia contains
unobstructed colon. Cecum and ascending colon are dilated proximally
but not obstructed.
2. Periumbilical fat containing hernia.
3. Steatotic enlarged liver.
4. Moderate hiatal hernia.
5.  Aortic atherosclerosis ([id number]-N30.0).

## 2022-09-09 ENCOUNTER — Ambulatory Visit (INDEPENDENT_AMBULATORY_CARE_PROVIDER_SITE_OTHER): Payer: Medicare Other | Admitting: Internal Medicine

## 2022-09-09 ENCOUNTER — Encounter: Payer: Self-pay | Admitting: Internal Medicine

## 2022-09-09 VITALS — BP 144/96 | HR 79 | Temp 98.1°F | Ht 61.0 in | Wt 169.1 lb

## 2022-09-09 DIAGNOSIS — Z7985 Long-term (current) use of injectable non-insulin antidiabetic drugs: Secondary | ICD-10-CM

## 2022-09-09 DIAGNOSIS — M5416 Radiculopathy, lumbar region: Secondary | ICD-10-CM | POA: Diagnosis not present

## 2022-09-09 DIAGNOSIS — E1142 Type 2 diabetes mellitus with diabetic polyneuropathy: Secondary | ICD-10-CM | POA: Diagnosis not present

## 2022-09-09 MED ORDER — PREDNISONE 20 MG PO TABS: 40.0000 mg | ORAL_TABLET | Freq: Every day | ORAL | 0 refills | Status: AC

## 2022-09-09 NOTE — Progress Notes (Signed)
Subjective:    Patient ID: Heather [name], female    DOB: 31-Jul-1950, 72 y.o.   MRN: [id number]      HPI Heather [name] is here for  Chief Complaint  Patient presents with   Hip Pain    Right hip pain radiate all the way to the knee, comes and goes, does not matter the activity she is doing, worse when she start or begins any movement. Think is the after effect of her fall of last year. Tried hot pad, cold pack      Pain in right hip running down leg- it started 3 weeks ago - the pain started on the side of her right hip bone and radiates down to the knee - feels like it goes to behind the knee cap.  The pain is sharp and intermittent - it catches her breath. The pain lasts hours - it keeps coming an going.   No N/T.  No lower back pain.  She can not put weight on her leg.   Movement can make it worse, turning over in bed can cause the pain.  Having difficulty doing ADL's  When it grabs her she almost goes down.  Has tried hot, cold packs, diclofenac, and tens unit.  She does take the gabapentin at night - she thinks that helps.  Takes 3 advil as needed - take tylenol.    Medications and allergies reviewed with patient and updated if appropriate.  Current Outpatient Medications on File Prior to Visit  Medication Sig Dispense Refill   Blood Glucose Monitoring Suppl (ONE TOUCH ULTRA 2) w/Device KIT 1 each by Does not apply route 4 (four) times daily. Use device to monitor glucose levels 4 times per day; E11.9 1 kit 0   diclofenac Sodium (VOLTAREN) 1 % GEL APPLY 4 GRAMS TO AFFECTED JOINTS 4 TIMES A DAY (Patient taking differently: Apply 4 g topically daily as needed (pain).) 400 g 11   ezetimibe (ZETIA) 10 MG tablet Take 1 tablet (10 mg total) by mouth daily. 90 tablet 3   famotidine (PEPCID) 10 MG tablet Take 10 mg by mouth daily as needed for heartburn or indigestion.     gabapentin (NEURONTIN) 600 MG tablet Take 2 tablets (1,200 mg total) by mouth at bedtime. 180 tablet 1   glucose  blood (ONETOUCH ULTRA) test strip Test once daily. DX E11.42 100 strip 3   hydrochlorothiazide (HYDRODIURIL) 25 MG tablet TAKE 1 TABLET (25 MG TOTAL) BY MOUTH DAILY. 90 tablet 1   ibuprofen (ADVIL) 200 MG tablet Take 400 mg by mouth every 8 (eight) hours as needed for moderate pain.     Lancets (ONETOUCH DELICA PLUS LANCET33G) MISC USE TO TEST BLOOD SUGARS  ONCE DAILY 100 each 3   losartan (COZAAR) 100 MG tablet TAKE 1 TABLET BY MOUTH EVERY DAY 90 tablet 1   ondansetron (ZOFRAN-ODT) 4 MG disintegrating tablet Take 1 tablet (4 mg total) by mouth every 8 (eight) hours as needed for nausea or vomiting. 60 tablet 3   TRULICITY 4.5 MG/0.5ML SOPN INJECT 4.5 MG AS DIRECTED ONCE A WEEK. 12 mL 3   No current facility-administered medications on file prior to visit.    Review of Systems     Objective:   Vitals:   09/09/22 1432  BP: (!) 144/96  Pulse: 79  Temp: 98.1 F (36.7 C)  SpO2: 98%   BP Readings from Last 3 Encounters:  09/09/22 (!) 144/96  04/29/22 126/86  10/28/21 128/88  Wt Readings from Last 3 Encounters:  09/09/22 169 lb 2 oz (76.7 kg)  07/03/22 168 lb 2 oz (76.3 kg)  04/29/22 173 lb (78.5 kg)   Body mass index is 31.96 kg/m.    Physical Exam Constitutional:      General: She is not in acute distress.    Appearance: Normal appearance. She is not ill-appearing.  HENT:     Head: Normocephalic and atraumatic.  Musculoskeletal:        General: No swelling, tenderness (no tenderness in lower back, lateteral hip or right leg) or deformity.     Right lower leg: No edema.     Left lower leg: No edema.  Skin:    General: Skin is warm and dry.     Findings: No erythema or rash.  Neurological:     Mental Status: She is alert.     Sensory: No sensory deficit.     Motor: No weakness.     Gait: Gait abnormal (unable to put weight on leg).            Assessment & Plan:    See Problem List for Assessment and Plan of chronic medical problems.

## 2022-09-09 NOTE — Patient Instructions (Addendum)
       Medications changes include :   prednisone 40 mg daily x 5 days - take with food     Return if symptoms worsen or fail to improve.

## 2022-09-09 NOTE — Assessment & Plan Note (Signed)
Chronic   Lab Results  Component Value Date   HGBA1C 6.7 (H) 04/29/2022   Sugars controlled Knows risk of steroids but we feel benefits may outweigh risks Continue Trulicity 4.5 mg weekly

## 2022-09-09 NOTE — Assessment & Plan Note (Addendum)
Acute - started 3 days ago Symptoms c/w radiculopathy Discussed treatment options Prednisone 40 mg daily x 5 days - take with food- sugars are adequately controlled and I think this will hopefully help Deferred PT, pain medication Continue everything she is already doing Advised to f/u with there is no improvement

## 2022-09-14 ENCOUNTER — Other Ambulatory Visit: Payer: Self-pay | Admitting: Gastroenterology

## 2022-09-14 ENCOUNTER — Other Ambulatory Visit: Payer: Self-pay | Admitting: Internal Medicine

## 2022-09-24 ENCOUNTER — Telehealth: Payer: Self-pay | Admitting: Internal Medicine

## 2022-09-24 MED ORDER — OZEMPIC (0.25 OR 0.5 MG/DOSE) 2 MG/3ML ~~LOC~~ SOPN: 0.5000 mg | PEN_INJECTOR | SUBCUTANEOUS | 0 refills | Status: DC

## 2022-09-24 NOTE — Telephone Encounter (Signed)
Patient is out of her medication and the Trulicity is not available - she needs something else to be sent in.  Please call patient:  [phone]

## 2022-09-24 NOTE — Telephone Encounter (Signed)
We can try to see if her insurance will cover ozempic - we can start at the second dose of 0.5 mg weekly.  Rx sent to [facility]

## 2022-09-25 NOTE — Telephone Encounter (Signed)
Message left for patient today. 

## 2022-10-05 ENCOUNTER — Other Ambulatory Visit: Payer: Self-pay | Admitting: Internal Medicine

## 2022-10-05 DIAGNOSIS — I10 Essential (primary) hypertension: Secondary | ICD-10-CM

## 2022-10-07 ENCOUNTER — Encounter: Payer: Self-pay | Admitting: Internal Medicine

## 2022-10-15 ENCOUNTER — Other Ambulatory Visit: Payer: Self-pay | Admitting: Internal Medicine

## 2022-10-28 ENCOUNTER — Encounter: Payer: Self-pay | Admitting: Internal Medicine

## 2022-10-28 NOTE — Patient Instructions (Addendum)
Blood work was ordered.   The lab is on the first floor.    Medications changes include :   none      Return in about 6 months (around 04/30/2023) for follow up.    Health Maintenance, Female Adopting a healthy lifestyle and getting preventive care are important in promoting health and wellness. Ask your health care provider about: The right schedule for you to have regular tests and exams. Things you can do on your own to prevent diseases and keep yourself healthy. What should I know about diet, weight, and exercise? Eat a healthy diet  Eat a diet that includes plenty of vegetables, fruits, low-fat dairy products, and lean protein. Do not eat a lot of foods that are high in solid fats, added sugars, or sodium. Maintain a healthy weight Body mass index (BMI) is used to identify weight problems. It estimates body fat based on height and weight. Your health care provider can help determine your BMI and help you achieve or maintain a healthy weight. Get regular exercise Get regular exercise. This is one of the most important things you can do for your health. Most adults should: Exercise for at least 150 minutes each week. The exercise should increase your heart rate and make you sweat (moderate-intensity exercise). Do strengthening exercises at least twice a week. This is in addition to the moderate-intensity exercise. Spend less time sitting. Even light physical activity can be beneficial. Watch cholesterol and blood lipids Have your blood tested for lipids and cholesterol at 74 years of age, then have this test every 5 years. Have your cholesterol levels checked more often if: Your lipid or cholesterol levels are high. You are older than 74 years of age. You are at high risk for heart disease. What should I know about cancer screening? Depending on your health history and family history, you may need to have cancer screening at various ages. This may include screening  for: Breast cancer. Cervical cancer. Colorectal cancer. Skin cancer. Lung cancer. What should I know about heart disease, diabetes, and high blood pressure? Blood pressure and heart disease High blood pressure causes heart disease and increases the risk of stroke. This is more likely to develop in people who have high blood pressure readings or are overweight. Have your blood pressure checked: Every 3-5 years if you are 42-27 years of age. Every year if you are 57 years old or older. Diabetes Have regular diabetes screenings. This checks your fasting blood sugar level. Have the screening done: Once every three years after age 44 if you are at a normal weight and have a low risk for diabetes. More often and at a younger age if you are overweight or have a high risk for diabetes. What should I know about preventing infection? Hepatitis B If you have a higher risk for hepatitis B, you should be screened for this virus. Talk with your health care provider to find out if you are at risk for hepatitis B infection. Hepatitis C Testing is recommended for: Everyone born from [birth year] through 1965. Anyone with known risk factors for hepatitis C. Sexually transmitted infections (STIs) Get screened for STIs, including gonorrhea and chlamydia, if: You are sexually active and are younger than 74 years of age. You are older than 74 years of age and your health care provider tells you that you are at risk for this type of infection. Your sexual activity has changed since you were last screened, and you  are at increased risk for chlamydia or gonorrhea. Ask your health care provider if you are at risk. Ask your health care provider about whether you are at high risk for HIV. Your health care provider may recommend a prescription medicine to help prevent HIV infection. If you choose to take medicine to prevent HIV, you should first get tested for HIV. You should then be tested every 3 months for as long as you  are taking the medicine. Pregnancy If you are about to stop having your period (premenopausal) and you may become pregnant, seek counseling before you get pregnant. Take 400 to 800 micrograms (mcg) of folic acid every day if you become pregnant. Ask for birth control (contraception) if you want to prevent pregnancy. Osteoporosis and menopause Osteoporosis is a disease in which the bones lose minerals and strength with aging. This can result in bone fractures. If you are 50 years old or older, or if you are at risk for osteoporosis and fractures, ask your health care provider if you should: Be screened for bone loss. Take a calcium or vitamin D supplement to lower your risk of fractures. Be given hormone replacement therapy (HRT) to treat symptoms of menopause. Follow these instructions at home: Alcohol use Do not drink alcohol if: Your health care provider tells you not to drink. You are pregnant, may be pregnant, or are planning to become pregnant. If you drink alcohol: Limit how much you have to: 0-1 drink a day. Know how much alcohol is in your drink. In the [place] one drink equals one 12 oz bottle of beer (355 mL), one 5 oz glass of wine (148 mL), or one 1 oz glass of hard liquor (44 mL). Lifestyle Do not use any products that contain nicotine or tobacco. These products include cigarettes, chewing tobacco, and vaping devices, such as e-cigarettes. If you need help quitting, ask your health care provider. Do not use street drugs. Do not share needles. Ask your health care provider for help if you need support or information about quitting drugs. General instructions Schedule regular health, dental, and eye exams. Stay current with your vaccines. Tell your health care provider if: You often feel depressed. You have ever been abused or do not feel safe at home. Summary Adopting a healthy lifestyle and getting preventive care are important in promoting health and wellness. Follow your  health care provider's instructions about healthy diet, exercising, and getting tested or screened for diseases. Follow your health care provider's instructions on monitoring your cholesterol and blood pressure. This information is not intended to replace advice given to you by your health care provider. Make sure you discuss any questions you have with your health care provider. Document Revised: 09/23/2020 Document Reviewed: 09/23/2020 Elsevier Patient Education  2024 [employer].

## 2022-10-28 NOTE — Progress Notes (Signed)
Subjective:    Patient ID: Heather [name], female    DOB: 08-Feb-1951, 72 y.o.   MRN: [id number]      HPI Heather [name] is here for a Physical exam and her chronic medical problems.    Still lots of stress.  Frustrated that she can not get her trulicity.    Medications and allergies reviewed with patient and updated if appropriate.  Current Outpatient Medications on File Prior to Visit  Medication Sig Dispense Refill   Blood Glucose Monitoring Suppl (ONE TOUCH ULTRA 2) w/Device KIT 1 each by Does not apply route 4 (four) times daily. Use device to monitor glucose levels 4 times per day; E11.9 1 kit 0   diclofenac Sodium (VOLTAREN) 1 % GEL APPLY 4 GRAMS TO AFFECTED JOINTS 4 TIMES A DAY (Patient taking differently: Apply 4 g topically daily as needed (pain).) 400 g 11   ezetimibe (ZETIA) 10 MG tablet Take 1 tablet (10 mg total) by mouth daily. 90 tablet 3   famotidine (PEPCID) 10 MG tablet Take 10 mg by mouth daily as needed for heartburn or indigestion.     gabapentin (NEURONTIN) 600 MG tablet TAKE 2 TABLETS (1,200MG  TOTAL) BY MOUTH AT BEDTIME 180 tablet 1   glucose blood (ONETOUCH ULTRA) test strip Test once daily. DX E11.42 100 strip 3   hydrochlorothiazide (HYDRODIURIL) 25 MG tablet TAKE 1 TABLET (25 MG TOTAL) BY MOUTH DAILY. 90 tablet 1   ibuprofen (ADVIL) 200 MG tablet Take 400 mg by mouth every 8 (eight) hours as needed for moderate pain.     Lancets (ONETOUCH DELICA PLUS LANCET33G) MISC USE TO TEST BLOOD SUGARS  ONCE DAILY 100 each 3   losartan (COZAAR) 100 MG tablet TAKE 1 TABLET BY MOUTH EVERY DAY 90 tablet 1   ondansetron (ZOFRAN-ODT) 4 MG disintegrating tablet Take 1 tablet (4 mg total) by mouth every 8 (eight) hours as needed for nausea or vomiting. Please schedule an office visit for further refills. 20 tablet 0   No current facility-administered medications on file prior to visit.    Review of Systems  Constitutional:  Negative for fever.  Eyes:  Negative for visual  disturbance.  Respiratory:  Negative for cough, shortness of breath and wheezing.   Cardiovascular:  Negative for chest pain, palpitations and leg swelling.  Gastrointestinal:  Negative for abdominal pain, blood in stool, constipation and diarrhea (some loose stools).       No gerd  Genitourinary:  Negative for dysuria.  Musculoskeletal:  Positive for arthralgias (hands). Negative for back pain.  Skin:  Negative for rash.  Neurological:  Negative for light-headedness and headaches.  Psychiatric/Behavioral:  Negative for dysphoric mood. The patient is nervous/anxious.        Objective:   Vitals:   10/29/22 0856  BP: (!) 146/80  Pulse: 60  Temp: 98.2 F (36.8 C)  SpO2: 99%   Filed Weights   10/29/22 0856  Weight: 172 lb (78 kg)   Body mass index is 32.5 kg/m.  BP Readings from Last 3 Encounters:  10/29/22 (!) 146/80  09/09/22 (!) 144/96  04/29/22 126/86    Wt Readings from Last 3 Encounters:  10/29/22 172 lb (78 kg)  09/09/22 169 lb 2 oz (76.7 kg)  07/03/22 168 lb 2 oz (76.3 kg)       Physical Exam Constitutional: She appears well-developed and well-nourished. No distress.  HENT:  Head: Normocephalic and atraumatic.  Right Ear: External ear normal. Normal ear canal and TM Left  Ear: External ear normal.  Normal ear canal and TM Mouth/Throat: Oropharynx is clear and moist.  Eyes: Conjunctivae normal.  Neck: Neck supple. No tracheal deviation present. No thyromegaly present.  No carotid bruit  Cardiovascular: Normal rate, regular rhythm and normal heart sounds.   No murmur heard.  No edema. Pulmonary/Chest: Effort normal and breath sounds normal. No respiratory distress. She has no wheezes. She has no rales.  Breast: deferred   Abdominal: Soft. She exhibits no distension. There is no tenderness.  Lymphadenopathy: She has no cervical adenopathy.  Skin: Skin is warm and dry. She is not diaphoretic.  Psychiatric: She has a normal mood and affect. Her behavior is  normal.   Diabetic Foot Exam - Simple   Simple Foot Form Diabetic Foot exam was performed with the following findings: Yes 10/29/2022  9:31 AM  Visual Inspection Sensation Testing Pulse Check Posterior Tibialis and Dorsalis pulse intact bilaterally: Yes Comments Callus medial ball of foot on left, mild b/l hammertoes 2-3rd, b/l bunions.  Decreased sensation to light touch b/l      Lab Results  Component Value Date   WBC 6.0 10/28/2021   HGB 11.8 (L) 10/28/2021   HCT 34.7 (L) 10/28/2021   PLT 302.0 10/28/2021   GLUCOSE 100 (H) 04/29/2022   CHOL 232 (H) 04/29/2022   TRIG 128.0 04/29/2022   HDL 64.80 04/29/2022   LDLDIRECT 108.0 04/27/2019   LDLCALC 142 (H) 04/29/2022   ALT 12 04/29/2022   AST 17 04/29/2022   NA 138 04/29/2022   K 4.3 04/29/2022   CL 102 04/29/2022   CREATININE 0.89 04/29/2022   BUN 11 04/29/2022   CO2 29 04/29/2022   TSH 1.11 04/29/2021   HGBA1C 6.7 (H) 04/29/2022   MICROALBUR 0.8 04/29/2022         Assessment & Plan:   Physical exam: Screening blood work  ordered Exercise  not regular - yard work [employer]  encouraged weight loss Substance abuse  none   Reviewed recommended immunizations.   Health Maintenance  Topic Date Due   HEMOGLOBIN A1C  10/29/2022   COVID-19 Vaccine (8 - 2023-24 season) 11/13/2022 (Originally 04/10/2022)   INFLUENZA VACCINE  12/17/2022   OPHTHALMOLOGY EXAM  12/23/2022   Diabetic kidney evaluation - eGFR measurement  04/30/2023   Diabetic kidney evaluation - Urine ACR  04/30/2023   Medicare Annual Wellness (AWV)  07/04/2023   FOOT EXAM  10/29/2023   MAMMOGRAM  02/12/2024   DEXA SCAN  07/02/2024   Colonoscopy  08/17/2027   DTaP/Tdap/Td (4 - Td or Tdap) 04/29/2032   Pneumonia Vaccine 66+ Years old  Completed   Hepatitis C Screening  Completed   Zoster Vaccines- Shingrix  Completed   HPV VACCINES  Aged Out          See Problem List for Assessment and Plan of chronic medical problems.

## 2022-10-29 ENCOUNTER — Ambulatory Visit (INDEPENDENT_AMBULATORY_CARE_PROVIDER_SITE_OTHER): Payer: Medicare Other | Admitting: Internal Medicine

## 2022-10-29 VITALS — BP 146/80 | HR 60 | Temp 98.2°F | Ht 61.0 in | Wt 172.0 lb

## 2022-10-29 DIAGNOSIS — E1142 Type 2 diabetes mellitus with diabetic polyneuropathy: Secondary | ICD-10-CM

## 2022-10-29 DIAGNOSIS — E782 Mixed hyperlipidemia: Secondary | ICD-10-CM | POA: Diagnosis not present

## 2022-10-29 DIAGNOSIS — Z7985 Long-term (current) use of injectable non-insulin antidiabetic drugs: Secondary | ICD-10-CM | POA: Diagnosis not present

## 2022-10-29 DIAGNOSIS — E0842 Diabetes mellitus due to underlying condition with diabetic polyneuropathy: Secondary | ICD-10-CM

## 2022-10-29 DIAGNOSIS — M85852 Other specified disorders of bone density and structure, left thigh: Secondary | ICD-10-CM | POA: Diagnosis not present

## 2022-10-29 DIAGNOSIS — Z0000 Encounter for general adult medical examination without abnormal findings: Secondary | ICD-10-CM | POA: Diagnosis not present

## 2022-10-29 DIAGNOSIS — E66811 Obesity, class 1: Secondary | ICD-10-CM

## 2022-10-29 DIAGNOSIS — I700 Atherosclerosis of aorta: Secondary | ICD-10-CM

## 2022-10-29 DIAGNOSIS — E669 Obesity, unspecified: Secondary | ICD-10-CM

## 2022-10-29 DIAGNOSIS — I10 Essential (primary) hypertension: Secondary | ICD-10-CM | POA: Diagnosis not present

## 2022-10-29 DIAGNOSIS — E559 Vitamin D deficiency, unspecified: Secondary | ICD-10-CM

## 2022-10-29 LAB — CBC WITH DIFFERENTIAL/PLATELET
Basophils Absolute: 0 10*3/uL (ref 0.0–0.1)
Basophils Relative: 0.5 % (ref 0.0–3.0)
Eosinophils Absolute: 0.1 10*3/uL (ref 0.0–0.7)
Eosinophils Relative: 2 % (ref 0.0–5.0)
HCT: 34.7 % — ABNORMAL LOW (ref 36.0–46.0)
Hemoglobin: 11.5 g/dL — ABNORMAL LOW (ref 12.0–15.0)
Lymphocytes Relative: 30 % (ref 12.0–46.0)
Lymphs Abs: 1.8 10*3/uL (ref 0.7–4.0)
MCHC: 33.2 g/dL (ref 30.0–36.0)
MCV: 79.4 fl (ref 78.0–100.0)
Monocytes Absolute: 0.5 10*3/uL (ref 0.1–1.0)
Monocytes Relative: 7.8 % (ref 3.0–12.0)
Neutro Abs: 3.5 10*3/uL (ref 1.4–7.7)
Neutrophils Relative %: 59.7 % (ref 43.0–77.0)
Platelets: 309 10*3/uL (ref 150.0–400.0)
RBC: 4.37 Mil/uL (ref 3.87–5.11)
RDW: 14.7 % (ref 11.5–15.5)
WBC: 5.9 10*3/uL (ref 4.0–10.5)

## 2022-10-29 LAB — COMPREHENSIVE METABOLIC PANEL
ALT: 12 U/L (ref 0–35)
AST: 14 U/L (ref 0–37)
Albumin: 4.1 g/dL (ref 3.5–5.2)
Alkaline Phosphatase: 52 U/L (ref 39–117)
BUN: 14 mg/dL (ref 6–23)
CO2: 29 mEq/L (ref 19–32)
Calcium: 9.6 mg/dL (ref 8.4–10.5)
Chloride: 101 mEq/L (ref 96–112)
Creatinine, Ser: 0.82 mg/dL (ref 0.40–1.20)
GFR: 70.64 mL/min (ref >60.00)
Glucose, Bld: 111 mg/dL — ABNORMAL HIGH (ref 70–99)
Potassium: 4.3 mEq/L (ref 3.5–5.1)
Sodium: 137 mEq/L (ref 135–145)
Total Bilirubin: 1 mg/dL (ref 0.2–1.2)
Total Protein: 7 g/dL (ref 6.0–8.3)

## 2022-10-29 LAB — LIPID PANEL
Cholesterol: 220 mg/dL — ABNORMAL HIGH (ref 0–200)
HDL: 55.2 mg/dL (ref >39.00)
LDL Cholesterol: 131 mg/dL — ABNORMAL HIGH (ref 0–99)
NonHDL: 165.27
Total CHOL/HDL Ratio: 4
Triglycerides: 169 mg/dL — ABNORMAL HIGH (ref 0.0–149.0)
VLDL: 33.8 mg/dL (ref 0.0–40.0)

## 2022-10-29 LAB — VITAMIN D 25 HYDROXY (VIT D DEFICIENCY, FRACTURES): VITD: 35.4 ng/mL (ref 30.00–100.00)

## 2022-10-29 LAB — HEMOGLOBIN A1C: Hgb A1c MFr Bld: 6.8 % — ABNORMAL HIGH (ref 4.6–6.5)

## 2022-10-29 NOTE — Assessment & Plan Note (Addendum)
Chronic   Lab Results  Component Value Date   HGBA1C 6.7 (H) 04/29/2022   Sugars controlled Check a1c Encouraged regular exercise ,diabetic diet Has not been on medication for 6 weeks  - has not been able to get trulicity regularly- would like to lifestyle control

## 2022-10-29 NOTE — Assessment & Plan Note (Signed)
Chronic Dexa up to date Mild osteopenia Encouraged regular exercise

## 2022-10-29 NOTE — Assessment & Plan Note (Signed)
Chronic Working on weight loss She is doing yard work and trying to eat healthy and eat less-she will continue her weight loss efforts

## 2022-10-29 NOTE — Assessment & Plan Note (Signed)
Chronic Taking vitamin D daily Check vitamin D level  

## 2022-10-29 NOTE — Assessment & Plan Note (Signed)
Chronic Deferred statin due to liver enlargement on CT Continue zetia 10 mg daily Encouraged healthy diet and regular exercise

## 2022-10-29 NOTE — Assessment & Plan Note (Signed)
Chronic Blood pressure well controlled CMP, cbc Continue hydrochlorothiazide 25 mg daily, losartan 100 mg daily

## 2022-10-29 NOTE — Assessment & Plan Note (Signed)
Chronic Refuses statin secondary to enlarged liver on CT scan Check lipid panel, cmp Continue Zetia 10 mg daily

## 2022-10-29 NOTE — Assessment & Plan Note (Signed)
Chronic Controlled Continue gabapentin 1200 mg at bedtime 

## 2022-11-13 ENCOUNTER — Other Ambulatory Visit: Payer: Self-pay | Admitting: Internal Medicine

## 2022-11-13 DIAGNOSIS — E1142 Type 2 diabetes mellitus with diabetic polyneuropathy: Secondary | ICD-10-CM

## 2022-11-16 ENCOUNTER — Encounter: Payer: Self-pay | Admitting: Internal Medicine

## 2022-11-16 NOTE — Progress Notes (Unsigned)
    Subjective:    Patient ID: Heather [name], female    DOB: 06/02/1950, 72 y.o.   MRN: [id number]      HPI Heather [name] is here for No chief complaint on file.    Back pain, Sciatica -     Medications and allergies reviewed with patient and updated if appropriate.  Current Outpatient Medications on File Prior to Visit  Medication Sig Dispense Refill   Blood Glucose Monitoring Suppl (ONE TOUCH ULTRA 2) w/Device KIT 1 each by Does not apply route 4 (four) times daily. Use device to monitor glucose levels 4 times per day; E11.9 1 kit 0   diclofenac Sodium (VOLTAREN) 1 % GEL APPLY 4 GRAMS TO AFFECTED JOINTS 4 TIMES A DAY (Patient taking differently: Apply 4 g topically daily as needed (pain).) 400 g 11   ezetimibe (ZETIA) 10 MG tablet Take 1 tablet (10 mg total) by mouth daily. 90 tablet 3   famotidine (PEPCID) 10 MG tablet Take 10 mg by mouth daily as needed for heartburn or indigestion.     gabapentin (NEURONTIN) 600 MG tablet TAKE 2 TABLETS (1,200MG  TOTAL) BY MOUTH AT BEDTIME 180 tablet 1   glucose blood (ONETOUCH ULTRA) test strip TEST ONCE DAILY 100 strip 2   hydrochlorothiazide (HYDRODIURIL) 25 MG tablet TAKE 1 TABLET (25 MG TOTAL) BY MOUTH DAILY. 90 tablet 1   ibuprofen (ADVIL) 200 MG tablet Take 400 mg by mouth every 8 (eight) hours as needed for moderate pain.     Lancets (ONETOUCH DELICA PLUS LANCET33G) MISC USE TO TEST BLOOD SUGARS  ONCE DAILY 100 each 3   losartan (COZAAR) 100 MG tablet TAKE 1 TABLET BY MOUTH EVERY DAY 90 tablet 1   ondansetron (ZOFRAN-ODT) 4 MG disintegrating tablet Take 1 tablet (4 mg total) by mouth every 8 (eight) hours as needed for nausea or vomiting. Please schedule an office visit for further refills. 20 tablet 0   No current facility-administered medications on file prior to visit.    Review of Systems     Objective:  There were no vitals filed for this visit. BP Readings from Last 3 Encounters:  10/29/22 (!) 146/80  09/09/22 (!) 144/96   04/29/22 126/86   Wt Readings from Last 3 Encounters:  10/29/22 172 lb (78 kg)  09/09/22 169 lb 2 oz (76.7 kg)  07/03/22 168 lb 2 oz (76.3 kg)   There is no height or weight on file to calculate BMI.    Physical Exam         Assessment & Plan:    See Problem List for Assessment and Plan of chronic medical problems.

## 2022-11-16 NOTE — Patient Instructions (Addendum)
     Have blood work today   Medications changes include :   Paxlovid 3 tabs twice daily for 5 day.  Hold crestor while on paxlovid.   Decrease the amlodipine to 5 mg while taking paxlovid.    Return if symptoms worsen or fail to improve.    

## 2022-11-17 ENCOUNTER — Ambulatory Visit (INDEPENDENT_AMBULATORY_CARE_PROVIDER_SITE_OTHER): Payer: Medicare Other | Admitting: Internal Medicine

## 2022-11-17 ENCOUNTER — Ambulatory Visit (INDEPENDENT_AMBULATORY_CARE_PROVIDER_SITE_OTHER): Payer: Medicare Other

## 2022-11-17 VITALS — BP 148/78 | HR 65 | Temp 98.4°F | Ht 61.0 in

## 2022-11-17 DIAGNOSIS — M5416 Radiculopathy, lumbar region: Secondary | ICD-10-CM

## 2022-11-17 DIAGNOSIS — I10 Essential (primary) hypertension: Secondary | ICD-10-CM | POA: Diagnosis not present

## 2022-11-17 DIAGNOSIS — M5136 Other intervertebral disc degeneration, lumbar region: Secondary | ICD-10-CM | POA: Diagnosis not present

## 2022-11-17 DIAGNOSIS — M5126 Other intervertebral disc displacement, lumbar region: Secondary | ICD-10-CM | POA: Diagnosis not present

## 2022-11-17 DIAGNOSIS — M4310 Spondylolisthesis, site unspecified: Secondary | ICD-10-CM | POA: Diagnosis not present

## 2022-11-17 MED ORDER — INSULIN PEN NEEDLE 32G X 6 MM MISC
5 refills | Status: DC
Start: 2022-11-17 — End: 2022-12-21

## 2022-11-17 MED ORDER — PREDNISONE 20 MG PO TABS: 40.0000 mg | ORAL_TABLET | Freq: Every day | ORAL | 0 refills | Status: AC

## 2022-11-17 MED ORDER — METHOCARBAMOL 500 MG PO TABS: 500.0000 mg | ORAL_TABLET | Freq: Four times a day (QID) | ORAL | 2 refills | Status: DC

## 2022-11-17 MED ORDER — LANTUS SOLOSTAR 100 UNIT/ML ~~LOC~~ SOPN: 10.0000 [IU] | PEN_INJECTOR | Freq: Every day | SUBCUTANEOUS | 3 refills | Status: DC

## 2022-11-17 MED ORDER — HYDROCODONE-ACETAMINOPHEN 5-325 MG PO TABS: 1.0000 | ORAL_TABLET | Freq: Four times a day (QID) | ORAL | 0 refills | Status: DC | PRN

## 2022-11-17 NOTE — Assessment & Plan Note (Addendum)
Acute Had similar symptoms 2 months ago that did resolve Current symptoms started about 3 weeks ago Having severe pain affecting ability to do ADLs Already on gabapentin-increase-she will start taking 100-200 mg in the morning, continue current nighttime dose-can titrate as needed Trial muscle relaxer-methocarbamol 500 mg 4 times daily as needed Prednisone 40 mg daily x 5 days I will give her a small supply of Vicodin to use only for severe pain-she does not want to take this, but we both agree would be good for her to have on hand just in case-Vicodin 5-325 mg take 1-2 tablet every 6 hours as needed for pain-only giving her 15 pills Continue TENS unit Referral to PT If no improvement will refer to orthopedics or sports medicine Avoid bending, lifting and twisting

## 2022-11-17 NOTE — Assessment & Plan Note (Signed)
Chronic Blood pressure currently not controlled which is likely related to her significant pain No change in medications today Continue hydrochlorothiazide 25 mg daily, losartan 100 mg daily

## 2022-11-23 ENCOUNTER — Telehealth: Payer: Self-pay | Admitting: Internal Medicine

## 2022-11-23 DIAGNOSIS — M5416 Radiculopathy, lumbar region: Secondary | ICD-10-CM

## 2022-11-23 NOTE — Telephone Encounter (Signed)
Patient saw Dr. Lawerance Bach on 11/17/2022 for pain. She said Dr. Lawerance Bach said to call if she doesn't get better. Patient said she has completed all the medications Dr. Lawerance Bach prescribed to help except for the pain medication. She wants to use that as little as possible. She wanted to know if Dr. Lawerance Bach wanted to proceed with the MRI they had discussed. Patient also said she received the call about PT, but the can't see her until 12/02/2022. Patient would like a call back at [phone].

## 2022-11-23 NOTE — Telephone Encounter (Signed)
I will order the MRI to see if they will cover it-sometimes PT needs to be done first, but we will see if they will cover

## 2022-11-24 MED ORDER — METHOCARBAMOL 500 MG PO TABS: 500.0000 mg | ORAL_TABLET | Freq: Four times a day (QID) | ORAL | 2 refills | Status: DC

## 2022-11-24 NOTE — Telephone Encounter (Signed)
Spoke with patient today. 

## 2022-11-24 NOTE — Telephone Encounter (Signed)
Reviewed muscle relaxer-okay to continue.

## 2022-11-25 ENCOUNTER — Ambulatory Visit
Admission: RE | Admit: 2022-11-25 | Discharge: 2022-11-25 | Disposition: A | Discharge status: Home / Self Care | Payer: Medicare Other | Source: Ambulatory Visit | Attending: Internal Medicine | Admitting: Internal Medicine

## 2022-11-25 ENCOUNTER — Other Ambulatory Visit: Payer: Medicare Other

## 2022-11-25 DIAGNOSIS — M48061 Spinal stenosis, lumbar region without neurogenic claudication: Secondary | ICD-10-CM | POA: Diagnosis not present

## 2022-11-25 DIAGNOSIS — M47816 Spondylosis without myelopathy or radiculopathy, lumbar region: Secondary | ICD-10-CM | POA: Diagnosis not present

## 2022-11-25 DIAGNOSIS — M5416 Radiculopathy, lumbar region: Secondary | ICD-10-CM

## 2022-11-25 DIAGNOSIS — M4316 Spondylolisthesis, lumbar region: Secondary | ICD-10-CM | POA: Diagnosis not present

## 2022-12-01 ENCOUNTER — Telehealth: Payer: Self-pay | Admitting: Internal Medicine

## 2022-12-01 MED ORDER — TIZANIDINE HCL 4 MG PO TABS: 4.0000 mg | ORAL_TABLET | Freq: Three times a day (TID) | ORAL | 0 refills | Status: DC | PRN

## 2022-12-01 MED ORDER — HYDROCODONE-ACETAMINOPHEN 5-325 MG PO TABS: 1.0000 | ORAL_TABLET | Freq: Four times a day (QID) | ORAL | 0 refills | Status: DC | PRN

## 2022-12-01 NOTE — Telephone Encounter (Signed)
Pt called wanting a call back so someone can go over her MRI results with her pt is concerned. Please advise.

## 2022-12-01 NOTE — Therapy (Signed)
OUTPATIENT PHYSICAL THERAPY THORACOLUMBAR EVALUATION   Patient Name: Heather [name] MRN: [id number] DOB:04-25-1951,72 y.o., female Today's Date: 12/02/2022   END OF SESSION:  PT End of Session - 12/02/22 1059     Visit Number 1    Number of Visits 17    Date for PT Re-Evaluation 01/30/23    Authorization Type [employer] MCR    PT Start Time 1015    PT Stop Time 1058    PT Time Calculation (min) 43 min    Activity Tolerance Patient limited by pain    Behavior During Therapy [facility] for tasks assessed/performed              Past Medical History:  Diagnosis Date   Arthritis    Colon polyps    Diabetes mellitus without complication (HCC)    Environmental allergies    GERD (gastroesophageal reflux disease)    Heart murmur    since birth never a problem per pt   History of DVT (deep vein thrombosis) 01/16/1969   History of hiatal hernia    History of transfusion    Hyperlipidemia    Hypertension    Interstitial cystitis    Dr Brunilda Payor   PONV (postoperative nausea and vomiting)    Past Surgical History:  Procedure Laterality Date   APPENDECTOMY     BILATERAL SALPINGOOPHORECTOMY     painful cysts   CATARACT EXTRACTION, BILATERAL     COLONOSCOPY  2005   negative; Dr Kinnie Scales. F/U declined   CYSTOSCOPY     X 4; Dr Brunilda Payor   INSERTION OF MESH N/A 09/17/2021   Procedure: INSERTION OF MESH;  Surgeon: Karie Soda, MD;  Location: [facility];  Service: General;  Laterality: N/A;   LAPAROSCOPIC RIGHT COLECTOMY Right 08/19/2015   Procedure: LAPAROSCOPIC ASSISTED  RIGHT COLECTOMY;  Surgeon: Ovidio Kin, MD;  Location: [facility];  Service: General;  Laterality: Right;   LYSIS OF ADHESION N/A 09/17/2021   Procedure: LYSIS OF ADHESION;  Surgeon: Karie Soda, MD;  Location: [facility];  Service: General;  Laterality: N/A;   PREMAGLINANT POLYPS     TOTAL ABDOMINAL HYSTERECTOMY     metromenorrhagia   VENTRAL HERNIA REPAIR N/A 09/17/2021   Procedure: LAPAROSCOPIC VENTRAL WALL HERNIA REPAIR BILATERAL TAP  BLOCK;  Surgeon: Karie Soda, MD;  Location: [facility];  Service: General;  Laterality: N/A;   Patient Active Problem List   Diagnosis Date Noted   Incarcerated incisional hernia s/p lap repair w mesh 09/17/2021 09/17/2021   Osteopenia 07/05/2021   Lumbar back pain with radiculopathy affecting right lower extremity 03/03/2021   Fall 03/03/2021   Aortic atherosclerosis (HCC) 10/25/2020   GERD (gastroesophageal reflux disease) 04/26/2020   Vitamin D deficiency 04/25/2020   Arthralgia 10/26/2019   Dry cough 11/22/2017   Nocturnal leg cramps 02/02/2017   DOE (dyspnea on exertion) 08/04/2016   Obesity (BMI 30.0-34.9) 08/04/2016   Generalized osteoarthritis of hand 02/04/2016   Diabetic polyneuropathy associated with diabetes mellitus due to underlying condition (HCC) 11/27/2015   Hiatal hernia, large 08/02/2015   Colon polyps 08/02/2015   Iron deficiency 04/21/2015   Diabetes type 2, controlled (HCC) 05/03/2014   Hyperlipidemia, mixed 03/18/2014   Interstitial cystitis 07/08/2012   Essential hypertension 02/10/2011    PCP: Pincus Sanes, MD   REFERRING PROVIDER: Pincus Sanes, MD   REFERRING DIAG: M54.16 (ICD-10-CM) - Lumbar back pain with radiculopathy affecting right lower extremity   Rationale for Evaluation and Treatment: Rehabilitation  THERAPY DIAG:  Radiculopathy, lumbar region  Muscle weakness (generalized)  Difficulty in walking, not elsewhere classified  ONSET DATE: acute on chronic   SUBJECTIVE:                                                                                                                                                                                           SUBJECTIVE STATEMENT: Patient reports pain on/off for the past 2 years in low back that courses down the RLE. She reports the pain has been non-stop since April that is progressively worsened. She reports the pain began 2 years ago in June 2022 when she fell onto her back when she putting  curtains up and missed a step on the ladder. Her pain was worsened after a road trip in August 2022. The TENS unit helped with her pain at this time, but is no longer helping since her recent worsening of pain this year. The pain courses along the Rt posterior hip to lateral/anterior thigh and stops at the knee. She reports numbness in bilateral feet that has been ongoing for years that she attributes to diabetic neuropathy. She has recently undergone an MRI with results pending. No saddle paraesthesia. She reports new onset of bowel/bladder urge incontinence that has been happening intermittently over the past 3 weeks.   PERTINENT HISTORY:  Diabetes   PAIN:  Are you having pain? Yes: NPRS scale: 7/10 Pain location: Rt posterior hip Pain description: "it just hurts" Aggravating factors: standing/walking Relieving factors: sitting  PRECAUTIONS: Fall  WEIGHT BEARING RESTRICTIONS: No  FALLS:  Has patient fallen in last 6 months? No  LIVING ENVIRONMENT: Lives with: lives with their family Lives in: House/apartment Stairs: Yes: External: 2 steps; none Has following equipment at home: Single point cane and Walker - 4 wheeled  OCCUPATION: retired   PLOF: Independent  PATIENT GOALS: "I don't know and until the MRI gets back I want to know how safe it is to be moving."    OBJECTIVE:   DIAGNOSTIC FINDINGS:  MRI pending   PATIENT SURVEYS:  FOTO 34% function to 49% predicted   SCREENING FOR RED FLAGS: Bowel or bladder incontinence: Yes: see subjective     COGNITION: Overall cognitive status: Within functional limits for tasks assessed     SENSATION: WFL    POSTURE: forward flexed posture   PALPATION: Not assessed   LUMBAR ROM:   AROM eval  Flexion   Extension   Right lateral flexion   Left lateral flexion   Right rotation   Left rotation    (Blank rows = not tested) unable to assess due to patient not tolerating standing   LOWER EXTREMITY ROM:  Active   Right eval Left eval  Hip flexion    Hip extension    Hip abduction    Hip adduction    Hip internal rotation    Hip external rotation    Knee flexion    Knee extension    Ankle dorsiflexion    Ankle plantarflexion    Ankle inversion    Ankle eversion     (Blank rows = not tested) hip ROM not assessed as patient unable to lie supine due to pain   LOWER EXTREMITY MMT:    MMT Right eval Left eval  Hip flexion 3- 4  Hip extension    Hip abduction    Hip adduction    Hip internal rotation    Hip external rotation    Knee flexion 4 5  Knee extension 4 5  Ankle dorsiflexion 4+ 5  Ankle plantarflexion 4+ 5  Ankle inversion    Ankle eversion 4+ 5   (Blank rows = not tested); patient unable to lie supine or sidelying due to pain   LUMBAR SPECIAL TESTS:  Unable to assess SLR due to inability to lie supine   FUNCTIONAL TESTS:  Unable to assess due to symptoms irritability   GAIT: Distance walked: 10 ft Assistive device utilized: Single point cane Level of assistance: Modified independence Comments: step to pattern, forward flexed posture, slow gait speed   [facility] Adult PT Treatment:                                                DATE: 12/02/22   Self Care: Educated on bowel/bladder incontinence red flag symptom that warrants urgent medical attention with recommendation to go to the ED for further assessment     PATIENT EDUCATION:  Education details: see treatment Person educated: Patient Education method: Explanation Education comprehension: verbalized understanding  HOME EXERCISE PROGRAM: Access Code: [email or web address] URL: [email or web address] Date: 12/02/2022 Prepared by: Letitia Libra  Exercises - Seated Scapular Retraction  - 1 x daily - 7 x weekly - 2 sets - 10 reps - Seated Elbow Flexion with Self-Anchored Resistance  - 1 x daily - 7 x weekly - 2 sets - 10 reps - Standing Elbow Extension with Self-Anchored Resistance  - 1 x daily - 7 x weekly - 2  sets - 10 reps - Putty Squeezes  - 1 x daily - 7 x weekly - 2 sets - 10 reps - Wrist Extension with Resistance  - 1 x daily - 7 x weekly - 2 sets - 10 reps - Wrist Flexion with Resistance  - 1 x daily - 7 x weekly - 2 sets - 10 reps  ASSESSMENT:  CLINICAL IMPRESSION: Patient is a 72 y.o. female who was seen today for physical therapy evaluation for chronic low back/RLE pain with an exacerbation in April 2024 without known cause. She reports her pain has progressively worsened since April and is having extreme difficulty with standing and walking secondary to her pain and weakness. She also reports new onset of bowel/bladder incontinence that began about 3 weeks ago. Due to this symptom she was urged to seek urgent care with recommendation to go to the ED for further assessment with patient verbalizing understanding. Once cleared for PT by referring provider or ED provider she will benefit from skilled PT to address her strength, mobility, gait, and postural deficits in  order to improve her functional mobility and assist in overall pain reduction. PT is on hold until further assessment and clearance by medical provider given her new onset of incontinence.   OBJECTIVE IMPAIRMENTS: Abnormal gait, decreased activity tolerance, decreased balance, decreased mobility, difficulty walking, decreased ROM, decreased strength, improper body mechanics, postural dysfunction, and pain.   ACTIVITY LIMITATIONS: carrying, lifting, bending, standing, squatting, stairs, transfers, bathing, dressing, reach over head, hygiene/grooming, and locomotion level  PARTICIPATION LIMITATIONS: meal prep, cleaning, laundry, shopping, community activity, and yard work  PERSONAL FACTORS: Age, Fitness, Time since onset of injury/illness/exacerbation, and 3+ comorbidities: see PMH above  are also affecting patient's functional outcome.   REHAB POTENTIAL: Fair see personal factors  CLINICAL DECISION MAKING: Evolving/moderate  complexity  EVALUATION COMPLEXITY: Moderate   GOALS: Goals reviewed with patient? Yes  SHORT TERM GOALS: Target date: 12/30/2022    Patient will be independent and compliant with initial HEP.   Baseline: not issued  Goal status: INITIAL  2.  Patient will demonstrate step through gait pattern to improve walking efficiency.  Baseline: see above  Goal status: INITIAL  3.  Patient will tolerate supine and sidelying positioning to improve sleep quality.  Baseline: unable Goal status: INITIAL    LONG TERM GOALS: Target date: 01/30/2023    Patient will tolerate at least 10 minutes of standing activity to improve ability to complete household activities.  Baseline: see above Goal status: INITIAL  2.  Patient will demonstrate 5/5 Rt knee strength to improve stability with stair/curb negotiation.  Baseline: see above Goal status: INITIAL  3.  Patient will report pain at worst rated as </=4/10 to reduce her current functional limitations.  Baseline: see above Goal status: INITIAL  4.  Patient will score at least 49% on FOTO to signify clinically meaningful improvement in functional abilities.   Baseline: see above Goal status: INITIAL  PLAN:  PT FREQUENCY: 1-2x/week  PT DURATION: 8 weeks  PLANNED INTERVENTIONS: Therapeutic exercises, Therapeutic activity, Neuromuscular re-education, Balance training, Gait training, Patient/Family education, Self Care, Joint mobilization, Electrical stimulation, Cryotherapy, Moist heat, Manual therapy, and Re-evaluation.  PLAN FOR NEXT SESSION: issued initial HEP to include gentle trunk mobility, core/hip strengthening; clearance required to begin PT treatment.   Letitia Libra, PT, DPT, ATC 12/02/22 12:54 PM

## 2022-12-02 ENCOUNTER — Ambulatory Visit: Payer: Medicare Other | Attending: Internal Medicine

## 2022-12-02 ENCOUNTER — Other Ambulatory Visit: Payer: Self-pay

## 2022-12-02 DIAGNOSIS — M5416 Radiculopathy, lumbar region: Secondary | ICD-10-CM | POA: Diagnosis not present

## 2022-12-02 DIAGNOSIS — M6281 Muscle weakness (generalized): Secondary | ICD-10-CM | POA: Diagnosis not present

## 2022-12-02 DIAGNOSIS — R262 Difficulty in walking, not elsewhere classified: Secondary | ICD-10-CM | POA: Diagnosis not present

## 2022-12-09 ENCOUNTER — Encounter: Payer: Self-pay | Admitting: Internal Medicine

## 2022-12-09 DIAGNOSIS — M5416 Radiculopathy, lumbar region: Secondary | ICD-10-CM

## 2022-12-19 ENCOUNTER — Other Ambulatory Visit: Payer: Self-pay | Admitting: Internal Medicine

## 2022-12-21 ENCOUNTER — Other Ambulatory Visit: Payer: Self-pay | Admitting: Internal Medicine

## 2022-12-21 ENCOUNTER — Telehealth: Payer: Self-pay | Admitting: Internal Medicine

## 2022-12-21 MED ORDER — TRULICITY 0.75 MG/0.5ML ~~LOC~~ SOAJ: 0.7500 mg | SUBCUTANEOUS | 0 refills | Status: DC

## 2022-12-21 NOTE — Telephone Encounter (Signed)
Pt called wanting to get off insuline and wanted to get back on Trulicity because that works very will with her diabetes. Pt also stated the the pharmacy called letting her know that the medication was back in and she needed to call her Doctors. Please advise.

## 2022-12-24 DIAGNOSIS — H5212 Myopia, left eye: Secondary | ICD-10-CM | POA: Diagnosis not present

## 2022-12-24 DIAGNOSIS — H52221 Regular astigmatism, right eye: Secondary | ICD-10-CM | POA: Diagnosis not present

## 2022-12-24 DIAGNOSIS — H26493 Other secondary cataract, bilateral: Secondary | ICD-10-CM | POA: Diagnosis not present

## 2022-12-24 DIAGNOSIS — B880 Other acariasis: Secondary | ICD-10-CM | POA: Diagnosis not present

## 2022-12-24 DIAGNOSIS — H35363 Drusen (degenerative) of macula, bilateral: Secondary | ICD-10-CM | POA: Diagnosis not present

## 2022-12-24 DIAGNOSIS — E119 Type 2 diabetes mellitus without complications: Secondary | ICD-10-CM | POA: Diagnosis not present

## 2022-12-24 LAB — HM DIABETES EYE EXAM

## 2022-12-29 ENCOUNTER — Encounter: Payer: Self-pay | Admitting: Internal Medicine

## 2022-12-29 MED ORDER — TIZANIDINE HCL 4 MG PO TABS: 4.0000 mg | ORAL_TABLET | Freq: Three times a day (TID) | ORAL | 0 refills | Status: DC | PRN

## 2022-12-30 ENCOUNTER — Other Ambulatory Visit: Payer: Self-pay | Admitting: Internal Medicine

## 2023-01-06 ENCOUNTER — Encounter: Payer: Self-pay | Admitting: Orthopedic Surgery

## 2023-01-06 ENCOUNTER — Other Ambulatory Visit: Payer: Self-pay

## 2023-01-06 ENCOUNTER — Ambulatory Visit (INDEPENDENT_AMBULATORY_CARE_PROVIDER_SITE_OTHER): Payer: Medicare Other | Admitting: Orthopedic Surgery

## 2023-01-06 ENCOUNTER — Other Ambulatory Visit: Payer: Self-pay | Admitting: Internal Medicine

## 2023-01-06 VITALS — BP 158/84 | HR 65 | Ht 61.0 in | Wt 172.0 lb

## 2023-01-06 DIAGNOSIS — M5416 Radiculopathy, lumbar region: Secondary | ICD-10-CM

## 2023-01-06 DIAGNOSIS — G8929 Other chronic pain: Secondary | ICD-10-CM

## 2023-01-06 DIAGNOSIS — M545 Low back pain, unspecified: Secondary | ICD-10-CM | POA: Diagnosis not present

## 2023-01-06 NOTE — Progress Notes (Signed)
Orthopedic Spine Surgery Office Note  Assessment: Patient is a 72 y.o. female with low back pain that radiates into the right lower extremity along the lateral and anterior aspects.  Has a spondylolisthesis with lateral recess stenosis at L4/5   Plan: -Explained that initially conservative treatment is tried as a significant number of patients may experience relief with these treatment modalities. Discussed that the conservative treatments include:  -activity modification  -physical therapy  -over the counter pain medications  -medrol dosepak  -lumbar steroid injections -Patient has tried  tens unit, Tylenol, ibuprofen, oral steroids  -Recommended diagnostic/therapeutic injection to L4/5.  Referral provided to Dr. [place] Blas office -Patient should return to office in 4 weeks, x-rays at next visit: None   Patient expressed understanding of the plan and all questions were answered to the patient's satisfaction.   ___________________________________________________________________________   History:  Patient is a 72 y.o. female who presents today for lumbar spine.  Patient has low back pain that radiates into the right lower extremity.  She feels it along the lateral and anterior aspect of the right leg.  It does not radiate past the knee.  She has had this pain for 2 years and has gotten progressively worse with time.  She now feels the pain on a daily basis.  She notes the pain with activity and at rest.  She states that the pain started after a fall from a height when she was putting up some curtains.  She does not have any pain radiating to the left lower extremity.   Weakness: Denies Symptoms of imbalance: Yes, has had issues with balance since she is insensate in her bilateral feet from diabetic neuropathy.  No recent changes Paresthesias and numbness: Yes, decreased sensation distal to the knee bilaterally from neuropathy.  Is numb distal to the ankles bilaterally.  No recent changes  in her numbness or paresthesias. Bowel or bladder incontinence: Has functional incontinence but no other urinary incontinence.  No bowel incontinence. Saddle anesthesia: Denies  Treatments tried: tens unit, Tylenol, ibuprofen, oral steroids  Review of systems: Denies fevers and chills, night sweats, unexplained weight loss, history of cancer.  Has had pain that wakes her at night  Past medical history: DM with neuropathy (last A1c was 6.8 on 10/29/2022) GERD Interstitial cystitis HLD HTN History of DVT   Allergies: Tramadol, Farxiga, Flagyl, Jardiance, lisinopril, cholestyramine, metformin, Prandin, sulfonamide derivatives  Past surgical history:  Hysterectomy Partial colectomy Cataract surgery bilaterally Appendectomy Lysis of adhesions Ventral hernia repair  Social history: Denies use of nicotine product (smoking, vaping, patches, smokeless) Alcohol use: Denies Denies recreational drug use   Physical Exam:  BMI of 32.5  General: no acute distress, appears stated age Neurologic: alert, answering questions appropriately, following commands Respiratory: unlabored breathing on room air, symmetric chest rise Psychiatric: appropriate affect, normal cadence to speech   MSK (spine):  -Strength exam      Left  Right EHL    5/5  5/5 TA    5/5  5/5 GSC    5/5  5/5 Knee extension  5/5  5/5 Hip flexion   5/5  5/5  -Sensory exam    Sensation intact to light touch in L3 distributions bilaterally. Decreased sensation from the knee to the midshaft tibia bilaterally. Numb in bilateral feet/ankles.   -Achilles DTR: 1/4 on the left, 1/4 on the right -Patellar tendon DTR: 1/4 on the left, 1/4 on the right  -Straight leg raise: negative bilaterally -Femoral nerve stretch test: negative bilaterally  -  Clonus: no beats bilaterally  -Left hip exam: no pain through range of motion, negative stinchfield, negative faber -Right hip exam: no pain through range of motion, negative  stinchfield, negative faber  Imaging: XR of the lumbar spine from 01/06/2023 was independently reviewed and interpreted, showing disc height loss at L5/S1.  Spondylolisthesis at L4/5 that shifts about 1 mm between flexion and extension views.  No fracture or dislocation seen.  MRI of the lumbar spine from 11/25/2022 was independently reviewed and interpreted, showing bilateral lateral recess stenosis with foraminal stenosis at L4/5.  There is a spondylolisthesis at L4/5.  Central stenosis at L2/3.  No other significant stenosis seen.  DEXA scan in 2023 did not show any evidence of osteoporosis  Patient name: Heather [name] Patient MRN: [id number] Date of visit: 01/06/23

## 2023-01-07 MED ORDER — TRULICITY 0.75 MG/0.5ML ~~LOC~~ SOAJ: 0.7500 mg | SUBCUTANEOUS | 0 refills | Status: DC

## 2023-01-12 ENCOUNTER — Other Ambulatory Visit: Payer: Self-pay | Admitting: Internal Medicine

## 2023-01-14 ENCOUNTER — Telehealth: Payer: Self-pay | Admitting: Physical Medicine and Rehabilitation

## 2023-01-14 NOTE — Telephone Encounter (Signed)
IC scheduled-will bring driver

## 2023-01-14 NOTE — Telephone Encounter (Signed)
Pt called requesting a call to set an appt from referral. Please call pt at [phone].

## 2023-01-21 ENCOUNTER — Other Ambulatory Visit: Payer: Self-pay

## 2023-01-21 ENCOUNTER — Ambulatory Visit: Payer: Medicare Other | Admitting: Physical Medicine and Rehabilitation

## 2023-01-21 VITALS — BP 175/83 | HR 67

## 2023-01-21 DIAGNOSIS — M5416 Radiculopathy, lumbar region: Secondary | ICD-10-CM

## 2023-01-21 MED ORDER — METHYLPREDNISOLONE ACETATE 80 MG/ML IJ SUSP
80.0000 mg | Freq: Once | INTRAMUSCULAR | Status: AC
  Administered 2023-01-21: 80 mg

## 2023-01-21 NOTE — Progress Notes (Signed)
Functional Pain Scale - descriptive words and definitions  Distracting (5)    Aware of pain/able to complete some ADL's but limited by pain/sleep is affected and active distractions are only slightly useful. Moderate range order  Average Pain 5, but can vary from day to day   +Driver, -BT, -Dye Allergies.  Lower back pain on right side

## 2023-01-21 NOTE — Patient Instructions (Signed)

## 2023-01-26 ENCOUNTER — Other Ambulatory Visit: Payer: Self-pay | Admitting: Internal Medicine

## 2023-01-26 DIAGNOSIS — H26493 Other secondary cataract, bilateral: Secondary | ICD-10-CM | POA: Diagnosis not present

## 2023-01-26 DIAGNOSIS — H26491 Other secondary cataract, right eye: Secondary | ICD-10-CM | POA: Diagnosis not present

## 2023-01-27 NOTE — Procedures (Signed)
Lumbosacral Transforaminal Epidural Steroid Injection - Sub-Pedicular Approach with Fluoroscopic Guidance  Patient: Heather [name]      Date of Birth: March 27, 1951 MRN: [id number] PCP: Pincus Sanes, MD      Visit Date: 01/21/2023   Universal Protocol:    Date/Time: 01/21/2023  Consent Given By: the patient  Position: PRONE  Additional Comments: Vital signs were monitored before and after the procedure. Patient was prepped and draped in the usual sterile fashion. The correct patient, procedure, and site was verified.   Injection Procedure Details:   Procedure diagnoses: Lumbar radiculopathy [M54.16]    Meds Administered:  Meds ordered this encounter  Medications   methylPREDNISolone acetate (DEPO-MEDROL) injection 80 mg    Laterality: Right  Location/Site: L4  Needle:5.0 in., 22 ga.  Short bevel or Quincke spinal needle  Needle Placement: Transforaminal  Findings:    -Comments: Excellent flow of contrast along the nerve, nerve root and into the epidural space.  Procedure Details: After squaring off the end-plates to get a true AP view, the C-arm was positioned so that an oblique view of the foramen as noted above was visualized. The target area is just inferior to the "nose of the scotty dog" or sub pedicular. The soft tissues overlying this structure were infiltrated with 2-3 ml. of 1% Lidocaine without Epinephrine.  The spinal needle was inserted toward the target using a "trajectory" view along the fluoroscope beam.  Under AP and lateral visualization, the needle was advanced so it did not puncture dura and was located close the 6 O'Clock position of the pedical in AP tracterory. Biplanar projections were used to confirm position. Aspiration was confirmed to be negative for CSF and/or blood. A 1-2 ml. volume of Isovue-250 was injected and flow of contrast was noted at each level. Radiographs were obtained for documentation purposes.   After attaining the desired flow  of contrast documented above, a 0.5 to 1.0 ml test dose of 0.25% Marcaine was injected into each respective transforaminal space.  The patient was observed for 90 seconds post injection.  After no sensory deficits were reported, and normal lower extremity motor function was noted,   the above injectate was administered so that equal amounts of the injectate were placed at each foramen (level) into the transforaminal epidural space.   Additional Comments:  No complications occurred Dressing: 2 x 2 sterile gauze and Band-Aid    Post-procedure details: Patient was observed during the procedure. Post-procedure instructions were reviewed.  Patient left the clinic in stable condition.

## 2023-01-27 NOTE — Progress Notes (Signed)
Heather [name] - 72 y.o. female MRN [id number]  Date of birth: November 02, 1950  Office Visit Note: Visit Date: 01/21/2023 PCP: Pincus Sanes, MD Referred by: London Sheer, MD  Subjective: Chief Complaint  Patient presents with   Lower Back - Pain   HPI:  Heather [name] is a 72 y.o. female who comes in today at the request of Dr. Willia Craze for planned Right L4-5 Lumbar Transforaminal epidural steroid injection with fluoroscopic guidance.  The patient has failed conservative care including home exercise, medications, time and activity modification.  This injection will be diagnostic and hopefully therapeutic.  Please see requesting physician notes for further details and justification.   ROS Otherwise per HPI.  Assessment & Plan: Visit Diagnoses:    ICD-10-CM   1. Lumbar radiculopathy  M54.16 XR C-ARM NO REPORT    Epidural Steroid injection    methylPREDNISolone acetate (DEPO-MEDROL) injection 80 mg      Plan: No additional findings.   Meds & Orders:  Meds ordered this encounter  Medications   methylPREDNISolone acetate (DEPO-MEDROL) injection 80 mg    Orders Placed This Encounter  Procedures   XR C-ARM NO REPORT   Epidural Steroid injection    Follow-up: Return for visit to requesting provider as needed.   Procedures: No procedures performed  Lumbosacral Transforaminal Epidural Steroid Injection - Sub-Pedicular Approach with Fluoroscopic Guidance  Patient: Heather [name]      Date of Birth: 08-Jun-1950 MRN: [id number] PCP: Pincus Sanes, MD      Visit Date: 01/21/2023   Universal Protocol:    Date/Time: 01/21/2023  Consent Given By: the patient  Position: PRONE  Additional Comments: Vital signs were monitored before and after the procedure. Patient was prepped and draped in the usual sterile fashion. The correct patient, procedure, and site was verified.   Injection Procedure Details:   Procedure diagnoses: Lumbar radiculopathy [M54.16]     Meds Administered:  Meds ordered this encounter  Medications   methylPREDNISolone acetate (DEPO-MEDROL) injection 80 mg    Laterality: Right  Location/Site: L4  Needle:5.0 in., 22 ga.  Short bevel or Quincke spinal needle  Needle Placement: Transforaminal  Findings:    -Comments: Excellent flow of contrast along the nerve, nerve root and into the epidural space.  Procedure Details: After squaring off the end-plates to get a true AP view, the C-arm was positioned so that an oblique view of the foramen as noted above was visualized. The target area is just inferior to the "nose of the scotty dog" or sub pedicular. The soft tissues overlying this structure were infiltrated with 2-3 ml. of 1% Lidocaine without Epinephrine.  The spinal needle was inserted toward the target using a "trajectory" view along the fluoroscope beam.  Under AP and lateral visualization, the needle was advanced so it did not puncture dura and was located close the 6 O'Clock position of the pedical in AP tracterory. Biplanar projections were used to confirm position. Aspiration was confirmed to be negative for CSF and/or blood. A 1-2 ml. volume of Isovue-250 was injected and flow of contrast was noted at each level. Radiographs were obtained for documentation purposes.   After attaining the desired flow of contrast documented above, a 0.5 to 1.0 ml test dose of 0.25% Marcaine was injected into each respective transforaminal space.  The patient was observed for 90 seconds post injection.  After no sensory deficits were reported, and normal lower extremity motor function was noted,   the above injectate  was administered so that equal amounts of the injectate were placed at each foramen (level) into the transforaminal epidural space.   Additional Comments:  No complications occurred Dressing: 2 x 2 sterile gauze and Band-Aid    Post-procedure details: Patient was observed during the procedure. Post-procedure  instructions were reviewed.  Patient left the clinic in stable condition.    Clinical History: MRI LUMBAR SPINE WITHOUT CONTRAST   TECHNIQUE: Multiplanar, multisequence MR imaging of the lumbar spine was performed. No intravenous contrast was administered.   COMPARISON:  X-ray 11/17/2022   FINDINGS: Segmentation:  Standard.   Alignment: Grade 1-2 anterolisthesis of L4 on L5. Mild retrolisthesis at T12-L1 and L1-L2.   Vertebrae:  No fracture, evidence of discitis, or bone lesion.   Conus medullaris and cauda equina: Conus extends to the L1 level. Conus and cauda equina appear normal.   Paraspinal and other soft tissues: Negative.   Disc levels:   T11-T12: Shallow central disc protrusion without canal or foraminal stenosis.   T12-L1: Mild disc bulge and endplate spurring. No significant canal stenosis. Mild left foraminal stenosis.   L1-L2: Disc bulge and endplate spurring. Mild facet hypertrophy. No canal stenosis. Mild bilateral foraminal stenosis.   L2-L3: Broad-based disc bulge with bilateral facet arthropathy and ligamentum flavum buckling. Mild canal stenosis. No significant foraminal stenosis.   L3-L4: No significant disc bulge. Mild-to-moderate bilateral facet arthropathy. No significant canal or foraminal stenosis.   L4-L5: Anterolisthesis with disc uncovering. Severe bilateral facet arthropathy. Bilateral subarticular recess stenosis with mild canal stenosis. No significant foraminal stenosis.   L5-S1: Mild endplate spurring and bilateral facet arthropathy. No foraminal or canal stenosis.   IMPRESSION: 1. Multilevel lumbar spondylosis, most pronounced at the L4-5 level where there is grade 1-2 anterolisthesis secondary to severe bilateral facet arthropathy. Bilateral subarticular recess stenosis with mild canal stenosis at this level. 2. Mild canal stenosis at L2-L3. 3. Mild foraminal stenosis on the left at T12-L1 and bilaterally at L1-L2.      Electronically Signed   By: Duanne Guess D.O.   On: 12/03/2022 15:28     Objective:  VS:  HT:    WT:   BMI:     BP:(!) 175/83  HR:67bpm  TEMP: ( )  RESP:  Physical Exam Vitals and nursing note reviewed.  Constitutional:      General: She is not in acute distress.    Appearance: Normal appearance. She is not ill-appearing.  HENT:     Head: Normocephalic and atraumatic.     Right Ear: External ear normal.     Left Ear: External ear normal.  Eyes:     Extraocular Movements: Extraocular movements intact.  Cardiovascular:     Rate and Rhythm: Normal rate.     Pulses: Normal pulses.  Pulmonary:     Effort: Pulmonary effort is normal. No respiratory distress.  Abdominal:     General: There is no distension.     Palpations: Abdomen is soft.  Musculoskeletal:        General: Tenderness present.     Cervical back: Neck supple.     Right lower leg: No edema.     Left lower leg: No edema.     Comments: Patient has good distal strength with no pain over the greater trochanters.  No clonus or focal weakness.  Skin:    Findings: No erythema, lesion or rash.  Neurological:     General: No focal deficit present.     Mental Status: She is alert  and oriented to person, place, and time.     Sensory: No sensory deficit.     Motor: No weakness or abnormal muscle tone.     Coordination: Coordination normal.  Psychiatric:        Mood and Affect: Mood normal.        Behavior: Behavior normal.      Imaging: No results found.

## 2023-01-28 DIAGNOSIS — H26491 Other secondary cataract, right eye: Secondary | ICD-10-CM | POA: Diagnosis not present

## 2023-02-03 ENCOUNTER — Ambulatory Visit: Payer: Medicare Other | Admitting: Orthopedic Surgery

## 2023-02-03 DIAGNOSIS — M5416 Radiculopathy, lumbar region: Secondary | ICD-10-CM | POA: Diagnosis not present

## 2023-02-03 MED ORDER — TIZANIDINE HCL 4 MG PO TABS: 4.0000 mg | ORAL_TABLET | Freq: Three times a day (TID) | ORAL | 2 refills | Status: DC | PRN

## 2023-02-03 NOTE — Progress Notes (Signed)
Orthopedic Spine Surgery Office Note   Assessment: Patient is a 72 y.o. female with low back pain that radiates into the right lower extremity along the lateral and anterior aspects.  Has a spondylolisthesis with lateral recess stenosis at L4/5. Injection helped resolve her leg pain     Plan: -Patient has tried  tens unit, Tylenol, ibuprofen, oral steroids, lumbar steroid injection -Since she got 100% relief of her leg pain with the injection, I told her if that as long as it lasts, she would be a candidate for repeat injection in the future -She has gotten relief with tizanidine for her back pain, so prescribed that to her today -Explained that injections and surgery will more predictably relieve the leg pain but not the back pain -Patient should return to office on an as-needed basis     Patient expressed understanding of the plan and all questions were answered to the patient's satisfaction.    ___________________________________________________________________________     History:   Patient is a 72 y.o. female who presents today for follow-up on her lumbar spine.  After last visit, patient got a lumbar injection with Dr. Alvester Morin.  She states that she got significant relief of her right lower extremity pain.  She is still having right-sided low back pain.  She states it is more tolerable but it is still present and significant.  She notes that it is better if she rests periodically.  She says it is worse if she is standing for a while like when she is making dinner.  No pain rating into the left lower extremity.  Has decreased sensation distal to the knees bilaterally from neuropathy.  No recent changes in numbness or paresthesias.  Still has functional incontinence but no other urinary incontinence.  No changes in her bowel or bladder habits.  No saddle anesthesia.   Treatments tried: tens unit, Tylenol, ibuprofen, oral steroids, lumbar steroid injection     Physical Exam:   General: no  acute distress, appears stated age Neurologic: alert, answering questions appropriately, following commands Respiratory: unlabored breathing on room air, symmetric chest rise Psychiatric: appropriate affect, normal cadence to speech     MSK (spine):   -Strength exam                                                   Left                  Right EHL                              5/5                  5/5 TA                                 5/5                  5/5 GSC                             5/5                  5/5 Knee extension  5/5                  5/5 Hip flexion                    5/5                  5/5   -Sensory exam                           Sensation intact to light touch in L3 distributions bilaterally. Decreased sensation from the knee to the midshaft tibia bilaterally. Numb in bilateral feet/ankles.    -Achilles DTR: 1/4 on the left, 1/4 on the right -Patellar tendon DTR: 1/4 on the left, 1/4 on the right   Imaging: XR of the lumbar spine from 01/06/2023 was previously independently reviewed and interpreted, showing disc height loss at L5/S1.  Spondylolisthesis at L4/5 that shifts about 1 mm between flexion and extension views.  No fracture or dislocation seen.   MRI of the lumbar spine from 11/25/2022 was previously independently reviewed and interpreted, showing bilateral lateral recess stenosis with foraminal stenosis at L4/5.  There is a spondylolisthesis at L4/5.  Central stenosis at L2/3.  No other significant stenosis seen.   DEXA scan in 2023 did not show any evidence of osteoporosis   Patient name: Heather [name] Patient MRN: [id number] Date of visit: 02/03/23

## 2023-02-24 ENCOUNTER — Other Ambulatory Visit: Payer: Self-pay | Admitting: Internal Medicine

## 2023-02-25 DIAGNOSIS — Z1231 Encounter for screening mammogram for malignant neoplasm of breast: Secondary | ICD-10-CM | POA: Diagnosis not present

## 2023-02-25 LAB — HM MAMMOGRAPHY

## 2023-02-26 ENCOUNTER — Encounter: Payer: Self-pay | Admitting: Internal Medicine

## 2023-03-08 ENCOUNTER — Encounter: Payer: Self-pay | Admitting: Orthopedic Surgery

## 2023-03-08 MED ORDER — HYDROCODONE-ACETAMINOPHEN 5-325 MG PO TABS: 1.0000 | ORAL_TABLET | ORAL | 0 refills | Status: AC | PRN

## 2023-03-24 ENCOUNTER — Ambulatory Visit: Payer: Medicare Other | Admitting: Orthopedic Surgery

## 2023-03-24 DIAGNOSIS — M5441 Lumbago with sciatica, right side: Secondary | ICD-10-CM

## 2023-03-24 DIAGNOSIS — G8929 Other chronic pain: Secondary | ICD-10-CM | POA: Diagnosis not present

## 2023-03-24 MED ORDER — METHYLPREDNISOLONE 4 MG PO TBPK: ORAL_TABLET | ORAL | 0 refills | Status: DC

## 2023-03-24 NOTE — Progress Notes (Signed)
Orthopedic Spine Surgery Office Note   Assessment: Patient is a 72 y.o. female with low back pain that radiates into the right lower extremity along the lateral and anterior aspects.  Has a spondylolisthesis with lateral recess stenosis at L4/5     Plan: -Patient has tried  tens unit, Tylenol, ibuprofen, muscle relaxers, gabapentin, oral steroids, lumbar steroid injection -Since she is no longer getting lasting relief with injections, I did not recommend repeat injection -Prescribed medrol dose pak for this acute worsening of her pain -Talked about surgery as a treatment option for her, but she did not want to pursue that option -I talked about pain management as an option as well since she has tried multiple conservative treatments with me and has not gotten relief.  She wanted to try this so a referral was provided to her today -Patient should return to office on an as-needed basis     Patient expressed understanding of the plan and all questions were answered to the patient's satisfaction.    ___________________________________________________________________________     History:   Patient is a 72 y.o. female who presents today for follow-up on her lumbar spine.  Patient comes in today with acute worsening of her right lower extremity pain.  She states the pain starts in her low back and radiates into the right lower extremity along the lateral aspect.  She feels it in the right lateral thigh and in the right lateral leg.  It goes to the level of the ankle.  It does not radiate to the foot.  She states that the pain is severe and she feels it on a daily basis.  She says even small activities like moving her foot from the gas to the brake pedal will aggravate the pain.  She does not have any pain radiating into her left lower extremity.  She had previously gotten relief with injections but the last 1 only provided her with about 2 weeks of relief and then pain returned.  She has not had any  recent injury or trauma that caused this acute worsening of her pain.  No bowel or bladder incontinence.  No saddle anesthesia.   Treatments tried: tens unit, Tylenol, ibuprofen, muscle relaxers, gabapentin, oral steroids, lumbar steroid injection     Physical Exam:   General: no acute distress, appears stated age Neurologic: alert, answering questions appropriately, following commands Respiratory: unlabored breathing on room air, symmetric chest rise Psychiatric: appropriate affect, normal cadence to speech     MSK (spine):   -Strength exam                                                   Left                  Right EHL                              5/5                  5/5 TA                                 5/5  5/5 GSC                             5/5                  5/5 Knee extension            5/5                  5/5 Hip flexion                    5/5                  5/5   -Sensory exam                           Sensation intact to light touch in L3 distributions bilaterally. Decreased sensation from the knee to the midshaft tibia bilaterally. Numb in bilateral feet/ankles.    -Achilles DTR: 1/4 on the left, 1/4 on the right -Patellar tendon DTR: 1/4 on the left, 1/4 on the right  Negative straight leg raise bilaterally No beats of clonus bilaterally   Imaging: XRs of the lumbar spine from 01/06/2023 were previously independently reviewed and interpreted, showing disc height loss at L5/S1.  Spondylolisthesis at L4/5 that shifts about 1 mm between flexion and extension views.  No fracture or dislocation seen.   MRI of the lumbar spine from 11/25/2022 was previously independently reviewed and interpreted, showing bilateral lateral recess stenosis with foraminal stenosis at L4/5.  There is a spondylolisthesis at L4/5.  Central stenosis at L2/3.  No other significant stenosis seen.   DEXA scan in 2023 did not show any evidence of osteoporosis    Patient  name: Heather [name] Patient MRN: [id number] Date of visit: 03/24/23

## 2023-03-25 ENCOUNTER — Other Ambulatory Visit: Payer: Self-pay | Admitting: Internal Medicine

## 2023-03-28 ENCOUNTER — Other Ambulatory Visit: Payer: Self-pay | Admitting: Internal Medicine

## 2023-03-28 DIAGNOSIS — I10 Essential (primary) hypertension: Secondary | ICD-10-CM

## 2023-03-29 DIAGNOSIS — H01001 Unspecified blepharitis right upper eyelid: Secondary | ICD-10-CM | POA: Diagnosis not present

## 2023-03-29 DIAGNOSIS — H01004 Unspecified blepharitis left upper eyelid: Secondary | ICD-10-CM | POA: Diagnosis not present

## 2023-03-29 DIAGNOSIS — B880 Other acariasis: Secondary | ICD-10-CM | POA: Diagnosis not present

## 2023-04-29 NOTE — Progress Notes (Signed)
Subjective:    Patient ID: Heather [name], female    DOB: 04/19/1951, 72 y.o.   MRN: [id number]     HPI Heather [name] is here for follow up of her chronic medical problems.  Seeing ortho for back -  had injection - lasted 2 weeks.  She was given two options - pain clinic or surgery.  Pain varies - has good days and bad days.   Taking muscle relaxer at night.  Also taking tylenol and advil as needed. Taking gabapentin at night.    Plans on starting water aerobics.    Lantus 10 units daily - sugars well controlled  Medications and allergies reviewed with patient and updated if appropriate.  Current Outpatient Medications on File Prior to Visit  Medication Sig Dispense Refill   BD PEN NEEDLE MICRO U/F 32G X 6 MM MISC Inject into the skin as directed.     Blood Glucose Monitoring Suppl (ONE TOUCH ULTRA 2) w/Device KIT 1 each by Does not apply route 4 (four) times daily. Use device to monitor glucose levels 4 times per day; E11.9 1 kit 0   diclofenac Sodium (VOLTAREN) 1 % GEL APPLY 4 GRAMS TO AFFECTED JOINTS 4 TIMES A DAY (Patient taking differently: Apply 4 g topically daily as needed (pain).) 400 g 11   Dulaglutide (TRULICITY) 0.75 MG/0.5ML SOAJ INJECT 0.75 MG SUBCUTANEOUSLY ONE TIME PER WEEK 6 mL 0   ezetimibe (ZETIA) 10 MG tablet TAKE 1 TABLET BY MOUTH EVERY DAY 90 tablet 3   famotidine (PEPCID) 10 MG tablet Take 10 mg by mouth daily as needed for heartburn or indigestion.     gabapentin (NEURONTIN) 100 MG capsule Take 100 mg by mouth at bedtime.     gabapentin (NEURONTIN) 600 MG tablet TAKE 2 TABLETS (1,200MG  TOTAL) BY MOUTH AT BEDTIME 180 tablet 1   glucose blood (ONETOUCH ULTRA) test strip TEST ONCE DAILY 100 strip 2   hydrochlorothiazide (HYDRODIURIL) 25 MG tablet TAKE 1 TABLET (25 MG TOTAL) BY MOUTH DAILY. 90 tablet 1   Lancets (ONETOUCH DELICA PLUS LANCET33G) MISC USE TO TEST BLOOD SUGARS ONCE  DAILY 100 each 2   LANTUS SOLOSTAR 100 UNIT/ML Solostar Pen SMARTSIG:10 Unit(s)  SUB-Q Daily     losartan (COZAAR) 100 MG tablet TAKE 1 TABLET BY MOUTH EVERY DAY 90 tablet 1   ondansetron (ZOFRAN-ODT) 4 MG disintegrating tablet Take 1 tablet (4 mg total) by mouth every 8 (eight) hours as needed for nausea or vomiting. Please schedule an office visit for further refills. 20 tablet 0   tiZANidine (ZANAFLEX) 4 MG tablet Take 1 tablet (4 mg total) by mouth every 8 (eight) hours as needed (back pain, muscle spasms). 60 tablet 2   XDEMVY 0.25 % SOLN Apply 1 drop to eye 2 (two) times daily.     ibuprofen (ADVIL) 200 MG tablet Take 400 mg by mouth every 8 (eight) hours as needed for moderate pain.     No current facility-administered medications on file prior to visit.     Review of Systems  Constitutional:  Negative for fever.  Respiratory:  Positive for cough (occ) and shortness of breath (with exertion using cane). Negative for wheezing.   Cardiovascular:  Negative for chest pain, palpitations and leg swelling.  Musculoskeletal:  Positive for back pain (wtih pain down right leg).  Neurological:  Positive for numbness (RLE from back). Negative for light-headedness and headaches.       Objective:   Vitals:   04/30/23  0919  BP: (!) 140/100  Pulse: 80  Temp: 98.4 F (36.9 C)  SpO2: 100%   BP Readings from Last 3 Encounters:  04/30/23 (!) 140/100  01/21/23 (!) 175/83  01/06/23 (!) 158/84   Wt Readings from Last 3 Encounters:  04/30/23 173 lb (78.5 kg)  01/06/23 172 lb (78 kg)  10/29/22 172 lb (78 kg)   Body mass index is 32.69 kg/m.    Physical Exam Constitutional:      General: She is not in acute distress.    Appearance: Normal appearance.  HENT:     Head: Normocephalic and atraumatic.  Eyes:     Conjunctiva/sclera: Conjunctivae normal.  Cardiovascular:     Rate and Rhythm: Normal rate and regular rhythm.     Heart sounds: Murmur (2/6 sys) heard.  Pulmonary:     Effort: Pulmonary effort is normal. No respiratory distress.     Breath sounds: Normal  breath sounds. No wheezing.  Musculoskeletal:     Cervical back: Neck supple.     Right lower leg: No edema.     Left lower leg: No edema.  Lymphadenopathy:     Cervical: No cervical adenopathy.  Skin:    General: Skin is warm and dry.     Findings: No rash.  Neurological:     Mental Status: She is alert. Mental status is at baseline.  Psychiatric:        Mood and Affect: Mood normal.        Behavior: Behavior normal.        Lab Results  Component Value Date   WBC 5.9 10/29/2022   HGB 11.5 (L) 10/29/2022   HCT 34.7 (L) 10/29/2022   PLT 309.0 10/29/2022   GLUCOSE 111 (H) 10/29/2022   CHOL 220 (H) 10/29/2022   TRIG 169.0 (H) 10/29/2022   HDL 55.20 10/29/2022   LDLDIRECT 108.0 04/27/2019   LDLCALC 131 (H) 10/29/2022   ALT 12 10/29/2022   AST 14 10/29/2022   NA 137 10/29/2022   K 4.3 10/29/2022   CL 101 10/29/2022   CREATININE 0.82 10/29/2022   BUN 14 10/29/2022   CO2 29 10/29/2022   TSH 1.11 04/29/2021   HGBA1C 6.8 (H) 10/29/2022   MICROALBUR 0.8 04/29/2022     Assessment & Plan:    See Problem List for Assessment and Plan of chronic medical problems.

## 2023-04-29 NOTE — Patient Instructions (Addendum)
      Blood work was ordered.      Echo ordered.  Ct of your heart was ordered   Medications changes include :   humalog for sliding scale    A referral was ordered neurosurgery and pain management and someone will call you to schedule an appointment.     Return in about 6 months (around 10/29/2023) for Physical Exam.

## 2023-04-30 ENCOUNTER — Encounter: Payer: Self-pay | Admitting: Internal Medicine

## 2023-04-30 ENCOUNTER — Ambulatory Visit (INDEPENDENT_AMBULATORY_CARE_PROVIDER_SITE_OTHER): Payer: Medicare Other | Admitting: Internal Medicine

## 2023-04-30 VITALS — BP 134/90 | HR 80 | Temp 98.4°F | Ht 61.0 in | Wt 173.0 lb

## 2023-04-30 DIAGNOSIS — Z136 Encounter for screening for cardiovascular disorders: Secondary | ICD-10-CM

## 2023-04-30 DIAGNOSIS — E0842 Diabetes mellitus due to underlying condition with diabetic polyneuropathy: Secondary | ICD-10-CM | POA: Diagnosis not present

## 2023-04-30 DIAGNOSIS — R011 Cardiac murmur, unspecified: Secondary | ICD-10-CM | POA: Diagnosis not present

## 2023-04-30 DIAGNOSIS — E1142 Type 2 diabetes mellitus with diabetic polyneuropathy: Secondary | ICD-10-CM

## 2023-04-30 DIAGNOSIS — E782 Mixed hyperlipidemia: Secondary | ICD-10-CM

## 2023-04-30 DIAGNOSIS — I10 Essential (primary) hypertension: Secondary | ICD-10-CM | POA: Diagnosis not present

## 2023-04-30 DIAGNOSIS — M5416 Radiculopathy, lumbar region: Secondary | ICD-10-CM

## 2023-04-30 LAB — CBC WITH DIFFERENTIAL/PLATELET
Basophils Absolute: 0.1 10*3/uL (ref 0.0–0.1)
Basophils Relative: 0.6 % (ref 0.0–3.0)
Eosinophils Absolute: 0.2 10*3/uL (ref 0.0–0.7)
Eosinophils Relative: 2 % (ref 0.0–5.0)
HCT: 41.2 % (ref 36.0–46.0)
Hemoglobin: 13.8 g/dL (ref 12.0–15.0)
Lymphocytes Relative: 25.1 % (ref 12.0–46.0)
Lymphs Abs: 2.4 10*3/uL (ref 0.7–4.0)
MCHC: 33.4 g/dL (ref 30.0–36.0)
MCV: 79.9 fL (ref 78.0–100.0)
Monocytes Absolute: 0.8 10*3/uL (ref 0.1–1.0)
Monocytes Relative: 8.2 % (ref 3.0–12.0)
Neutro Abs: 6.2 10*3/uL (ref 1.4–7.7)
Neutrophils Relative %: 64.1 % (ref 43.0–77.0)
Platelets: 373 10*3/uL (ref 150.0–400.0)
RBC: 5.16 Mil/uL — ABNORMAL HIGH (ref 3.87–5.11)
RDW: 15 % (ref 11.5–15.5)
WBC: 9.6 10*3/uL (ref 4.0–10.5)

## 2023-04-30 LAB — LIPID PANEL
Cholesterol: 234 mg/dL — ABNORMAL HIGH (ref 0–200)
HDL: 63.4 mg/dL (ref >39.00)
LDL Cholesterol: 131 mg/dL — ABNORMAL HIGH (ref 0–99)
NonHDL: 170.68
Total CHOL/HDL Ratio: 4
Triglycerides: 197 mg/dL — ABNORMAL HIGH (ref 0.0–149.0)
VLDL: 39.4 mg/dL (ref 0.0–40.0)

## 2023-04-30 LAB — MICROALBUMIN / CREATININE URINE RATIO
Creatinine,U: 75.9 mg/dL
Microalb Creat Ratio: 1 mg/g (ref 0.0–30.0)
Microalb, Ur: 0.7 mg/dL (ref 0.0–1.9)

## 2023-04-30 LAB — COMPREHENSIVE METABOLIC PANEL
ALT: 14 U/L (ref 0–35)
AST: 17 U/L (ref 0–37)
Albumin: 4.6 g/dL (ref 3.5–5.2)
Alkaline Phosphatase: 58 U/L (ref 39–117)
BUN: 16 mg/dL (ref 6–23)
CO2: 30 meq/L (ref 19–32)
Calcium: 10.5 mg/dL (ref 8.4–10.5)
Chloride: 99 meq/L (ref 96–112)
Creatinine, Ser: 0.85 mg/dL (ref 0.40–1.20)
GFR: 67.42 mL/min (ref >60.00)
Glucose, Bld: 97 mg/dL (ref 70–99)
Potassium: 4 meq/L (ref 3.5–5.1)
Sodium: 137 meq/L (ref 135–145)
Total Bilirubin: 1.5 mg/dL — ABNORMAL HIGH (ref 0.2–1.2)
Total Protein: 7.6 g/dL (ref 6.0–8.3)

## 2023-04-30 LAB — HEMOGLOBIN A1C: Hgb A1c MFr Bld: 7 % — ABNORMAL HIGH (ref 4.6–6.5)

## 2023-04-30 MED ORDER — ONDANSETRON 4 MG PO TBDP: 4.0000 mg | ORAL_TABLET | Freq: Three times a day (TID) | ORAL | 0 refills | Status: AC | PRN

## 2023-04-30 MED ORDER — HYDROCHLOROTHIAZIDE 25 MG PO TABS: 25.0000 mg | ORAL_TABLET | Freq: Every day | ORAL | 1 refills | Status: DC

## 2023-04-30 MED ORDER — LANTUS SOLOSTAR 100 UNIT/ML ~~LOC~~ SOPN: 10.0000 [IU] | PEN_INJECTOR | Freq: Every morning | SUBCUTANEOUS | 0 refills | Status: DC

## 2023-04-30 MED ORDER — IBUPROFEN 200 MG PO TABS: 400.0000 mg | ORAL_TABLET | Freq: Three times a day (TID) | ORAL | 1 refills | Status: DC | PRN

## 2023-04-30 MED ORDER — LOSARTAN POTASSIUM 100 MG PO TABS
100.0000 mg | ORAL_TABLET | Freq: Every day | ORAL | 1 refills | Status: DC
Start: 2023-04-30 — End: 2023-07-26

## 2023-04-30 MED ORDER — GABAPENTIN 600 MG PO TABS: 600.0000 mg | ORAL_TABLET | Freq: Every day | ORAL | 1 refills | Status: DC

## 2023-04-30 MED ORDER — INSULIN LISPRO (1 UNIT DIAL) 100 UNIT/ML (KWIKPEN): PEN_INJECTOR | SUBCUTANEOUS | 11 refills | Status: AC

## 2023-04-30 MED ORDER — BD PEN NEEDLE MICRO U/F 32G X 6 MM MISC: 3 refills | Status: AC

## 2023-04-30 MED ORDER — TIZANIDINE HCL 4 MG PO TABS: 4.0000 mg | ORAL_TABLET | Freq: Three times a day (TID) | ORAL | 2 refills | Status: AC | PRN

## 2023-04-30 MED ORDER — GABAPENTIN 100 MG PO CAPS: 100.0000 mg | ORAL_CAPSULE | Freq: Every day | ORAL | 1 refills | Status: DC

## 2023-04-30 MED ORDER — TRULICITY 0.75 MG/0.5ML ~~LOC~~ SOAJ: 0.7500 mg | SUBCUTANEOUS | 0 refills | Status: DC

## 2023-04-30 NOTE — Assessment & Plan Note (Signed)
Chronic Controlled Continue gabapentin 1200 mg at bedtime 

## 2023-04-30 NOTE — Assessment & Plan Note (Signed)
Chronic Blood pressure controlled Cmp, cbc Continue hydrochlorothiazide 25 mg daily, losartan 100 mg daily

## 2023-04-30 NOTE — Assessment & Plan Note (Addendum)
Chronic  Lab Results  Component Value Date   HGBA1C 6.8 (H) 10/29/2022   Sugars controlled Check A1c, urine microalbumin Encouraged regular exercise ,diabetic diet Continue trulicity  - titrate as tolerated-currently on 0.75 mg weekly Continue 10 units Lantus daily She is concerned that Trulicity may not be available wants to have some insulin per sliding scale if needed-Humalog sent to pharmacy with sliding scale parameters

## 2023-04-30 NOTE — Assessment & Plan Note (Addendum)
Acute Following with orthopedics - Dr Christell Constant - advised surgery or pain management Agree with second opinion-referral to neurosurgery Will also refer to pain management to discuss options of medication that may help Continue gabapentin 1200 mg at bedtime, tizanidine 4 mg at night

## 2023-04-30 NOTE — Assessment & Plan Note (Addendum)
Chronic Discussed importance of getting LDL well-controlled Did have a recent mammogram that showed breast artery calcifications-will get CT CAC and that we will determine if further treatment is needed Check lipid panel, cmp Continue Zetia 10 mg daily

## 2023-04-30 NOTE — Assessment & Plan Note (Signed)
2/6 systolic Has a history of tricuspid regurgitation on prior echo Will recheck echocardiogram

## 2023-05-07 ENCOUNTER — Ambulatory Visit (HOSPITAL_COMMUNITY)
Admission: RE | Admit: 2023-05-07 | Discharge: 2023-05-07 | Disposition: A | Discharge status: Home / Self Care | Payer: Self-pay | Source: Ambulatory Visit | Attending: Internal Medicine | Admitting: Internal Medicine

## 2023-05-07 DIAGNOSIS — Z136 Encounter for screening for cardiovascular disorders: Secondary | ICD-10-CM | POA: Insufficient documentation

## 2023-05-10 ENCOUNTER — Encounter: Payer: Self-pay | Admitting: Internal Medicine

## 2023-05-18 ENCOUNTER — Encounter: Payer: Self-pay | Admitting: Physical Medicine & Rehabilitation

## 2023-05-20 DIAGNOSIS — M48062 Spinal stenosis, lumbar region with neurogenic claudication: Secondary | ICD-10-CM | POA: Diagnosis not present

## 2023-05-20 DIAGNOSIS — M4316 Spondylolisthesis, lumbar region: Secondary | ICD-10-CM | POA: Diagnosis not present

## 2023-05-20 DIAGNOSIS — M47816 Spondylosis without myelopathy or radiculopathy, lumbar region: Secondary | ICD-10-CM | POA: Diagnosis not present

## 2023-06-03 DIAGNOSIS — B880 Other acariasis: Secondary | ICD-10-CM | POA: Diagnosis not present

## 2023-06-08 ENCOUNTER — Ambulatory Visit (HOSPITAL_COMMUNITY): Payer: Medicare Other | Attending: Cardiology

## 2023-06-08 ENCOUNTER — Encounter: Payer: Self-pay | Admitting: Internal Medicine

## 2023-06-08 DIAGNOSIS — R011 Cardiac murmur, unspecified: Secondary | ICD-10-CM | POA: Insufficient documentation

## 2023-06-08 DIAGNOSIS — E782 Mixed hyperlipidemia: Secondary | ICD-10-CM

## 2023-06-08 LAB — ECHOCARDIOGRAM COMPLETE
AR max vel: 1.51 cm2
AV Area VTI: 1.52 cm2
AV Area mean vel: 1.52 cm2
AV Mean grad: 18 mm[Hg]
AV Peak grad: 36.7 mm[Hg]
Ao pk vel: 3.03 m/s
Area-P 1/2: 3.02 cm2
S' Lateral: 2.3 cm

## 2023-06-09 MED ORDER — ROSUVASTATIN CALCIUM 5 MG PO TABS: 5.0000 mg | ORAL_TABLET | Freq: Every day | ORAL | 1 refills | Status: DC

## 2023-06-09 MED ORDER — TRULICITY 1.5 MG/0.5ML ~~LOC~~ SOAJ: 1.5000 mg | SUBCUTANEOUS | 0 refills | Status: DC

## 2023-06-10 ENCOUNTER — Encounter: Payer: Medicare Other | Attending: Physical Medicine & Rehabilitation | Admitting: Physical Medicine & Rehabilitation

## 2023-06-10 ENCOUNTER — Encounter: Payer: Self-pay | Admitting: Physical Medicine & Rehabilitation

## 2023-06-10 ENCOUNTER — Encounter: Payer: Self-pay | Admitting: Internal Medicine

## 2023-06-10 VITALS — BP 162/87 | HR 75 | Ht 61.0 in | Wt 179.0 lb

## 2023-06-10 DIAGNOSIS — I071 Rheumatic tricuspid insufficiency: Secondary | ICD-10-CM | POA: Insufficient documentation

## 2023-06-10 DIAGNOSIS — I77810 Thoracic aortic ectasia: Secondary | ICD-10-CM | POA: Insufficient documentation

## 2023-06-10 DIAGNOSIS — M48062 Spinal stenosis, lumbar region with neurogenic claudication: Secondary | ICD-10-CM | POA: Insufficient documentation

## 2023-06-10 DIAGNOSIS — I5189 Other ill-defined heart diseases: Secondary | ICD-10-CM | POA: Insufficient documentation

## 2023-06-10 DIAGNOSIS — I340 Nonrheumatic mitral (valve) insufficiency: Secondary | ICD-10-CM | POA: Insufficient documentation

## 2023-06-10 DIAGNOSIS — I350 Nonrheumatic aortic (valve) stenosis: Secondary | ICD-10-CM | POA: Insufficient documentation

## 2023-06-10 MED ORDER — TRAMADOL HCL 50 MG PO TABS: 50.0000 mg | ORAL_TABLET | Freq: Two times a day (BID) | ORAL | 0 refills | Status: DC | PRN

## 2023-06-10 NOTE — Therapy (Signed)
OUTPATIENT PHYSICAL THERAPY THORACOLUMBAR EVALUATION   Patient Name: Heather [name] MRN: [id number] DOB:05-12-1951, 73 y.o., female Today's Date: 06/11/2023  END OF SESSION:  PT End of Session - 06/11/23 0900     Visit Number 1    Number of Visits 6    Date for PT Re-Evaluation 08/09/23    PT Start Time 0830    PT Stop Time 0910    PT Time Calculation (min) 40 min    Activity Tolerance Patient limited by pain    Behavior During Therapy [facility] for tasks assessed/performed             Past Medical History:  Diagnosis Date   Arthritis    Colon polyps    Diabetes mellitus without complication (HCC)    Environmental allergies    GERD (gastroesophageal reflux disease)    Heart murmur    since birth never a problem per pt   History of DVT (deep vein thrombosis) 01/16/1969   History of hiatal hernia    History of transfusion    Hyperlipidemia    Hypertension    Interstitial cystitis    Dr Brunilda Payor   PONV (postoperative nausea and vomiting)    Past Surgical History:  Procedure Laterality Date   APPENDECTOMY     BILATERAL SALPINGOOPHORECTOMY     painful cysts   CATARACT EXTRACTION, BILATERAL     COLONOSCOPY  2005   negative; Dr Kinnie Scales. F/U declined   CYSTOSCOPY     X 4; Dr Brunilda Payor   INSERTION OF MESH N/A 09/17/2021   Procedure: INSERTION OF MESH;  Surgeon: Karie Soda, MD;  Location: [facility];  Service: General;  Laterality: N/A;   LAPAROSCOPIC RIGHT COLECTOMY Right 08/19/2015   Procedure: LAPAROSCOPIC ASSISTED  RIGHT COLECTOMY;  Surgeon: Ovidio Kin, MD;  Location: [facility];  Service: General;  Laterality: Right;   LYSIS OF ADHESION N/A 09/17/2021   Procedure: LYSIS OF ADHESION;  Surgeon: Karie Soda, MD;  Location: [facility];  Service: General;  Laterality: N/A;   PREMAGLINANT POLYPS     TOTAL ABDOMINAL HYSTERECTOMY     metromenorrhagia   VENTRAL HERNIA REPAIR N/A 09/17/2021   Procedure: LAPAROSCOPIC VENTRAL WALL HERNIA REPAIR BILATERAL TAP BLOCK;  Surgeon: Karie Soda, MD;   Location: [facility];  Service: General;  Laterality: N/A;   Patient Active Problem List   Diagnosis Date Noted   Grade II diastolic dysfunction 06/10/2023   Mitral regurgitation 06/10/2023   Aortic stenosis 06/10/2023   Tricuspid regurgitation 06/10/2023   Mild ascending aorta dilation (HCC) 06/10/2023   Murmur, cardiac 04/30/2023   Incarcerated incisional hernia s/p lap repair w mesh 09/17/2021 09/17/2021   Osteopenia 07/05/2021   Lumbar back pain with radiculopathy affecting right lower extremity 03/03/2021   Fall 03/03/2021   Aortic atherosclerosis (HCC) 10/25/2020   GERD (gastroesophageal reflux disease) 04/26/2020   Vitamin D deficiency 04/25/2020   Arthralgia 10/26/2019   Dry cough 11/22/2017   Nocturnal leg cramps 02/02/2017   DOE (dyspnea on exertion) 08/04/2016   Obesity (BMI 30.0-34.9) 08/04/2016   Generalized osteoarthritis of hand 02/04/2016   Diabetic polyneuropathy associated with diabetes mellitus due to underlying condition (HCC) 11/27/2015   Hiatal hernia, large 08/02/2015   Colon polyps 08/02/2015   Iron deficiency 04/21/2015   Diabetes type 2, controlled (HCC) 05/03/2014   Hyperlipidemia, mixed 03/18/2014   Interstitial cystitis 07/08/2012   Essential hypertension 02/10/2011    PCP: Pincus Sanes, MD   REFERRING PROVIDER: Tressie Stalker, MD  REFERRING DIAG: M43.16 (ICD-10-CM) -  Spondylolisthesis of lumbar region  Rationale for Evaluation and Treatment: Rehabilitation  THERAPY DIAG:  Radiculopathy, lumbar region  Muscle weakness (generalized)  Difficulty in walking, not elsewhere classified  ONSET DATE: chronic exacerbation in 11/2022  SUBJECTIVE:                                                                                                                                                                                           SUBJECTIVE STATEMENT: Patient relates a history of chronic low back pain as well as RLE pain.  She has been offered  surgery as a corrective measure and is here to fulfill insurance requirements to authorize surgical intervention.  Previous treatment included medication, steroidal injections and self guided water based exercise w/o any relief of symptoms.  Patient very apprehensive about movement as symptoms occur randomly w/o warning.  PERTINENT HISTORY:    PAIN:  Are you having pain? Yes: NPRS scale: 9/10 at worst Pain location: low back/ RLE Pain description: ache, sharp, burning Aggravating factors: random activity and extension biased positions Relieving factors: sitting  PRECAUTIONS: None  RED FLAGS: None   WEIGHT BEARING RESTRICTIONS: No  FALLS:  Has patient fallen in last 6 months? No   OCCUPATION: retired  PLOF: Independent  PATIENT GOALS: To avoid surgery  NEXT MD VISIT: TBD  OBJECTIVE:  Note: Objective measures were completed at Evaluation unless otherwise noted.  DIAGNOSTIC FINDINGS:    PATIENT SURVEYS:  FOTO 39(52 predicted)  MUSCLE LENGTH: Hamstrings: WFL measured in sitting   POSTURE: decreased lumbar lordosis  PALPATION: deferred  LUMBAR ROM: deferred due to patient apprehension regarding exacerbation of symptoms  AROM eval  Flexion   Extension   Right lateral flexion   Left lateral flexion   Right rotation   Left rotation    (Blank rows = not tested)  LOWER EXTREMITY ROM:   WFL for gait and transfers  Active  Right eval Left eval  Hip flexion    Hip extension    Hip abduction    Hip adduction    Hip internal rotation    Hip external rotation    Knee flexion    Knee extension    Ankle dorsiflexion    Ankle plantarflexion    Ankle inversion    Ankle eversion     (Blank rows = not tested)  LOWER EXTREMITY MMT:    MMT Right eval Left eval  Hip flexion    Hip extension    Hip abduction    Hip adduction    Hip internal rotation    Hip external rotation    Knee flexion    Knee extension  Ankle dorsiflexion    Ankle  plantarflexion    Ankle inversion    Ankle eversion     (Blank rows = not tested)  LUMBAR SPECIAL TESTS:  Straight leg raise test: Negative and Slump test: Positive  FUNCTIONAL TESTS:  30 seconds chair stand test 1 reps  GAIT: Distance walked: 35ft x2 Assistive device utilized: Single point cane Level of assistance: Modified independence Comments: slow cadence with antalgic gait  TREATMENT DATE:  06/11/23 Eval and HEP                                                                                                                         PATIENT EDUCATION:  Education details: Discussed eval findings, rehab rationale and POC and patient is in agreement  Person educated: Patient Education method: Explanation Education comprehension: verbalized understanding and needs further education  HOME EXERCISE PROGRAM: Access Code: [email or web address] URL: [email or web address] Date: 06/11/2023 Prepared by: Gustavus Bryant  Exercises - Seated Shoulder Horizontal Abduction with Resistance  - 1 x daily - 5 x weekly - 2 sets - 10 reps - Seated Elbow Flexion with Resistance  - 1 x daily - 5 x weekly - 2 sets - 10 reps - Seated Chest Press with Resistance Band  - 1 x daily - 5 x weekly - 2 sets - 10 reps  ASSESSMENT:  CLINICAL IMPRESSION: Patient is a 73 y.o. female who was seen today for physical therapy evaluation and treatment for Chronic low back and RLE radiculopathy due to underlying degenerative changes including spondylolisthesis and nerve root impingement.  Patient very apprehensive towards movement as symptoms occur randomly and are debilitating once aggravated.   OPPT will focus on seated core strengthening as patient considers surgical intervention.  OBJECTIVE IMPAIRMENTS: Abnormal gait, decreased activity tolerance, decreased endurance, decreased mobility, difficulty walking, decreased ROM, decreased strength, increased muscle spasms, improper body mechanics, postural  dysfunction, obesity, and pain.   ACTIVITY LIMITATIONS: carrying, lifting, bending, sitting, and standing  PERSONAL FACTORS: Age, Education, Fitness, Past/current experiences, and Time since onset of injury/illness/exacerbation are also affecting patient's functional outcome.   REHAB POTENTIAL: Fair based on extent of degenerative changes and need for surgery  as well as unsuccessful conservative treatment  CLINICAL DECISION MAKING: Stable/uncomplicated  EVALUATION COMPLEXITY: Low   GOALS: Goals reviewed with patient? No  SHORT TERM GOALS=LONG TERM GOALS: Target date: 08/09/23  Patient to demonstrate independence in HEP  Baseline: [id number] Goal status: INITIAL  2.  Patient will score at least 52% on FOTO to signify clinically meaningful improvement in functional abilities.   Baseline: 39% Goal status: INITIAL  3.  Patient will acknowledge 6/10 pain at least once during episode of care   Baseline: 9/10 Goal status: INITIAL  4.  Patient will increase 30s chair stand reps from 1 to 2 with/without arms to demonstrate and improved functional ability with less pain/difficulty as well as reduce fall risk.  Baseline: 1 Goal status: INITIAL     PLAN:  PT FREQUENCY: 1x/week  PT DURATION: 6 weeks  PLANNED INTERVENTIONS: 97164- PT Re-evaluation, 97110-Therapeutic exercises, 97530- Therapeutic activity, [id number]- Neuromuscular re-education, 97535- Self Care, [id number]- Manual therapy, [id number]- Gait training, [phone]- Aquatic Therapy, Stair training, and Dry Needling.  PLAN FOR NEXT SESSION: HEP review and update, manual techniques as appropriate, aerobic tasks, ROM and flexibility activities, strengthening and PREs, TPDN, gait and balance training as needed   Date of referral: 05/31/23 Referring provider: Tressie Stalker, MD Referring diagnosis? M43.16 (ICD-10-CM) - Spondylolisthesis of lumbar region Treatment diagnosis? (if different than referring diagnosis) M43.16 (ICD-10-CM) -  Spondylolisthesis of lumbar region  What was this (referring dx) caused by? Arthritis  Nature of Condition: Chronic (continuous duration > 3 months)   Laterality: Rt  Current Functional Measure Score: FOTO 39%  Objective measurements identify impairments when they are compared to normal values, the uninvolved extremity, and prior level of function.  [x]  Yes  []  No  Objective assessment of functional ability: Severe functional limitations   Briefly describe symptoms: Patient relates a history of chronic low back pain as well as RLE pain.  She has been offered surgery as a corrective measure and is here to fulfill insurance requirements to authorize surgical intervention.  Previous treatment included medication, steroidal injections and self guided water based exercise w/o any relief of symptoms.  Patient very apprehensive about movement as symptoms occur randomly w/o warning.  How did symptoms start: degenerative  Average pain intensity:  Last 24 hours: 9  Past week: 7  How often does the pt experience symptoms? Frequently  How much have the symptoms interfered with usual daily activities? Extremely  How has condition changed since care began at this facility? NA - initial visit  In general, how is the patients overall health? Good   BACK PAIN (STarT Back Screening Tool) Has pain spread down the leg(s) at some time in the last 2 weeks? y Has there been pain in the shoulder or neck at some time in the last 2 weeks? n Has the pt only walked short distances because of back pain? y Has patient dressed more slowly because of back pain in the past 2 weeks? y Does patient think it's not safe for a person with this condition to be physically active? y Does patient have worrying thoughts a lot of the time? y Does patient feel back pain is terrible and will never get any better? y Has patient stopped enjoying things they usually enjoy? y    Hildred Laser, PT 06/11/2023, 9:43 AM

## 2023-06-10 NOTE — Patient Instructions (Signed)
Please call if tramadol causes nausea or vomiting  Also please use tramadol only when having a bad day, otherwise use tylenol and ibuprofen combo , no more than 5 tablets per day

## 2023-06-10 NOTE — Progress Notes (Addendum)
Subjective:    Patient ID: Heather [name], female    DOB: August 19, 1950, 73 y.o.   MRN: [id number]  HPI  Takes Tylenol and Ibuprofen combination  Takes up to 4 Advil dual action (acetaminophen 250mg  and 125 ibuprofen) Max up to 4 tabs QID= 4000mg  acetaminophen and 2000mg   ibuprofen    Latest Ref Rng & Units 04/30/2023   10:22 AM 10/29/2022    9:57 AM 04/29/2022    8:40 AM  BMP  Glucose 70 - 99 mg/dL 97  235  573   BUN 6 - 23 mg/dL 16  14  11    Creatinine 0.40 - 1.20 mg/dL 2.20  2.54  2.70   Sodium 135 - 145 mEq/L 137  137  138   Potassium 3.5 - 5.1 mEq/L 4.0  4.3  4.3   Chloride 96 - 112 mEq/L 99  101  102   CO2 19 - 32 mEq/L 30  29  29    Calcium 8.4 - 10.5 mg/dL 62.3  9.6  76.2     Gabapentin and tizanidine cause sedation  Tried tramadol after last surgery (abd surgery)  Tried Hydrocodone 5mg  last fall   Seen by Ortho spine November 2024 Seen by Neurosurgery January 2025   Went to water aerobic but had increased pain  PT was ordered by Neurosurgery and has appt tomorrow   Pain in back and pain down the leg at the same time, has had to use cane to prevent falls  Tried TENS unit which was not helpful    Pain gets to a 9/10 at times  Lumbosacral Transforaminal Epidural Steroid Injection - Sub-Pedicular Approach with Fluoroscopic Guidance   Patient: Heather [name]                                                      Date of Birth: Dec 18, 1950 MRN: [id number] PCP: Pincus Sanes, MD                                                        Visit Date: 01/21/2023   Universal Protocol:    Date/Time: 01/21/2023   Consent Given By: the patient   Position: PRONE   Additional Comments: Vital signs were monitored before and after the procedure. Patient was prepped and draped in the usual sterile fashion. The correct patient, procedure, and site was verified.     Injection Procedure Details:    Procedure diagnoses: Lumbar radiculopathy [M54.16]     Meds Administered:      Meds ordered this encounter  Medications   methylPREDNISolone acetate (DEPO-MEDROL) injection 80 mg      Laterality: Right   Location/Site: L4   Needle:5.0 in., 22 ga.  Short bevel or Quincke spinal needle   Needle Placement: Transforaminal     Pain Inventory Average Pain 7 Pain Right Now 6 My pain is burning and aching  In the last 24 hours, has pain interfered with the following? General activity 7 Relation with others 7 Enjoyment of life 7 What TIME of day is your pain at its worst? daytime and evening Sleep (in general) Fair  Pain is worse with: walking, standing, some  activites, and reaching Pain improves with: rest and medication Relief from Meds: 3  walk without assistance use a walker how many minutes can you walk? 30 minutes slowly do you drive?  yes  retired I need assistance with the following:  household duties Do you have any goals in this area?  yes  bowel control problems numbness trouble walking  Any changes since last visit?  no  Any changes since last visit?  no    Family History  Problem Relation Age of Onset   Heart attack Mother        in 61s   Hypertension Mother    Stroke Mother         in 62s   Other Father        Deceased, 38   Breast cancer Sister    Healthy Brother    Healthy Daughter    Healthy Son    Stroke Maternal Grandfather 50   Heart attack Paternal Grandfather        in 74s   Diabetes Neg Hx    Social History   Socioeconomic History   Marital status: Widowed    Spouse name: Not on file   Number of children: 2   Years of education: Not on file   Highest education level: Not on file  Occupational History   Not on file  Tobacco Use   Smoking status: Former    Current packs/day: 0.00    Types: Cigarettes    Quit date: 05/18/1984    Years since quitting: 39.0   Smokeless tobacco: Never   Tobacco comments:    smoked 1972-1986, up to 1 ppd  Vaping Use   Vaping status: Never Used  Substance and  Sexual Activity   Alcohol use: No    Alcohol/week: 0.0 standard drinks of alcohol   Drug use: No   Sexual activity: Never  Other Topics Concern   Not on file  Social History Narrative   Lives with brother, daughter and 2 grandchildren in a 3 story home.  Has 2 children.  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     Highest level of education:  2 years of graduate school   Social Drivers of Health   Financial Resource Strain: Low Risk  (07/03/2022)   Overall Financial Resource Strain (CARDIA)    Difficulty of Paying Living Expenses: Not hard at all  Food Insecurity: No Food Insecurity (07/03/2022)   Hunger Vital Sign    Worried About Running Out of Food in the Last Year: Never true    Ran Out of Food in the Last Year: Never true  Transportation Needs: No Transportation Needs (07/03/2022)   PRAPARE - Administrator, Civil Service (Medical): No    Lack of Transportation (Non-Medical): No  Physical Activity: Inactive (07/03/2022)   Exercise Vital Sign    Days of Exercise per Week: 0 days    Minutes of Exercise per Session: 0 min  Stress: No Stress Concern Present (06/27/2021)   [employer] of Occupational Health - Occupational Stress Questionnaire    Feeling of Stress : Not at all  Social Connections: Socially Isolated (07/03/2022)   Social Connection and Isolation Panel [NHANES]    Frequency of Communication with Friends and Family: More than three times a week    Frequency of Social Gatherings with Friends and Family: Once a week    Attends Religious Services: Never  Active Member of Clubs or Organizations: No    Attends Banker Meetings: Never    Marital Status: Widowed   Past Surgical History:  Procedure Laterality Date   APPENDECTOMY     BILATERAL SALPINGOOPHORECTOMY     painful cysts   CATARACT EXTRACTION, BILATERAL     COLONOSCOPY  2005   negative; Dr Kinnie Scales. F/U declined   CYSTOSCOPY     X  4; Dr Brunilda Payor   INSERTION OF MESH N/A 09/17/2021   Procedure: INSERTION OF MESH;  Surgeon: Karie Soda, MD;  Location: [facility];  Service: General;  Laterality: N/A;   LAPAROSCOPIC RIGHT COLECTOMY Right 08/19/2015   Procedure: LAPAROSCOPIC ASSISTED  RIGHT COLECTOMY;  Surgeon: Ovidio Kin, MD;  Location[facility];  Service: General;  Laterality: Right;   LYSIS OF ADHESION N/A 09/17/2021   Procedure: LYSIS OF ADHESION;  Surgeon: Karie Soda, MD;  Location: [facility];  Service: General;  Laterality: N/A;   PREMAGLINANT POLYPS     TOTAL ABDOMINAL HYSTERECTOMY     metromenorrhagia   VENTRAL HERNIA REPAIR N/A 09/17/2021   Procedure: LAPAROSCOPIC VENTRAL WALL HERNIA REPAIR BILATERAL TAP BLOCK;  Surgeon: Karie Soda, MD;  Location: [facility];  Service: General;  Laterality: N/A;   Past Medical History:  Diagnosis Date   Arthritis    Colon polyps    Diabetes mellitus without complication (HCC)    Environmental allergies    GERD (gastroesophageal reflux disease)    Heart murmur    since birth never a problem per pt   History of DVT (deep vein thrombosis) 01/16/1969   History of hiatal hernia    History of transfusion    Hyperlipidemia    Hypertension    Interstitial cystitis    Dr Brunilda Payor   PONV (postoperative nausea and vomiting)    Ht 5\' 1"  (1.549 m)   Wt 179 lb (81.2 kg)   BMI 33.82 kg/m   Opioid Risk Score:   Fall Risk Score:  `1  Depression screen [facility] 2/9     06/10/2023    1:52 PM 09/09/2022    2:34 PM 07/03/2022    9:18 AM 07/03/2022    9:16 AM 04/29/2022    8:14 AM 10/28/2021    9:57 AM 06/27/2021    9:37 AM  Depression screen PHQ 2/9  Decreased Interest 0 0 0 0 0 0 0  Down, Depressed, Hopeless 1 0 0 0 0 2 0  PHQ - 2 Score 1 0 0 0 0 2 0  Altered sleeping 0    0 2   Tired, decreased energy 0    1 1   Change in appetite 0    1 1   Feeling bad or failure about yourself  0    0 1   Trouble concentrating 0    0 1   Moving slowly or fidgety/restless 0    0 0   Suicidal thoughts 0    0 0    PHQ-9 Score 1    2 8    Difficult doing work/chores Somewhat difficult    Not difficult at all Somewhat difficult       Review of Systems  Musculoskeletal:  Positive for back pain and gait problem.  All other systems reviewed and are negative.     Objective:   Physical Exam Vitals and nursing note reviewed.  Constitutional:      Appearance: She is obese.  HENT:     Head: Normocephalic and atraumatic.  Eyes:  Extraocular Movements: Extraocular movements intact.     Conjunctiva/sclera: Conjunctivae normal.     Pupils: Pupils are equal, round, and reactive to light.  Musculoskeletal:        General: No swelling or tenderness.     Right lower leg: No edema.     Left lower leg: No edema.     Comments: Reduced Left hip internal rotation  Skin:    General: Skin is warm and dry.  Neurological:     Mental Status: She is alert and oriented to person, place, and time.  Psychiatric:        Mood and Affect: Mood normal.        Behavior: Behavior normal.   Mild tenderness to palpation Right lumbar paraspinal  Sensation absent to pp below supramalleolar area   Motor strength is 5/5 bilateral hip flexor knee extensor ankle dorsiflexor Deep tendon reflexes 2+ bilateral knees 0 bilateral ankles. Ambulates without assistive device she has a forward flexed posture. Lumbar range of motion is 0 to 25% flexion extension lateral bending and rotation.      Assessment & Plan:    Lumbar spinal stenosis L4-5 level due to degenerative spondylolisthesis With neurogenic claudication.  The patient is comfortable with sitting but has increased pain with standing.  Her back pain is almost always accompanied by the lower extremity pain on the right side.  She has had only short-term relief with transforaminal lumbar epidural steroid injection under fluoroscopic guidance.  We discussed the mechanical nature of her pain.  We discussed that the gabapentin is used mainly for the nerve pain and another  option such as pregabalin is available however is likely to be more sedating.  We also discussed her dosage of ibuprofen and acetaminophen combination.  On her bad days she takes more than advised given her age.  She gives a history of intolerance to tramadol but this was a one-time event after abdominal surgery.  Given her sensitivity to the sedative effect of medications, think that this would deserve a trial.  Have written a prescription for 30 tablets which she can take when pain is not adequately relieved by taking 4 tablets of the acetaminophen and ibuprofen combination. Do not think repeat epidural injections are likely to give much more relief than just a temporary improvement.  We discussed potential for lumbar medial branch blocks however this would not address the lower extremity radicular pain.  I will see her back in 4 weeks.  She plans to follow-up with neurosurgery in early March.  If she decides to undergo surgery I will see her back on a as needed basis

## 2023-06-11 ENCOUNTER — Other Ambulatory Visit: Payer: Self-pay

## 2023-06-11 ENCOUNTER — Ambulatory Visit: Payer: Medicare Other | Attending: Neurosurgery

## 2023-06-11 DIAGNOSIS — M5416 Radiculopathy, lumbar region: Secondary | ICD-10-CM | POA: Insufficient documentation

## 2023-06-11 DIAGNOSIS — M6281 Muscle weakness (generalized): Secondary | ICD-10-CM | POA: Insufficient documentation

## 2023-06-11 DIAGNOSIS — R262 Difficulty in walking, not elsewhere classified: Secondary | ICD-10-CM | POA: Insufficient documentation

## 2023-06-15 ENCOUNTER — Ambulatory Visit: Payer: Medicare Other

## 2023-06-15 VITALS — Ht 61.0 in | Wt 179.0 lb

## 2023-06-15 DIAGNOSIS — Z0000 Encounter for general adult medical examination without abnormal findings: Secondary | ICD-10-CM | POA: Diagnosis not present

## 2023-06-15 NOTE — Progress Notes (Deleted)
Subjective:   Heather [name] is a 73 y.o. female who presents for Medicare Annual (Subsequent) preventive examination.  Visit Complete: Virtual I connected with  Heather [name] on 06/15/23 by a audio enabled telemedicine application and verified that I am speaking with the correct person using two identifiers.  Patient Location: Home  Provider Location: Office/Clinic  I discussed the limitations of evaluation and management by telemedicine. The patient expressed understanding and agreed to proceed.  Vital Signs: Because this visit was a virtual/telehealth visit, some criteria may be missing or patient reported. Any vitals not documented were not able to be obtained and vitals that have been documented are patient reported.  Patient Medicare AWV questionnaire was completed by the patient on 06/14/2023; I have confirmed that all information answered by patient is correct and no changes since this date.        Objective:    Today's Vitals   06/15/23 1257  Height: 5\' 1"  (1.549 m)   Body mass index is 33.82 kg/m.     12/02/2022   10:19 AM 07/03/2022    9:16 AM 09/17/2021   12:00 PM 09/08/2021    7:52 AM 06/27/2021    9:24 AM 06/26/2020    9:24 AM 05/16/2018    8:32 AM  Advanced Directives  Does Patient Have a Medical Advance Directive? Yes Yes No No Yes Yes No  Type of Estate agent of [employer] Power of [place];Living will   Living will Healthcare Power of [place];Living will   Does patient want to make changes to medical advance directive?     No - Patient declined No - Patient declined   Would patient like information on creating a medical advance directive?   No - Patient declined    Yes (Inpatient - patient requests chaplain consult to create a medical advance directive)    Current Medications (verified) Outpatient Encounter Medications as of 06/15/2023  Medication Sig   BD PEN NEEDLE MICRO U/F 32G X 6 MM MISC Inject into the skin as  directed.   Blood Glucose Monitoring Suppl (ONE TOUCH ULTRA 2) w/Device KIT 1 each by Does not apply route 4 (four) times daily. Use device to monitor glucose levels 4 times per day; E11.9   diclofenac Sodium (VOLTAREN) 1 % GEL APPLY 4 GRAMS TO AFFECTED JOINTS 4 TIMES A DAY (Patient taking differently: Apply 4 g topically daily as needed (pain).)   Dulaglutide (TRULICITY) 1.5 MG/0.5ML SOAJ Inject 1.5 mg into the skin once a week.   ezetimibe (ZETIA) 10 MG tablet TAKE 1 TABLET BY MOUTH EVERY DAY   famotidine (PEPCID) 10 MG tablet Take 10 mg by mouth daily as needed for heartburn or indigestion.   gabapentin (NEURONTIN) 100 MG capsule Take 1 capsule (100 mg total) by mouth at bedtime.   gabapentin (NEURONTIN) 600 MG tablet Take 1 tablet (600 mg total) by mouth at bedtime.   glucose blood (ONETOUCH ULTRA) test strip TEST ONCE DAILY   hydrochlorothiazide (HYDRODIURIL) 25 MG tablet Take 1 tablet (25 mg total) by mouth daily.   ibuprofen (ADVIL) 200 MG tablet Take 2 tablets (400 mg total) by mouth every 8 (eight) hours as needed for moderate pain (pain score 4-6).   insulin lispro (HUMALOG KWIKPEN) 100 UNIT/ML KwikPen Using as directed with SS   Inject 4-10 units into the skin 3 times a day before meals prn   < 150  0 units 151-200  2 units 201-250  4 units   >250  8 units   Lancets (ONETOUCH DELICA PLUS LANCET33G) MISC USE TO TEST BLOOD SUGARS ONCE  DAILY   LANTUS SOLOSTAR 100 UNIT/ML Solostar Pen Inject 10 Units into the skin in the morning.   losartan (COZAAR) 100 MG tablet Take 1 tablet (100 mg total) by mouth daily.   ondansetron (ZOFRAN-ODT) 4 MG disintegrating tablet Take 1 tablet (4 mg total) by mouth every 8 (eight) hours as needed for nausea or vomiting. Please schedule an office visit for further refills.   rosuvastatin (CRESTOR) 5 MG tablet Take 1 tablet (5 mg total) by mouth daily.   tiZANidine (ZANAFLEX) 4 MG tablet Take 1 tablet (4 mg total) by mouth every 8 (eight) hours as needed  (back pain, muscle spasms).   traMADol (ULTRAM) 50 MG tablet Take 1 tablet (50 mg total) by mouth every 12 (twelve) hours as needed.   No facility-administered encounter medications on file as of 06/15/2023.    Allergies (verified) Tramadol, Farxiga [dapagliflozin], Flagyl [metronidazole], Jardiance [empagliflozin], Lisinopril, Cholestyramine, Metformin and related, Prandin [repaglinide], and Sulfonamide derivatives   History: Past Medical History:  Diagnosis Date   Arthritis    Colon polyps    Diabetes mellitus without complication (HCC)    Environmental allergies    GERD (gastroesophageal reflux disease)    Heart murmur    since birth never a problem per pt   History of DVT (deep vein thrombosis) 01/16/1969   History of hiatal hernia    History of transfusion    Hyperlipidemia    Hypertension    Interstitial cystitis    Dr Brunilda Payor   PONV (postoperative nausea and vomiting)    Past Surgical History:  Procedure Laterality Date   APPENDECTOMY     BILATERAL SALPINGOOPHORECTOMY     painful cysts   CATARACT EXTRACTION, BILATERAL     COLONOSCOPY  2005   negative; Dr Kinnie Scales. F/U declined   CYSTOSCOPY     X 4; Dr Brunilda Payor   INSERTION OF MESH N/A 09/17/2021   Procedure: INSERTION OF MESH;  Surgeon: Karie Soda, MD;  Location: [facility];  Service: General;  Laterality: N/A;   LAPAROSCOPIC RIGHT COLECTOMY Right 08/19/2015   Procedure: LAPAROSCOPIC ASSISTED  RIGHT COLECTOMY;  Surgeon: Ovidio Kin, MD;  Location: [facility];  Service: General;  Laterality: Right;   LYSIS OF ADHESION N/A 09/17/2021   Procedure: LYSIS OF ADHESION;  Surgeon: Karie Soda, MD;  Location: [facility];  Service: General;  Laterality: N/A;   PREMAGLINANT POLYPS     TOTAL ABDOMINAL HYSTERECTOMY     metromenorrhagia   VENTRAL HERNIA REPAIR N/A 09/17/2021   Procedure: LAPAROSCOPIC VENTRAL WALL HERNIA REPAIR BILATERAL TAP BLOCK;  Surgeon: Karie Soda, MD;  Location: [facility];  Service: General;  Laterality: N/A;   Family History   Problem Relation Age of Onset   Heart attack Mother        in 44s   Hypertension Mother    Stroke Mother         in 50s   Other Father        Deceased, 57   Breast cancer Sister    Healthy Brother    Healthy Daughter    Healthy Son    Stroke Maternal Grandfather 50   Heart attack Paternal Grandfather        in 74s   Diabetes Neg Hx    Social History   Socioeconomic History   Marital status: Widowed    Spouse name: Not on file   Number of children:  2   Years of education: Not on file   Highest education level: Bachelor's degree (e.g., BA, AB, BS)  Occupational History   Not on file  Tobacco Use   Smoking status: Former    Current packs/day: 0.00    Types: Cigarettes    Quit date: 05/18/1984    Years since quitting: 39.1   Smokeless tobacco: Never   Tobacco comments:    smoked 1972-1986, up to 1 ppd  Vaping Use   Vaping status: Never Used  Substance and Sexual Activity   Alcohol use: No    Alcohol/week: 0.0 standard drinks of alcohol   Drug use: No   Sexual activity: Never  Other Topics Concern   Not on file  Social History Narrative   Lives with brother, daughter and 2 grandchildren in a 3 story home.  Has 2 children.  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     Highest level of education:  2 years of graduate school   Social Drivers of Health   Financial Resource Strain: Low Risk  (06/14/2023)   Overall Financial Resource Strain (CARDIA)    Difficulty of Paying Living Expenses: Not very hard  Food Insecurity: No Food Insecurity (06/14/2023)   Hunger Vital Sign    Worried About Running Out of Food in the Last Year: Never true    Ran Out of Food in the Last Year: Never true  Transportation Needs: No Transportation Needs (06/14/2023)   PRAPARE - Administrator, Civil Service (Medical): No    Lack of Transportation (Non-Medical): No  Physical Activity: Inactive (06/14/2023)   Exercise Vital Sign     Days of Exercise per Week: 0 days    Minutes of Exercise per Session: 0 min  Stress: Stress Concern Present (06/14/2023)   [employer] of Occupational Health - Occupational Stress Questionnaire    Feeling of Stress : To some extent  Social Connections: Socially Isolated (06/14/2023)   Social Connection and Isolation Panel [NHANES]    Frequency of Communication with Friends and Family: Twice a week    Frequency of Social Gatherings with Friends and Family: Once a week    Attends Religious Services: Never    Database administrator or Organizations: No    Attends Banker Meetings: Never    Marital Status: Widowed    Tobacco Counseling Counseling given: Not Answered Tobacco comments: smoked 1972-1986, up to 1 ppd   Clinical Intake:                    Information entered by :: Aitana Burry, RMA   Activities of Daily Living    06/14/2023    8:21 AM 07/03/2022    9:23 AM  In your present state of health, do you have any difficulty performing the following activities:  Hearing? 0   Vision? 0   Difficulty concentrating or making decisions? 0   Walking or climbing stairs? 1   Dressing or bathing? 0   Doing errands, shopping? 0   Preparing Food and eating ? N N  Using the Toilet? N N  In the past six months, have you accidently leaked urine? N N  Do you have problems with loss of bowel control? Y N  Managing your Medications? N N  Managing your Finances? N N  Housekeeping or managing your Housekeeping? N N    Patient Care Team: Pincus Sanes, MD as  PCP - General (Internal Medicine) Sharrell Ku, MD as Consulting Physician (Gastroenterology) Romero Belling, MD (Inactive) as Consulting Physician (Endocrinology) Szabat, Vinnie Level, [facility] (Inactive) (Pharmacist) Gelene Mink, OD as Referring Physician (Optometry) Armbruster, Willaim Rayas, MD as Consulting Physician (Gastroenterology) Karie Soda, MD as Consulting Physician (General Surgery)  Indicate  any recent Medical Services you may have received from other than [facility] providers in the past year (date may be approximate).     Assessment:   This is a routine wellness examination for Heather [name].  Hearing/Vision screen Hearing Screening - Comments:: Denies hearing difficulties     Goals Addressed   None   Depression Screen    06/10/2023    1:52 PM 09/09/2022    2:34 PM 07/03/2022    9:18 AM 07/03/2022    9:16 AM 04/29/2022    8:14 AM 10/28/2021    9:57 AM 06/27/2021    9:37 AM  PHQ 2/9 Scores  PHQ - 2 Score 1 0 0 0 0 2 0  PHQ- 9 Score 1    2 8      Fall Risk    06/14/2023    8:21 AM 09/09/2022    2:33 PM 07/03/2022    9:16 AM 04/29/2022    8:05 AM 10/28/2021    9:57 AM  Fall Risk   Falls in the past year? 0 0 0 0 1  Number falls in past yr: 0 0 0 0 0  Injury with Fall? 0 0 0 0 1  Risk for fall due to :  No Fall Risks No Fall Risks No Fall Risks No Fall Risks  Follow up Falls evaluation completed;Falls prevention discussed Falls evaluation completed Falls evaluation completed Falls evaluation completed Falls evaluation completed    MEDICARE RISK AT HOME: Medicare Risk at Home Any stairs in or around the home?: (Patient-Rptd) Yes If so, are there any without handrails?: (Patient-Rptd) No Home free of loose throw rugs in walkways, pet beds, electrical cords, etc?: (Patient-Rptd) Yes Adequate lighting in your home to reduce risk of falls?: (Patient-Rptd) Yes Life alert?: (Patient-Rptd) No Use of a cane, walker or w/c?: (Patient-Rptd) Yes Grab bars in the bathroom?: (Patient-Rptd) No Shower chair or bench in shower?: (Patient-Rptd) No Elevated toilet seat or a handicapped toilet?: (Patient-Rptd) No  TIMED UP AND GO:  Was the test performed?  No    Cognitive Function:    02/16/2017    8:53 AM  MMSE - Mini Mental State Exam  Orientation to time 5  Orientation to Place 5  Registration 3  Attention/ Calculation 5  Recall 2  Language- name 2 objects 2  Language- repeat  1  Language- follow 3 step command 3  Language- read & follow direction 1  Write a sentence 1  Copy design 1  Total score 29        07/03/2022    9:19 AM  6CIT Screen  What Year? 0 points  What month? 0 points  What time? 0 points  Count back from 20 0 points  Months in reverse 0 points  Repeat phrase 0 points  Total Score 0 points    Immunizations Immunization History  Administered Date(s) Administered   Hepatitis A 11/14/2003   Influenza Whole 01/17/2012   Influenza, High Dose Seasonal PF 02/20/2014, 01/15/2018, 12/26/2018, 01/15/2020, 01/17/2021, 02/10/2022, 01/07/2023   Influenza-Unspecified 02/24/2013, 02/19/2015, 01/24/2016, 01/07/2017   PFIZER Comirnaty(Gray Top)Covid-19 Tri-Sucrose Vaccine 08/21/2020, 02/13/2022   PFIZER(Purple Top)SARS-COV-2 Vaccination 06/06/2019, 06/24/2019, 02/13/2020   PNEUMOCOCCAL CONJUGATE-20 03/03/2023   Pfizer  Covid-19 Vaccine Bivalent Booster 27yrs & up 01/23/2021, 09/09/2021   Pneumococcal Conjugate-13 03/07/2014   Pneumococcal Polysaccharide-23 08/02/2015   Respiratory Syncytial Virus Vaccine,Recomb Aduvanted(Arexvy) 03/02/2022   Td 06/11/2006, 04/29/2022   Tdap 02/04/2016   Zoster Recombinant(Shingrix) 09/08/2016, 03/08/2017   Zoster, Live 03/21/2014    TDAP status: Up to date  Flu Vaccine status: Up to date  Pneumococcal vaccine status: Up to date  Covid-19 vaccine status: Completed vaccines  Qualifies for Shingles Vaccine? Yes   Zostavax completed Yes   Shingrix Completed?: Yes  Screening Tests Health Maintenance  Topic Date Due   COVID-19 Vaccine (8 - 2024-25 season) 01/17/2023   FOOT EXAM  10/29/2023   HEMOGLOBIN A1C  10/29/2023   OPHTHALMOLOGY EXAM  12/24/2023   Diabetic kidney evaluation - eGFR measurement  04/29/2024   Diabetic kidney evaluation - Urine ACR  04/29/2024   Medicare Annual Wellness (AWV)  06/14/2024   DEXA SCAN  07/02/2024   MAMMOGRAM  02/24/2025   Colonoscopy  08/17/2027   DTaP/Tdap/Td (4 - Td  or Tdap) 04/29/2032   Pneumonia Vaccine 22+ Years old  Completed   INFLUENZA VACCINE  Completed   Hepatitis C Screening  Completed   Zoster Vaccines- Shingrix  Completed   HPV VACCINES  Aged Out    Health Maintenance  Health Maintenance Due  Topic Date Due   COVID-19 Vaccine (8 - 2024-25 season) 01/17/2023    Colorectal cancer screening: Type of screening: Colonoscopy. Completed 08/16/2017. Repeat every 5 years  Mammogram status: Completed 02/25/2023. Repeat every year  Bone Density status: Completed 07/02/2021. Results reflect: Bone density results: OSTEOPENIA. Repeat every 2 years.  Lung Cancer Screening: (Low Dose CT Chest recommended if Age 24-80 years, 20 pack-year currently smoking OR have quit w/in 15years.) does not qualify.   Lung Cancer Screening Referral: N/A  Additional Screening:  Hepatitis C Screening: does qualify; Completed 10/29/2022  Vision Screening: Recommended annual ophthalmology exams for early detection of glaucoma and other disorders of the eye. Is the patient up to date with their annual eye exam?  Yes  Who is the provider or what is the name of the office in which the patient attends annual eye exams? Dr. Sharlot Gowda If pt is not established with a provider, would they like to be referred to a provider to establish care? No .   Dental Screening: Recommended annual dental exams for proper oral hygiene  Diabetic Foot Exam: Diabetic Foot Exam: Completed 10/29/2022  Community Resource Referral / Chronic Care Management: CRR required this visit?  No   CCM required this visit?  No     Plan:     I have personally reviewed and noted the following in the patient's chart:   Medical and social history Use of alcohol, tobacco or illicit drugs  Current medications and supplements including opioid prescriptions. Patient is not currently taking opioid prescriptions. Functional ability and status Nutritional status Physical activity Advanced directives List  of other physicians Hospitalizations, surgeries, and ER visits in previous 12 months Vitals Screenings to include cognitive, depression, and falls Referrals and appointments  In addition, I have reviewed and discussed with patient certain preventive protocols, quality metrics, and best practice recommendations. A written personalized care plan for preventive services as well as general preventive health recommendations were provided to patient.     Melba Araki L Quintin Hjort, CMA   06/15/2023   After Visit Summary: (MyChart) Due to this being a telephonic visit, the after visit summary with patients personalized plan was offered to patient via  MyChart   Nurse Notes: Patient is due for a colonoscopy, however she will like to wait on placing the order for now.  She is up to date with all other health maintenance with no concerns to address today.

## 2023-06-15 NOTE — Patient Instructions (Addendum)
Heather [name] , Thank you for taking time to come for your Medicare Wellness Visit. I appreciate your ongoing commitment to your health goals. Please review the following plan we discussed and let me know if I can assist you in the future.   Referrals/Orders/Follow-Ups/Clinician Recommendations: It was nice talking to you today.  You are due for a colonoscopy.  Each day, aim for 6 glasses of water, plenty of protein in your diet and try to get up and walk/ stretch every hour for 5-10 minutes at a time.    This is a list of the screening recommended for you and due dates:  Health Maintenance  Topic Date Due   COVID-19 Vaccine (8 - 2024-25 season) 01/17/2023   Complete foot exam   10/29/2023   Hemoglobin A1C  10/29/2023   Eye exam for diabetics  12/24/2023   Yearly kidney function blood test for diabetes  04/29/2024   Yearly kidney health urinalysis for diabetes  04/29/2024   Medicare Annual Wellness Visit  06/14/2024   DEXA scan (bone density measurement)  07/02/2024   Mammogram  02/24/2025   Colon Cancer Screening  08/17/2027   DTaP/Tdap/Td vaccine (4 - Td or Tdap) 04/29/2032   Pneumonia Vaccine  Completed   Flu Shot  Completed   Hepatitis C Screening  Completed   Zoster (Shingles) Vaccine  Completed   HPV Vaccine  Aged Out    Advanced directives: (In Chart) A copy of your advanced directives are scanned into your chart should your provider ever need it.  Next Medicare Annual Wellness Visit scheduled for next year: Yes

## 2023-06-16 ENCOUNTER — Ambulatory Visit: Payer: Medicare Other | Admitting: Physical Therapy

## 2023-06-16 ENCOUNTER — Encounter: Payer: Self-pay | Admitting: Physical Therapy

## 2023-06-16 DIAGNOSIS — R262 Difficulty in walking, not elsewhere classified: Secondary | ICD-10-CM

## 2023-06-16 DIAGNOSIS — M6281 Muscle weakness (generalized): Secondary | ICD-10-CM | POA: Diagnosis not present

## 2023-06-16 DIAGNOSIS — M5416 Radiculopathy, lumbar region: Secondary | ICD-10-CM | POA: Diagnosis not present

## 2023-06-16 NOTE — Therapy (Signed)
OUTPATIENT PHYSICAL THERAPY THORACOLUMBAR EVALUATION   Patient Name: Heather [name] MRN: [id number] DOB:[birth year], 73 y.o., female Today's Date: 06/16/2023  END OF SESSION:  PT End of Session - 06/16/23 0827     Visit Number 2    Number of Visits 6    Date for PT Re-Evaluation 08/09/23    PT Start Time 0830    PT Stop Time 0910    PT Time Calculation (min) 40 min    Activity Tolerance Patient limited by pain    Behavior During Therapy [facility] for tasks assessed/performed              Past Medical History:  Diagnosis Date   Arthritis    Colon polyps    Diabetes mellitus without complication (HCC)    Environmental allergies    GERD (gastroesophageal reflux disease)    Heart murmur    since birth never a problem per pt   History of DVT (deep vein thrombosis) 01/16/1969   History of hiatal hernia    History of transfusion    Hyperlipidemia    Hypertension    Interstitial cystitis    Dr Brunilda Payor   PONV (postoperative nausea and vomiting)    Past Surgical History:  Procedure Laterality Date   APPENDECTOMY     BILATERAL SALPINGOOPHORECTOMY     painful cysts   CATARACT EXTRACTION, BILATERAL     COLONOSCOPY  2005   negative; Dr Kinnie Scales. F/U declined   CYSTOSCOPY     X 4; Dr Brunilda Payor   INSERTION OF MESH N/A 09/17/2021   Procedure: INSERTION OF MESH;  Surgeon: Karie Soda, MD;  Location: [facility];  Service: General;  Laterality: N/A;   LAPAROSCOPIC RIGHT COLECTOMY Right 08/19/2015   Procedure: LAPAROSCOPIC ASSISTED  RIGHT COLECTOMY;  Surgeon: Ovidio Kin, MD;  Location: [facility];  Service: General;  Laterality: Right;   LYSIS OF ADHESION N/A 09/17/2021   Procedure: LYSIS OF ADHESION;  Surgeon: Karie Soda, MD;  Location: [facility];  Service: General;  Laterality: N/A;   PREMAGLINANT POLYPS     TOTAL ABDOMINAL HYSTERECTOMY     metromenorrhagia   VENTRAL HERNIA REPAIR N/A 09/17/2021   Procedure: LAPAROSCOPIC VENTRAL WALL HERNIA REPAIR BILATERAL TAP BLOCK;  Surgeon: Karie Soda, MD;   Location: [facility];  Service: General;  Laterality: N/A;   Patient Active Problem List   Diagnosis Date Noted   Grade II diastolic dysfunction 06/10/2023   Mitral regurgitation 06/10/2023   Aortic stenosis 06/10/2023   Tricuspid regurgitation 06/10/2023   Mild ascending aorta dilation (HCC) 06/10/2023   Murmur, cardiac 04/30/2023   Incarcerated incisional hernia s/p lap repair w mesh 09/17/2021 09/17/2021   Osteopenia 07/05/2021   Lumbar back pain with radiculopathy affecting right lower extremity 03/03/2021   Fall 03/03/2021   Aortic atherosclerosis (HCC) 10/25/2020   GERD (gastroesophageal reflux disease) 04/26/2020   Vitamin D deficiency 04/25/2020   Arthralgia 10/26/2019   Dry cough 11/22/2017   Nocturnal leg cramps 02/02/2017   DOE (dyspnea on exertion) 08/04/2016   Obesity (BMI 30.0-34.9) 08/04/2016   Generalized osteoarthritis of hand 02/04/2016   Diabetic polyneuropathy associated with diabetes mellitus due to underlying condition (HCC) 11/27/2015   Hiatal hernia, large 08/02/2015   Colon polyps 08/02/2015   Iron deficiency 04/21/2015   Diabetes type 2, controlled (HCC) 05/03/2014   Hyperlipidemia, mixed 03/18/2014   Interstitial cystitis 07/08/2012   Essential hypertension 02/10/2011    PCP: Pincus Sanes, MD   REFERRING PROVIDER: Tressie Stalker, MD  REFERRING DIAG: [phone] (  ICD-10-CM) - Spondylolisthesis of lumbar region  Rationale for Evaluation and Treatment: Rehabilitation  THERAPY DIAG:  Radiculopathy, lumbar region  Muscle weakness (generalized)  Difficulty in walking, not elsewhere classified  ONSET DATE: chronic exacerbation in 11/2022  SUBJECTIVE:                                                                                                                                                                                           SUBJECTIVE STATEMENT: Pt. Continues to have pain, and is highly hesitant to movement. Pt reiterates that she is only here  to fulfill pre-surgical requirements for insurance. Although she mentioned she called and her insurance said she did not have any kind of fulfillment regarding therapy.   PERTINENT HISTORY:    PAIN:  Are you having pain? Yes: NPRS scale: 9/10 at worst Pain location: low back/ RLE Pain description: ache, sharp, burning Aggravating factors: random activity and extension biased positions Relieving factors: sitting  PRECAUTIONS: None  RED FLAGS: None   WEIGHT BEARING RESTRICTIONS: No  FALLS:  Has patient fallen in last 6 months? No   OCCUPATION: retired  PLOF: Independent  PATIENT GOALS: To avoid surgery  NEXT MD VISIT: TBD  OBJECTIVE:  Note: Objective measures were completed at Evaluation unless otherwise noted.  DIAGNOSTIC FINDINGS:    PATIENT SURVEYS:  FOTO 39(52 predicted)  MUSCLE LENGTH: Hamstrings: WFL measured in sitting   POSTURE: decreased lumbar lordosis  PALPATION: deferred  LUMBAR ROM: deferred due to patient apprehension regarding exacerbation of symptoms  AROM eval  Flexion   Extension   Right lateral flexion   Left lateral flexion   Right rotation   Left rotation    (Blank rows = not tested)  LOWER EXTREMITY ROM:   WFL for gait and transfers  Active  Right eval Left eval  Hip flexion    Hip extension    Hip abduction    Hip adduction    Hip internal rotation    Hip external rotation    Knee flexion    Knee extension    Ankle dorsiflexion    Ankle plantarflexion    Ankle inversion    Ankle eversion     (Blank rows = not tested)  LOWER EXTREMITY MMT:    MMT Right eval Left eval  Hip flexion    Hip extension    Hip abduction    Hip adduction    Hip internal rotation    Hip external rotation    Knee flexion    Knee extension    Ankle dorsiflexion    Ankle plantarflexion    Ankle inversion    Ankle  eversion     (Blank rows = not tested)  LUMBAR SPECIAL TESTS:  Straight leg raise test: Negative and Slump test:  Positive  FUNCTIONAL TESTS:  30 seconds chair stand test 1 reps  GAIT: Distance walked: 24ft x2 Assistive device utilized: Single point cane Level of assistance: Modified independence Comments: slow cadence with antalgic gait  [facility] Adult PT Treatment:                                                DATE: 06/16/2023  Therapeutic Exercise: Nu step 8' EOB marching 2x10 Elevated Dead bug2x8 Seated lat activation 2x10 w/5s holds Therapeutic activity POC discussion  TREATMENT DATE:  06/11/23 Eval and HEP                                                                                                                         PATIENT EDUCATION:  Education details: Discussed eval findings, rehab rationale and POC and patient is in agreement  Person educated: Patient Education method: Explanation Education comprehension: verbalized understanding and needs further education  HOME EXERCISE PROGRAM: Access Code: [email or web address] URL: [email or web address] Date: 06/11/2023 Prepared by: Gustavus Bryant  Exercises - Seated Shoulder Horizontal Abduction with Resistance  - 1 x daily - 5 x weekly - 2 sets - 10 reps - Seated Elbow Flexion with Resistance  - 1 x daily - 5 x weekly - 2 sets - 10 reps - Seated Chest Press with Resistance Band  - 1 x daily - 5 x weekly - 2 sets - 10 reps  ASSESSMENT:  CLINICAL IMPRESSION: Pt attended physical therapy session for continuation of treatment regarding unstable low back pain. Pt showed  poor tolerance to treatment and demonstrated improvement regarding understanding of current prognosis, treatment rational, and therapeutic versus surgical options.Difficulties continued with apprehension to movement. Pt required moderate vocal/tactile cues for safe and appropriate performance of today's activities. Recommend avoiding lumbar extension due to presence of spondylosis in lumbar spine.Pt was educated to follow up with insurance beenfits and surgical team  regarding her surgical options and priorities.   Eval impression: Patient is a 73 y.o. female who was seen today for physical therapy evaluation and treatment for Chronic low back and RLE radiculopathy due to underlying degenerative changes including spondylolisthesis and nerve root impingement.  Patient very apprehensive towards movement as symptoms occur randomly and are debilitating once aggravated.   OPPT will focus on seated core strengthening as patient considers surgical intervention.  OBJECTIVE IMPAIRMENTS: Abnormal gait, decreased activity tolerance, decreased endurance, decreased mobility, difficulty walking, decreased ROM, decreased strength, increased muscle spasms, improper body mechanics, postural dysfunction, obesity, and pain.   ACTIVITY LIMITATIONS: carrying, lifting, bending, sitting, and standing  PERSONAL FACTORS: Age, Education, Fitness, Past/current experiences, and Time since onset of injury/illness/exacerbation are also affecting patient's functional outcome.   REHAB POTENTIAL: Fair based on extent  of degenerative changes and need for surgery  as well as unsuccessful conservative treatment  CLINICAL DECISION MAKING: Stable/uncomplicated  EVALUATION COMPLEXITY: Low   GOALS: Goals reviewed with patient? No  SHORT TERM GOALS=LONG TERM GOALS: Target date: 08/09/23  Patient to demonstrate independence in HEP  Baseline: [id number] Goal status: INITIAL  2.  Patient will score at least 52% on FOTO to signify clinically meaningful improvement in functional abilities.   Baseline: 39% Goal status: INITIAL  3.  Patient will acknowledge 6/10 pain at least once during episode of care   Baseline: 9/10 Goal status: INITIAL  4.  Patient will increase 30s chair stand reps from 1 to 2 with/without arms to demonstrate and improved functional ability with less pain/difficulty as well as reduce fall risk.  Baseline: 1 Goal status: INITIAL     PLAN:  PT FREQUENCY:  1x/week  PT DURATION: 6 weeks  PLANNED INTERVENTIONS: 97164- PT Re-evaluation, 97110-Therapeutic exercises, 97530- Therapeutic activity, [id number]- Neuromuscular re-education, 97535- Self Care, [id number]- Manual therapy, [id number]- Gait training, [phone]- Aquatic Therapy, Stair training, and Dry Needling.  PLAN FOR NEXT SESSION: HEP review and update, manual techniques as appropriate, aerobic tasks, ROM and flexibility activities, strengthening and PREs, TPDN, gait and balance training as needed     Luis Abed, PT 06/16/2023, 9:30 AM

## 2023-06-18 ENCOUNTER — Other Ambulatory Visit: Payer: Self-pay | Admitting: Neurosurgery

## 2023-06-22 NOTE — Progress Notes (Signed)
Subjective:   Heather [name] is a 73 y.o. female who presents for Medicare Annual (Subsequent) preventive examination.  Visit Complete: Virtual I connected with  Heather [name] on 06/22/23 by a audio enabled telemedicine application and verified that I am speaking with the correct person using two identifiers.  Patient Location: Home  Provider Location: Office/Clinic  I discussed the limitations of evaluation and management by telemedicine. The patient expressed understanding and agreed to proceed.  Vital Signs: Because this visit was a virtual/telehealth visit, some criteria may be missing or patient reported. Any vitals not documented were not able to be obtained and vitals that have been documented are patient reported.  Patient Medicare AWV questionnaire was completed by the patient on 06/14/2023; I have confirmed that all information answered by patient is correct and no changes since this date.  PHQ9: No chang from last one 06/10/23  Cardiac Risk Factors include: advanced age (>83men, >77 women);diabetes mellitus     Objective:    Today's Vitals   06/14/23 0821 06/15/23 1257  Weight:  179 lb (81.2 kg)  Height:  5\' 1"  (1.549 m)  PainSc: 6     Body mass index is 33.82 kg/m.     06/15/2023    1:52 PM 12/02/2022   10:19 AM 07/03/2022    9:16 AM 09/17/2021   12:00 PM 09/08/2021    7:52 AM 06/27/2021    9:24 AM 06/26/2020    9:24 AM  Advanced Directives  Does Patient Have a Medical Advance Directive? Yes Yes Yes No No Yes Yes  Type of Estate agent of [place];Living will Healthcare Power of [employer] of [place];Living will   Living will Healthcare Power of [place];Living will  Does patient want to make changes to medical advance directive? No - Patient declined     No - Patient declined No - Patient declined  Copy of Healthcare Power of Attorney in Chart? Yes - validated most recent copy scanned in chart (See row information)         Would patient like information on creating a medical advance directive?    No - Patient declined       Current Medications (verified) Outpatient Encounter Medications as of 06/15/2023  Medication Sig   BD PEN NEEDLE MICRO U/F 32G X 6 MM MISC Inject into the skin as directed.   Blood Glucose Monitoring Suppl (ONE TOUCH ULTRA 2) w/Device KIT 1 each by Does not apply route 4 (four) times daily. Use device to monitor glucose levels 4 times per day; E11.9   diclofenac Sodium (VOLTAREN) 1 % GEL APPLY 4 GRAMS TO AFFECTED JOINTS 4 TIMES A DAY (Patient taking differently: Apply 4 g topically daily as needed (pain).)   Dulaglutide (TRULICITY) 1.5 MG/0.5ML SOAJ Inject 1.5 mg into the skin once a week.   famotidine (PEPCID) 10 MG tablet Take 10 mg by mouth daily as needed for heartburn or indigestion.   gabapentin (NEURONTIN) 100 MG capsule Take 1 capsule (100 mg total) by mouth at bedtime.   gabapentin (NEURONTIN) 600 MG tablet Take 1 tablet (600 mg total) by mouth at bedtime.   glucose blood (ONETOUCH ULTRA) test strip TEST ONCE DAILY   hydrochlorothiazide (HYDRODIURIL) 25 MG tablet Take 1 tablet (25 mg total) by mouth daily.   ibuprofen (ADVIL) 200 MG tablet Take 2 tablets (400 mg total) by mouth every 8 (eight) hours as needed for moderate pain (pain score 4-6).   insulin lispro (HUMALOG KWIKPEN) 100 UNIT/ML  KwikPen Using as directed with SS   Inject 4-10 units into the skin 3 times a day before meals prn   < 150  0 units 151-200  2 units 201-250  4 units   >250      8 units   Lancets (ONETOUCH DELICA PLUS LANCET33G) MISC USE TO TEST BLOOD SUGARS ONCE  DAILY   LANTUS SOLOSTAR 100 UNIT/ML Solostar Pen Inject 10 Units into the skin in the morning.   losartan (COZAAR) 100 MG tablet Take 1 tablet (100 mg total) by mouth daily.   ondansetron (ZOFRAN-ODT) 4 MG disintegrating tablet Take 1 tablet (4 mg total) by mouth every 8 (eight) hours as needed for nausea or vomiting. Please schedule an office visit for  further refills.   rosuvastatin (CRESTOR) 5 MG tablet Take 1 tablet (5 mg total) by mouth daily.   tiZANidine (ZANAFLEX) 4 MG tablet Take 1 tablet (4 mg total) by mouth every 8 (eight) hours as needed (back pain, muscle spasms).   traMADol (ULTRAM) 50 MG tablet Take 1 tablet (50 mg total) by mouth every 12 (twelve) hours as needed.   ezetimibe (ZETIA) 10 MG tablet TAKE 1 TABLET BY MOUTH EVERY DAY   No facility-administered encounter medications on file as of 06/15/2023.    Allergies (verified) Tramadol, Farxiga [dapagliflozin], Flagyl [metronidazole], Jardiance [empagliflozin], Lisinopril, Cholestyramine, Metformin and related, Prandin [repaglinide], and Sulfonamide derivatives   History: Past Medical History:  Diagnosis Date   Arthritis    Colon polyps    Diabetes mellitus without complication (HCC)    Environmental allergies    GERD (gastroesophageal reflux disease)    Heart murmur    since birth never a problem per pt   History of DVT (deep vein thrombosis) 01/16/1969   History of hiatal hernia    History of transfusion    Hyperlipidemia    Hypertension    Interstitial cystitis    Dr Brunilda Payor   PONV (postoperative nausea and vomiting)    Past Surgical History:  Procedure Laterality Date   APPENDECTOMY     BILATERAL SALPINGOOPHORECTOMY     painful cysts   CATARACT EXTRACTION, BILATERAL     COLONOSCOPY  2005   negative; Dr Kinnie Scales. F/U declined   CYSTOSCOPY     X 4; Dr Brunilda Payor   INSERTION OF MESH N/A 09/17/2021   Procedure: INSERTION OF MESH;  Surgeon: Karie Soda, MD;  Location: [facility];  Service: General;  Laterality: N/A;   LAPAROSCOPIC RIGHT COLECTOMY Right 08/19/2015   Procedure: LAPAROSCOPIC ASSISTED  RIGHT COLECTOMY;  Surgeon: Ovidio Kin, MD;  Location: [facility];  Service: General;  Laterality: Right;   LYSIS OF ADHESION N/A 09/17/2021   Procedure: LYSIS OF ADHESION;  Surgeon: Karie Soda, MD;  Location: [facility];  Service: General;  Laterality: N/A;   PREMAGLINANT POLYPS      TOTAL ABDOMINAL HYSTERECTOMY     metromenorrhagia   VENTRAL HERNIA REPAIR N/A 09/17/2021   Procedure: LAPAROSCOPIC VENTRAL WALL HERNIA REPAIR BILATERAL TAP BLOCK;  Surgeon: Karie Soda, MD;  Location: [facility];  Service: General;  Laterality: N/A;   Family History  Problem Relation Age of Onset   Heart attack Mother        in 91s   Hypertension Mother    Stroke Mother         in 35s   Other Father        Deceased, 33   Breast cancer Sister    Healthy Brother    Healthy Daughter  Healthy Son    Stroke Maternal Grandfather 50   Heart attack Paternal Grandfather        in 36s   Diabetes Neg Hx    Social History   Socioeconomic History   Marital status: Widowed    Spouse name: Not on file   Number of children: 2   Years of education: Not on file   Highest education level: Bachelor's degree (e.g., BA, AB, BS)  Occupational History   Not on file  Tobacco Use   Smoking status: Former    Current packs/day: 0.00    Types: Cigarettes    Quit date: 05/18/1984    Years since quitting: 39.1   Smokeless tobacco: Never   Tobacco comments:    smoked 1972-1986, up to 1 ppd  Vaping Use   Vaping status: Never Used  Substance and Sexual Activity   Alcohol use: No    Alcohol/week: 0.0 standard drinks of alcohol   Drug use: No   Sexual activity: Never  Other Topics Concern   Not on file  Social History Narrative   Lives with brother, daughter and 2 grandchildren in a 3 story home.  Has 2 children.  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     Highest level of education:  2 years of graduate school   Social Drivers of Health   Financial Resource Strain: Low Risk  (06/14/2023)   Overall Financial Resource Strain (CARDIA)    Difficulty of Paying Living Expenses: Not very hard  Food Insecurity: No Food Insecurity (06/14/2023)   Hunger Vital Sign    Worried About Running Out of Food in the Last Year: Never true    Ran Out of Food in  the Last Year: Never true  Transportation Needs: No Transportation Needs (06/14/2023)   PRAPARE - Administrator, Civil Service (Medical): No    Lack of Transportation (Non-Medical): No  Physical Activity: Inactive (06/14/2023)   Exercise Vital Sign    Days of Exercise per Week: 0 days    Minutes of Exercise per Session: 0 min  Stress: Stress Concern Present (06/14/2023)   [employer] of Occupational Health - Occupational Stress Questionnaire    Feeling of Stress : To some extent  Social Connections: Socially Isolated (06/14/2023)   Social Connection and Isolation Panel [NHANES]    Frequency of Communication with Friends and Family: Twice a week    Frequency of Social Gatherings with Friends and Family: Once a week    Attends Religious Services: Never    Database administrator or Organizations: No    Attends Banker Meetings: Never    Marital Status: Widowed    Tobacco Counseling Counseling given: Not Answered Tobacco comments: smoked 1972-1986, up to 1 ppd   Clinical Intake:  Pre-visit preparation completed: Yes  Pain : 0-10 Pain Score: 6  Pain Type: Chronic pain Pain Location: Back Pain Descriptors / Indicators: Aching, Discomfort Pain Onset: More than a month ago Pain Frequency: Constant     BMI - recorded: 33.82 Nutritional Status: BMI > 30  Obese Nutritional Risks: Nausea/ vomitting/ diarrhea (nausea) Diabetes: Yes CBG done?: Yes (135) CBG resulted in Enter/ Edit results?: No Did pt. bring in CBG monitor from home?: No  How often do you need to have someone help you when you read instructions, pamphlets, or other written materials from your doctor or pharmacy?: 1 - Never     Information  entered by :: Uilani Sanville, RMA   Activities of Daily Living    06/14/2023    8:21 AM 07/03/2022    9:23 AM  In your present state of health, do you have any difficulty performing the following activities:  Hearing? 0   Vision? 0    Difficulty concentrating or making decisions? 0   Walking or climbing stairs? 1   Dressing or bathing? 0   Doing errands, shopping? 0   Preparing Food and eating ? N N  Using the Toilet? N N  In the past six months, have you accidently leaked urine? N N  Do you have problems with loss of bowel control? Y N  Managing your Medications? N N  Managing your Finances? N N  Housekeeping or managing your Housekeeping? N N    Patient Care Team: Pincus Sanes, MD as PCP - General (Internal Medicine) Sharrell Ku, MD as Consulting Physician (Gastroenterology) Romero Belling, MD (Inactive) as Consulting Physician (Endocrinology) Szabat, Vinnie Level, [facility] (Inactive) (Pharmacist) Gelene Mink, OD as Referring Physician (Optometry) Armbruster, Willaim Rayas, MD as Consulting Physician (Gastroenterology) Karie Soda, MD as Consulting Physician (General Surgery)  Indicate any recent Medical Services you may have received from other than [facility] providers in the past year (date may be approximate).     Assessment:   This is a routine wellness examination for Heather [name].  Hearing/Vision screen Hearing Screening - Comments:: Denies hearing difficulties   Vision Screening - Comments:: Denies vision issues.    Goals Addressed   None   Depression Screen    06/10/2023    1:52 PM 09/09/2022    2:34 PM 07/03/2022    9:18 AM 07/03/2022    9:16 AM 04/29/2022    8:14 AM 10/28/2021    9:57 AM 06/27/2021    9:37 AM  PHQ 2/9 Scores  PHQ - 2 Score 1 0 0 0 0 2 0  PHQ- 9 Score 1    2 8      Fall Risk    06/14/2023    8:21 AM 09/09/2022    2:33 PM 07/03/2022    9:16 AM 04/29/2022    8:05 AM 10/28/2021    9:57 AM  Fall Risk   Falls in the past year? 0 0 0 0 1  Number falls in past yr: 0 0 0 0 0  Injury with Fall? 0 0 0 0 1  Risk for fall due to :  No Fall Risks No Fall Risks No Fall Risks No Fall Risks  Follow up Falls evaluation completed;Falls prevention discussed Falls evaluation completed Falls  evaluation completed Falls evaluation completed Falls evaluation completed    MEDICARE RISK AT HOME: Medicare Risk at Home Any stairs in or around the home?: (Patient-Rptd) Yes If so, are there any without handrails?: (Patient-Rptd) No Home free of loose throw rugs in walkways, pet beds, electrical cords, etc?: (Patient-Rptd) Yes Adequate lighting in your home to reduce risk of falls?: (Patient-Rptd) Yes Life alert?: (Patient-Rptd) No Use of a cane, walker or w/c?: (Patient-Rptd) Yes Grab bars in the bathroom?: (Patient-Rptd) No Shower chair or bench in shower?: (Patient-Rptd) No Elevated toilet seat or a handicapped toilet?: (Patient-Rptd) No  TIMED UP AND GO:  Was the test performed?  No    Cognitive Function:    02/16/2017    8:53 AM  MMSE - Mini Mental State Exam  Orientation to time 5  Orientation to Place 5  Registration 3  Attention/ Calculation 5  Recall 2  Language- name 2 objects 2  Language- repeat 1  Language- follow 3 step command 3  Language- read & follow direction 1  Write a sentence 1  Copy design 1  Total score 29        06/15/2023    1:42 PM 07/03/2022    9:19 AM  6CIT Screen  What Year? 0 points 0 points  What month? 0 points 0 points  What time? 0 points 0 points  Count back from 20 0 points 0 points  Months in reverse 0 points 0 points  Repeat phrase 0 points 0 points  Total Score 0 points 0 points    Immunizations Immunization History  Administered Date(s) Administered   Hepatitis A 11/14/2003   Influenza Whole 01/17/2012   Influenza, High Dose Seasonal PF 02/20/2014, 01/15/2018, 12/26/2018, 01/15/2020, 01/17/2021, 02/10/2022, 01/07/2023   Influenza-Unspecified 02/24/2013, 02/19/2015, 01/24/2016, 01/07/2017   PFIZER Comirnaty(Gray Top)Covid-19 Tri-Sucrose Vaccine 08/21/2020, 02/13/2022   PFIZER(Purple Top)SARS-COV-2 Vaccination 06/06/2019, 06/24/2019, 02/13/2020   PNEUMOCOCCAL CONJUGATE-20 03/03/2023   Pfizer Covid-19 Vaccine Bivalent  Booster 45yrs & up 01/23/2021, 09/09/2021   Pneumococcal Conjugate-13 03/07/2014   Pneumococcal Polysaccharide-23 08/02/2015   Respiratory Syncytial Virus Vaccine,Recomb Aduvanted(Arexvy) 03/02/2022   Td 06/11/2006, 04/29/2022   Tdap 02/04/2016   Zoster Recombinant(Shingrix) 09/08/2016, 03/08/2017   Zoster, Live 03/21/2014    TDAP status: Up to date  Flu Vaccine status: Up to date  Pneumococcal vaccine status: Up to date  Covid-19 vaccine status: Completed vaccines  Qualifies for Shingles Vaccine? Yes   Zostavax completed Yes   Shingrix Completed?: Yes  Screening Tests Health Maintenance  Topic Date Due   COVID-19 Vaccine (8 - 2024-25 season) 01/17/2023   FOOT EXAM  10/29/2023   HEMOGLOBIN A1C  10/29/2023   OPHTHALMOLOGY EXAM  12/24/2023   Diabetic kidney evaluation - eGFR measurement  04/29/2024   Diabetic kidney evaluation - Urine ACR  04/29/2024   Medicare Annual Wellness (AWV)  06/14/2024   DEXA SCAN  07/02/2024   MAMMOGRAM  02/24/2025   Colonoscopy  08/17/2027   DTaP/Tdap/Td (4 - Td or Tdap) 04/29/2032   Pneumonia Vaccine 97+ Years old  Completed   INFLUENZA VACCINE  Completed   Hepatitis C Screening  Completed   Zoster Vaccines- Shingrix  Completed   HPV VACCINES  Aged Out    Health Maintenance  Health Maintenance Due  Topic Date Due   COVID-19 Vaccine (8 - 2024-25 season) 01/17/2023    Colorectal cancer screening: Type of screening: Colonoscopy. Completed 08/16/2017. Repeat every 5 years  Mammogram status: Completed 02/25/2023. Repeat every year  Bone Density status: Completed 07/02/2021. Results reflect: Bone density results: OSTEOPENIA. Repeat every 2 years.  Lung Cancer Screening: (Low Dose CT Chest recommended if Age 37-80 years, 20 pack-year currently smoking OR have quit w/in 15years.) does not qualify.   Lung Cancer Screening Referral: N/A  Additional Screening:  Hepatitis C Screening: does qualify; Completed 10/29/2022  Vision Screening:  Recommended annual ophthalmology exams for early detection of glaucoma and other disorders of the eye. Is the patient up to date with their annual eye exam?  Yes  Who is the provider or what is the name of the office in which the patient attends annual eye exams? Dr. Sharlot Gowda If pt is not established with a provider, would they like to be referred to a provider to establish care? No .   Dental Screening: Recommended annual dental exams for proper oral hygiene  Diabetic Foot Exam: Diabetic Foot Exam: Completed 10/29/2022  Community Resource Referral /  Chronic Care Management: CRR required this visit?  No   CCM required this visit?  No     Plan:     I have personally reviewed and noted the following in the patient's chart:   Medical and social history Use of alcohol, tobacco or illicit drugs  Current medications and supplements including opioid prescriptions. Patient is not currently taking opioid prescriptions. Functional ability and status Nutritional status Physical activity Advanced directives List of other physicians Hospitalizations, surgeries, and ER visits in previous 12 months Vitals Screenings to include cognitive, depression, and falls Referrals and appointments  In addition, I have reviewed and discussed with patient certain preventive protocols, quality metrics, and best practice recommendations. A written personalized care plan for preventive services as well as general preventive health recommendations were provided to patient.     Tomisha Reppucci L Sedra Morfin, CMA   06/15/2023  After Visit Summary: (MyChart) Due to this being a telephonic visit, the after visit summary with patients personalized plan was offered to patient via MyChart   Nurse Notes: Patient is due for a colonoscopy, however she will like to wait on placing the order for now.  She is up to date with all other health maintenance with no concerns to address today.

## 2023-06-23 ENCOUNTER — Ambulatory Visit: Payer: Medicare Other | Attending: Neurosurgery | Admitting: Physical Therapy

## 2023-06-23 DIAGNOSIS — R262 Difficulty in walking, not elsewhere classified: Secondary | ICD-10-CM | POA: Insufficient documentation

## 2023-06-23 DIAGNOSIS — M6281 Muscle weakness (generalized): Secondary | ICD-10-CM | POA: Diagnosis not present

## 2023-06-23 DIAGNOSIS — M5416 Radiculopathy, lumbar region: Secondary | ICD-10-CM | POA: Insufficient documentation

## 2023-06-23 NOTE — Therapy (Signed)
 OUTPATIENT PHYSICAL THERAPY THORACOLUMBAR EVALUATION   Patient Name: Heather [name] MRN: [id number] DOB:1950/05/23, 73 y.o., female Today's Date: 06/23/2023  END OF SESSION:  PT End of Session - 06/23/23 1038     Visit Number 3    Number of Visits 6    Date for PT Re-Evaluation 08/09/23    PT Start Time 1040    PT Stop Time 1058    PT Time Calculation (min) 18 min           PHYSICAL THERAPY DISCHARGE SUMMARY  Visits from Start of Care: 4  Current functional level related to goals / functional outcomes: No goals met or progress shown   Remaining deficits: See assessment   Education / Equipment: See objective   Patient agrees to discharge. Patient goals were not met. Patient is being discharged due to  Pt has scheduled surgery and is not currently appropriate for PT services, detailed in assessment.     Past Medical History:  Diagnosis Date   Arthritis    Colon polyps    Diabetes mellitus without complication (HCC)    Environmental allergies    GERD (gastroesophageal reflux disease)    Heart murmur    since birth never a problem per pt   History of DVT (deep vein thrombosis) 01/16/1969   History of hiatal hernia    History of transfusion    Hyperlipidemia    Hypertension    Interstitial cystitis    Dr Aleene   PONV (postoperative nausea and vomiting)    Past Surgical History:  Procedure Laterality Date   APPENDECTOMY     BILATERAL SALPINGOOPHORECTOMY     painful cysts   CATARACT EXTRACTION, BILATERAL     COLONOSCOPY  2005   negative; Dr Luis. F/U declined   CYSTOSCOPY     X 4; Dr Aleene   INSERTION OF MESH N/A 09/17/2021   Procedure: INSERTION OF MESH;  Surgeon: Sheldon Standing, MD;  Location: [facility];  Service: General;  Laterality: N/A;   LAPAROSCOPIC RIGHT COLECTOMY Right 08/19/2015   Procedure: LAPAROSCOPIC ASSISTED  RIGHT COLECTOMY;  Surgeon: Alm Angle, MD;  Location: [facility];  Service: General;  Laterality: Right;   LYSIS OF ADHESION N/A 09/17/2021    Procedure: LYSIS OF ADHESION;  Surgeon: Sheldon Standing, MD;  Location: [facility];  Service: General;  Laterality: N/A;   PREMAGLINANT POLYPS     TOTAL ABDOMINAL HYSTERECTOMY     metromenorrhagia   VENTRAL HERNIA REPAIR N/A 09/17/2021   Procedure: LAPAROSCOPIC VENTRAL WALL HERNIA REPAIR BILATERAL TAP BLOCK;  Surgeon: Sheldon Standing, MD;  Location: [facility];  Service: General;  Laterality: N/A;   Patient Active Problem List   Diagnosis Date Noted   Grade II diastolic dysfunction 06/10/2023   Mitral regurgitation 06/10/2023   Aortic stenosis 06/10/2023   Tricuspid regurgitation 06/10/2023   Mild ascending aorta dilation (HCC) 06/10/2023   Murmur, cardiac 04/30/2023   Incarcerated incisional hernia s/p lap repair w mesh 09/17/2021 09/17/2021   Osteopenia 07/05/2021   Lumbar back pain with radiculopathy affecting right lower extremity 03/03/2021   Fall 03/03/2021   Aortic atherosclerosis (HCC) 10/25/2020   GERD (gastroesophageal reflux disease) 04/26/2020   Vitamin D  deficiency 04/25/2020   Arthralgia 10/26/2019   Dry cough 11/22/2017   Nocturnal leg cramps 02/02/2017   DOE (dyspnea on exertion) 08/04/2016   Obesity (BMI 30.0-34.9) 08/04/2016   Generalized osteoarthritis of hand 02/04/2016   Diabetic polyneuropathy associated with diabetes mellitus due to underlying condition (HCC) 11/27/2015   Hiatal  hernia, large 08/02/2015   Colon polyps 08/02/2015   Iron deficiency 04/21/2015   Diabetes type 2, controlled (HCC) 05/03/2014   Hyperlipidemia, mixed 03/18/2014   Interstitial cystitis 07/08/2012   Essential hypertension 02/10/2011    PCP: Geofm Glade PARAS, MD   REFERRING PROVIDER: Mavis Purchase, MD  REFERRING DIAG: [phone] (ICD-10-CM) - Spondylolisthesis of lumbar region  Rationale for Evaluation and Treatment: Rehabilitation  THERAPY DIAG:  Radiculopathy, lumbar region  Muscle weakness (generalized)  Difficulty in walking, not elsewhere classified  ONSET DATE: chronic  exacerbation in 11/2022  SUBJECTIVE:                                                                                                                                                                                           SUBJECTIVE STATEMENT: Pt stated that she has made all of the follow up calls mentioned in the last visit to her insurance company and the neurosurgeons office. Officially has surgery scheduled on April 07,2025 for a PLIF.   PERTINENT HISTORY:    PAIN:  Are you having pain? Yes: NPRS scale: 9/10 at worst Pain location: low back/ RLE Pain description: ache, sharp, burning Aggravating factors: random activity and extension biased positions Relieving factors: sitting  PRECAUTIONS: None  RED FLAGS: None   WEIGHT BEARING RESTRICTIONS: No  FALLS:  Has patient fallen in last 6 months? No   OCCUPATION: retired  PLOF: Independent  PATIENT GOALS: To avoid surgery  NEXT MD VISIT: TBD  OBJECTIVE:  Note: Objective measures were completed at Evaluation unless otherwise noted.  DIAGNOSTIC FINDINGS:    PATIENT SURVEYS:  FOTO 39(52 predicted)  MUSCLE LENGTH: Hamstrings: WFL measured in sitting   POSTURE: decreased lumbar lordosis  PALPATION: deferred  LUMBAR ROM: deferred due to patient apprehension regarding exacerbation of symptoms  AROM eval  Flexion   Extension   Right lateral flexion   Left lateral flexion   Right rotation   Left rotation    (Blank rows = not tested)  LOWER EXTREMITY ROM:   WFL for gait and transfers  Active  Right eval Left eval  Hip flexion    Hip extension    Hip abduction    Hip adduction    Hip internal rotation    Hip external rotation    Knee flexion    Knee extension    Ankle dorsiflexion    Ankle plantarflexion    Ankle inversion    Ankle eversion     (Blank rows = not tested)  LOWER EXTREMITY MMT:    MMT Right eval Left eval  Hip flexion    Hip extension    Hip  abduction    Hip adduction     Hip internal rotation    Hip external rotation    Knee flexion    Knee extension    Ankle dorsiflexion    Ankle plantarflexion    Ankle inversion    Ankle eversion     (Blank rows = not tested)  LUMBAR SPECIAL TESTS:  Straight leg raise test: Negative and Slump test: Positive  FUNCTIONAL TESTS:  30 seconds chair stand test 1 reps  GAIT: Distance walked: 11ft x2 Assistive device utilized: Single point cane Level of assistance: Modified independence Comments: slow cadence with antalgic gait  [facility] Adult PT Treatment:                                                DATE: 06/23/2023 Self Care: Prognosis education PLIF education Post surgical process. POC discussion   [facility] Adult PT Treatment:                                                DATE: 06/16/2023  Therapeutic Exercise: Nu step 8' EOB marching 2x10 Elevated Dead bug2x8 Seated lat activation 2x10 w/5s holds Therapeutic activity POC discussion                                                                                                                      PATIENT EDUCATION:  Education details: Discussed eval findings, rehab rationale and POC and patient is in agreement  Person educated: Patient Education method: Explanation Education comprehension: verbalized understanding and needs further education  HOME EXERCISE PROGRAM: Access Code: [email or web address] URL: [email or web address] Date: 06/11/2023 Prepared by: Reyes Kohut  Exercises - Seated Shoulder Horizontal Abduction with Resistance  - 1 x daily - 5 x weekly - 2 sets - 10 reps - Seated Elbow Flexion with Resistance  - 1 x daily - 5 x weekly - 2 sets - 10 reps - Seated Chest Press with Resistance Band  - 1 x daily - 5 x weekly - 2 sets - 10 reps  ASSESSMENT:  CLINICAL IMPRESSION: Pt attended physical therapy session for continuation of treatment regarding unstable low back pain. Pt reported that she has surgery planned on 08/23/2023, this is  reflected in pt chart. Education was given regarding prognosis, anatomy, post-surgical rehabilitation, and current POC. Due to scheduled surgery, limited progress with physical therapy services, and unstable nature of pts current symptoms/diagnosis, pt is no longer appropriate for skilled outpatient physical therapy. Following completion of PLIF in April 2025, Pt may begin therapy under a new POC at surgeons discretion alongside a new referral. Pt is to be d/c at this time.   Eval impression: Patient is a 73 y.o. female who was seen today for physical therapy evaluation  and treatment for Chronic low back and RLE radiculopathy due to underlying degenerative changes including spondylolisthesis and nerve root impingement.  Patient very apprehensive towards movement as symptoms occur randomly and are debilitating once aggravated.   OPPT will focus on seated core strengthening as patient considers surgical intervention.  OBJECTIVE IMPAIRMENTS: Abnormal gait, decreased activity tolerance, decreased endurance, decreased mobility, difficulty walking, decreased ROM, decreased strength, increased muscle spasms, improper body mechanics, postural dysfunction, obesity, and pain.   ACTIVITY LIMITATIONS: carrying, lifting, bending, sitting, and standing  PERSONAL FACTORS: Age, Education, Fitness, Past/current experiences, and Time since onset of injury/illness/exacerbation are also affecting patient's functional outcome.   REHAB POTENTIAL: Fair based on extent of degenerative changes and need for surgery  as well as unsuccessful conservative treatment  CLINICAL DECISION MAKING: Stable/uncomplicated  EVALUATION COMPLEXITY: Low   GOALS: Goals reviewed with patient? No  SHORT TERM GOALS=LONG TERM GOALS: Target date: 08/09/23  Patient to demonstrate independence in HEP  Baseline: [email or web address] Goal status: INITIAL  2.  Patient will score at least 52% on FOTO to signify clinically meaningful improvement in  functional abilities.   Baseline: 39% Goal status: INITIAL  3.  Patient will acknowledge 6/10 pain at least once during episode of care   Baseline: 9/10 Goal status: INITIAL  4.  Patient will increase 30s chair stand reps from 1 to 2 with/without arms to demonstrate and improved functional ability with less pain/difficulty as well as reduce fall risk.  Baseline: 1 Goal status: INITIAL     PLAN:  PT FREQUENCY: 1x/week  PT DURATION: 6 weeks  PLANNED INTERVENTIONS: 97164- PT Re-evaluation, 97110-Therapeutic exercises, 97530- Therapeutic activity, [id number]- Neuromuscular re-education, 97535- Self Care, [id number]- Manual therapy, [id number]- Gait training, [phone]- Aquatic Therapy, Stair training, and Dry Needling.  PLAN FOR NEXT SESSION: HEP review and update, manual techniques as appropriate, aerobic tasks, ROM and flexibility activities, strengthening and PREs, TPDN, gait and balance training as needed    Mabel Kiang, PT, DPT 06/23/2023, 11:07 AM

## 2023-06-29 ENCOUNTER — Telehealth: Payer: Self-pay | Admitting: Physical Medicine & Rehabilitation

## 2023-06-29 NOTE — Telephone Encounter (Signed)
Pt called to report she had a reaction to Tramadol. She is nauseous and unable to take medication. She stated this was discussed during visit and was advised to call if she had a reaction to medication. Please call patient.

## 2023-06-30 ENCOUNTER — Ambulatory Visit: Payer: Medicare Other | Admitting: Physical Therapy

## 2023-06-30 ENCOUNTER — Telehealth: Payer: Self-pay | Admitting: Internal Medicine

## 2023-07-01 NOTE — Telephone Encounter (Signed)
Copied from CRM [id number]. Topic: Clinical - Prescription Issue >> Jul 01, 2023 10:27 AM Dimitri Ped wrote: Reason for CRM:  patient is needing to speak with a nurse cause I can't get my trulicity  and they just sent a text from the pharmacy about they are not sure when they will have it . And I'm tired of this I think I just need to switch. Patient is wanting to switch to ozempic . I'm willing to try anything . I have surgery coming up in April and I need to keep my blood sugar as normal as possible . I dont know when they will be able to get it . Last time it was 6 weeks and I can't allow that to happen again  She gave me insulin to use but I would rather not do that .  [id number] Heather [name]   ---  Called pharmacy who stated medication will be ready for the pt today.  Called pt and advised her with the info above, and said if she doesn't have the Trulicity by the end of the day to call us back and we can request the wegovy medication. TDW

## 2023-07-06 ENCOUNTER — Encounter: Payer: Medicare Other | Admitting: Physical Therapy

## 2023-07-08 ENCOUNTER — Encounter: Payer: Medicare Other | Attending: Physical Medicine & Rehabilitation | Admitting: Physical Medicine & Rehabilitation

## 2023-07-08 ENCOUNTER — Telehealth: Payer: Self-pay

## 2023-07-08 ENCOUNTER — Encounter: Payer: Self-pay | Admitting: Physical Medicine & Rehabilitation

## 2023-07-08 DIAGNOSIS — E0842 Diabetes mellitus due to underlying condition with diabetic polyneuropathy: Secondary | ICD-10-CM | POA: Diagnosis not present

## 2023-07-08 DIAGNOSIS — M5416 Radiculopathy, lumbar region: Secondary | ICD-10-CM | POA: Diagnosis not present

## 2023-07-08 DIAGNOSIS — G8929 Other chronic pain: Secondary | ICD-10-CM | POA: Insufficient documentation

## 2023-07-08 DIAGNOSIS — R931 Abnormal findings on diagnostic imaging of heart and coronary circulation: Secondary | ICD-10-CM

## 2023-07-08 MED ORDER — HYDROCODONE-ACETAMINOPHEN 5-325 MG PO TABS: 1.0000 | ORAL_TABLET | Freq: Three times a day (TID) | ORAL | 0 refills | Status: DC | PRN

## 2023-07-08 NOTE — Progress Notes (Signed)
 Subjective:    Patient ID: Heather [name], female    DOB: April 10, 1951, 73 y.o.   MRN: [id number]  HPI  73 year old female with history of lumbar spinal stenosis mainly affecting L4-5 segment as well as history of diabetes with peripheral neuropathy is here for chronic low back pain that radiates into the lower extremities.  Her sleep is poor Could not tolerate tramadol due to GI upset even with 1/2 tab (25mg ) Trying acetaminophen and ibuprofen but unable to reduce pain score below level 8. Was able to tolerate hydrocodone as long as she took it with food.  Pt sometimes took 1/2 tab.  Did not have side effects.    Helps her quadriplegic brother  Scheduled for PLIF August 23, 2023 Discussed abnormal ECHO result , recommended that pt discuss this with PCP, may need cardiology eval for pre op clearance.   Pain Inventory Average Pain 8 Pain Right Now 8 My pain is constant, sharp, burning, stabbing, tingling, and aching  In the last 24 hours, has pain interfered with the following? General activity 7 Relation with others 7 Enjoyment of life 7 What TIME of day is your pain at its worst? morning , daytime, evening, and night Sleep (in general) Poor  Pain is worse with: walking, bending, standing, and some activites Pain improves with: rest and heat/ice Relief from Meds: 0  Family History  Problem Relation Age of Onset   Heart attack Mother        in 40s   Hypertension Mother    Stroke Mother         in 76s   Other Father        Deceased, 31   Breast cancer Sister    Healthy Brother    Healthy Daughter    Healthy Son    Stroke Maternal Grandfather 50   Heart attack Paternal Grandfather        in 51s   Diabetes Neg Hx    Social History   Socioeconomic History   Marital status: Widowed    Spouse name: Not on file   Number of children: 2   Years of education: Not on file   Highest education level: Bachelor's degree (e.g., BA, AB, BS)  Occupational History   Not on file   Tobacco Use   Smoking status: Former    Current packs/day: 0.00    Types: Cigarettes    Quit date: 05/18/1984    Years since quitting: 39.1   Smokeless tobacco: Never   Tobacco comments:    smoked 1972-1986, up to 1 ppd  Vaping Use   Vaping status: Never Used  Substance and Sexual Activity   Alcohol use: No    Alcohol/week: 0.0 standard drinks of alcohol   Drug use: No   Sexual activity: Never  Other Topics Concern   Not on file  Social History Narrative   Lives with brother, daughter and 2 grandchildren in a 3 story home.  Has 2 children.  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     Highest level of education:  2 years of graduate school   Social Drivers of Health   Financial Resource Strain: Low Risk  (06/14/2023)   Overall Financial Resource Strain (CARDIA)    Difficulty of Paying Living Expenses: Not very hard  Food Insecurity: No Food Insecurity (06/14/2023)   Hunger Vital Sign    Worried About Running Out of Food in the Last  Year: Never true    Ran Out of Food in the Last Year: Never true  Transportation Needs: No Transportation Needs (06/14/2023)   PRAPARE - Administrator, Civil Service (Medical): No    Lack of Transportation (Non-Medical): No  Physical Activity: Unknown (06/14/2023)   Exercise Vital Sign    Days of Exercise per Week: 0 days    Minutes of Exercise per Session: Not on file  Recent Concern: Physical Activity - Inactive (06/14/2023)   Exercise Vital Sign    Days of Exercise per Week: 0 days    Minutes of Exercise per Session: 0 min  Stress: Stress Concern Present (06/14/2023)   [employer] of Occupational Health - Occupational Stress Questionnaire    Feeling of Stress : To some extent  Social Connections: Socially Isolated (06/14/2023)   Social Connection and Isolation Panel [NHANES]    Frequency of Communication with Friends and Family: Twice a week    Frequency of Social Gatherings  with Friends and Family: Once a week    Attends Religious Services: Never    Database administrator or Organizations: No    Attends Engineer, structural: Not on file    Marital Status: Widowed   Past Surgical History:  Procedure Laterality Date   APPENDECTOMY     BILATERAL SALPINGOOPHORECTOMY     painful cysts   CATARACT EXTRACTION, BILATERAL     COLONOSCOPY  2005   negative; Dr Kinnie Scales. F/U declined   CYSTOSCOPY     X 4; Dr Brunilda Payor   INSERTION OF MESH N/A 09/17/2021   Procedure: INSERTION OF MESH;  Surgeon: Karie Soda, MD;  Location: [facility];  Service: General;  Laterality: N/A;   LAPAROSCOPIC RIGHT COLECTOMY Right 08/19/2015   Procedure: LAPAROSCOPIC ASSISTED  RIGHT COLECTOMY;  Surgeon: Ovidio Kin, MD;  Location: [facility];  Service: General;  Laterality: Right;   LYSIS OF ADHESION N/A 09/17/2021   Procedure: LYSIS OF ADHESION;  Surgeon: Karie Soda, MD;  Location: [facility];  Service: General;  Laterality: N/A;   PREMAGLINANT POLYPS     TOTAL ABDOMINAL HYSTERECTOMY     metromenorrhagia   VENTRAL HERNIA REPAIR N/A 09/17/2021   Procedure: LAPAROSCOPIC VENTRAL WALL HERNIA REPAIR BILATERAL TAP BLOCK;  Surgeon: Karie Soda, MD;  Location: [facility];  Service: General;  Laterality: N/A;   Past Surgical History:  Procedure Laterality Date   APPENDECTOMY     BILATERAL SALPINGOOPHORECTOMY     painful cysts   CATARACT EXTRACTION, BILATERAL     COLONOSCOPY  2005   negative; Dr Kinnie Scales. F/U declined   CYSTOSCOPY     X 4; Dr Brunilda Payor   INSERTION OF MESH N/A 09/17/2021   Procedure: INSERTION OF MESH;  Surgeon: Karie Soda, MD;  Location: [facility];  Service: General;  Laterality: N/A;   LAPAROSCOPIC RIGHT COLECTOMY Right 08/19/2015   Procedure: LAPAROSCOPIC ASSISTED  RIGHT COLECTOMY;  Surgeon: Ovidio Kin, MD;  Location: [facility];  Service: General;  Laterality: Right;   LYSIS OF ADHESION N/A 09/17/2021   Procedure: LYSIS OF ADHESION;  Surgeon: Karie Soda, MD;  Location: [facility];  Service: General;   Laterality: N/A;   PREMAGLINANT POLYPS     TOTAL ABDOMINAL HYSTERECTOMY     metromenorrhagia   VENTRAL HERNIA REPAIR N/A 09/17/2021   Procedure: LAPAROSCOPIC VENTRAL WALL HERNIA REPAIR BILATERAL TAP BLOCK;  Surgeon: Karie Soda, MD;  Location: [facility];  Service: General;  Laterality: N/A;   Past Medical History:  Diagnosis Date  Arthritis    Colon polyps    Diabetes mellitus without complication (HCC)    Environmental allergies    GERD (gastroesophageal reflux disease)    Heart murmur    since birth never a problem per pt   History of DVT (deep vein thrombosis) 01/16/1969   History of hiatal hernia    History of transfusion    Hyperlipidemia    Hypertension    Interstitial cystitis    Dr Brunilda Payor   PONV (postoperative nausea and vomiting)    BP (!) 151/85   Pulse 82   Ht 5\' 1"  (1.549 m)   Wt 178 lb (80.7 kg)   SpO2 96%   BMI 33.63 kg/m   Opioid Risk Score:   Fall Risk Score:  `1  Depression screen [facility] 2/9     07/08/2023   10:36 AM 06/10/2023    1:52 PM 09/09/2022    2:34 PM 07/03/2022    9:18 AM 07/03/2022    9:16 AM 04/29/2022    8:14 AM 10/28/2021    9:57 AM  Depression screen PHQ 2/9  Decreased Interest 1 0 0 0 0 0 0  Down, Depressed, Hopeless 1 1 0 0 0 0 2  PHQ - 2 Score 2 1 0 0 0 0 2  Altered sleeping  0    0 2  Tired, decreased energy  0    1 1  Change in appetite  0    1 1  Feeling bad or failure about yourself   0    0 1  Trouble concentrating  0    0 1  Moving slowly or fidgety/restless  0    0 0  Suicidal thoughts  0    0 0  PHQ-9 Score  1    2 8   Difficult doing work/chores  Somewhat difficult    Not difficult at all Somewhat difficult     Review of Systems  Musculoskeletal:  Positive for back pain and gait problem.  All other systems reviewed and are negative.      Objective:   Physical Exam Vitals and nursing note reviewed.  Constitutional:      Appearance: She is obese.  HENT:     Head: Normocephalic and atraumatic.  Eyes:      Extraocular Movements: Extraocular movements intact.     Conjunctiva/sclera: Conjunctivae normal.     Pupils: Pupils are equal, round, and reactive to light.  Musculoskeletal:     Comments: Lumbar spine ranges of motion is 50% with flexion 0 with lumbar extension in fact she tends to be in a forward flexed posture about 20 degrees forward.  She has very limited lateral bending as well. Her hip range of motion is normal on the right side with internal ex rotation but on left side is limited internal rotation  Neurological:     Mental Status: She is alert and oriented to person, place, and time.     Sensory: No sensory deficit.     Comments: Motor strength is 5/5 bilateral hip flexor knee extensor ankle dorsiflexor Negative straight leg raise bilaterally. Sensation reduced below the knees bilaterally  Psychiatric:        Mood and Affect: Mood normal.        Behavior: Behavior normal.           Assessment & Plan:  1.  History of lumbar spinal stenosis at L4-5 with spondylolisthesis grade 1-2 at that level.  She is scheduled for posterior lumbar interbody  fusion with Dr. Lovell Sheehan 08/23/2022.  She has had an echocardiogram with some abnormal findings and I have encouraged patient to follow-up with her PCP on this.  She may need additional preoperative clearance. In terms of her pain medication we will discontinue tramadol due to GI intolerance She has tolerated hydrocodone in the past.  She feels like she can get by with only 2 tablets/day or less as she sometimes takes 1/2 tablet at a time. Will write a prescription for hydrocodone 5//325 mg 1 tablet twice a day #60.  She thinks this will last her until her surgery in the past 6 weeks.  We discussed that if her surgery is postponed she will need to make another appointment and undergo urine drug screen as well as controlled substance agreement prior to next prescription. Postoperatively I would anticipate Dr. Lovell Sheehan will manage her pain.  He  will be weaned off of narcotic analgesics as she heals from her surgery.  Given her limited mobility she may benefit from physical therapy postoperatively.  I plan to see her back on a as needed basis.

## 2023-07-08 NOTE — Telephone Encounter (Signed)
 Copied from CRM [id number]. Topic: Referral - Request for Referral >> Jul 08, 2023 12:04 PM Fredrich Romans wrote: Did the patient discuss referral with their provider in the last year? No (If No - schedule appointment) (If Yes - send message)  Appointment offered? No  Type of order/referral and detailed reason for visit: pain clinic  told her that she needs to be seen by a cardiologist for preop clearance after looking at the 2D echo-needs referral sent out  Preference of office, provider, location: any that she can get in to see before surgery on April 7th  If referral order, have you been seen by this specialty before? No (If Yes, this issue or another issue? When? Where?  Can we respond through MyChart? Yes

## 2023-07-08 NOTE — Patient Instructions (Addendum)
 Please do not take gabapentin, hydrocodone , and tizanidine at the same time . May try stopping tizanidine if possible while taking hydrocodone   You may need a cardiology appt for pre op clearance , please discuss with Dr Lawerance Bach   Please call if surgery is postponed, you will need an office visit, urine drug screen and controlled substance agreement prior to next prescription  Dr Lovell Sheehan will manage post operative pain. You may need PT post operatively

## 2023-07-08 NOTE — Addendum Note (Signed)
 Addended by: Pincus Sanes on: 07/08/2023 06:55 PM   Modules accepted: Orders

## 2023-07-08 NOTE — Telephone Encounter (Signed)
Cardiology referral ordered 

## 2023-07-09 NOTE — Telephone Encounter (Signed)
 Spoke with patient today.

## 2023-07-14 ENCOUNTER — Encounter: Payer: Medicare Other | Admitting: Physical Therapy

## 2023-07-20 DIAGNOSIS — M4316 Spondylolisthesis, lumbar region: Secondary | ICD-10-CM | POA: Diagnosis not present

## 2023-07-26 ENCOUNTER — Ambulatory Visit: Attending: Nurse Practitioner | Admitting: Nurse Practitioner

## 2023-07-26 ENCOUNTER — Other Ambulatory Visit: Payer: Self-pay | Admitting: Internal Medicine

## 2023-07-26 ENCOUNTER — Encounter: Payer: Self-pay | Admitting: Nurse Practitioner

## 2023-07-26 VITALS — BP 148/94 | HR 66 | Ht 60.0 in | Wt 177.4 lb

## 2023-07-26 DIAGNOSIS — I700 Atherosclerosis of aorta: Secondary | ICD-10-CM | POA: Diagnosis not present

## 2023-07-26 DIAGNOSIS — E1142 Type 2 diabetes mellitus with diabetic polyneuropathy: Secondary | ICD-10-CM | POA: Diagnosis not present

## 2023-07-26 DIAGNOSIS — I340 Nonrheumatic mitral (valve) insufficiency: Secondary | ICD-10-CM

## 2023-07-26 DIAGNOSIS — E785 Hyperlipidemia, unspecified: Secondary | ICD-10-CM

## 2023-07-26 DIAGNOSIS — R931 Abnormal findings on diagnostic imaging of heart and coronary circulation: Secondary | ICD-10-CM | POA: Diagnosis not present

## 2023-07-26 DIAGNOSIS — Z01810 Encounter for preprocedural cardiovascular examination: Secondary | ICD-10-CM | POA: Diagnosis not present

## 2023-07-26 DIAGNOSIS — I77810 Thoracic aortic ectasia: Secondary | ICD-10-CM

## 2023-07-26 DIAGNOSIS — I350 Nonrheumatic aortic (valve) stenosis: Secondary | ICD-10-CM | POA: Diagnosis not present

## 2023-07-26 DIAGNOSIS — I10 Essential (primary) hypertension: Secondary | ICD-10-CM

## 2023-07-26 MED ORDER — OLMESARTAN MEDOXOMIL 40 MG PO TABS: 40.0000 mg | ORAL_TABLET | Freq: Every day | ORAL | 3 refills | Status: AC

## 2023-07-26 NOTE — Progress Notes (Unsigned)
 Office Visit    Patient Name: Heather [name] Date of Encounter: 07/26/2023  Primary Care Provider:  Pincus Sanes, MD Primary Cardiologist:  Chrystie Nose, MD  Chief Complaint    73 year old female with a history of elevated coronary calcium score, aortic atherosclerosis, mitral valve regurgitation, aortic stenosis, mild dilation of the ascending aorta, hypertension, hyperlipidemia, prior DVT, type 2 diabetes,  peripheral neuropathy, lumbar spinal stenosis and GERD who presents for preoperative cardiac evaluation.  Past Medical History    Past Medical History:  Diagnosis Date   Arthritis    Colon polyps    Diabetes mellitus without complication (HCC)    Environmental allergies    GERD (gastroesophageal reflux disease)    Heart murmur    since birth never a problem per pt   History of DVT (deep vein thrombosis) 01/16/1969   History of hiatal hernia    History of transfusion    Hyperlipidemia    Hypertension    Interstitial cystitis    Dr Brunilda Payor   PONV (postoperative nausea and vomiting)    Past Surgical History:  Procedure Laterality Date   APPENDECTOMY     BILATERAL SALPINGOOPHORECTOMY     painful cysts   CATARACT EXTRACTION, BILATERAL     COLONOSCOPY  2005   negative; Dr Kinnie Scales. F/U declined   CYSTOSCOPY     X 4; Dr Brunilda Payor   INSERTION OF MESH N/A 09/17/2021   Procedure: INSERTION OF MESH;  Surgeon: Karie Soda, MD;  Location: [facility];  Service: General;  Laterality: N/A;   LAPAROSCOPIC RIGHT COLECTOMY Right 08/19/2015   Procedure: LAPAROSCOPIC ASSISTED  RIGHT COLECTOMY;  Surgeon: Ovidio Kin, MD;  Location: [facility];  Service: General;  Laterality: Right;   LYSIS OF ADHESION N/A 09/17/2021   Procedure: LYSIS OF ADHESION;  Surgeon: Karie Soda, MD;  Location: [facility];  Service: General;  Laterality: N/A;   PREMAGLINANT POLYPS     TOTAL ABDOMINAL HYSTERECTOMY     metromenorrhagia   VENTRAL HERNIA REPAIR N/A 09/17/2021   Procedure: LAPAROSCOPIC VENTRAL WALL HERNIA  REPAIR BILATERAL TAP BLOCK;  Surgeon: Karie Soda, MD;  Location: [facility];  Service: General;  Laterality: N/A;    Allergies  Allergies  Allergen Reactions   Tramadol Nausea And Vomiting   Farxiga [Dapagliflozin]     Constant Vaginal Infections   Flagyl [Metronidazole] Other (See Comments)    Made really sick. Was able to tolerate dose 4/17 with zofran   Jardiance [Empagliflozin]     Constant Vaginal infections   Lisinopril     04/03/15 cough reported X several months   Cholestyramine Other (See Comments)    Joint pain   Metformin And Related Other (See Comments)    Loose stools   Prandin [Repaglinide] Nausea Only    Lowered Blood Sugar    Sulfonamide Derivatives     nausea     Labs/Other Studies Reviewed    The following studies were reviewed today:  Cardiac Studies & Procedures   ______________________________________________________________________________________________   STRESS TESTS  ECHOCARDIOGRAM STRESS TEST 09/03/2016  Narrative *[facility]* [address] [place], [place] [address] [phone]  ------------------------------------------------------------------- Stress Echocardiography  Patient:    Heather, [name] MR #:       [id number] Study Date: 09/03/2016 Gender:     F Age:        36 Height:     157.5 cm Weight:     89.5 kg BSA:        2.02  m^2 Pt. Status: Room:  ATTENDING    Donato Schultz, M.D. PERFORMING   [facility], Outpatient SONOGRAPHER  [facility], RDCS ORDERING     Calla Kicks J  cc:  -------------------------------------------------------------------  ------------------------------------------------------------------- Indications:      Chest Pain (R07.9).  ------------------------------------------------------------------- History:   PMH:   Chest pain.  Dyspnea and murmur.  Risk factors: Deep Vein Thrombosis. Family history of coronary artery disease. Former tobacco use.  Hypertension. Diabetes mellitus. Dyslipidemia. Medications:  No other medications.  ------------------------------------------------------------------- Study Conclusions  - Stress ECG conclusions: There were no stress arrhythmias or conduction abnormalities. The stress ECG was negative for ischemia. - Staged echo: Normal echo stress  Impressions:  - Normal study after pharmacologic stress.  ------------------------------------------------------------------- Study data:   Study status:  Routine.  Consent:  The risks, benefits, and alternatives to the procedure were explained to the patient and informed consent was obtained.  Procedure:  Initial setup. The patient was brought to the laboratory. A baseline ECG was recorded. Surface ECG leads and automatic cuff blood pressure measurements were monitored.  Dobutamine stress test. Stress testing was performed, with dobutamine infusion from 10 to 30 mcg/kg/min by 10 mcg/kg/min increments. Heart rate response was augmented by the addition of hand grips and leg lifts. The infusion was terminated due to target heart rate achievement. Transthoracic stress echocardiography for chest pain evaluation. Images were captured at baseline, low dose, peak dose, and recovery.  Study completion:  The patient tolerated the procedure well. There were no complications.          Dobutamine. Stress echocardiography. 2D.  Birthdate:  Patient birthdate: 1950-08-17.  Age:  Patient is 73 yr old.  Sex:  Gender: female.    BMI: 36.1 kg/m^2.  Blood pressure:     119/76  Patient status:  Outpatient.  Study date: Study date: 09/03/2016. Study time: 03:09 PM.  -------------------------------------------------------------------  ------------------------------------------------------------------- Baseline ECG:  Normal.  ------------------------------------------------------------------- Stress  protocol:  +-----------------------+---+-----------+--------------+ !Stage                  !HR !BP (mmHg)  !Symptoms      ! +-----------------------+---+-----------+--------------+ !Baseline               !75 !119/76 (90)!None          ! +-----------------------+---+-----------+--------------+ !Dobutamine 10 ug/kg/min!76 !133/56 (82)!Jaw Discomfort! +-----------------------+---+-----------+--------------+ !Dobutamine 20 ug/kg/min!106!124/45 (71)!Dyspnea       ! +-----------------------+---+-----------+--------------+ !Dobutamine 30 ug/kg/min!139!117/57 (77)!--------------! +-----------------------+---+-----------+--------------+ !Recovery; 1 min        !133!117/57 (77)!--------------! +-----------------------+---+-----------+--------------+ !Recovery; 2 min        !123!-----------!--------------! +-----------------------+---+-----------+--------------+ !Recovery; 3 min        !120!121/67 (85)!Subsiding     ! +-----------------------+---+-----------+--------------+ !Recovery; 4 min        !109!-----------!--------------! +-----------------------+---+-----------+--------------+ !Recovery; 5 min        !101!115/46 (69)!--------------! +-----------------------+---+-----------+--------------+ !Recovery; 6 min        !96 !-----------!--------------! +-----------------------+---+-----------+--------------+ !Recovery; 7 min        !89 !107/60 (76)!--------------! +-----------------------+---+-----------+--------------+ !Late recovery          !88 !97/57 (70) !None          ! +-----------------------+---+-----------+--------------+  ------------------------------------------------------------------- Stress results:   Maximal heart rate during stress was 139 bpm (91% of maximal predicted heart rate). The maximal predicted heart rate was 153 bpm.The target heart rate was achieved. The heart rate response to stress was normal. There was a normal resting blood pressure with an appropriate  response to stress.  Normal blood pressure response to dobutamine. The rate-pressure product for the peak heart rate and blood pressure was 95284 mm Hg/min.  The patient experienced no chest pain during stress.  ------------------------------------------------------------------- Stress ECG:  There were no stress arrhythmias or conduction abnormalities.  The stress ECG was negative for ischemia.  ------------------------------------------------------------------- Baseline:  - The estimated LV ejection fraction was 60%. - Normal wall motion; no LV regional wall motion abnormalities.  Low dose:  - The estimated LV ejection fraction was 70%. - Normal wall motion; no LV regional wall motion abnormalities.  Peak stress:  - The estimated LV ejection fraction was 80%. - Normal wall motion; no LV regional wall motion abnormalities.  Recovery:  - The estimated LV ejection fraction was 65%. - Normal wall motion; no LV regional wall motion abnormalities.  ------------------------------------------------------------------- Stress echo results:     Left ventricular ejection fraction was normal at rest and with stress. Normal echo stress  ------------------------------------------------------------------- Prepared and Electronically Authenticated by  Armanda Magic, MD 2018-04-20T06:05:34   ECHOCARDIOGRAM  ECHOCARDIOGRAM COMPLETE 06/08/2023  Narrative ECHOCARDIOGRAM REPORT    Patient Name:   DANN VENTRESS Date of Exam: 06/08/2023 Medical Rec #:  [id number]        Height:       61.0 in Accession #:    [id number]       Weight:       173.0 lb Date of Birth:  03/31/1951        BSA:          1.776 m Patient Age:    72 years         BP:           134/90 mmHg Patient Gender: F                HR:           69 bpm. Exam Location:  [address]  Procedure: 2D Echo, Cardiac Doppler, Color Doppler, 3D Echo and Strain Analysis  Indications:    R01.1 Murmur  History:        Patient has  prior history of Echocardiogram examinations, most recent 09/02/2016. Signs/Symptoms:Murmur; Risk Factors:Hypertension, Diabetes, Dyslipidemia, Former Smoker and Family History of Coronary Artery Disease.  Sonographer:    Samule Ohm RDCS Referring Phys: [id number] STACY J BURNS  IMPRESSIONS   1. Left ventricular ejection fraction, by estimation, is 65 to 70%. Left ventricular ejection fraction by 3D volume is 72 %. The left ventricle has normal function. The left ventricle has no regional wall motion abnormalities. Left ventricular diastolic parameters are consistent with Grade II diastolic dysfunction (pseudonormalization). The average left ventricular global longitudinal strain is 24.4 %. The global longitudinal strain is normal. 2. Right ventricular systolic function is normal. The right ventricular size is mildly enlarged. There is mildly elevated pulmonary artery systolic pressure. 3. Left atrial size was mildly dilated. 4. The mitral valve is normal in structure. Mild to moderate mitral valve regurgitation. No evidence of mitral stenosis. 5. Tricuspid valve regurgitation is mild to moderate. 6. The aortic valve is calcified. There is mild thickening of the aortic valve. Aortic valve regurgitation is not visualized. Mild to moderate aortic valve stenosis. Aortic valve area, by VTI measures 1.52 cm. Aortic valve mean gradient measures 18.0 mmHg. Aortic valve Vmax measures 3.03 m/s. 7. There is mild dilatation of the ascending aorta, measuring 38 mm. 8. The inferior vena cava is normal in size with greater than 50% respiratory variability, suggesting right atrial pressure of 3  mmHg.  FINDINGS Left Ventricle: Left ventricular ejection fraction, by estimation, is 65 to 70%. Left ventricular ejection fraction by 3D volume is 72 %. The left ventricle has normal function. The left ventricle has no regional wall motion abnormalities. The average left ventricular global longitudinal strain is  24.4 %. The global longitudinal strain is normal. The left ventricular internal cavity size was normal in size. There is no left ventricular hypertrophy. Left ventricular diastolic parameters are consistent with Grade II diastolic dysfunction (pseudonormalization).  Right Ventricle: The right ventricular size is mildly enlarged. No increase in right ventricular wall thickness. Right ventricular systolic function is normal. There is mildly elevated pulmonary artery systolic pressure. The tricuspid regurgitant velocity is 3.09 m/s, and with an assumed right atrial pressure of 3 mmHg, the estimated right ventricular systolic pressure is 41.2 mmHg.  Left Atrium: Left atrial size was mildly dilated.  Right Atrium: Right atrial size was normal in size.  Pericardium: There is no evidence of pericardial effusion. Presence of epicardial fat layer.  Mitral Valve: The mitral valve is normal in structure. Mild to moderate mitral valve regurgitation. No evidence of mitral valve stenosis.  Tricuspid Valve: The tricuspid valve is normal in structure. Tricuspid valve regurgitation is mild to moderate. No evidence of tricuspid stenosis.  Aortic Valve: The aortic valve is calcified. There is mild thickening of the aortic valve. Aortic valve regurgitation is not visualized. Mild to moderate aortic stenosis is present. Aortic valve mean gradient measures 18.0 mmHg. Aortic valve peak gradient measures 36.7 mmHg. Aortic valve area, by VTI measures 1.52 cm.  Pulmonic Valve: The pulmonic valve was normal in structure. Pulmonic valve regurgitation is not visualized. No evidence of pulmonic stenosis.  Aorta: There is mild dilatation of the ascending aorta, measuring 38 mm.  Venous: The inferior vena cava is normal in size with greater than 50% respiratory variability, suggesting right atrial pressure of 3 mmHg.  IAS/Shunts: No atrial level shunt detected by color flow Doppler.   LEFT VENTRICLE PLAX 2D LVIDd:          3.90 cm         Diastology LVIDs:         2.30 cm         LV e' medial:    7.62 cm/s LV PW:         1.10 cm         LV E/e' medial:  16.8 LV IVS:        1.10 cm         LV e' lateral:   8.81 cm/s LVOT diam:     2.10 cm         LV E/e' lateral: 14.5 LV SV:         109 LV SV Index:   61              2D LVOT Area:     3.46 cm        Longitudinal Strain 2D Strain GLS  23.2 % (A2C): 2D Strain GLS  22.7 % (A3C): 2D Strain GLS  27.3 % (A4C): 2D Strain GLS  24.4 % Avg:  3D Volume EF LV 3D EF:    Left ventricul ar ejection fraction by 3D volume is 72 %.  3D Volume EF: 3D EF:        72 % LV EDV:       134 ml LV ESV:       38 ml LV SV:  96 ml  RIGHT VENTRICLE             IVC RV S prime:     17.70 cm/s  IVC diam: 0.70 cm TAPSE (M-mode): 2.2 cm RVSP:           41.2 mmHg  LEFT ATRIUM             Index        RIGHT ATRIUM           Index LA diam:        3.80 cm 2.14 cm/m   RA Pressure: 3.00 mmHg LA Vol (A2C):   57.0 ml 32.10 ml/m  RA Area:     16.10 cm LA Vol (A4C):   63.9 ml 35.98 ml/m  RA Volume:   40.20 ml  22.64 ml/m LA Biplane Vol: 60.1 ml 33.84 ml/m AORTIC VALVE AV Area (Vmax):    1.51 cm AV Area (Vmean):   1.52 cm AV Area (VTI):     1.52 cm AV Vmax:           303.00 cm/s AV Vmean:          196.000 cm/s AV VTI:            0.717 m AV Peak Grad:      36.7 mmHg AV Mean Grad:      18.0 mmHg LVOT Vmax:         132.00 cm/s LVOT Vmean:        85.800 cm/s LVOT VTI:          0.315 m LVOT/AV VTI ratio: 0.44  AORTA Ao Root diam: 3.00 cm Ao Asc diam:  3.80 cm  MITRAL VALVE                TRICUSPID VALVE MV Area (PHT): 3.02 cm     TR Peak grad:   38.2 mmHg MV Decel Time: 251 msec     TR Vmax:        309.00 cm/s MV E velocity: 128.00 cm/s  Estimated RAP:  3.00 mmHg MV A velocity: 111.00 cm/s  RVSP:           41.2 mmHg MV E/A ratio:  1.15 SHUNTS Systemic VTI:  0.32 m Systemic Diam: 2.10 cm  Kardie Tobb DO Electronically signed by Thomasene Ripple  DO Signature Date/Time: 06/08/2023/2:44:30 PM    Final      CT SCANS  CT CARDIAC SCORING (SELF PAY ONLY) 05/07/2023  Addendum 05/10/2023  1:54 PM ADDENDUM REPORT: 05/10/2023 13:52  EXAM: OVER-READ INTERPRETATION  CT CHEST  The following report is an over-read performed by radiologist Dr. Royal [facility] on 05/10/2023. This over-read does not include interpretation of cardiac or coronary anatomy or pathology. The coronary calcium score interpretation by the cardiologist is attached.  COMPARISON:  None available.  FINDINGS: Atherosclerotic calcifications are noted in the thoracic aorta. Within the visualized portions of the thorax there are no suspicious appearing pulmonary nodules or masses, there is no acute consolidative airspace disease, no pleural effusions, no pneumothorax and no lymphadenopathy. Large hiatal hernia. Visualized portions of the upper abdomen are unremarkable. There are no aggressive appearing lytic or blastic lesions noted in the visualized portions of the skeleton.  IMPRESSION: 1. Aortic Atherosclerosis (ICD10-I70.0). 2. Large hiatal hernia.   Electronically Signed By: Trudie Reed M.D. On: 05/10/2023 13:52  Narrative : Cardiovascular Disease Risk stratification  EXAM: Coronary Calcium Score  TECHNIQUE: A gated, non-contrast computed tomography scan of the  heart was performed using 3mm slice thickness. Axial images were analyzed on a dedicated workstation. Calcium scoring of the coronary arteries was performed using the Agatston method.  FINDINGS: Coronary arteries: Normal origins.  Coronary Calcium Score:  Left main: 0  Left anterior descending artery: 137  Left circumflex artery: 17.9  Right coronary artery: 0  Total: 154  Percentile: 65  Pericardium: Normal.  Ascending Aorta: Mild dilatation (38.9 mm) of the proximal ascending aorta. Mild aortic root calcification.  Non-cardiac: See  separate report from [facility].  IMPRESSION: Coronary calcium score of 154. This was 23 percentile for age-, race-, and sex-matched controls.  RECOMMENDATIONS: Coronary artery calcium (CAC) score is a strong predictor of incident coronary heart disease (CHD) and provides predictive information beyond traditional risk factors. CAC scoring is reasonable to use in the decision to withhold, postpone, or initiate statin therapy in intermediate-risk or selected borderline-risk asymptomatic adults (age 64-75 years and LDL-C >=70 to <190 mg/dL) who do not have diabetes or established atherosclerotic cardiovascular disease (ASCVD).* In intermediate-risk (10-year ASCVD risk >=7.5% to <20%) adults or selected borderline-risk (10-year ASCVD risk >=5% to <7.5%) adults in whom a CAC score is measured for the purpose of making a treatment decision the following recommendations have been made:  If CAC=0, it is reasonable to withhold statin therapy and reassess in 5 to 10 years, as long as higher risk conditions are absent (diabetes mellitus, family history of premature CHD in first degree relatives (males <55 years; females <65 years), cigarette smoking, or LDL >=190 mg/dL).  If CAC is 1 to 99, it is reasonable to initiate statin therapy for patients >=39 years of age.  If CAC is >=100 or >=75th percentile, it is reasonable to initiate statin therapy at any age.  Cardiology referral should be considered for patients with CAC scores >=400 or >=75th percentile.  *2018 AHA/ACC/AACVPR/AAPA/ABC/ACPM/ADA/AGS/APhA/ASPC/NLA/PCNA Guideline on the Management of Blood Cholesterol: A Report of the American College of Cardiology/American Heart Association Task Force on Clinical Practice Guidelines. J Am Coll Cardiol. 2019;73(24):3168-3209.  Thomasene Ripple, DO  The noncardiac portion of this study will be interpreted in separate report by the radiologist.  Electronically Signed: By: Thomasene Ripple D.O. On: 05/07/2023 21:40     ______________________________________________________________________________________________     Recent Labs: 04/30/2023: ALT 14; BUN 16; Creatinine, Ser 0.85; Hemoglobin 13.8; Platelets 373.0; Potassium 4.0; Sodium 137  Recent Lipid Panel    Component Value Date/Time   CHOL 234 (H) 04/30/2023 1022   CHOL 333 (H) 03/15/2014 0810   TRIG 197.0 (H) 04/30/2023 1022   TRIG 263 (H) 03/15/2014 0810   HDL 63.40 04/30/2023 1022   HDL 63 03/15/2014 0810   CHOLHDL 4 04/30/2023 1022   VLDL 39.4 04/30/2023 1022   LDLCALC 131 (H) 04/30/2023 1022   LDLCALC 217 (H) 03/15/2014 0810   LDLDIRECT 108.0 04/27/2019 1040    History of Present Illness    73 year old female with the above past medical history including elevated coronary calcium score, aortic atherosclerosis, mitral valve regurgitation, aortic stenosis, mild dilation of the ascending aorta, hypertension, hyperlipidemia, prior DVT, type 2 diabetes, peripheral neuropathy, lumbar spinal stenosis, and GERD.  Prior dobutamine stress echo in 2018 was normal.  Coronary calcium score in 04/2023 was 154 (65th percentile). There was evidence of mild dilation of the proximal ascending aorta, mild aortic root calcification.  Echocardiogram per PCP in 05/2023 showed EF 65 to 70%, normal LV function, no RWMA, G2 DD, normal RV systolic function, mildly dilated left atrium, mild  to moderate mitral valve regurgitation, mild to moderate tricuspid valve regurgitation, mild to moderate aortic valve stenosis, mean gradient 18 mmHg, mild dilation of the ascending aorta measuring 38 mm.  She is pending back surgery and was referred to cardiology for preoperative cardiac evaluation in the setting of abnormal echocardiogram.  She presents today for preoperative cardiac evaluation for upcoming PLIF.    Stable from a cardiac standpoint.  She notes occasional mild shortness of breath with activity, she feels this is overall in the  setting of decreased activity due to pain.  She denies chest pain, palpitations, dizziness, presyncope, syncope, edema, PND, orthopnea, weight gain.  She was recently started on rosuvastatin per her primary care doctor.  She was not aware that she was also to continue ezetimibe.  She will start taking her ezetimibe.  She will have repeat labs in 2 months per PCP.  BP has been elevated, will stop losartan and start olmesartan 40 mg daily.  She will continue to monitor BP report to be considered in 130/80.  2 out of 6 murmur.  Asymptomatic with valvular heart disease.  Will plan for repeat echocardiogram in 1 year for routine monitoring.  BMET in 2 weeks with PCP.  Cardiac catheterization in 2012 was normal.  We discussed possible coronary CT angiogram, in the absence of any symptoms, she declines.  Will forward clearance to Dr. Lovell Sheehan of [facility] neurosurgery and spine.  [phone].  Follow-up in 6 months with Dr. Rennis Golden.  She has follow-up with her PCP, can reassess blood pressure.  She will notify us if her blood pressure remains elevated, consider transitioning from hydrochlorothiazide to chlorthalidone, possible addition of amlodipine.  Follow-up in 6 months with Dr. Rennis Golden.  Mother had a stroke, grandfather paternal had a heart attack in his 85s.  She is a widow, active.  He has been related recently in the setting of back pain.. Elevated coronary artery calcium score: Aortic atherosclerosis/mild dilation of the ascending aorta: Valvular heart disease: Mitral valve regurgitation, tricuspid valve regurgitation, aortic stenosis Hypertension: Hyperlipidemia: Type 2 diabetes: Preoperative cardiac exam: According to the Revised Cardiac Risk Index (RCRI), her Perioperative Risk of Major Cardiac Event is (%): 0.9  Her Functional Capacity in METs is: 5.62 according to the [facility] Activity Status Index (DASI). Therefore, based on ACC/AHA guidelines, patient would be at acceptable risk for the planned procedure  without further cardiovascular testing. I will route this recommendation to the requesting party via Epic fax function. Disposition:  Home Medications    Current Outpatient Medications  Medication Sig Dispense Refill   BD PEN NEEDLE MICRO U/F 32G X 6 MM MISC Inject into the skin as directed. 100 each 3   Blood Glucose Monitoring Suppl (ONE TOUCH ULTRA 2) w/Device KIT 1 each by Does not apply route 4 (four) times daily. Use device to monitor glucose levels 4 times per day; E11.9 1 kit 0   diclofenac Sodium (VOLTAREN) 1 % GEL APPLY 4 GRAMS TO AFFECTED JOINTS 4 TIMES A DAY (Patient taking differently: Apply 4 g topically daily as needed (pain).) 400 g 11   Dulaglutide (TRULICITY) 1.5 MG/0.5ML SOAJ INJECT 1.5 MG INTO THE SKIN ONCE A WEEK 2 mL 1   famotidine (PEPCID) 10 MG tablet Take 10 mg by mouth daily as needed for heartburn or indigestion.     gabapentin (NEURONTIN) 100 MG capsule Take 1 capsule (100 mg total) by mouth at bedtime. 180 capsule 1   gabapentin (NEURONTIN) 600 MG tablet Take 1 tablet (600 mg  total) by mouth at bedtime. 180 tablet 1   glucose blood (ONETOUCH ULTRA) test strip TEST ONCE DAILY 100 strip 2   hydrochlorothiazide (HYDRODIURIL) 25 MG tablet Take 1 tablet (25 mg total) by mouth daily. 90 tablet 1   HYDROcodone-acetaminophen (NORCO/VICODIN) 5-325 MG tablet Take 1 tablet by mouth every 8 (eight) hours as needed for moderate pain (pain score 4-6). 60 tablet 0   ibuprofen (ADVIL) 200 MG tablet Take 2 tablets (400 mg total) by mouth every 8 (eight) hours as needed for moderate pain (pain score 4-6). 90 tablet 1   insulin lispro (HUMALOG KWIKPEN) 100 UNIT/ML KwikPen Using as directed with SS   Inject 4-10 units into the skin 3 times a day before meals prn   < 150  0 units 151-200  2 units 201-250  4 units   >250      8 units 15 mL 11   Lancets (ONETOUCH DELICA PLUS LANCET33G) MISC USE TO TEST BLOOD SUGARS ONCE  DAILY 100 each 2   LANTUS SOLOSTAR 100 UNIT/ML Solostar Pen Inject 10  Units into the skin in the morning. 15 mL 0   ondansetron (ZOFRAN-ODT) 4 MG disintegrating tablet Take 1 tablet (4 mg total) by mouth every 8 (eight) hours as needed for nausea or vomiting. Please schedule an office visit for further refills. 20 tablet 0   rosuvastatin (CRESTOR) 5 MG tablet Take 1 tablet (5 mg total) by mouth daily. 90 tablet 1   tiZANidine (ZANAFLEX) 4 MG tablet Take 1 tablet (4 mg total) by mouth every 8 (eight) hours as needed (back pain, muscle spasms). 60 tablet 2   ezetimibe (ZETIA) 10 MG tablet TAKE 1 TABLET BY MOUTH EVERY DAY (Patient not taking: Reported on 07/26/2023) 90 tablet 3   No current facility-administered medications for this visit.     Review of Systems    ***.  All other systems reviewed and are otherwise negative except as noted above.    Physical Exam    VS:  BP (!) 148/94   Pulse 66   Ht 5' (1.524 m)   Wt 177 lb 6.4 oz (80.5 kg)   SpO2 99%   BMI 34.65 kg/m     GEN: Well nourished, well developed, in no acute distress. HEENT: normal. Neck: Supple, no JVD, carotid bruits, or masses. Cardiac: RRR, no murmurs, rubs, or gallops. No clubbing, cyanosis, edema.  Radials/DP/PT 2+ and equal bilaterally.  Respiratory:  Respirations regular and unlabored, clear to auscultation bilaterally. GI: Soft, nontender, nondistended, BS + x 4. MS: no deformity or atrophy. Skin: warm and dry, no rash. Neuro:  Strength and sensation are intact. Psych: Normal affect.  Accessory Clinical Findings    ECG personally reviewed by me today - EKG Interpretation Date/Time:  Monday July 26 2023 08:18:05 EDT Ventricular Rate:  66 PR Interval:  216 QRS Duration:  86 QT Interval:  382 QTC Calculation: 400 R Axis:   30  Text Interpretation: Sinus rhythm with 1st degree A-V block When compared with ECG of 08-Sep-2021 08:16, No significant change was found Confirmed by Bernadene Person ([id number]) on 07/26/2023 8:50:38 AM  - no acute changes.   Lab Results  Component Value  Date   WBC 9.6 04/30/2023   HGB 13.8 04/30/2023   HCT 41.2 04/30/2023   MCV 79.9 04/30/2023   PLT 373.0 04/30/2023   Lab Results  Component Value Date   CREATININE 0.85 04/30/2023   BUN 16 04/30/2023   NA 137 04/30/2023   K  4.0 04/30/2023   CL 99 04/30/2023   CO2 30 04/30/2023   Lab Results  Component Value Date   ALT 14 04/30/2023   AST 17 04/30/2023   ALKPHOS 58 04/30/2023   BILITOT 1.5 (H) 04/30/2023   Lab Results  Component Value Date   CHOL 234 (H) 04/30/2023   HDL 63.40 04/30/2023   LDLCALC 131 (H) 04/30/2023   LDLDIRECT 108.0 04/27/2019   TRIG 197.0 (H) 04/30/2023   CHOLHDL 4 04/30/2023    Lab Results  Component Value Date   HGBA1C 7.0 (H) 04/30/2023    Assessment & Plan    1.  ***  HYPERTENSION CONTROL Vitals:   07/26/23 0813 07/26/23 0932  BP: (!) 168/98 (!) 148/94    The patient's blood pressure is elevated above target today. {Click here to indicate intervention taken to address high BP   Refresh Note :1} *** In order to address the patient's elevated BP:         Joylene Grapes, NP 07/26/2023, 9:33 AM

## 2023-07-26 NOTE — Patient Instructions (Signed)
 Medication Instructions:  Stop Losartan 100 mg daily Start Olmesartan 40 mg daily Continue Rosuvastatin 5 mg daily & Zetia 10 mg daily  *If you need a refill on your cardiac medications before your next appointment, please call your pharmacy*   Lab Work: BMET in 2 weeks  Testing/Procedures: NONE ordered at this time of appointment   Follow-Up: At [facility], you and your health needs are our priority.  As part of our continuing mission to provide you with exceptional heart care, we have created designated Provider Care Teams.  These Care Teams include your primary Cardiologist (physician) and Advanced Practice Providers (APPs -  Physician Assistants and Nurse Practitioners) who all work together to provide you with the care you need, when you need it.  We recommend signing up for the patient portal called "MyChart".  Sign up information is provided on this After Visit Summary.  MyChart is used to connect with patients for Virtual Visits (Telemedicine).  Patients are able to view lab/test results, encounter notes, upcoming appointments, etc.  Non-urgent messages can be sent to your provider as well.   To learn more about what you can do with MyChart, go to [email or web address].    Your next appointment:   6 month(s)  Provider:   Chrystie Nose, MD  or Bernadene Person, NP        Other Instructions Monitor blood pressure. Goal blood pressure is 130/80 or less. Please repeat fasting lab work with your primary care physician as discussed.

## 2023-07-27 ENCOUNTER — Encounter: Payer: Self-pay | Admitting: Nurse Practitioner

## 2023-07-28 ENCOUNTER — Ambulatory Visit: Payer: Medicare Other | Admitting: Cardiology

## 2023-08-02 DIAGNOSIS — M4316 Spondylolisthesis, lumbar region: Secondary | ICD-10-CM | POA: Diagnosis not present

## 2023-08-09 NOTE — Progress Notes (Signed)
 Surgical Instructions   Your procedure is scheduled on Monday, April 7th, 2025. Report to [facility] Main Entrance "A" at 5:30 A.M., then check in with the Admitting office. Any questions or running late day of surgery: call [phone]  Questions prior to your surgery date: call [phone], Monday-Friday, 8am-4pm. If you experience any cold or flu symptoms such as cough, fever, chills, shortness of breath, etc. between now and your scheduled surgery, please notify us at the above number.     Remember:  Do not eat or drink after midnight the night before your surgery    Take these medicines the morning of surgery with A SIP OF WATER: Ezetimibe (Zetia) Rosuvastatin (Crestor)   May take these medicines IF NEEDED: Famotidine (Pepcid) Hydrocodone-acetaminophen (Norco) Ondansetron (Zofran) Tizanidine (Zanaflex)    One week prior to surgery, STOP taking any Aspirin (unless otherwise instructed by your surgeon) Aleve, Naproxen, Ibuprofen, Motrin, Advil, Goody's, BC's, all herbal medications, fish oil, and non-prescription vitamins.  WHAT DO I DO ABOUT MY DIABETES MEDICATION?   Dulaglutide (Trulicity) should be held for 7 days prior to surgery.  Your last dose of Trulicity should be on or before Sunday, March 30th.   THE MORNING OF SURGERY, take __5_ units of _Lantus__insulin.   On the morning of surgery, if your CBG is greater than 220 mg/dL, you may take  of your sliding scale (correction) dose of insulin (Humalog).     HOW TO MANAGE YOUR DIABETES BEFORE AND AFTER SURGERY  Why is it important to control my blood sugar before and after surgery? Improving blood sugar levels before and after surgery helps healing and can limit problems. A way of improving blood sugar control is eating a healthy diet by:  Eating less sugar and carbohydrates  Increasing activity/exercise  Talking with your doctor about reaching your blood sugar goals High blood sugars (greater than 180  mg/dL) can raise your risk of infections and slow your recovery, so you will need to focus on controlling your diabetes during the weeks before surgery. Make sure that the doctor who takes care of your diabetes knows about your planned surgery including the date and location.  How do I manage my blood sugar before surgery? Check your blood sugar at least 4 times a day, starting 2 days before surgery, to make sure that the level is not too high or low.  Check your blood sugar the morning of your surgery when you wake up and every 2 hours until you get to the Short Stay unit.  If your blood sugar is less than 70 mg/dL, you will need to treat for low blood sugar: Do not take insulin. Treat a low blood sugar (less than 70 mg/dL) with  cup of clear juice (cranberry or apple), 4 glucose tablets, OR glucose gel. Recheck blood sugar in 15 minutes after treatment (to make sure it is greater than 70 mg/dL). If your blood sugar is not greater than 70 mg/dL on recheck, call [phone] for further instructions. Report your blood sugar to the short stay nurse when you get to Short Stay.  If you are admitted to the hospital after surgery: Your blood sugar will be checked by the staff and you will probably be given insulin after surgery (instead of oral diabetes medicines) to make sure you have good blood sugar levels. The goal for blood sugar control after surgery is 80-180 mg/dL.  Do NOT Smoke (Tobacco/Vaping) for 24 hours prior to your procedure.  If you use a CPAP at night, you may bring your mask/headgear for your overnight stay.   You will be asked to remove any contacts, glasses, piercing's, hearing aid's, dentures/partials prior to surgery. Please bring cases for these items if needed.    Patients discharged the day of surgery will not be allowed to drive home, and someone needs to stay with them for 24 hours.  SURGICAL WAITING ROOM VISITATION Patients may have no more  than 2 support people in the waiting area - these visitors may rotate.   Pre-op nurse will coordinate an appropriate time for 1 ADULT support person, who may not rotate, to accompany patient in pre-op.  Children under the age of 84 must have an adult with them who is not the patient and must remain in the main waiting area with an adult.  If the patient needs to stay at the hospital during part of their recovery, the visitor guidelines for inpatient rooms apply.  Please refer to the [facility] website for the visitor guidelines for any additional information.   If you received a COVID test during your pre-op visit  it is requested that you wear a mask when out in public, stay away from anyone that may not be feeling well and notify your surgeon if you develop symptoms. If you have been in contact with anyone that has tested positive in the last 10 days please notify you surgeon.      Pre-operative 5 CHG Bathing Instructions   You can play a key role in reducing the risk of infection after surgery. Your skin needs to be as free of germs as possible. You can reduce the number of germs on your skin by washing with CHG (chlorhexidine gluconate) soap before surgery. CHG is an antiseptic soap that kills germs and continues to kill germs even after washing.   DO NOT use if you have an allergy to chlorhexidine/CHG or antibacterial soaps. If your skin becomes reddened or irritated, stop using the CHG and notify one of our RNs at [phone].   Please shower with the CHG soap starting 4 days before surgery using the following schedule:     Please keep in mind the following:  DO NOT shave, including legs and underarms, starting the day of your first shower.   You may shave your face at any point before/day of surgery.  Place clean sheets on your bed the day you start using CHG soap. Use a clean washcloth (not used since being washed) for each shower. DO NOT sleep with pets once you start using  the CHG.   CHG Shower Instructions:  Wash your face and private area with normal soap. If you choose to wash your hair, wash first with your normal shampoo.  After you use shampoo/soap, rinse your hair and body thoroughly to remove shampoo/soap residue.  Turn the water OFF and apply about 3 tablespoons (45 ml) of CHG soap to a CLEAN washcloth.  Apply CHG soap ONLY FROM YOUR NECK DOWN TO YOUR TOES (washing for 3-5 minutes)  DO NOT use CHG soap on face, private areas, open wounds, or sores.  Pay special attention to the area where your surgery is being performed.  If you are having back surgery, having someone wash your back for you may be helpful. Wait 2 minutes after CHG soap is applied, then you may rinse off the CHG soap.  Pat dry with a  clean towel  Put on clean clothes/pajamas   If you choose to wear lotion, please use ONLY the CHG-compatible lotions that are listed below.  Additional instructions for the day of surgery: DO NOT APPLY any lotions, deodorants, cologne, or perfumes.   Do not bring valuables to the hospital. [facility] is not responsible for any belongings/valuables. Do not wear nail polish, gel polish, artificial nails, or any other type of covering on natural nails (fingers and toes) Do not wear jewelry or makeup Put on clean/comfortable clothes.  Please brush your teeth.  Ask your nurse before applying any prescription medications to the skin.     CHG Compatible Lotions   Aveeno Moisturizing lotion  Cetaphil Moisturizing Cream  Cetaphil Moisturizing Lotion  Clairol Herbal Essence Moisturizing Lotion, Dry Skin  Clairol Herbal Essence Moisturizing Lotion, Extra Dry Skin  Clairol Herbal Essence Moisturizing Lotion, Normal Skin  Curel Age Defying Therapeutic Moisturizing Lotion with Alpha Hydroxy  Curel Extreme Care Body Lotion  Curel Soothing Hands Moisturizing Hand Lotion  Curel Therapeutic Moisturizing Cream, Fragrance-Free  Curel Therapeutic Moisturizing  Lotion, Fragrance-Free  Curel Therapeutic Moisturizing Lotion, Original Formula  Eucerin Daily Replenishing Lotion  Eucerin Dry Skin Therapy Plus Alpha Hydroxy Crme  Eucerin Dry Skin Therapy Plus Alpha Hydroxy Lotion  Eucerin Original Crme  Eucerin Original Lotion  Eucerin Plus Crme Eucerin Plus Lotion  Eucerin TriLipid Replenishing Lotion  Keri Anti-Bacterial Hand Lotion  Keri Deep Conditioning Original Lotion Dry Skin Formula Softly Scented  Keri Deep Conditioning Original Lotion, Fragrance Free Sensitive Skin Formula  Keri Lotion Fast Absorbing Fragrance Free Sensitive Skin Formula  Keri Lotion Fast Absorbing Softly Scented Dry Skin Formula  Keri Original Lotion  Keri Skin Renewal Lotion Keri Silky Smooth Lotion  Keri Silky Smooth Sensitive Skin Lotion  Nivea Body Creamy Conditioning Oil  Nivea Body Extra Enriched Lotion  Nivea Body Original Lotion  Nivea Body Sheer Moisturizing Lotion Nivea Crme  Nivea Skin Firming Lotion  NutraDerm 30 Skin Lotion  NutraDerm Skin Lotion  NutraDerm Therapeutic Skin Cream  NutraDerm Therapeutic Skin Lotion  ProShield Protective Hand Cream  Provon moisturizing lotion  Please read over the following fact sheets that you were given.

## 2023-08-10 ENCOUNTER — Other Ambulatory Visit: Payer: Self-pay

## 2023-08-10 ENCOUNTER — Encounter (HOSPITAL_COMMUNITY)
Admission: RE | Admit: 2023-08-10 | Discharge: 2023-08-10 | Disposition: A | Discharge status: Home / Self Care | Source: Ambulatory Visit | Attending: Neurosurgery | Admitting: Neurosurgery

## 2023-08-10 ENCOUNTER — Encounter (HOSPITAL_COMMUNITY): Payer: Self-pay

## 2023-08-10 VITALS — BP 139/78 | HR 72 | Temp 98.3°F | Resp 18 | Ht 60.0 in | Wt 176.6 lb

## 2023-08-10 DIAGNOSIS — Z86718 Personal history of other venous thrombosis and embolism: Secondary | ICD-10-CM | POA: Diagnosis not present

## 2023-08-10 DIAGNOSIS — Z794 Long term (current) use of insulin: Secondary | ICD-10-CM | POA: Diagnosis not present

## 2023-08-10 DIAGNOSIS — K449 Diaphragmatic hernia without obstruction or gangrene: Secondary | ICD-10-CM | POA: Insufficient documentation

## 2023-08-10 DIAGNOSIS — Z01812 Encounter for preprocedural laboratory examination: Secondary | ICD-10-CM | POA: Diagnosis not present

## 2023-08-10 DIAGNOSIS — E119 Type 2 diabetes mellitus without complications: Secondary | ICD-10-CM | POA: Insufficient documentation

## 2023-08-10 DIAGNOSIS — Z01818 Encounter for other preprocedural examination: Secondary | ICD-10-CM

## 2023-08-10 DIAGNOSIS — I083 Combined rheumatic disorders of mitral, aortic and tricuspid valves: Secondary | ICD-10-CM | POA: Insufficient documentation

## 2023-08-10 DIAGNOSIS — E785 Hyperlipidemia, unspecified: Secondary | ICD-10-CM | POA: Insufficient documentation

## 2023-08-10 DIAGNOSIS — I10 Essential (primary) hypertension: Secondary | ICD-10-CM | POA: Insufficient documentation

## 2023-08-10 HISTORY — DX: Atherosclerotic heart disease of native coronary artery without angina pectoris: I25.10

## 2023-08-10 HISTORY — DX: Nonrheumatic aortic (valve) stenosis: I35.0

## 2023-08-10 LAB — BASIC METABOLIC PANEL
Anion gap: 9 (ref 5–15)
BUN: 13 mg/dL (ref 8–23)
CO2: 26 mmol/L (ref 22–32)
Calcium: 9.8 mg/dL (ref 8.9–10.3)
Chloride: 102 mmol/L (ref 98–111)
Creatinine, Ser: 0.9 mg/dL (ref 0.44–1.00)
GFR, Estimated: >60 mL/min (ref >60)
Glucose, Bld: 108 mg/dL — ABNORMAL HIGH (ref 70–99)
Potassium: 4.2 mmol/L (ref 3.5–5.1)
Sodium: 137 mmol/L (ref 135–145)

## 2023-08-10 LAB — HEMOGLOBIN A1C
Hgb A1c MFr Bld: 7 % — ABNORMAL HIGH (ref 4.8–5.6)
Mean Plasma Glucose: 154.2 mg/dL

## 2023-08-10 LAB — TYPE AND SCREEN
ABO/RH(D): A NEG
Antibody Screen: NEGATIVE

## 2023-08-10 LAB — CBC
HCT: 39.7 % (ref 36.0–46.0)
Hemoglobin: 13 g/dL (ref 12.0–15.0)
MCH: 26.2 pg (ref 26.0–34.0)
MCHC: 32.7 g/dL (ref 30.0–36.0)
MCV: 79.9 fL — ABNORMAL LOW (ref 80.0–100.0)
Platelets: 314 10*3/uL (ref 150–400)
RBC: 4.97 MIL/uL (ref 3.87–5.11)
RDW: 12.8 % (ref 11.5–15.5)
WBC: 7.5 10*3/uL (ref 4.0–10.5)
nRBC: 0 % (ref 0.0–0.2)

## 2023-08-10 LAB — SURGICAL PCR SCREEN
MRSA, PCR: NEGATIVE
Staphylococcus aureus: NEGATIVE

## 2023-08-10 LAB — GLUCOSE, CAPILLARY: Glucose-Capillary: 163 mg/dL — ABNORMAL HIGH (ref 70–99)

## 2023-08-10 NOTE — Progress Notes (Signed)
 PCP - Dr. Cheryll Cockayne Cardiologist - Dr. Zoila Shutter  PPM/ICD - denies   Chest x-ray - 11/22/17 EKG - 07/26/23 Stress Test - 09/03/16 ECHO - 06/08/23 Cardiac Cath - 2012  Sleep Study - denies   Fasting Blood Sugar - 120-140 Checks Blood Sugar once a day  Last dose of GLP1 agonist-  08/10/23 GLP1 instructions: Pt knows not to take any further doses prior to surgery  ASA/Blood Thinner Instructions: n/a   ERAS Protcol - no, NPO   COVID TEST- n/a   Anesthesia review: yes, cardiac hx   Patient denies shortness of breath, fever, cough and chest pain at PAT appointment   All instructions explained to the patient, with a verbal understanding of the material. Patient agrees to go over the instructions while at home for a better understanding.  The opportunity to ask questions was provided.

## 2023-08-11 NOTE — Progress Notes (Signed)
 Anesthesia Chart Review:  73 year old female follows with cardiology for history of elevated coronary calcium score, echo), mitral valve regurgitation, mild-mod aortic stenosis (mean gradient 18 mmHg by echo 1/25), mildly dilated ascending aorta, HTN, HLD, prior DVT. Cardiac catheterization in 2012 was normal. Prior dobutamine stress echo in 2018 was normal.  Coronary calcium score in 04/2023 was 154 (65th percentile). There was evidence of mild dilation of the proximal ascending aorta, mild aortic root calcification.  Echocardiogram per PCP in 05/2023 showed EF 65 to 70%, normal LV function, no RWMA, G2 DD, normal RV systolic function, mildly dilated left atrium, mild to moderate mitral valve regurgitation, mild to moderate tricuspid valve regurgitation, mild to moderate aortic valve stenosis, mean gradient 18 mmHg, mild dilation of the ascending aorta measuring 38 mm.  She was seen by Bernadene Person, NP on 07/26/2023 for preop evaluation.  Per note, "According to the Revised Cardiac Risk Index (RCRI), her Perioperative Risk of Major Cardiac Event is (%): 0.9. Her Functional Capacity in METs is: 5.62 according to the [facility] Activity Status Index (DASI). Therefore, based on ACC/AHA guidelines, patient would be at acceptable risk for the planned procedure without further cardiovascular testing. I will route this recommendation to the requesting party via Epic fax function."  Other pertinent history includes postoperative nausea and vomiting, hiatal hernia, GERD on H2 blocker, IDDM2.  Preop labs reviewed, unremarkable, A1c 7.0.  EKG 07/26/2023: Sinus rhythm with first AV block.  Rate 66.  TTE 06/08/2023: 1. Left ventricular ejection fraction, by estimation, is 65 to 70%. Left  ventricular ejection fraction by 3D volume is 72 %. The left ventricle has  normal function. The left ventricle has no regional wall motion  abnormalities. Left ventricular diastolic   parameters are consistent with Grade II diastolic  dysfunction  (pseudonormalization). The average left ventricular global longitudinal  strain is 24.4 %. The global longitudinal strain is normal.   2. Right ventricular systolic function is normal. The right ventricular  size is mildly enlarged. There is mildly elevated pulmonary artery  systolic pressure.   3. Left atrial size was mildly dilated.   4. The mitral valve is normal in structure. Mild to moderate mitral valve  regurgitation. No evidence of mitral stenosis.   5. Tricuspid valve regurgitation is mild to moderate.   6. The aortic valve is calcified. There is mild thickening of the aortic  valve. Aortic valve regurgitation is not visualized. Mild to moderate  aortic valve stenosis. Aortic valve area, by VTI measures 1.52 cm. Aortic  valve mean gradient measures 18.0  mmHg. Aortic valve Vmax measures 3.03 m/s.   7. There is mild dilatation of the ascending aorta, measuring 38 mm.   8. The inferior vena cava is normal in size with greater than 50%  respiratory variability, suggesting right atrial pressure of 3 mmHg.   Stress echo 09/03/2016: Study Conclusions   - Stress ECG conclusions: There were no stress arrhythmias or    conduction abnormalities. The stress ECG was negative for    ischemia.  - Staged echo: Normal echo stress   Impressions:   - Normal study after pharmacologic stress.     Zannie Cove [facility]/Anesthesiology Phone [phone] 08/11/2023 3:35 PM

## 2023-08-11 NOTE — Anesthesia Preprocedure Evaluation (Addendum)
 Anesthesia Evaluation  Patient identified by MRN, date of birth, ID band Patient awake    Reviewed: Allergy & Precautions, H&P , NPO status , Patient's Chart, lab work & pertinent test results  History of Anesthesia Complications (+) PONV and history of anesthetic complications  Airway Mallampati: III  TM Distance: >3 FB Neck ROM: Full    Dental no notable dental hx. (+) Teeth Intact, Dental Advisory Given   Pulmonary neg pulmonary ROS, former smoker   Pulmonary exam normal breath sounds clear to auscultation       Cardiovascular Exercise Tolerance: Good hypertension, Pt. on medications + CAD and + DOE  + Valvular Problems/Murmurs AS and MR  Rhythm:Regular Rate:Normal     Neuro/Psych negative neurological ROS  negative psych ROS   GI/Hepatic Neg liver ROS, hiatal hernia,GERD  Medicated,,  Endo/Other  diabetes, Insulin Dependent, Oral Hypoglycemic Agents  Class 3 obesity  Renal/GU negative Renal ROS  negative genitourinary   Musculoskeletal  (+) Arthritis , Osteoarthritis,    Abdominal   Peds  Hematology negative hematology ROS (+)   Anesthesia Other Findings   Reproductive/Obstetrics negative OB ROS                             Anesthesia Physical Anesthesia Plan  ASA: 3  Anesthesia Plan: General   Post-op Pain Management: Tylenol PO (pre-op)*   Induction: Intravenous  PONV Risk Score and Plan: 4 or greater and Ondansetron, Dexamethasone and Droperidol  Airway Management Planned: Oral ETT  Additional Equipment: Arterial line  Intra-op Plan:   Post-operative Plan: Extubation in OR  Informed Consent: I have reviewed the patients History and Physical, chart, labs and discussed the procedure including the risks, benefits and alternatives for the proposed anesthesia with the patient or authorized representative who has indicated his/her understanding and acceptance.      Dental advisory given  Plan Discussed with: CRNA  Anesthesia Plan Comments: (PAT note by Antionette Poles, PA-C: 73 year old female follows with cardiology for history of elevated coronary calcium score, echo), mitral valve regurgitation, mild-mod aortic stenosis (mean gradient 18 mmHg by echo 1/25), mildly dilated ascending aorta, HTN, HLD, prior DVT. Cardiac catheterization in 2012 was normal. Prior dobutamine stress echo in 2018 was normal.  Coronary calcium score in 04/2023 was 154 (65th percentile). There was evidence of mild dilation of the proximal ascending aorta, mild aortic root calcification.  Echocardiogram per PCP in 05/2023 showed EF 65 to 70%, normal LV function, no RWMA, G2 DD, normal RV systolic function, mildly dilated left atrium, mild to moderate mitral valve regurgitation, mild to moderate tricuspid valve regurgitation, mild to moderate aortic valve stenosis, mean gradient 18 mmHg, mild dilation of the ascending aorta measuring 38 mm.  She was seen by Bernadene Person, NP on 07/26/2023 for preop evaluation.  Per note, "According to the Revised Cardiac Risk Index (RCRI), her Perioperative Risk of Major Cardiac Event is (%): 0.9. Her Functional Capacity in METs is: 5.62 according to the [facility] Activity Status Index (DASI). Therefore, based on ACC/AHA guidelines, patient would be at acceptable risk for the planned procedure without further cardiovascular testing. I will route this recommendation to the requesting party via Epic fax function."  Other pertinent history includes postoperative nausea and vomiting, hiatal hernia, GERD on H2 blocker, IDDM2.  Preop labs reviewed, unremarkable, A1c 7.0.  EKG 07/26/2023: Sinus rhythm with first AV block.  Rate 66.  TTE 06/08/2023: 1. Left ventricular ejection fraction, by estimation, is  65 to 70%. Left  ventricular ejection fraction by 3D volume is 72 %. The left ventricle has  normal function. The left ventricle has no regional wall motion   abnormalities. Left ventricular diastolic  parameters are consistent with Grade II diastolic dysfunction  (pseudonormalization). The average left ventricular global longitudinal  strain is 24.4 %. The global longitudinal strain is normal.  2. Right ventricular systolic function is normal. The right ventricular  size is mildly enlarged. There is mildly elevated pulmonary artery  systolic pressure.  3. Left atrial size was mildly dilated.  4. The mitral valve is normal in structure. Mild to moderate mitral valve  regurgitation. No evidence of mitral stenosis.  5. Tricuspid valve regurgitation is mild to moderate.  6. The aortic valve is calcified. There is mild thickening of the aortic  valve. Aortic valve regurgitation is not visualized. Mild to moderate  aortic valve stenosis. Aortic valve area, by VTI measures 1.52 cm. Aortic  valve mean gradient measures 18.0  mmHg. Aortic valve Vmax measures 3.03 m/s.  7. There is mild dilatation of the ascending aorta, measuring 38 mm.  8. The inferior vena cava is normal in size with greater than 50%  respiratory variability, suggesting right atrial pressure of 3 mmHg.   Stress echo 09/03/2016: Study Conclusions   - Stress ECG conclusions: There were no stress arrhythmias or  conduction abnormalities. The stress ECG was negative for  ischemia.  - Staged echo: Normal echo stress   Impressions:   - Normal study after pharmacologic stress.   )        Anesthesia Quick Evaluation

## 2023-08-23 ENCOUNTER — Ambulatory Visit (HOSPITAL_COMMUNITY)
Admission: RE | Admit: 2023-08-23 | Discharge: 2023-08-24 | Disposition: A | Discharge status: Home / Self Care | Payer: Medicare Other | Attending: Neurosurgery | Admitting: Neurosurgery

## 2023-08-23 ENCOUNTER — Ambulatory Visit (HOSPITAL_BASED_OUTPATIENT_CLINIC_OR_DEPARTMENT_OTHER)

## 2023-08-23 ENCOUNTER — Encounter (HOSPITAL_COMMUNITY)
Admission: RE | Disposition: A | Discharge status: Home / Self Care | Payer: Self-pay | Source: Home / Self Care | Attending: Neurosurgery

## 2023-08-23 ENCOUNTER — Ambulatory Visit (HOSPITAL_COMMUNITY)

## 2023-08-23 ENCOUNTER — Encounter (HOSPITAL_COMMUNITY): Payer: Self-pay | Admitting: Neurosurgery

## 2023-08-23 ENCOUNTER — Other Ambulatory Visit: Payer: Self-pay

## 2023-08-23 ENCOUNTER — Ambulatory Visit (HOSPITAL_COMMUNITY): Payer: Self-pay | Admitting: Physician Assistant

## 2023-08-23 DIAGNOSIS — Z87891 Personal history of nicotine dependence: Secondary | ICD-10-CM

## 2023-08-23 DIAGNOSIS — I10 Essential (primary) hypertension: Secondary | ICD-10-CM | POA: Insufficient documentation

## 2023-08-23 DIAGNOSIS — M5116 Intervertebral disc disorders with radiculopathy, lumbar region: Secondary | ICD-10-CM | POA: Insufficient documentation

## 2023-08-23 DIAGNOSIS — Z6834 Body mass index (BMI) 34.0-34.9, adult: Secondary | ICD-10-CM | POA: Insufficient documentation

## 2023-08-23 DIAGNOSIS — E119 Type 2 diabetes mellitus without complications: Secondary | ICD-10-CM | POA: Diagnosis not present

## 2023-08-23 DIAGNOSIS — K449 Diaphragmatic hernia without obstruction or gangrene: Secondary | ICD-10-CM | POA: Insufficient documentation

## 2023-08-23 DIAGNOSIS — M48062 Spinal stenosis, lumbar region with neurogenic claudication: Secondary | ICD-10-CM | POA: Insufficient documentation

## 2023-08-23 DIAGNOSIS — M4726 Other spondylosis with radiculopathy, lumbar region: Secondary | ICD-10-CM | POA: Diagnosis not present

## 2023-08-23 DIAGNOSIS — I2510 Atherosclerotic heart disease of native coronary artery without angina pectoris: Secondary | ICD-10-CM | POA: Insufficient documentation

## 2023-08-23 DIAGNOSIS — Z981 Arthrodesis status: Secondary | ICD-10-CM | POA: Diagnosis not present

## 2023-08-23 DIAGNOSIS — Z7984 Long term (current) use of oral hypoglycemic drugs: Secondary | ICD-10-CM | POA: Insufficient documentation

## 2023-08-23 DIAGNOSIS — K219 Gastro-esophageal reflux disease without esophagitis: Secondary | ICD-10-CM | POA: Diagnosis not present

## 2023-08-23 DIAGNOSIS — E66813 Obesity, class 3: Secondary | ICD-10-CM | POA: Diagnosis not present

## 2023-08-23 DIAGNOSIS — Z794 Long term (current) use of insulin: Secondary | ICD-10-CM | POA: Diagnosis not present

## 2023-08-23 DIAGNOSIS — M51372 Other intervertebral disc degeneration, lumbosacral region with discogenic back pain and lower extremity pain: Secondary | ICD-10-CM | POA: Insufficient documentation

## 2023-08-23 DIAGNOSIS — Z79899 Other long term (current) drug therapy: Secondary | ICD-10-CM | POA: Insufficient documentation

## 2023-08-23 DIAGNOSIS — M4316 Spondylolisthesis, lumbar region: Secondary | ICD-10-CM | POA: Diagnosis present

## 2023-08-23 DIAGNOSIS — M5416 Radiculopathy, lumbar region: Secondary | ICD-10-CM | POA: Diagnosis not present

## 2023-08-23 LAB — GLUCOSE, CAPILLARY
Glucose-Capillary: 146 mg/dL — ABNORMAL HIGH (ref 70–99)
Glucose-Capillary: 173 mg/dL — ABNORMAL HIGH (ref 70–99)
Glucose-Capillary: 281 mg/dL — ABNORMAL HIGH (ref 70–99)
Glucose-Capillary: 205 mg/dL — ABNORMAL HIGH (ref 70–99)

## 2023-08-23 SURGERY — POSTERIOR LUMBAR FUSION 1 LEVEL
Anesthesia: General | Site: Spine Lumbar

## 2023-08-23 MED ORDER — ROCURONIUM BROMIDE 10 MG/ML (PF) SYRINGE
PREFILLED_SYRINGE | INTRAVENOUS | Status: AC
  Filled 2023-08-23: qty 10

## 2023-08-23 MED ORDER — LACTATED RINGERS IV SOLN: INTRAVENOUS | Status: DC | PRN

## 2023-08-23 MED ORDER — GABAPENTIN 100 MG PO CAPS
100.0000 mg | ORAL_CAPSULE | Freq: Every day | ORAL | Status: DC
  Administered 2023-08-23: 100 mg via ORAL
  Filled 2023-08-23: qty 1

## 2023-08-23 MED ORDER — INSULIN ASPART 100 UNIT/ML IJ SOLN
0.0000 [IU] | Freq: Every day | INTRAMUSCULAR | Status: DC
  Administered 2023-08-23: 2 [IU] via SUBCUTANEOUS

## 2023-08-23 MED ORDER — ROSUVASTATIN CALCIUM 5 MG PO TABS
5.0000 mg | ORAL_TABLET | Freq: Every day | ORAL | Status: DC
  Administered 2023-08-24: 5 mg via ORAL
  Filled 2023-08-23: qty 1

## 2023-08-23 MED ORDER — BACITRACIN ZINC 500 UNIT/GM EX OINT
TOPICAL_OINTMENT | CUTANEOUS | Status: AC
  Filled 2023-08-23: qty 28.35

## 2023-08-23 MED ORDER — BUPIVACAINE-EPINEPHRINE (PF) 0.5% -1:200000 IJ SOLN
INTRAMUSCULAR | Status: AC
  Filled 2023-08-23: qty 30

## 2023-08-23 MED ORDER — INSULIN ASPART 100 UNIT/ML IJ SOLN: 0.0000 [IU] | INTRAMUSCULAR | Status: DC | PRN

## 2023-08-23 MED ORDER — PROPOFOL 1000 MG/100ML IV EMUL
INTRAVENOUS | Status: AC
  Filled 2023-08-23: qty 100

## 2023-08-23 MED ORDER — IRBESARTAN 150 MG PO TABS
300.0000 mg | ORAL_TABLET | Freq: Every day | ORAL | Status: DC
  Administered 2023-08-23 – 2023-08-24 (×2): 300 mg via ORAL
  Filled 2023-08-23 (×2): qty 2

## 2023-08-23 MED ORDER — FAMOTIDINE 20 MG PO TABS: 10.0000 mg | ORAL_TABLET | Freq: Every day | ORAL | Status: DC | PRN

## 2023-08-23 MED ORDER — ACETAMINOPHEN 325 MG PO TABS: 650.0000 mg | ORAL_TABLET | ORAL | Status: DC | PRN

## 2023-08-23 MED ORDER — ACETAMINOPHEN 650 MG RE SUPP: 650.0000 mg | RECTAL | Status: DC | PRN

## 2023-08-23 MED ORDER — DROPERIDOL 2.5 MG/ML IJ SOLN
INTRAMUSCULAR | Status: AC
  Filled 2023-08-23: qty 2

## 2023-08-23 MED ORDER — HYDROMORPHONE HCL 1 MG/ML IJ SOLN
0.2500 mg | INTRAMUSCULAR | Status: DC | PRN
  Administered 2023-08-23: 0.5 mg via INTRAVENOUS

## 2023-08-23 MED ORDER — SODIUM CHLORIDE 0.9% FLUSH: 3.0000 mL | INTRAVENOUS | Status: DC | PRN

## 2023-08-23 MED ORDER — KETAMINE HCL 50 MG/5ML IJ SOSY
PREFILLED_SYRINGE | INTRAMUSCULAR | Status: AC
  Filled 2023-08-23: qty 5

## 2023-08-23 MED ORDER — THROMBIN 5000 UNITS EX KIT
PACK | CUTANEOUS | Status: AC
  Filled 2023-08-23: qty 1

## 2023-08-23 MED ORDER — FENTANYL CITRATE (PF) 250 MCG/5ML IJ SOLN
INTRAMUSCULAR | Status: DC | PRN
  Administered 2023-08-23: 150 ug via INTRAVENOUS
  Administered 2023-08-23: 50 ug via INTRAVENOUS

## 2023-08-23 MED ORDER — CHLORHEXIDINE GLUCONATE CLOTH 2 % EX PADS: 6.0000 | MEDICATED_PAD | Freq: Once | CUTANEOUS | Status: DC

## 2023-08-23 MED ORDER — PHENOL 1.4 % MT LIQD: 1.0000 | OROMUCOSAL | Status: DC | PRN

## 2023-08-23 MED ORDER — EZETIMIBE 10 MG PO TABS
10.0000 mg | ORAL_TABLET | Freq: Every day | ORAL | Status: DC
  Administered 2023-08-24: 10 mg via ORAL
  Filled 2023-08-23: qty 1

## 2023-08-23 MED ORDER — INSULIN ASPART 100 UNIT/ML IJ SOLN
0.0000 [IU] | Freq: Three times a day (TID) | INTRAMUSCULAR | Status: DC
  Administered 2023-08-23: 8 [IU] via SUBCUTANEOUS
  Administered 2023-08-24: 2 [IU] via SUBCUTANEOUS

## 2023-08-23 MED ORDER — ALBUMIN HUMAN 5 % IV SOLN: INTRAVENOUS | Status: DC | PRN

## 2023-08-23 MED ORDER — FENTANYL CITRATE (PF) 250 MCG/5ML IJ SOLN
INTRAMUSCULAR | Status: AC
  Filled 2023-08-23: qty 5

## 2023-08-23 MED ORDER — MENTHOL 3 MG MT LOZG: 1.0000 | LOZENGE | OROMUCOSAL | Status: DC | PRN

## 2023-08-23 MED ORDER — CEFAZOLIN SODIUM-DEXTROSE 2-4 GM/100ML-% IV SOLN
2.0000 g | Freq: Three times a day (TID) | INTRAVENOUS | Status: AC
  Administered 2023-08-23 (×2): 2 g via INTRAVENOUS
  Filled 2023-08-23 (×2): qty 100

## 2023-08-23 MED ORDER — PROPOFOL 10 MG/ML IV BOLUS
INTRAVENOUS | Status: DC | PRN
  Administered 2023-08-23: 100 mg via INTRAVENOUS

## 2023-08-23 MED ORDER — ONDANSETRON HCL 4 MG PO TABS: 4.0000 mg | ORAL_TABLET | Freq: Four times a day (QID) | ORAL | Status: DC | PRN

## 2023-08-23 MED ORDER — PROPOFOL 500 MG/50ML IV EMUL
INTRAVENOUS | Status: DC | PRN
  Administered 2023-08-23: 20 ug/kg/min via INTRAVENOUS

## 2023-08-23 MED ORDER — OXYCODONE HCL 5 MG PO TABS
10.0000 mg | ORAL_TABLET | ORAL | Status: DC | PRN
  Administered 2023-08-23: 10 mg via ORAL
  Filled 2023-08-23 (×2): qty 2

## 2023-08-23 MED ORDER — DEXAMETHASONE SODIUM PHOSPHATE 10 MG/ML IJ SOLN
INTRAMUSCULAR | Status: DC | PRN
  Administered 2023-08-23: 5 mg via INTRAVENOUS

## 2023-08-23 MED ORDER — OXYCODONE HCL 5 MG PO TABS
5.0000 mg | ORAL_TABLET | ORAL | Status: DC | PRN
  Administered 2023-08-23 – 2023-08-24 (×3): 5 mg via ORAL
  Filled 2023-08-23 (×2): qty 1

## 2023-08-23 MED ORDER — ACETAMINOPHEN 500 MG PO TABS
1000.0000 mg | ORAL_TABLET | Freq: Four times a day (QID) | ORAL | Status: AC
  Administered 2023-08-23 – 2023-08-24 (×4): 1000 mg via ORAL
  Filled 2023-08-23 (×4): qty 2

## 2023-08-23 MED ORDER — ACETAMINOPHEN 500 MG PO TABS
1000.0000 mg | ORAL_TABLET | Freq: Once | ORAL | Status: AC
  Administered 2023-08-23: 1000 mg via ORAL
  Filled 2023-08-23: qty 2

## 2023-08-23 MED ORDER — MORPHINE SULFATE (PF) 2 MG/ML IV SOLN: 2.0000 mg | INTRAVENOUS | Status: DC | PRN

## 2023-08-23 MED ORDER — CEFAZOLIN SODIUM-DEXTROSE 2-4 GM/100ML-% IV SOLN
2.0000 g | INTRAVENOUS | Status: AC
Start: 2023-08-23 — End: 2023-08-23
  Administered 2023-08-23: 2 g via INTRAVENOUS
  Filled 2023-08-23: qty 100

## 2023-08-23 MED ORDER — PHENYLEPHRINE HCL-NACL 20-0.9 MG/250ML-% IV SOLN
INTRAVENOUS | Status: DC | PRN
  Administered 2023-08-23: 30 ug/min via INTRAVENOUS

## 2023-08-23 MED ORDER — LIDOCAINE 2% (20 MG/ML) 5 ML SYRINGE
INTRAMUSCULAR | Status: DC | PRN
  Administered 2023-08-23: 40 mg via INTRAVENOUS

## 2023-08-23 MED ORDER — THROMBIN 5000 UNITS EX SOLR
OROMUCOSAL | Status: DC | PRN
  Administered 2023-08-23: 5 mL via TOPICAL

## 2023-08-23 MED ORDER — SODIUM CHLORIDE 0.9 % IV SOLN: 250.0000 mL | INTRAVENOUS | Status: DC

## 2023-08-23 MED ORDER — SUGAMMADEX SODIUM 200 MG/2ML IV SOLN
INTRAVENOUS | Status: DC | PRN
  Administered 2023-08-23: 200 mg via INTRAVENOUS

## 2023-08-23 MED ORDER — HYDROCHLOROTHIAZIDE 25 MG PO TABS
25.0000 mg | ORAL_TABLET | Freq: Every day | ORAL | Status: DC
  Administered 2023-08-23 – 2023-08-24 (×2): 25 mg via ORAL
  Filled 2023-08-23 (×2): qty 1

## 2023-08-23 MED ORDER — LIDOCAINE 2% (20 MG/ML) 5 ML SYRINGE
INTRAMUSCULAR | Status: AC
Start: 2023-08-23 — End: ?
  Filled 2023-08-23: qty 5

## 2023-08-23 MED ORDER — HYDROMORPHONE HCL 1 MG/ML IJ SOLN
INTRAMUSCULAR | Status: AC
Start: 2023-08-23 — End: ?
  Filled 2023-08-23: qty 0.5

## 2023-08-23 MED ORDER — PROPOFOL 10 MG/ML IV BOLUS
INTRAVENOUS | Status: AC
  Filled 2023-08-23: qty 20

## 2023-08-23 MED ORDER — ROCURONIUM BROMIDE 10 MG/ML (PF) SYRINGE
PREFILLED_SYRINGE | INTRAVENOUS | Status: DC | PRN
  Administered 2023-08-23: 60 mg via INTRAVENOUS
  Administered 2023-08-23 (×2): 10 mg via INTRAVENOUS
  Administered 2023-08-23: 20 mg via INTRAVENOUS
  Administered 2023-08-23: 10 mg via INTRAVENOUS

## 2023-08-23 MED ORDER — HYDROMORPHONE HCL 1 MG/ML IJ SOLN
INTRAMUSCULAR | Status: AC
  Filled 2023-08-23: qty 1

## 2023-08-23 MED ORDER — TIZANIDINE HCL 4 MG PO TABS: 4.0000 mg | ORAL_TABLET | Freq: Three times a day (TID) | ORAL | Status: DC | PRN

## 2023-08-23 MED ORDER — ONDANSETRON HCL 4 MG/2ML IJ SOLN
INTRAMUSCULAR | Status: DC | PRN
  Administered 2023-08-23: 4 mg via INTRAVENOUS

## 2023-08-23 MED ORDER — VASHE WOUND IRRIGATION OPTIME
TOPICAL | Status: DC | PRN
  Administered 2023-08-23: 34 [oz_av] via TOPICAL

## 2023-08-23 MED ORDER — HYDROMORPHONE HCL 1 MG/ML IJ SOLN
INTRAMUSCULAR | Status: DC | PRN
  Administered 2023-08-23: .25 mg via INTRAVENOUS

## 2023-08-23 MED ORDER — SUCCINYLCHOLINE CHLORIDE 200 MG/10ML IV SOSY
PREFILLED_SYRINGE | INTRAVENOUS | Status: AC
  Filled 2023-08-23: qty 10

## 2023-08-23 MED ORDER — LACTATED RINGERS IV SOLN: INTRAVENOUS | Status: DC

## 2023-08-23 MED ORDER — GABAPENTIN 300 MG PO CAPS
600.0000 mg | ORAL_CAPSULE | Freq: Every day | ORAL | Status: DC
  Administered 2023-08-23: 600 mg via ORAL
  Filled 2023-08-23: qty 2

## 2023-08-23 MED ORDER — DOCUSATE SODIUM 100 MG PO CAPS
100.0000 mg | ORAL_CAPSULE | Freq: Two times a day (BID) | ORAL | Status: DC
  Administered 2023-08-23 – 2023-08-24 (×3): 100 mg via ORAL
  Filled 2023-08-23 (×3): qty 1

## 2023-08-23 MED ORDER — PHENYLEPHRINE 80 MCG/ML (10ML) SYRINGE FOR IV PUSH (FOR BLOOD PRESSURE SUPPORT)
PREFILLED_SYRINGE | INTRAVENOUS | Status: AC
Start: 2023-08-23 — End: ?
  Filled 2023-08-23: qty 10

## 2023-08-23 MED ORDER — ONDANSETRON HCL 4 MG/2ML IJ SOLN
INTRAMUSCULAR | Status: AC
Start: 2023-08-23 — End: ?
  Filled 2023-08-23: qty 2

## 2023-08-23 MED ORDER — 0.9 % SODIUM CHLORIDE (POUR BTL) OPTIME
TOPICAL | Status: DC | PRN
  Administered 2023-08-23: 1000 mL

## 2023-08-23 MED ORDER — BACITRACIN ZINC 500 UNIT/GM EX OINT
TOPICAL_OINTMENT | CUTANEOUS | Status: DC | PRN
  Administered 2023-08-23: 1 via TOPICAL

## 2023-08-23 MED ORDER — ZOLPIDEM TARTRATE 5 MG PO TABS: 5.0000 mg | ORAL_TABLET | Freq: Every evening | ORAL | Status: DC | PRN

## 2023-08-23 MED ORDER — DROPERIDOL 2.5 MG/ML IJ SOLN
INTRAMUSCULAR | Status: DC | PRN
  Administered 2023-08-23: .625 mg via INTRAVENOUS

## 2023-08-23 MED ORDER — ORAL CARE MOUTH RINSE: 15.0000 mL | Freq: Once | OROMUCOSAL | Status: AC

## 2023-08-23 MED ORDER — ONDANSETRON HCL 4 MG/2ML IJ SOLN: 4.0000 mg | Freq: Four times a day (QID) | INTRAMUSCULAR | Status: DC | PRN

## 2023-08-23 MED ORDER — BISACODYL 10 MG RE SUPP: 10.0000 mg | Freq: Every day | RECTAL | Status: DC | PRN

## 2023-08-23 MED ORDER — KETAMINE HCL 50 MG/ML IJ SOLN
INTRAMUSCULAR | Status: DC | PRN
Start: 2023-08-23 — End: 2023-08-23
  Administered 2023-08-23 (×2): 10 mg via INTRAMUSCULAR
  Administered 2023-08-23: 30 mg via INTRAMUSCULAR

## 2023-08-23 MED ORDER — BUPIVACAINE LIPOSOME 1.3 % IJ SUSP
INTRAMUSCULAR | Status: AC
Start: 2023-08-23 — End: ?
  Filled 2023-08-23: qty 20

## 2023-08-23 MED ORDER — CHLORHEXIDINE GLUCONATE 0.12 % MT SOLN
15.0000 mL | Freq: Once | OROMUCOSAL | Status: AC
  Administered 2023-08-23: 15 mL via OROMUCOSAL
  Filled 2023-08-23: qty 15

## 2023-08-23 MED ORDER — BUPIVACAINE-EPINEPHRINE (PF) 0.5% -1:200000 IJ SOLN
INTRAMUSCULAR | Status: DC | PRN
  Administered 2023-08-23: 10 mL via PERINEURAL

## 2023-08-23 MED ORDER — EPHEDRINE SULFATE-NACL 50-0.9 MG/10ML-% IV SOSY
PREFILLED_SYRINGE | INTRAVENOUS | Status: DC | PRN
  Administered 2023-08-23: 10 mg via INTRAVENOUS

## 2023-08-23 MED ORDER — ACETAMINOPHEN 10 MG/ML IV SOLN
INTRAVENOUS | Status: AC
  Filled 2023-08-23: qty 100

## 2023-08-23 MED ORDER — DEXAMETHASONE SODIUM PHOSPHATE 10 MG/ML IJ SOLN
INTRAMUSCULAR | Status: AC
Start: 2023-08-23 — End: ?
  Filled 2023-08-23: qty 1

## 2023-08-23 MED ORDER — BUPIVACAINE LIPOSOME 1.3 % IJ SUSP
INTRAMUSCULAR | Status: DC | PRN
  Administered 2023-08-23: 20 mL

## 2023-08-23 MED ORDER — SODIUM CHLORIDE 0.9% FLUSH
3.0000 mL | Freq: Two times a day (BID) | INTRAVENOUS | Status: DC
  Administered 2023-08-23: 3 mL via INTRAVENOUS

## 2023-08-23 SURGICAL SUPPLY — 60 items
BAG COUNTER SPONGE SURGICOUNT (BAG) ×1 IMPLANT
BASKET BONE COLLECTION (BASKET) ×1 IMPLANT
BENZOIN TINCTURE PRP APPL 2/3 (GAUZE/BANDAGES/DRESSINGS) ×1 IMPLANT
BLADE CLIPPER SURG (BLADE) IMPLANT
BUR MATCHSTICK NEURO 3.0 LAGG (BURR) ×1 IMPLANT
BUR PRECISION FLUTE 6.0 (BURR) ×1 IMPLANT
CAGE ALTERA 10X31X9-13 15D (Cage) IMPLANT
CANISTER SUCT 3000ML PPV (MISCELLANEOUS) ×1 IMPLANT
CAP LOCK DLX THRD (Cap) IMPLANT
CLEANSER WND VASHE INSTL 34OZ (WOUND CARE) ×1 IMPLANT
CNTNR URN SCR LID CUP LEK RST (MISCELLANEOUS) ×1 IMPLANT
COVER BACK TABLE 60X90IN (DRAPES) ×1 IMPLANT
DRAPE C-ARM 42X72 X-RAY (DRAPES) ×2 IMPLANT
DRAPE HALF SHEET 40X57 (DRAPES) ×1 IMPLANT
DRAPE LAPAROTOMY 100X72X124 (DRAPES) ×1 IMPLANT
DRAPE SURG 17X23 STRL (DRAPES) ×1 IMPLANT
DRSG OPSITE POSTOP 4X6 (GAUZE/BANDAGES/DRESSINGS) ×1 IMPLANT
DRSG TEGADERM 4X4.5 CHG (GAUZE/BANDAGES/DRESSINGS) IMPLANT
ELECT BLADE 4.0 EZ CLEAN MEGAD (MISCELLANEOUS) ×1 IMPLANT
ELECT REM PT RETURN 9FT ADLT (ELECTROSURGICAL) ×1 IMPLANT
ELECTRODE BLDE 4.0 EZ CLN MEGD (MISCELLANEOUS) ×1 IMPLANT
ELECTRODE REM PT RTRN 9FT ADLT (ELECTROSURGICAL) ×1 IMPLANT
EVACUATOR 1/8 PVC DRAIN (DRAIN) IMPLANT
GAUZE 4X4 16PLY ~~LOC~~+RFID DBL (SPONGE) ×1 IMPLANT
GLOVE BIO SURGEON STRL SZ 6 (GLOVE) ×1 IMPLANT
GLOVE BIO SURGEON STRL SZ8 (GLOVE) ×2 IMPLANT
GLOVE BIO SURGEON STRL SZ8.5 (GLOVE) ×2 IMPLANT
GLOVE BIOGEL PI IND STRL 6.5 (GLOVE) ×1 IMPLANT
GLOVE EXAM NITRILE XL STR (GLOVE) IMPLANT
GOWN STRL REUS W/ TWL LRG LVL3 (GOWN DISPOSABLE) ×1 IMPLANT
GOWN STRL REUS W/ TWL XL LVL3 (GOWN DISPOSABLE) ×2 IMPLANT
GOWN STRL REUS W/TWL 2XL LVL3 (GOWN DISPOSABLE) IMPLANT
HEMOSTAT POWDER KIT SURGIFOAM (HEMOSTASIS) ×1 IMPLANT
KIT BASIN OR (CUSTOM PROCEDURE TRAY) ×1 IMPLANT
KIT GRAFTMAG DEL NEURO DISP (NEUROSURGERY SUPPLIES) IMPLANT
KIT POSITION SURG JACKSON T1 (MISCELLANEOUS) ×1 IMPLANT
KIT TURNOVER KIT B (KITS) ×1 IMPLANT
NDL HYPO 21X1.5 SAFETY (NEEDLE) ×1 IMPLANT
NDL HYPO 22X1.5 SAFETY MO (MISCELLANEOUS) ×1 IMPLANT
NEEDLE HYPO 21X1.5 SAFETY (NEEDLE) ×1 IMPLANT
NEEDLE HYPO 22X1.5 SAFETY MO (MISCELLANEOUS) ×1 IMPLANT
NS IRRIG 1000ML POUR BTL (IV SOLUTION) ×1 IMPLANT
PACK LAMINECTOMY NEURO (CUSTOM PROCEDURE TRAY) ×1 IMPLANT
PAD ARMBOARD POSITIONER FOAM (MISCELLANEOUS) ×3 IMPLANT
PATTIES SURGICAL .5 X1 (DISPOSABLE) IMPLANT
PUTTY DBM 10CC CALC GRAN (Putty) IMPLANT
ROD CURVED TI 6.35X45 (Rod) IMPLANT
SCREW PA DLX CREO 7.5X50 (Screw) IMPLANT
SPIKE FLUID TRANSFER (MISCELLANEOUS) ×1 IMPLANT
SPONGE NEURO XRAY DETECT 1X3 (DISPOSABLE) IMPLANT
SPONGE SURGIFOAM ABS GEL 100 (HEMOSTASIS) IMPLANT
SPONGE T-LAP 4X18 ~~LOC~~+RFID (SPONGE) IMPLANT
STRIP CLOSURE SKIN 1/2X4 (GAUZE/BANDAGES/DRESSINGS) ×1 IMPLANT
SUT VIC AB 1 CT1 18XBRD ANBCTR (SUTURE) ×2 IMPLANT
SUT VIC AB 2-0 CP2 18 (SUTURE) ×2 IMPLANT
SYR 20ML LL LF (SYRINGE) IMPLANT
TOWEL GREEN STERILE (TOWEL DISPOSABLE) ×1 IMPLANT
TOWEL GREEN STERILE FF (TOWEL DISPOSABLE) ×1 IMPLANT
TRAY FOLEY MTR SLVR 16FR STAT (SET/KITS/TRAYS/PACK) ×1 IMPLANT
WATER STERILE IRR 1000ML POUR (IV SOLUTION) ×1 IMPLANT

## 2023-08-23 NOTE — Progress Notes (Signed)
 Orthopedic Tech Progress Note Patient Details:  Heather [name] June 29, 1950 [id number]  RN stated patient has back brace   Patient ID: Heather [name], female   DOB: [birth year], 73 y.o.   MRN: [id number]  Heather [name] 08/23/2023, 12:24 PM

## 2023-08-23 NOTE — H&P (Signed)
 Subjective: The patient is a 73 year old white female who is complaining of back and right greater left leg pain consistent with neurogenic claudication/lumbar radiculopathy.  She has failed medical management and was worked up with lumbar x-rays and lumbar MRI which demonstrated L4-5 spondylolisthesis, spinal stenosis, etc.  I discussed the various treatment options with her.  She has decided proceed with surgery.  Past Medical History:  Diagnosis Date   Aortic stenosis    Arthritis    Colon polyps    adenomas- took 1/3 of colon   Coronary artery disease    Diabetes mellitus without complication (HCC)    Environmental allergies    GERD (gastroesophageal reflux disease)    Heart murmur    since birth never a problem per pt   History of DVT (deep vein thrombosis) 01/16/1969   History of hiatal hernia    History of transfusion    Hyperlipidemia    Hypertension    Interstitial cystitis    Dr Brunilda Payor   PONV (postoperative nausea and vomiting)     Past Surgical History:  Procedure Laterality Date   APPENDECTOMY     BILATERAL SALPINGOOPHORECTOMY     painful cysts   CATARACT EXTRACTION, BILATERAL     COLONOSCOPY  2005   negative; Dr Kinnie Scales. F/U declined   CYSTOSCOPY     X 4; Dr Brunilda Payor   INSERTION OF MESH N/A 09/17/2021   Procedure: INSERTION OF MESH;  Surgeon: Karie Soda, MD;  Location: [facility];  Service: General;  Laterality: N/A;   LAPAROSCOPIC RIGHT COLECTOMY Right 08/19/2015   Procedure: LAPAROSCOPIC ASSISTED  RIGHT COLECTOMY;  Surgeon: Ovidio Kin, MD;  Location: [facility];  Service: General;  Laterality: Right;   LYSIS OF ADHESION N/A 09/17/2021   Procedure: LYSIS OF ADHESION;  Surgeon: Karie Soda, MD;  Location: [facility];  Service: General;  Laterality: N/A;   PREMAGLINANT POLYPS     TOTAL ABDOMINAL HYSTERECTOMY     metromenorrhagia   VENTRAL HERNIA REPAIR N/A 09/17/2021   Procedure: LAPAROSCOPIC VENTRAL WALL HERNIA REPAIR BILATERAL TAP BLOCK;  Surgeon: Karie Soda, MD;  Location:  [facility];  Service: General;  Laterality: N/A;    Allergies  Allergen Reactions   Tramadol Nausea And Vomiting   Farxiga [Dapagliflozin]     Constant Vaginal Infections   Flagyl [Metronidazole] Other (See Comments)    Made really sick. Was able to tolerate dose 4/17 with zofran   Iodine Other (See Comments)    breaks out in blisters if stays on skin   Jardiance [Empagliflozin]     Constant Vaginal infections   Lisinopril     04/03/15 cough reported X several months   Cholestyramine Other (See Comments)    Joint pain   Metformin And Related Other (See Comments)    Loose stools   Prandin [Repaglinide] Nausea Only    Lowered Blood Sugar    Sulfonamide Derivatives Nausea Only    Social History   Tobacco Use   Smoking status: Former    Current packs/day: 0.00    Types: Cigarettes    Quit date: 05/18/1984    Years since quitting: 39.2   Smokeless tobacco: Never   Tobacco comments:    smoked 1972-1986, up to 1 ppd  Substance Use Topics   Alcohol use: No    Alcohol/week: 0.0 standard drinks of alcohol    Family History  Problem Relation Age of Onset   Heart attack Mother        in 36s   Hypertension  Mother    Stroke Mother         in 76s   Other Father        Deceased, 45   Breast cancer Sister    Healthy Brother    Healthy Daughter    Healthy Son    Stroke Maternal Grandfather 50   Heart attack Paternal Grandfather        in 62s   Diabetes Neg Hx    Prior to Admission medications   Medication Sig Start Date End Date Taking? Authorizing Provider  Dulaglutide (TRULICITY) 1.5 MG/0.5ML SOAJ INJECT 1.5 MG INTO THE SKIN ONCE A WEEK 06/30/23  Yes Burns, Bobette Mo, MD  ezetimibe (ZETIA) 10 MG tablet TAKE 1 TABLET BY MOUTH EVERY DAY 01/28/23  Yes Burns, Bobette Mo, MD  famotidine (PEPCID) 10 MG tablet Take 10 mg by mouth daily as needed for heartburn or indigestion.   Yes [provider]  gabapentin (NEURONTIN) 100 MG capsule Take 1 capsule (100 mg total) by mouth at  bedtime. 04/30/23  Yes Burns, Bobette Mo, MD  gabapentin (NEURONTIN) 600 MG tablet Take 1 tablet (600 mg total) by mouth at bedtime. 04/30/23  Yes Burns, Bobette Mo, MD  hydrochlorothiazide (HYDRODIURIL) 25 MG tablet Take 1 tablet (25 mg total) by mouth daily. 04/30/23  Yes Burns, Bobette Mo, MD  HYDROcodone-acetaminophen (NORCO/VICODIN) 5-325 MG tablet Take 1 tablet by mouth every 8 (eight) hours as needed for moderate pain (pain score 4-6). 07/08/23  Yes Kirsteins, Victorino Sparrow, MD  ibuprofen (ADVIL) 200 MG tablet Take 2 tablets (400 mg total) by mouth every 8 (eight) hours as needed for moderate pain (pain score 4-6). 04/30/23  Yes Burns, Bobette Mo, MD  insulin lispro (HUMALOG KWIKPEN) 100 UNIT/ML KwikPen Using as directed with SS   Inject 4-10 units into the skin 3 times a day before meals prn   < 150  0 units 151-200  2 units 201-250  4 units   >250      8 units 04/30/23  Yes Burns, Bobette Mo, MD  LANTUS SOLOSTAR 100 UNIT/ML Solostar Pen Inject 10 Units into the skin in the morning. 04/30/23  Yes Burns, Bobette Mo, MD  olmesartan (BENICAR) 40 MG tablet Take 1 tablet (40 mg total) by mouth daily. 07/26/23  Yes Monge, Petra Kuba, NP  ondansetron (ZOFRAN-ODT) 4 MG disintegrating tablet Take 1 tablet (4 mg total) by mouth every 8 (eight) hours as needed for nausea or vomiting. Please schedule an office visit for further refills. 04/30/23  Yes Burns, Bobette Mo, MD  rosuvastatin (CRESTOR) 5 MG tablet Take 1 tablet (5 mg total) by mouth daily. 06/09/23  Yes Burns, Bobette Mo, MD  tiZANidine (ZANAFLEX) 4 MG tablet Take 1 tablet (4 mg total) by mouth every 8 (eight) hours as needed (back pain, muscle spasms). 04/30/23  Yes Burns, Bobette Mo, MD  BD PEN NEEDLE MICRO U/F 32G X 6 MM MISC Inject into the skin as directed. 04/30/23   Pincus Sanes, MD  Blood Glucose Monitoring Suppl (ONE TOUCH ULTRA 2) w/Device KIT 1 each by Does not apply route 4 (four) times daily. Use device to monitor glucose levels 4 times per day; E11.9 05/06/20   Romero Belling, MD  diclofenac Sodium (VOLTAREN) 1 % GEL APPLY 4 GRAMS TO AFFECTED JOINTS 4 TIMES A DAY Patient not taking: Reported on 08/06/2023 12/11/20   Rodolph Bong, MD  Lancets [facility] DELICA PLUS LANCET33G) MISC USE TO TEST BLOOD SUGARS ONCE  DAILY 12/31/22  Pincus Sanes, MD  [facility] ULTRA test strip TEST ONCE DAILY 07/27/23   Pincus Sanes, MD     Review of Systems  Positive ROS: As above  All other systems have been reviewed and were otherwise negative with the exception of those mentioned in the HPI and as above.  Objective: Vital signs in last 24 hours: Temp:  [98.9 F (37.2 C)] 98.9 F (37.2 C) (04/07 0559) Pulse Rate:  [74] 74 (04/07 0559) Resp:  [18] 18 (04/07 0559) BP: (127)/(78) 127/78 (04/07 0559) SpO2:  [96 %] 96 % (04/07 0559) Weight:  [79.4 kg] 79.4 kg (04/07 0559) Estimated body mass index is 34.18 kg/m as calculated from the following:   Height as of this encounter: 5' (1.524 m).   Weight as of this encounter: 79.4 kg.   General Appearance: Alert Head: Normocephalic, without obvious abnormality, atraumatic Eyes: PERRL, conjunctiva/corneas clear, EOM's intact,    Ears: Normal  Throat: Normal  Neck: Supple, Back: unremarkable Lungs: Clear to auscultation bilaterally, respirations unlabored Heart: Regular rate and rhythm, no murmur, rub or gallop Abdomen: Soft, non-tender Extremities: Extremities normal, atraumatic, no cyanosis or edema Skin: unremarkable  NEUROLOGIC:   Mental status: alert and oriented,Motor Exam - grossly normal Sensory Exam - grossly normal Reflexes:  Coordination - grossly normal Gait - grossly normal Balance - grossly normal Cranial Nerves: I: smell Not tested  II: visual acuity  OS: Normal  OD: Normal   II: visual fields Full to confrontation  II: pupils Equal, round, reactive to light  III,VII: ptosis None  III,IV,VI: extraocular muscles  Full ROM  V: mastication Normal  V: facial light touch sensation  Normal  V,VII:  corneal reflex  Present  VII: facial muscle function - upper  Normal  VII: facial muscle function - lower Normal  VIII: hearing Not tested  IX: soft palate elevation  Normal  IX,X: gag reflex Present  XI: trapezius strength  5/5  XI: sternocleidomastoid strength 5/5  XI: neck flexion strength  5/5  XII: tongue strength  Normal    Data Review Lab Results  Component Value Date   WBC 7.5 08/10/2023   HGB 13.0 08/10/2023   HCT 39.7 08/10/2023   MCV 79.9 (L) 08/10/2023   PLT 314 08/10/2023   Lab Results  Component Value Date   NA 137 08/10/2023   K 4.2 08/10/2023   CL 102 08/10/2023   CO2 26 08/10/2023   BUN 13 08/10/2023   CREATININE 0.90 08/10/2023   GLUCOSE 108 (H) 08/10/2023   No results found for: "INR", "PROTIME"  Assessment/Plan: Lumbar spondylolisthesis, lumbar facet arthropathy, lumbar spinal stenosis, neurogenic claudication, lumbago, lumbar radiculopathy: I have discussed the situation with the patient.  I reviewed her imaging studies with her and pointed out the abnormalities.  We have discussed the various treatment options including surgery.  I have described the surgical treatment option of an L4-5 decompression, instrumentation and fusion.  I have shown her surgical models.  I have given her a surgical pamphlet.  We have discussed the risk, benefits, alternatives, expected postop course, and likelihood of achieving our goals with surgery.  I have answered all her questions.  She has decided proceed with surgery.   Cristi Loron 08/23/2023 7:29 AM

## 2023-08-23 NOTE — Anesthesia Procedure Notes (Signed)
 Arterial Line Insertion Start/End4/11/2023 6:55 AM, 08/23/2023 6:58 AM Performed by: Marcene Duos, MD  Patient location: Pre-op. Preanesthetic checklist: patient identified, IV checked, site marked, risks and benefits discussed, surgical consent, monitors and equipment checked, pre-op evaluation, timeout performed and anesthesia consent Lidocaine 1% used for infiltration Left, radial was placed Catheter size: 20 G Hand hygiene performed , maximum sterile barriers used  and Seldinger technique used  Attempts: 1 Procedure performed using ultrasound guided technique. Ultrasound Notes:anatomy identified, needle tip was noted to be adjacent to the nerve/plexus identified and no ultrasound evidence of intravascular and/or intraneural injection Following insertion, dressing applied. Post procedure assessment: normal and unchanged  Patient tolerated the procedure well with no immediate complications.

## 2023-08-23 NOTE — Anesthesia Procedure Notes (Signed)
 Procedure Name: Intubation Date/Time: 08/23/2023 7:50 AM  Performed by: Carolynne Edouard, RNPre-anesthesia Checklist: Patient identified, Emergency Drugs available, Suction available and Patient being monitored Patient Re-evaluated:Patient Re-evaluated prior to induction Oxygen Delivery Method: Circle system utilized Preoxygenation: Pre-oxygenation with 100% oxygen Induction Type: IV induction Ventilation: Mask ventilation without difficulty Laryngoscope Size: Mac and 3 Grade View: Grade I Tube type: Oral Tube size: 7.0 mm Number of attempts: 1 Airway Equipment and Method: Stylet and Oral airway Placement Confirmation: ETT inserted through vocal cords under direct vision, positive ETCO2 and breath sounds checked- equal and bilateral Secured at: 22 cm Tube secured with: Tape Dental Injury: Teeth and Oropharynx as per pre-operative assessment

## 2023-08-23 NOTE — Anesthesia Postprocedure Evaluation (Signed)
 Anesthesia Post Note  Patient: Heather [name]  Procedure(s) Performed: Posterior Lumbar Interbody Fusion,Interbody Prosthesis ,Posteror  Instrumentation , Lumbar four-five (Spine Lumbar)     Patient location during evaluation: PACU Anesthesia Type: General Level of consciousness: awake and alert Pain management: pain level controlled Vital Signs Assessment: post-procedure vital signs reviewed and stable Respiratory status: spontaneous breathing, nonlabored ventilation, respiratory function stable and patient connected to nasal cannula oxygen Cardiovascular status: blood pressure returned to baseline and stable Postop Assessment: no apparent nausea or vomiting Anesthetic complications: no  No notable events documented.  Last Vitals:  Vitals:   08/23/23 1230 08/23/23 1245  BP: 120/64 125/68  Pulse: 72 68  Resp: 13 13  Temp:  36.4 C  SpO2: 95% 95%    Last Pain:  Vitals:   08/23/23 1245  PainSc: Asleep    LLE Motor Response: Purposeful movement (08/23/23 1255)   RLE Motor Response: Purposeful movement (08/23/23 1255)        Matison Nuccio,W. EDMOND

## 2023-08-23 NOTE — Op Note (Signed)
 Brief history: The patient is a 72 year old white female who is complaining of back and right greater than left leg pain consistent with lumbar radiculopathy/neurogenic claudication.  She failed medical management and was worked up with lumbar x-rays and lumbar MRI which demonstrated an L4-5 spondylolisthesis and spinal stenosis.  I discussed the various treatment options with the patient.  She has decided proceed with surgery.  Preoperative diagnosis: Lumbar spondylolisthesis, send arthropathy, degenerative disc disease, spinal stenosis compressing both the L4 and the L5 nerve roots; lumbago; lumbar radiculopathy; neurogenic claudication  Postoperative diagnosis: The same  Procedure: Bilateral L4-5 laminotomy/foraminotomies/medial facetectomy to decompress the bilateral L4 and L5 nerve roots(the work required to do this was in addition to the work required to do the posterior lumbar interbody fusion because of the patient's spinal stenosis, facet arthropathy. Etc. requiring a wide decompression of the nerve roots.); right L4-5 transforaminal lumbar interbody fusion with local morselized autograft bone and Zimmer DBM; insertion of interbody prosthesis at L4-5 (globus peek expandable interbody prosthesis); posterior L4-5 instrumentation from L4 to L5 with globus titanium pedicle screws and rods; posterior lateral arthrodesis at L4-5 with local morselized autograft bone and Zimmer DBM.  Surgeon: Dr. Delma Officer  Asst.: Dr. Coletta Memos and Hildred Priest, NP  Anesthesia: Gen. endotracheal  Estimated blood loss: 200 cc  Drains: None  Complications: None  Description of procedure: The patient was brought to the operating room by the anesthesia team. General endotracheal anesthesia was induced. The patient was turned to the prone position on the Wilson frame. The patient's lumbosacral region was then prepared with Betadine scrub and Betadine solution. Sterile drapes were applied.  I then injected  the area to be incised with Marcaine with epinephrine solution. I then used the scalpel to make a linear midline incision over the L4-5 interspace. I then used electrocautery to perform a bilateral subperiosteal dissection exposing the spinous process and lamina of L4-5. We then obtained intraoperative radiograph to confirm our location. We then inserted the Verstrac retractor to provide exposure.  I began the decompression by using the high speed drill to perform laminotomies at L4-5 bilaterally. We then used the Kerrison punches to widen the laminotomy and removed the ligamentum flavum at L4-5 bilaterally. We used the Kerrison punches to remove the medial facets at L4-5 bilaterally, we removed the right L4-5 facet. We performed wide foraminotomies about the bilateral L4 and L5 nerve roots completing the decompression.  We now turned our attention to the posterior lumbar interbody fusion. I used a scalpel to incise the intervertebral disc at L4-5 bilaterally. I then performed a partial intervertebral discectomy at L4-5 bilaterally using the pituitary forceps. We prepared the vertebral endplates at L4-5 bilaterally for the fusion by removing the soft tissues with the curettes. We then used the trial spacers to pick the appropriate sized interbody prosthesis. We prefilled his prosthesis with a combination of local morselized autograft bone that we obtained during the decompression as well as Zimmer DBM. We inserted the prefilled prosthesis into the interspace at L4-5 from the right, we then turned and expanded the prosthesis. There was a good snug fit of the prosthesis in the interspace. We then filled and the remainder of the intervertebral disc space with local morselized autograft bone and Zimmer DBM. This completed the posterior lumbar interbody arthrodesis.  During the decompression and insertion of the prosthesis the assistant protected the thecal sac and nerve roots with the D'Errico retractor.  We now  turned attention to the instrumentation. Under fluoroscopic guidance  we cannulated the bilateral L4 and L5 pedicles with the bone probe. We then removed the bone probe. We then tapped the pedicle with a 6.5 millimeter tap. We then removed the tap. We probed inside the tapped pedicle with a ball probe to rule out cortical breaches. We then inserted a 7.5 x 50 millimeter pedicle screw into the L4 and L5 pedicles bilaterally under fluoroscopic guidance. We then palpated along the medial aspect of the pedicles to rule out cortical breaches. There were none. The nerve roots were not injured. We then connected the unilateral pedicle screws with a lordotic rod. We compressed the construct and secured the rod in place with the caps. We then tightened the caps appropriately. This completed the instrumentation from L4-5.  We now turned our attention to the posterior lateral arthrodesis at L4-5. We used the high-speed drill to decorticate the remainder of the facets, pars, transverse process at L4-5. We then applied a combination of local morselized autograft bone and Zimmer DBM over these decorticated posterior lateral structures. This completed the posterior lateral arthrodesis.  We then obtained hemostasis using bipolar electrocautery. We irrigated the wound out with vashe solution. We inspected the thecal sac and nerve roots and noted they were well decompressed. We then removed the retractor.  We placed a medium Hemovac drain in the epidural space and tunneled it out through a separate stab wound.  We injected Exparel . We reapproximated patient's thoracolumbar fascia with interrupted #1 Vicryl suture. We reapproximated patient's subcutaneous tissue with interrupted 2-0 Vicryl suture. The reapproximated patient's skin with Steri-Strips and benzoin. The wound was then coated with bacitracin ointment. A sterile dressing was applied. The drapes were removed. The patient was subsequently returned to the supine position  where they were extubated by the anesthesia team. He was then transported to the post anesthesia care unit in stable condition. All sponge instrument and needle counts were reportedly correct at the end of this case.

## 2023-08-23 NOTE — Transfer of Care (Signed)
 Immediate Anesthesia Transfer of Care Note  Patient: Heather [name]  Procedure(s) Performed: Posterior Lumbar Interbody Fusion,Interbody Prosthesis ,Posteror  Instrumentation , Lumbar four-five (Spine Lumbar)  Patient Location: PACU  Anesthesia Type:General  Level of Consciousness: awake and drowsy  Airway & Oxygen Therapy: Patient Spontanous Breathing and Patient connected to face mask oxygen  Post-op Assessment: Report given to RN and Post -op Vital signs reviewed and stable  Post vital signs: Reviewed and stable  Last Vitals:  Vitals Value Taken Time  BP 127/63 08/23/23 1155  Temp    Pulse 76 08/23/23 1158  Resp 16 08/23/23 1158  SpO2 99 % 08/23/23 1158  Vitals shown include unfiled device data.  Last Pain:  Vitals:   08/23/23 0614  PainSc: 0-No pain         Complications: No notable events documented.

## 2023-08-24 DIAGNOSIS — M5116 Intervertebral disc disorders with radiculopathy, lumbar region: Secondary | ICD-10-CM | POA: Diagnosis not present

## 2023-08-24 DIAGNOSIS — M4726 Other spondylosis with radiculopathy, lumbar region: Secondary | ICD-10-CM | POA: Diagnosis not present

## 2023-08-24 DIAGNOSIS — Z794 Long term (current) use of insulin: Secondary | ICD-10-CM | POA: Diagnosis not present

## 2023-08-24 DIAGNOSIS — K219 Gastro-esophageal reflux disease without esophagitis: Secondary | ICD-10-CM | POA: Diagnosis not present

## 2023-08-24 DIAGNOSIS — I2510 Atherosclerotic heart disease of native coronary artery without angina pectoris: Secondary | ICD-10-CM | POA: Diagnosis not present

## 2023-08-24 DIAGNOSIS — Z87891 Personal history of nicotine dependence: Secondary | ICD-10-CM | POA: Diagnosis not present

## 2023-08-24 DIAGNOSIS — M51372 Other intervertebral disc degeneration, lumbosacral region with discogenic back pain and lower extremity pain: Secondary | ICD-10-CM | POA: Diagnosis not present

## 2023-08-24 DIAGNOSIS — Z79899 Other long term (current) drug therapy: Secondary | ICD-10-CM | POA: Diagnosis not present

## 2023-08-24 DIAGNOSIS — K449 Diaphragmatic hernia without obstruction or gangrene: Secondary | ICD-10-CM | POA: Diagnosis not present

## 2023-08-24 DIAGNOSIS — Z7984 Long term (current) use of oral hypoglycemic drugs: Secondary | ICD-10-CM | POA: Diagnosis not present

## 2023-08-24 DIAGNOSIS — M4316 Spondylolisthesis, lumbar region: Secondary | ICD-10-CM | POA: Diagnosis not present

## 2023-08-24 DIAGNOSIS — M48062 Spinal stenosis, lumbar region with neurogenic claudication: Secondary | ICD-10-CM | POA: Diagnosis not present

## 2023-08-24 DIAGNOSIS — I10 Essential (primary) hypertension: Secondary | ICD-10-CM | POA: Diagnosis not present

## 2023-08-24 DIAGNOSIS — E119 Type 2 diabetes mellitus without complications: Secondary | ICD-10-CM | POA: Diagnosis not present

## 2023-08-24 LAB — GLUCOSE, CAPILLARY: Glucose-Capillary: 137 mg/dL — ABNORMAL HIGH (ref 70–99)

## 2023-08-24 MED ORDER — OXYCODONE-ACETAMINOPHEN 5-325 MG PO TABS: 1.0000 | ORAL_TABLET | ORAL | 0 refills | Status: DC | PRN

## 2023-08-24 MED ORDER — OXYCODONE-ACETAMINOPHEN 5-325 MG PO TABS: 1.0000 | ORAL_TABLET | ORAL | Status: DC | PRN

## 2023-08-24 MED ORDER — DOCUSATE SODIUM 100 MG PO CAPS: 100.0000 mg | ORAL_CAPSULE | Freq: Two times a day (BID) | ORAL | 0 refills | Status: DC

## 2023-08-24 NOTE — Discharge Summary (Signed)
 Physician Discharge Summary  Patient ID: Heather [name] MRN: [id number] DOB/AGE: Mar 15, 1951 73 y.o.  Admit date: 08/23/2023 Discharge date: 08/24/2023  Admission Diagnoses: Lumbar spinal stenosis, lumbar facet [name], lumbago, neurogenic claudication, lumbar radiculopathy  Discharge Diagnoses: The same Principal Problem:   Spondylolisthesis of lumbar region   Discharged Condition: good  Hospital Course: I performed an L4-5 decompression, instrumentation and fusion on the patient on 08/23/2023.  The surgery went well.  The patient's postoperative course was unremarkable.  On postoperative day #1 she felt well and requested discharge to home.  She was given verbal and written discharge instructions.  All her questions were answered.  Consults: PT, care management Significant Diagnostic Studies: None Treatments: L4-5 decompression, instrumentation and fusion. Discharge Exam: Blood pressure (!) 101/50, pulse 74, temperature 97.8 F (36.6 C), temperature source Oral, resp. rate 16, height 5' (1.524 m), weight 79.4 kg, SpO2 100%. The patient is alert and pleasant.  She looks well.  Her strength is normal.  Disposition: Home  Discharge Instructions     Call MD for:  difficulty breathing, headache or visual disturbances   Complete by: As directed    Call MD for:  extreme fatigue   Complete by: As directed    Call MD for:  hives   Complete by: As directed    Call MD for:  persistant dizziness or light-headedness   Complete by: As directed    Call MD for:  persistant nausea and vomiting   Complete by: As directed    Call MD for:  redness, tenderness, or signs of infection (pain, swelling, redness, odor or green/yellow discharge around incision site)   Complete by: As directed    Call MD for:  severe uncontrolled pain   Complete by: As directed    Call MD for:  temperature >100.4   Complete by: As directed    Diet - low sodium heart healthy   Complete by: As directed    Discharge  instructions   Complete by: As directed    Call [phone] for a followup appointment. Take a stool softener while you are using pain medications.   Driving Restrictions   Complete by: As directed    Do not drive for 2 weeks.   Increase activity slowly   Complete by: As directed    Lifting restrictions   Complete by: As directed    Do not lift more than 5 pounds. No excessive bending or twisting.   May shower / Bathe   Complete by: As directed    Remove the dressing for 3 days after surgery.  You may shower, but leave the incision alone.   Remove dressing in 48 hours   Complete by: As directed       Allergies as of 08/24/2023       Reactions   Tramadol Nausea And Vomiting   Farxiga [dapagliflozin]    Constant Vaginal Infections   Flagyl [metronidazole] Other (See Comments)   Made really sick. Was able to tolerate dose 4/17 with zofran   Iodine Other (See Comments)   breaks out in blisters if stays on skin   Jardiance [empagliflozin]    Constant Vaginal infections   Lisinopril    04/03/15 cough reported X several months   Cholestyramine Other (See Comments)   Joint pain   Metformin And Related Other (See Comments)   Loose stools   Prandin [repaglinide] Nausea Only   Lowered Blood Sugar    Sulfonamide Derivatives Nausea Only  Medication List     STOP taking these medications    HYDROcodone-acetaminophen 5-325 MG tablet Commonly known as: NORCO/VICODIN   ibuprofen 200 MG tablet Commonly known as: ADVIL       TAKE these medications    BD Pen Needle Micro U/F 32G X 6 MM Misc Generic drug: Insulin Pen Needle Inject into the skin as directed.   diclofenac Sodium 1 % Gel Commonly known as: VOLTAREN APPLY 4 GRAMS TO AFFECTED JOINTS 4 TIMES A DAY   docusate sodium 100 MG capsule Commonly known as: COLACE Take 1 capsule (100 mg total) by mouth 2 (two) times daily.   ezetimibe 10 MG tablet Commonly known as: ZETIA TAKE 1 TABLET BY MOUTH EVERY DAY    famotidine 10 MG tablet Commonly known as: PEPCID Take 10 mg by mouth daily as needed for heartburn or indigestion.   gabapentin 600 MG tablet Commonly known as: NEURONTIN Take 1 tablet (600 mg total) by mouth at bedtime.   gabapentin 100 MG capsule Commonly known as: NEURONTIN Take 1 capsule (100 mg total) by mouth at bedtime.   hydrochlorothiazide 25 MG tablet Commonly known as: HYDRODIURIL Take 1 tablet (25 mg total) by mouth daily.   insulin lispro 100 UNIT/ML KwikPen Commonly known as: HumaLOG KwikPen Using as directed with SS   Inject 4-10 units into the skin 3 times a day before meals prn   < 150  0 units 151-200  2 units 201-250  4 units   >250      8 units   Lantus SoloStar 100 UNIT/ML Solostar Pen Generic drug: insulin glargine Inject 10 Units into the skin in the morning.   olmesartan 40 MG tablet Commonly known as: BENICAR Take 1 tablet (40 mg total) by mouth daily.   ondansetron 4 MG disintegrating tablet Commonly known as: ZOFRAN-ODT Take 1 tablet (4 mg total) by mouth every 8 (eight) hours as needed for nausea or vomiting. Please schedule an office visit for further refills.   ONE TOUCH ULTRA 2 w/Device Kit 1 each by Does not apply route 4 (four) times daily. Use device to monitor glucose levels 4 times per day; E11.9   OneTouch Delica Plus Lancet33G Misc USE TO TEST BLOOD SUGARS ONCE  DAILY   OneTouch Ultra test strip Generic drug: glucose blood TEST ONCE DAILY   oxyCODONE-acetaminophen 5-325 MG tablet Commonly known as: PERCOCET/ROXICET Take 1-2 tablets by mouth every 4 (four) hours as needed for moderate pain (pain score 4-6).   rosuvastatin 5 MG tablet Commonly known as: Crestor Take 1 tablet (5 mg total) by mouth daily.   tiZANidine 4 MG tablet Commonly known as: Zanaflex Take 1 tablet (4 mg total) by mouth every 8 (eight) hours as needed (back pain, muscle spasms).   Trulicity 1.5 MG/0.5ML Soaj Generic drug: Dulaglutide INJECT 1.5 MG INTO  THE SKIN ONCE A WEEK         Signed: Cristi Loron 08/24/2023, 7:43 AM

## 2023-08-24 NOTE — Evaluation (Signed)
 Physical Therapy Evaluation  Patient Details Name: Heather [name] MRN: [id number] DOB: Oct 25, 1950 Today's Date: 08/24/2023  History of Present Illness  Pt is a 73 y/o female who presents s/p L4-L5 PLIF on 08/23/2023. PMH significant for Aortic stenosis, CAD, DM, HTN.  Clinical Impression  Pt admitted with above diagnosis. At the time of PT eval, pt was able to demonstrate transfers and ambulation with gross CGA and RW for support. Reviewed completion of ADL's within limits of back precautions. Pt was educated on brace application/wearing schedule, appropriate activity progression, and car transfer. Pt currently with functional limitations due to the deficits listed below (see PT Problem List). Pt will benefit from skilled PT to increase their independence and safety with mobility to allow discharge to the venue listed below.          If plan is discharge home, recommend the following: A little help with walking and/or transfers;A little help with bathing/dressing/bathroom;Assistance with cooking/housework;Assist for transportation;Help with stairs or ramp for entrance   Can travel by private vehicle        Equipment Recommendations None recommended by PT  Recommendations for Other Services       Functional Status Assessment Patient has had a recent decline in their functional status and demonstrates the ability to make significant improvements in function in a reasonable and predictable amount of time.     Precautions / Restrictions Precautions Precautions: Fall;Back Precaution Booklet Issued: Yes (comment) Recall of Precautions/Restrictions: Intact Precaution/Restrictions Comments: Reviewed handout and pt was cued for precautions during functional mobility. Required Braces or Orthoses: Spinal Brace Spinal Brace: Lumbar corset;Applied in sitting position Restrictions Weight Bearing Restrictions Per Provider Order: No      Mobility  Bed Mobility               General bed  mobility comments: Verbally reviewed handout and pt was cued for precautions during functional mobility.    Transfers Overall transfer level: Needs assistance Equipment used: Rolling walker (2 wheels) Transfers: Sit to/from Stand Sit to Stand: Contact guard assist           General transfer comment: VC's for hand placement on seated surface for safety.    Ambulation/Gait Ambulation/Gait assistance: Contact guard assist Gait Distance (Feet): 350 Feet Assistive device: Rolling walker (2 wheels) Gait Pattern/deviations: Step-through pattern, Decreased stride length, Trunk flexed Gait velocity: Decreased Gait velocity interpretation: <1.31 ft/sec, indicative of household ambulator   General Gait Details: VC's for improved posture, closer walker proximity and forward gaze. No assist required, however hands on guarding provided for safety.  Stairs Stairs: Yes Stairs assistance: Contact guard assist Stair Management: One rail Right, Step to pattern, Forwards Number of Stairs: 4 General stair comments: VC's for sequencing and general safety.  Wheelchair Mobility     Tilt Bed    Modified Rankin (Stroke Patients Only)       Balance Overall balance assessment: Needs assistance Sitting-balance support: Feet supported, No upper extremity supported       Standing balance support: During functional activity, Bilateral upper extremity supported, Reliant on assistive device for balance Standing balance-Leahy Scale: Poor                               Pertinent Vitals/Pain Pain Assessment Pain Assessment: Faces Faces Pain Scale: Hurts little more Pain Location: Incision site Pain Descriptors / Indicators: Operative site guarding, Sore Pain Intervention(s): Limited activity within patient's tolerance, Monitored during session, Repositioned  Home Living Family/patient expects to be discharged to:: Private residence Living Arrangements: Other  relatives Available Help at Discharge: Family;Available 24 hours/day Type of Home: House Home Access: Stairs to enter Entrance Stairs-Rails: Right Entrance Stairs-Number of Steps: 3   Home Layout: One level Home Equipment: Agricultural consultant (2 wheels);Hand held shower head;Cane - single point      Prior Function Prior Level of Function : Independent/Modified Independent             Mobility Comments: With [facility]       Extremity/Trunk Assessment   Upper Extremity Assessment Upper Extremity Assessment: Overall WFL for tasks assessed    Lower Extremity Assessment Lower Extremity Assessment: Generalized weakness;RLE deficits/detail RLE Deficits / Details: Increased weakness and ROM limitations consistent with pre-op diagnosis    Cervical / Trunk Assessment Cervical / Trunk Assessment: Back Surgery  Communication   Communication Communication: No apparent difficulties    Cognition Arousal: Alert Behavior During Therapy: WFL for tasks assessed/performed   PT - Cognitive impairments: No apparent impairments                         Following commands: Intact       Cueing Cueing Techniques: Verbal cues, Gestural cues     General Comments      Exercises     Assessment/Plan    PT Assessment Patient needs continued PT services  PT Problem List Decreased strength;Decreased activity tolerance;Decreased balance;Decreased mobility;Decreased knowledge of use of DME;Decreased safety awareness;Decreased knowledge of precautions;Pain       PT Treatment Interventions DME instruction;Gait training;Stair training;Functional mobility training;Therapeutic activities;Therapeutic exercise;Balance training;Patient/family education    PT Goals (Current goals can be found in the Care Plan section)  Acute Rehab PT Goals Patient Stated Goal: Home today PT Goal Formulation: With patient/family Time For Goal Achievement: 08/31/23 Potential to Achieve Goals: Good     Frequency Min 5X/week     Co-evaluation               AM-PAC PT "6 Clicks" Mobility  Outcome Measure Help needed turning from your back to your side while in a flat bed without using bedrails?: A Little Help needed moving from lying on your back to sitting on the side of a flat bed without using bedrails?: A Little Help needed moving to and from a bed to a chair (including a wheelchair)?: A Little Help needed standing up from a chair using your arms (e.g., wheelchair or bedside chair)?: A Little Help needed to walk in hospital room?: A Little Help needed climbing 3-5 steps with a railing? : A Little 6 Click Score: 18    End of Session Equipment Utilized During Treatment: Gait belt;Back brace Activity Tolerance: Patient tolerated treatment well Patient left: in bed;with call bell/phone within reach;with family/visitor present Nurse Communication: Mobility status PT Visit Diagnosis: Unsteadiness on feet (R26.81);Pain Pain - part of body:  (back)    Time: 6301-6010 PT Time Calculation (min) (ACUTE ONLY): 41 min   Charges:   PT Evaluation $PT Eval Low Complexity: 1 Low PT Treatments $Gait Training: 8-22 mins PT General Charges $$ ACUTE PT VISIT: 1 Visit         Conni Slipper, PT, DPT Acute Rehabilitation Services Secure Chat Preferred Office: [phone]   Marylynn Pearson 08/24/2023, 11:13 AM

## 2023-08-24 NOTE — Progress Notes (Signed)
   08/24/23 1045  AVS Discharge Documentation  AVS Discharge Instructions Including Medications Provided to patient/caregiver  Name of Person Receiving AVS Discharge Instructions Including Medications Heather [name]  Name of Clinician That Reviewed AVS Discharge Instructions Including Medications Janece Canterbury RN    RN went over discharge instructions with patient at the bedside. Sister at bedside. RN went over medication changes and follow up appointments, patient verbalize understanding. Sister assisting patient on getting dressed.

## 2023-08-27 MED FILL — Heparin Sodium (Porcine) Inj 1000 Unit/ML: INTRAMUSCULAR | Qty: 30 | Status: AC

## 2023-08-27 MED FILL — Sodium Chloride IV Soln 0.9%: INTRAVENOUS | Qty: 3000 | Status: AC

## 2023-08-28 ENCOUNTER — Other Ambulatory Visit: Payer: Self-pay | Admitting: Internal Medicine

## 2023-09-07 ENCOUNTER — Other Ambulatory Visit (INDEPENDENT_AMBULATORY_CARE_PROVIDER_SITE_OTHER)

## 2023-09-07 ENCOUNTER — Encounter: Payer: Self-pay | Admitting: Internal Medicine

## 2023-09-07 DIAGNOSIS — E782 Mixed hyperlipidemia: Secondary | ICD-10-CM | POA: Diagnosis not present

## 2023-09-07 LAB — LIPID PANEL
Cholesterol: 125 mg/dL (ref 0–200)
HDL: 45.1 mg/dL (ref >39.00)
LDL Cholesterol: 46 mg/dL (ref 0–99)
NonHDL: 79.61
Total CHOL/HDL Ratio: 3
Triglycerides: 170 mg/dL — ABNORMAL HIGH (ref 0.0–149.0)
VLDL: 34 mg/dL (ref 0.0–40.0)

## 2023-09-07 LAB — HEPATIC FUNCTION PANEL
ALT: 11 U/L (ref 0–35)
AST: 16 U/L (ref 0–37)
Albumin: 4.1 g/dL (ref 3.5–5.2)
Alkaline Phosphatase: 68 U/L (ref 39–117)
Bilirubin, Direct: 0.1 mg/dL (ref 0.0–0.3)
Total Bilirubin: 0.7 mg/dL (ref 0.2–1.2)
Total Protein: 7.2 g/dL (ref 6.0–8.3)

## 2023-09-09 ENCOUNTER — Other Ambulatory Visit: Payer: Self-pay | Admitting: Internal Medicine

## 2023-09-14 DIAGNOSIS — M4316 Spondylolisthesis, lumbar region: Secondary | ICD-10-CM | POA: Diagnosis not present

## 2023-10-16 ENCOUNTER — Other Ambulatory Visit: Payer: Self-pay | Admitting: Internal Medicine

## 2023-10-28 ENCOUNTER — Encounter: Payer: Self-pay | Admitting: Internal Medicine

## 2023-10-28 DIAGNOSIS — Z981 Arthrodesis status: Secondary | ICD-10-CM | POA: Insufficient documentation

## 2023-10-28 NOTE — Progress Notes (Signed)
 Subjective:    Patient ID: Heather [name], female    DOB: [birth year], 73 y.o.   MRN: [id number]      HPI Loretta is here for a Physical exam and her chronic medical problems.    In April she had a fusion of L4-5.  Doing well postsurgery.   Watching diet.  Not able to exercise much due to surgery-she has been walking a little and trying to increase that slowly   Medications and allergies reviewed with patient and updated if appropriate.  Current Outpatient Medications on File Prior to Visit  Medication Sig Dispense Refill   Apoaequorin (PREVAGEN) 10 MG CAPS Take by mouth.     BD PEN NEEDLE MICRO U/F 32G X 6 MM MISC Inject into the skin as directed. 100 each 3   Blood Glucose Monitoring Suppl (ONE TOUCH ULTRA 2) w/Device KIT 1 each by Does not apply route 4 (four) times daily. Use device to monitor glucose levels 4 times per day; E11.9 1 kit 0   ezetimibe  (ZETIA ) 10 MG tablet TAKE 1 TABLET BY MOUTH EVERY DAY 90 tablet 3   famotidine  (PEPCID ) 10 MG tablet Take 10 mg by mouth daily as needed for heartburn or indigestion.     gabapentin  (NEURONTIN ) 600 MG tablet Take 1 tablet (600 mg total) by mouth at bedtime. 180 tablet 1   hydrochlorothiazide  (HYDRODIURIL ) 25 MG tablet Take 1 tablet (25 mg total) by mouth daily. 90 tablet 1   insulin  lispro (HUMALOG  KWIKPEN) 100 UNIT/ML KwikPen Using as directed with SS   Inject 4-10 units into the skin 3 times a day before meals prn   < 150  0 units 151-200  2 units 201-250  4 units   >250      8 units 15 mL 11   Lancets (ONETOUCH DELICA PLUS LANCET33G) MISC USE TO TEST BLOOD SUGARS ONCE  DAILY 100 each 2   LANTUS  SOLOSTAR 100 UNIT/ML Solostar Pen Inject 10 Units into the skin in the morning. 15 mL 0   olmesartan  (BENICAR ) 40 MG tablet Take 1 tablet (40 mg total) by mouth daily. 90 tablet 3   ondansetron  (ZOFRAN -ODT) 4 MG disintegrating tablet Take 1 tablet (4 mg total) by mouth every 8 (eight) hours as needed for nausea or vomiting. Please  schedule an office visit for further refills. 20 tablet 0   ONETOUCH ULTRA test strip TEST ONCE DAILY 100 strip 2   rosuvastatin  (CRESTOR ) 5 MG tablet Take 1 tablet (5 mg total) by mouth daily. 90 tablet 1   tiZANidine  (ZANAFLEX ) 4 MG tablet Take 1 tablet (4 mg total) by mouth every 8 (eight) hours as needed (back pain, muscle spasms). 60 tablet 2   No current facility-administered medications on file prior to visit.    Review of Systems  Constitutional:  Negative for fever.       Having hot flashes then gets cold  Eyes:  Negative for visual disturbance.  Respiratory:  Positive for cough and shortness of breath (when is does too much - heat is part of it). Negative for wheezing.   Cardiovascular:  Negative for chest pain, palpitations and leg swelling.  Gastrointestinal:  Negative for abdominal pain, blood in stool, constipation and diarrhea.       Occ gerd  Genitourinary:  Negative for dysuria.  Musculoskeletal:  Positive for arthralgias and back pain (improved since surgery).  Skin:  Negative for rash.  Neurological:  Positive for light-headedness (with changes in position) and headaches.  Psychiatric/Behavioral:  Negative for dysphoric mood. The patient is not nervous/anxious.        Objective:   Vitals:   10/29/23 0807  BP: 120/82  Pulse: 81  Temp: 98.2 F (36.8 C)  SpO2: 97%   Filed Weights   10/29/23 0807  Weight: 170 lb (77.1 kg)   Body mass index is 33.2 kg/m.  BP Readings from Last 3 Encounters:  10/29/23 120/82  08/24/23 (!) 104/56  08/10/23 139/78    Wt Readings from Last 3 Encounters:  10/29/23 170 lb (77.1 kg)  08/23/23 175 lb (79.4 kg)  08/10/23 176 lb 9.6 oz (80.1 kg)       Physical Exam Constitutional: She appears well-developed and well-nourished. No distress.  HENT:  Head: Normocephalic and atraumatic.  Right Ear: External ear normal. Normal ear canal and TM Left Ear: External ear normal.  Normal ear canal and TM Mouth/Throat: Oropharynx  is clear and moist.  Eyes: Conjunctivae normal.  Neck: Neck supple. No tracheal deviation present. No thyromegaly present.  No carotid bruit  Cardiovascular: Normal rate, regular rhythm and normal heart sounds.   2/6 systolic murmur heard.  No edema. Pulmonary/Chest: Effort normal and breath sounds normal. No respiratory distress. She has no wheezes. She has no rales.  Breast: deferred   Abdominal: Soft. She exhibits no distension. There is no tenderness.  Lymphadenopathy: She has no cervical adenopathy.  Skin: Skin is warm and dry. She is not diaphoretic.  Psychiatric: She has a normal mood and affect. Her behavior is normal.   Diabetic Foot Exam - Simple   Simple Foot Form Diabetic Foot exam was performed with the following findings: Yes 10/29/2023  8:55 AM  Visual Inspection No deformities, no ulcerations, no other skin breakdown bilaterally: Yes Sensation Testing See comments: Yes Pulse Check Posterior Tibialis and Dorsalis pulse intact bilaterally: Yes Comments Significantly decreased sensation b/l to light touch      Lab Results  Component Value Date   WBC 7.5 08/10/2023   HGB 13.0 08/10/2023   HCT 39.7 08/10/2023   PLT 314 08/10/2023   GLUCOSE 108 (H) 08/10/2023   CHOL 125 09/07/2023   TRIG 170.0 (H) 09/07/2023   HDL 45.10 09/07/2023   LDLDIRECT 108.0 04/27/2019   LDLCALC 46 09/07/2023   ALT 11 09/07/2023   AST 16 09/07/2023   NA 137 08/10/2023   K 4.2 08/10/2023   CL 102 08/10/2023   CREATININE 0.90 08/10/2023   BUN 13 08/10/2023   CO2 26 08/10/2023   TSH 1.11 04/29/2021   HGBA1C 7.0 (H) 08/10/2023   MICROALBUR 0.7 04/30/2023         Assessment & Plan:   Physical exam: Screening blood work  ordered Exercise  walking some -limited by back pain Weight  obese  Substance abuse  none   Reviewed recommended immunizations.   Health Maintenance  Topic Date Due   INFLUENZA VACCINE  12/17/2023   OPHTHALMOLOGY EXAM  12/24/2023   HEMOGLOBIN A1C   02/10/2024   COVID-19 Vaccine (9 - 2024-25 season) 04/13/2024   Diabetic kidney evaluation - Urine ACR  04/29/2024   Medicare Annual Wellness (AWV)  06/14/2024   DEXA SCAN  07/02/2024   Diabetic kidney evaluation - eGFR measurement  08/09/2024   FOOT EXAM  10/28/2024   Colonoscopy  08/17/2027   DTaP/Tdap/Td (5 - Td or Tdap) 10/11/2033   Pneumococcal Vaccine: 50+ Years  Completed   Hepatitis C Screening  Completed   Zoster Vaccines- Shingrix  Completed   HPV  VACCINES  Aged Out   Meningococcal B Vaccine  Aged Out          See Problem List for Assessment and Plan of chronic medical problems.

## 2023-10-28 NOTE — Patient Instructions (Addendum)
 Blood work was ordered.       Medications changes include :   increase trulicity  to 3 mg weekly, gabapentin  1200 mg at bedtime   Your next trulicity  refill we will increase it to 4.5 mg   Return in about 6 months (around 04/29/2024) for follow up.     Health Maintenance, Female Adopting a healthy lifestyle and getting preventive care are important in promoting health and wellness. Ask your health care provider about: The right schedule for you to have regular tests and exams. Things you can do on your own to prevent diseases and keep yourself healthy. What should I know about diet, weight, and exercise? Eat a healthy diet  Eat a diet that includes plenty of vegetables, fruits, low-fat dairy products, and lean protein. Do not eat a lot of foods that are high in solid fats, added sugars, or sodium. Maintain a healthy weight Body mass index (BMI) is used to identify weight problems. It estimates body fat based on height and weight. Your health care provider can help determine your BMI and help you achieve or maintain a healthy weight. Get regular exercise Get regular exercise. This is one of the most important things you can do for your health. Most adults should: Exercise for at least 150 minutes each week. The exercise should increase your heart rate and make you sweat (moderate-intensity exercise). Do strengthening exercises at least twice a week. This is in addition to the moderate-intensity exercise. Spend less time sitting. Even light physical activity can be beneficial. Watch cholesterol and blood lipids Have your blood tested for lipids and cholesterol at 75 years of age, then have this test every 5 years. Have your cholesterol levels checked more often if: Your lipid or cholesterol levels are high. You are older than 75 years of age. You are at high risk for heart disease. What should I know about cancer screening? Depending on your health history and family  history, you may need to have cancer screening at various ages. This may include screening for: Breast cancer. Cervical cancer. Colorectal cancer. Skin cancer. Lung cancer. What should I know about heart disease, diabetes, and high blood pressure? Blood pressure and heart disease High blood pressure causes heart disease and increases the risk of stroke. This is more likely to develop in people who have high blood pressure readings or are overweight. Have your blood pressure checked: Every 3-5 years if you are 59-62 years of age. Every year if you are 41 years old or older. Diabetes Have regular diabetes screenings. This checks your fasting blood sugar level. Have the screening done: Once every three years after age 52 if you are at a normal weight and have a low risk for diabetes. More often and at a younger age if you are overweight or have a high risk for diabetes. What should I know about preventing infection? Hepatitis B If you have a higher risk for hepatitis B, you should be screened for this virus. Talk with your health care provider to find out if you are at risk for hepatitis B infection. Hepatitis C Testing is recommended for: Everyone born from [birth year] through 1967. Anyone with known risk factors for hepatitis C. Sexually transmitted infections (STIs) Get screened for STIs, including gonorrhea and chlamydia, if: You are sexually active and are younger than 75 years of age. You are older than 75 years of age and your health care provider tells you that you are at risk for this  type of infection. Your sexual activity has changed since you were last screened, and you are at increased risk for chlamydia or gonorrhea. Ask your health care provider if you are at risk. Ask your health care provider about whether you are at high risk for HIV. Your health care provider may recommend a prescription medicine to help prevent HIV infection. If you choose to take medicine to prevent HIV, you  should first get tested for HIV. You should then be tested every 3 months for as long as you are taking the medicine. Pregnancy If you are about to stop having your period (premenopausal) and you may become pregnant, seek counseling before you get pregnant. Take 400 to 800 micrograms (mcg) of folic acid  every day if you become pregnant. Ask for birth control (contraception) if you want to prevent pregnancy. Osteoporosis and menopause Osteoporosis is a disease in which the bones lose minerals and strength with aging. This can result in bone fractures. If you are 67 years old or older, or if you are at risk for osteoporosis and fractures, ask your health care provider if you should: Be screened for bone loss. Take a calcium  or vitamin D  supplement to lower your risk of fractures. Be given hormone replacement therapy (HRT) to treat symptoms of menopause. Follow these instructions at home: Alcohol use Do not drink alcohol if: Your health care provider tells you not to drink. You are pregnant, may be pregnant, or are planning to become pregnant. If you drink alcohol: Limit how much you have to: 0-1 drink a day. Know how much alcohol is in your drink. In the [place] one drink equals one 12 oz bottle of beer (355 mL), one 5 oz glass of wine (148 mL), or one 1 oz glass of hard liquor (44 mL). Lifestyle Do not use any products that contain nicotine or tobacco. These products include cigarettes, chewing tobacco, and vaping devices, such as e-cigarettes. If you need help quitting, ask your health care provider. Do not use street drugs. Do not share needles. Ask your health care provider for help if you need support or information about quitting drugs. General instructions Schedule regular health, dental, and eye exams. Stay current with your vaccines. Tell your health care provider if: You often feel depressed. You have ever been abused or do not feel safe at home. Summary Adopting a healthy  lifestyle and getting preventive care are important in promoting health and wellness. Follow your health care provider's instructions about healthy diet, exercising, and getting tested or screened for diseases. Follow your health care provider's instructions on monitoring your cholesterol and blood pressure. This information is not intended to replace advice given to you by your health care provider. Make sure you discuss any questions you have with your health care provider. Document Revised: 09/23/2020 Document Reviewed: 09/23/2020 Elsevier Patient Education  2024 [employer].

## 2023-10-29 ENCOUNTER — Ambulatory Visit (INDEPENDENT_AMBULATORY_CARE_PROVIDER_SITE_OTHER): Payer: Medicare Other | Admitting: Internal Medicine

## 2023-10-29 VITALS — BP 120/82 | HR 81 | Temp 98.2°F | Ht 60.0 in | Wt 170.0 lb

## 2023-10-29 DIAGNOSIS — E0842 Diabetes mellitus due to underlying condition with diabetic polyneuropathy: Secondary | ICD-10-CM

## 2023-10-29 DIAGNOSIS — E559 Vitamin D deficiency, unspecified: Secondary | ICD-10-CM

## 2023-10-29 DIAGNOSIS — E782 Mixed hyperlipidemia: Secondary | ICD-10-CM | POA: Diagnosis not present

## 2023-10-29 DIAGNOSIS — E1142 Type 2 diabetes mellitus with diabetic polyneuropathy: Secondary | ICD-10-CM

## 2023-10-29 DIAGNOSIS — I10 Essential (primary) hypertension: Secondary | ICD-10-CM | POA: Diagnosis not present

## 2023-10-29 DIAGNOSIS — Z794 Long term (current) use of insulin: Secondary | ICD-10-CM

## 2023-10-29 DIAGNOSIS — Z7985 Long-term (current) use of injectable non-insulin antidiabetic drugs: Secondary | ICD-10-CM

## 2023-10-29 DIAGNOSIS — Z0000 Encounter for general adult medical examination without abnormal findings: Secondary | ICD-10-CM | POA: Diagnosis not present

## 2023-10-29 MED ORDER — GABAPENTIN 600 MG PO TABS: 1200.0000 mg | ORAL_TABLET | Freq: Every day | ORAL | 1 refills | Status: DC

## 2023-10-29 MED ORDER — ROSUVASTATIN CALCIUM 5 MG PO TABS: 5.0000 mg | ORAL_TABLET | Freq: Every day | ORAL | 2 refills | Status: DC

## 2023-10-29 MED ORDER — HYDROCHLOROTHIAZIDE 25 MG PO TABS: 25.0000 mg | ORAL_TABLET | Freq: Every day | ORAL | 2 refills | Status: DC

## 2023-10-29 MED ORDER — BD PEN NEEDLE MICRO ULTRAFINE 32G X 6 MM MISC: 5 refills | Status: AC

## 2023-10-29 MED ORDER — ONETOUCH ULTRA VI STRP: ORAL_STRIP | 3 refills | Status: AC

## 2023-10-29 MED ORDER — TRULICITY 3 MG/0.5ML ~~LOC~~ SOAJ: 3.0000 mg | SUBCUTANEOUS | 0 refills | Status: DC

## 2023-10-29 NOTE — Assessment & Plan Note (Addendum)
 Chronic Blood pressure controlled Cmp, cbc Continue hydrochlorothiazide  25 mg daily, olmesartan  40 mg daily Advised monitoring BP closely at home since some of her pain over the last 6 months was related to her back pain and as her back pain has improved, she is becoming more active and hopefully will lose weight with the increased Trulicity  blood pressure may decrease and we may need to decrease her blood pressure medication

## 2023-10-29 NOTE — Assessment & Plan Note (Signed)
 Chronic Check lipid panel, cmp Continue Zetia  10 mg daily, Crestor  5 mg daily

## 2023-10-29 NOTE — Assessment & Plan Note (Addendum)
 Chronic Not ideally controlled-increased pain Increase gabapentin  back to 1200 mg at bedtime

## 2023-10-29 NOTE — Assessment & Plan Note (Addendum)
 Chronic  Lab Results  Component Value Date   HGBA1C 7.0 (H) 08/10/2023   Sugars not ideally controlled Sugars 140-180 fasting at home On insulin  - needs to check sugars 1-4 times a day Check A1c Encouraged regular exercise-is much as tolerated,diabetic diet Continue Trulicity   but increase to 3 mg weekly  Continue 10 units Lantus  daily, Humalog  sliding scale Discussed to monitor sugars closely since we are increasing the Trulicity  and hopefully will need less insulin -goal is to get off  Plan to increase Trulicity  up to 4.5 mg with next refill

## 2023-10-29 NOTE — Assessment & Plan Note (Signed)
 Chronic Taking vitamin D daily Check vitamin D level

## 2023-10-30 ENCOUNTER — Ambulatory Visit: Payer: Self-pay | Admitting: Internal Medicine

## 2023-10-30 LAB — COMPREHENSIVE METABOLIC PANEL WITH GFR
AG Ratio: 1.8 (calc) (ref 1.0–2.5)
ALT: 11 U/L (ref 6–29)
AST: 13 U/L (ref 10–35)
Albumin: 4.6 g/dL (ref 3.6–5.1)
Alkaline phosphatase (APISO): 56 U/L (ref 37–153)
BUN: 23 mg/dL (ref 7–25)
CO2: 28 mmol/L (ref 20–32)
Calcium: 9.8 mg/dL (ref 8.6–10.4)
Chloride: 102 mmol/L (ref 98–110)
Creat: 1 mg/dL (ref 0.60–1.00)
Globulin: 2.5 g/dL (ref 1.9–3.7)
Glucose, Bld: 119 mg/dL — ABNORMAL HIGH (ref 65–99)
Potassium: 4 mmol/L (ref 3.5–5.3)
Sodium: 139 mmol/L (ref 135–146)
Total Bilirubin: 1.1 mg/dL (ref 0.2–1.2)
Total Protein: 7.1 g/dL (ref 6.1–8.1)
eGFR: 59 mL/min/{1.73_m2} — ABNORMAL LOW (ref >=60)

## 2023-10-30 LAB — HEMOGLOBIN A1C
Hgb A1c MFr Bld: 7 % — ABNORMAL HIGH (ref <5.7)
Mean Plasma Glucose: 154 mg/dL
eAG (mmol/L): 8.5 mmol/L

## 2023-10-30 LAB — LIPID PANEL
Cholesterol: 150 mg/dL (ref <200)
HDL: 64 mg/dL (ref >=50)
LDL Cholesterol (Calc): 61 mg/dL
Non-HDL Cholesterol (Calc): 86 mg/dL (ref <130)
Total CHOL/HDL Ratio: 2.3 (calc) (ref <5.0)
Triglycerides: 170 mg/dL — ABNORMAL HIGH (ref <150)

## 2023-10-30 LAB — CBC WITH DIFFERENTIAL/PLATELET
Absolute Lymphocytes: 2152 {cells}/uL (ref 850–3900)
Absolute Monocytes: 514 {cells}/uL (ref 200–950)
Basophils Absolute: 39 {cells}/uL (ref 0–200)
Basophils Relative: 0.6 %
Eosinophils Absolute: 182 {cells}/uL (ref 15–500)
Eosinophils Relative: 2.8 %
HCT: 35.7 % (ref 35.0–45.0)
Hemoglobin: 11.1 g/dL — ABNORMAL LOW (ref 11.7–15.5)
MCH: 25.7 pg — ABNORMAL LOW (ref 27.0–33.0)
MCHC: 31.1 g/dL — ABNORMAL LOW (ref 32.0–36.0)
MCV: 82.6 fL (ref 80.0–100.0)
MPV: 9.9 fL (ref 7.5–12.5)
Monocytes Relative: 7.9 %
Neutro Abs: 3614 {cells}/uL (ref 1500–7800)
Neutrophils Relative %: 55.6 %
Platelets: 337 10*3/uL (ref 140–400)
RBC: 4.32 10*6/uL (ref 3.80–5.10)
RDW: 13.1 % (ref 11.0–15.0)
Total Lymphocyte: 33.1 %
WBC: 6.5 10*3/uL (ref 3.8–10.8)

## 2023-10-30 LAB — VITAMIN D 25 HYDROXY (VIT D DEFICIENCY, FRACTURES): Vit D, 25-Hydroxy: 31 ng/mL (ref 30–100)

## 2023-10-30 LAB — TSH: TSH: 0.52 m[IU]/L (ref 0.40–4.50)

## 2023-11-16 ENCOUNTER — Other Ambulatory Visit: Payer: Self-pay

## 2023-11-16 ENCOUNTER — Encounter: Payer: Self-pay | Admitting: Internal Medicine

## 2023-11-16 MED ORDER — TRULICITY 3 MG/0.5ML ~~LOC~~ SOAJ: 3.0000 mg | SUBCUTANEOUS | 0 refills | Status: DC

## 2023-12-03 ENCOUNTER — Encounter: Payer: Self-pay | Admitting: Internal Medicine

## 2023-12-03 ENCOUNTER — Other Ambulatory Visit: Payer: Self-pay

## 2023-12-03 DIAGNOSIS — E1142 Type 2 diabetes mellitus with diabetic polyneuropathy: Secondary | ICD-10-CM

## 2023-12-03 MED ORDER — ONETOUCH DELICA PLUS LANCET33G MISC: 2 refills | Status: AC

## 2023-12-03 MED ORDER — BLOOD GLUCOSE TEST VI STRP: ORAL_STRIP | 11 refills | Status: AC

## 2023-12-03 MED ORDER — BLOOD GLUCOSE MONITORING SUPPL DEVI: 1.0000 | Freq: Three times a day (TID) | 0 refills | Status: DC

## 2023-12-06 ENCOUNTER — Encounter: Payer: Self-pay | Admitting: Internal Medicine

## 2023-12-09 ENCOUNTER — Other Ambulatory Visit: Payer: Self-pay

## 2023-12-09 DIAGNOSIS — E1142 Type 2 diabetes mellitus with diabetic polyneuropathy: Secondary | ICD-10-CM

## 2023-12-09 MED ORDER — BLOOD GLUCOSE MONITORING SUPPL DEVI: 0 refills | Status: AC

## 2023-12-14 DIAGNOSIS — M4316 Spondylolisthesis, lumbar region: Secondary | ICD-10-CM | POA: Diagnosis not present

## 2024-01-02 ENCOUNTER — Other Ambulatory Visit: Payer: Self-pay | Admitting: Internal Medicine

## 2024-01-02 DIAGNOSIS — E1142 Type 2 diabetes mellitus with diabetic polyneuropathy: Secondary | ICD-10-CM

## 2024-01-03 DIAGNOSIS — H524 Presbyopia: Secondary | ICD-10-CM | POA: Diagnosis not present

## 2024-01-03 DIAGNOSIS — E119 Type 2 diabetes mellitus without complications: Secondary | ICD-10-CM | POA: Diagnosis not present

## 2024-01-03 DIAGNOSIS — H5212 Myopia, left eye: Secondary | ICD-10-CM | POA: Diagnosis not present

## 2024-01-03 DIAGNOSIS — H35363 Drusen (degenerative) of macula, bilateral: Secondary | ICD-10-CM | POA: Diagnosis not present

## 2024-01-11 ENCOUNTER — Other Ambulatory Visit: Payer: Self-pay | Admitting: Internal Medicine

## 2024-01-18 ENCOUNTER — Other Ambulatory Visit: Payer: Self-pay | Admitting: Internal Medicine

## 2024-01-21 ENCOUNTER — Other Ambulatory Visit: Payer: Self-pay | Admitting: Internal Medicine

## 2024-02-01 LAB — MICROALBUMIN / CREATININE URINE RATIO: Albumin/Creatinine Ratio, Urine, POC: <30

## 2024-02-10 ENCOUNTER — Encounter: Payer: Self-pay | Admitting: Internal Medicine

## 2024-02-10 ENCOUNTER — Other Ambulatory Visit: Payer: Self-pay

## 2024-02-10 MED ORDER — TRULICITY 3 MG/0.5ML ~~LOC~~ SOAJ: 3.0000 mg | SUBCUTANEOUS | 0 refills | Status: DC

## 2024-02-20 NOTE — Progress Notes (Unsigned)
 Cardiology Office Note:    Date:  02/21/2024   ID:  Heather [name], DOB 05-22-1950, MRN [id number]  PCP:  Geofm Glade PARAS, MD   [facility] Providers Cardiologist:  Vinie JAYSON Maxcy, MD     Referring MD: Geofm Glade PARAS, MD   Chief complaint: 69-month follow-up  History of Present Illness:    Heather [name] is a 73 y.o. female with a hx of elevated coronary calcium  score, aortic atherosclerosis, mitral valve regurgitation, aortic stenosis, mild dilation of the ascending aorta, hypertension, hyperlipidemia, prior DVT, type 2 diabetes,  peripheral neuropathy, lumbar spinal stenosis and GERD who presents for 22-month follow-up.  Cardiac catheterization in 2012 was normal. Prior dobutamine  stress echo in 2018 was normal.  Coronary calcium  score in 04/2023 was 154 (65th percentile). There was evidence of mild dilation of the proximal ascending aorta, mild aortic root calcification.  Echocardiogram per PCP in 05/2023 showed EF 65 to 70%, normal LV function, no RWMA, G2 DD, normal RV systolic function, mildly dilated left atrium, mild to moderate mitral valve regurgitation, mild to moderate tricuspid valve regurgitation, mild to moderate aortic valve stenosis, mean gradient 18 mmHg, mild dilation of the ascending aorta measuring 38 mm.  Family history: Mother CVA, grandfather MI in his 23s.   Heather [name] presents to the office by herself today, appears stable from a cardiovascular standpoint.  She denies chest pain, palpitations, syncope, nausea, edema, weight gain.  Does report DOE following her spinal surgery in April of this year.  Has felt more fatigued and SOB on her walks around her neighborhood, feels as though she has not returned to her baseline prior to surgery.  Remains physically active by doing her yard work and caring for her paraplegic brother who lives with her.  She is widowed, previously smoked 1/2 PPD x 10 years, quit years ago.  Over the last 1.5 weeks has noticed episodes of  vertigo that happen spontaneously, unrelated to positional changes, no associated symptoms, severe to the point of causing her to lose her balance.  Reports headaches over the last several months, encompassing the entirety of her head, unresponsive to medications, has not followed up with neurology.  Past Medical History:  Diagnosis Date   Aortic stenosis    Arthritis    Colon polyps    adenomas- took 1/3 of colon   Coronary artery disease    Diabetes mellitus without complication (HCC)    Environmental allergies    GERD (gastroesophageal reflux disease)    Heart murmur    since birth never a problem per pt   History of DVT (deep vein thrombosis) 01/16/1969   History of hiatal hernia    History of transfusion    Hyperlipidemia    Hypertension    Interstitial cystitis    Dr Aleene   PONV (postoperative nausea and vomiting)     Past Surgical History:  Procedure Laterality Date   APPENDECTOMY     BILATERAL SALPINGOOPHORECTOMY     painful cysts   CATARACT EXTRACTION, BILATERAL     COLONOSCOPY  2005   negative; Dr Luis. F/U declined   CYSTOSCOPY     X 4; Dr Aleene   INSERTION OF MESH N/A 09/17/2021   Procedure: INSERTION OF MESH;  Surgeon: Sheldon Standing, MD;  Location: [facility];  Service: General;  Laterality: N/A;   LAPAROSCOPIC RIGHT COLECTOMY Right 08/19/2015   Procedure: LAPAROSCOPIC ASSISTED  RIGHT COLECTOMY;  Surgeon: Alm Angle, MD;  Location: [facility];  Service: General;  Laterality: Right;   LYSIS OF ADHESION N/A 09/17/2021   Procedure: LYSIS OF ADHESION;  Surgeon: Sheldon Standing, MD;  Location: [facility];  Service: General;  Laterality: N/A;   PREMAGLINANT POLYPS     TOTAL ABDOMINAL HYSTERECTOMY     metromenorrhagia   VENTRAL HERNIA REPAIR N/A 09/17/2021   Procedure: LAPAROSCOPIC VENTRAL WALL HERNIA REPAIR BILATERAL TAP BLOCK;  Surgeon: Sheldon Standing, MD;  Location: [facility];  Service: General;  Laterality: N/A;    Current Medications: Current Meds  Medication Sig   BD PEN NEEDLE  MICRO U/F 32G X 6 MM MISC Inject into the skin as directed.   Blood Glucose Monitoring Suppl (ONE TOUCH ULTRA 2) w/Device KIT 1 each by Does not apply route 4 (four) times daily. Use device to monitor glucose levels 4 times per day; E11.9   Blood Glucose Monitoring Suppl DEVI May substitute to any manufacturer covered by patient's insurance.   Dulaglutide  (TRULICITY ) 3 MG/0.5ML SOAJ Inject 3 mg as directed once a week.   EMBECTA PEN NEEDLE ULTRAFINE 32G X 6 MM MISC USE AS DIRECTED FOR SAXENDA INJECTIONS   ezetimibe  (ZETIA ) 10 MG tablet TAKE 1 TABLET BY MOUTH EVERY DAY   famotidine  (PEPCID ) 10 MG tablet Take 10 mg by mouth daily as needed for heartburn or indigestion.   gabapentin  (NEURONTIN ) 600 MG tablet Take 2 tablets (1,200 mg total) by mouth at bedtime.   glucose blood (ACCU-CHEK GUIDE TEST) test strip USE AS DIRECTED 3 TIMES A DAY (MORNING, NOON, AND BEDTIME)   Glucose Blood (BLOOD GLUCOSE TEST STRIPS) STRP May substitute to any manufacturer covered by patient's insurance. Patient may test up to two times daily as needed.   glucose blood (ONETOUCH ULTRA) test strip Check sugars 1-4 times daily prn  IDDM   hydrochlorothiazide  (HYDRODIURIL ) 25 MG tablet Take 1 tablet (25 mg total) by mouth daily.   Insulin  Pen Needle (BD PEN NEEDLE MICRO ULTRAFINE) 32G X 6 MM MISC For lantus  and humalog  Inject into skin as directed   Lancets (ONETOUCH DELICA PLUS LANCET33G) MISC USE TO TEST BLOOD SUGARS  ONCE DAILY   LANTUS  SOLOSTAR 100 UNIT/ML Solostar Pen INJECT 10 UNITS INTO THE SKIN IN THE MORNING.   olmesartan  (BENICAR ) 40 MG tablet Take 1 tablet (40 mg total) by mouth daily.   ondansetron  (ZOFRAN -ODT) 4 MG disintegrating tablet Take 1 tablet (4 mg total) by mouth every 8 (eight) hours as needed for nausea or vomiting. Please schedule an office visit for further refills.   rosuvastatin  (CRESTOR ) 5 MG tablet Take 1 tablet (5 mg total) by mouth daily.   tiZANidine  (ZANAFLEX ) 4 MG tablet Take 1 tablet (4 mg  total) by mouth every 8 (eight) hours as needed (back pain, muscle spasms).     Allergies:   Tramadol , Farxiga  [dapagliflozin ], Flagyl [metronidazole], Iodine, Jardiance  [empagliflozin ], Lisinopril , Cholestyramine, Metformin  and related, Prandin  [repaglinide ], and Sulfonamide derivatives   Social History   Socioeconomic History   Marital status: Widowed    Spouse name: Not on file   Number of children: 2   Years of education: Not on file   Highest education level: Bachelor's degree (e.g., BA, AB, BS)  Occupational History   Not on file  Tobacco Use   Smoking status: Former    Current packs/day: 0.00    Types: Cigarettes    Quit date: 05/18/1984    Years since quitting: 39.7   Smokeless tobacco: Never   Tobacco comments:    smoked 1972-1986, up to 1 ppd  Vaping  Use   Vaping status: Never Used  Substance and Sexual Activity   Alcohol use: No    Alcohol/week: 0.0 standard drinks of alcohol   Drug use: No   Sexual activity: Never  Other Topics Concern   Not on file  Social History Narrative   Lives with brother, daughter and 2 grandchildren in a 3 story home.  Has 2 children.  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     Highest level of education:  2 years of graduate school   Social Drivers of Health   Financial Resource Strain: Patient Declined (10/29/2023)   Overall Financial Resource Strain (CARDIA)    Difficulty of Paying Living Expenses: Patient declined  Food Insecurity: Patient Declined (10/29/2023)   Hunger Vital Sign    Worried About Running Out of Food in the Last Year: Patient declined    Ran Out of Food in the Last Year: Patient declined  Transportation Needs: No Transportation Needs (10/29/2023)   PRAPARE - Administrator, Civil Service (Medical): No    Lack of Transportation (Non-Medical): No  Physical Activity: Unknown (10/29/2023)   Exercise Vital Sign    Days of Exercise per Week: Patient declined     Minutes of Exercise per Session: Not on file  Stress: Stress Concern Present (10/29/2023)   [employer] of Occupational Health - Occupational Stress Questionnaire    Feeling of Stress: Rather much  Social Connections: Unknown (10/29/2023)   Social Connection and Isolation Panel    Frequency of Communication with Friends and Family: Patient declined    Frequency of Social Gatherings with Friends and Family: Patient declined    Attends Religious Services: Patient declined    Database administrator or Organizations: Patient declined    Attends Banker Meetings: Not on file    Marital Status: Widowed     Family History: The patient's family history includes Breast cancer in her sister; Healthy in her brother, daughter, and son; Heart attack in her mother and paternal grandfather; Hypertension in her mother; Other in her father; Stroke in her mother; Stroke (age of onset: 12) in her maternal grandfather. There is no history of Diabetes.  ROS:   Please see the history of present illness.    All other systems reviewed and are negative.  EKGs/Labs/Other Studies Reviewed:    The following studies were reviewed today:  EKG Interpretation Date/Time:  Monday February 21 2024 09:08:27 EDT Ventricular Rate:  70 PR Interval:  204 QRS Duration:  84 QT Interval:  378 QTC Calculation: 408 R Axis:   9  Text Interpretation: Normal sinus rhythm Cannot rule out Anterior infarct , age undetermined When compared with ECG of 26-Jul-2023 08:18, No significant change was found Confirmed by Viera Okonski [phone]) on 02/21/2024 9:14:28 AM    Recent Labs: 10/29/2023: ALT 11; BUN 23; Creat 1.00; Hemoglobin 11.1; Platelets 337; Potassium 4.0; Sodium 139; TSH 0.52  Recent Lipid Panel    Component Value Date/Time   CHOL 150 10/29/2023 0908   CHOL 333 (H) 03/15/2014 0810   TRIG 170 (H) 10/29/2023 0908   TRIG 263 (H) 03/15/2014 0810   HDL 64 10/29/2023 0908   HDL 63 03/15/2014 0810    CHOLHDL 2.3 10/29/2023 0908   VLDL 34.0 09/07/2023 1006   LDLCALC 61 10/29/2023 0908   LDLCALC 217 (H) 03/15/2014 0810   LDLDIRECT 108.0 04/27/2019 1040     Physical Exam:  VS:  BP 120/76 (BP Location: Left Arm, Patient Position: Sitting, Cuff Size: Normal)   Ht 5' (1.524 m)   Wt 172 lb 6.4 oz (78.2 kg)   SpO2 98%   BMI 33.67 kg/m     Wt Readings from Last 3 Encounters:  02/21/24 172 lb 6.4 oz (78.2 kg)  10/29/23 170 lb (77.1 kg)  08/23/23 175 lb (79.4 kg)     GEN:  Well nourished, well developed in no acute distress HEENT: Normal NECK: Possible left carotid bruit vs echo of murmur CARDIAC: RRR, systolic murmur 3/6, rubs, gallops RESPIRATORY:  Clear to auscultation without rales, wheezing or rhonchi  MUSCULOSKELETAL:  No edema; No deformity, feet warm, dry SKIN: Warm and dry NEUROLOGIC:  Alert and oriented x 3 PSYCHIATRIC:  Normal affect   ASSESSMENT:    1. Elevated coronary artery calcium  score   2. DOE (dyspnea on exertion)   3. Vertigo   4. Bruit of left carotid artery   5. Primary hypertension   6. Hyperlipidemia, unspecified hyperlipidemia type   7. Mild ascending aorta dilation   8. Systolic murmur   9. Aortic atherosclerosis    PLAN:    In order of problems listed above:  DOE Vertigo EKG: NSR 70 bpm, no significant change since prior study Echo 05/30/2023: LVEF 65-70%, no RWMA, G2 DD, mild-moderate AV stenosis Denies CP, palpitations, near syncope, nausea Reports DOE, orthopnea, new dizziness. Of note, she does report a history of vertigo.  Will order 2 week Zio monitoring to rule out arrhythmias Will repeat echo to evaluate G2DD, AS, and rule out cardiomyopathies Continue hydrochlorothiazide  25 mg daily Continue Olmesartan  40 mg daily  Carotid bruit, left Reports headaches over last several months unreposive to OTC medication New episodic dizziness over last 1.5 weeks, occurring spontaneously, unrelated to positional changes or hydration status.  No associated palpitations/CP  Possible left sided carotid bruit vs echo of AV stenosis No previous doppler study on file Will order bilateral carotid doppler study  Coronary calcium  score 154 in 04/2023 (65th percentile). EKG shows no significant change from prior study Cardiac catheterization 2012 normal She reports dyspnea on exertion as above, denies CP, palpitations, near syncope, nausea  Symptoms do not appear ischemic in nature. Repeat echo pending as above. Should she have progressive symptoms, and pending echo results, consider need for ischemic evaluation, will defer for now.  Continue Rosuvastatin  5 mg daily Continue Zetia  10 mg daily  HTN BPs reported as well controlled at home, normal in clinic today Continue olmesartan  40 mg daily Continue hydrochlorothiazide  25 mg daily Could consider addition of amlodipine 5 mg daily if not well-controlled Labs 10/29/2023: BUN 23, creatinine 1.0, GFR 59, sodium 139, potassium 4.0  HLD Labs 10/29/2023: Cholesterol 150, HDL 64, LDL 61, triglycerides 170 Continue rosuvastatin  5 mg daily Continue Zetia  10 mg daily  Valvular heart disease Ascending aortic dilatation of 38 mm Systolic murmur  Echo 05/2023: EF 65-70%, mild-moderate mitral valve regurg, mild-moderate tricuspid regurg, mild-moderate AV stenosis, mean gradient 18 mmHg, mild dilatation of ascending aorta measuring 38 mm Will repeat echo today  Aortic atherosclerosis CT for coronary calcium  scoring 04/2023 showed evidence of aortic atherosclerosis, as well as a mild dilatation of the ascending aorta measuring 38.9 mm.   BP stable  Continue Crestor  and Zetia  as prescribed.   Follow up in 3 months with MD   Medication Adjustments/Labs and Tests Ordered: Current medicines are reviewed at length with the patient today.  Concerns regarding medicines are outlined above.  Orders Placed This Encounter  Procedures   EKG 12-Lead   ECHOCARDIOGRAM COMPLETE   VAS US  CAROTID   No  orders of the defined types were placed in this encounter.   Patient Instructions  Medication Instructions:  Your physician recommends that you continue on your current medications as directed. Please refer to the Current Medication list given to you today.  *If you need a refill on your cardiac medications before your next appointment, please call your pharmacy*  Lab Work: NONE ordered at this time of appointment   Testing/Procedures: Your physician has requested that you have an echocardiogram. Echocardiography is a painless test that uses sound waves to create images of your heart. It provides your doctor with information about the size and shape of your heart and how well your heart's chambers and valves are working. This procedure takes approximately one hour. There are no restrictions for this procedure. Please do NOT wear cologne, perfume, aftershave, or lotions (deodorant is allowed). Please arrive 15 minutes prior to your appointment time.  Please note: We ask at that you not bring children with you during ultrasound (echo/ vascular) testing. Due to room size and safety concerns, children are not allowed in the ultrasound rooms during exams. Our front office staff cannot provide observation of children in our lobby area while testing is being conducted. An adult accompanying a patient to their appointment will only be allowed in the ultrasound room at the discretion of the ultrasound technician under special circumstances. We apologize for any inconvenience.  Your physician has requested that you have a carotid duplex. This test is an ultrasound of the carotid arteries in your neck. It looks at blood flow through these arteries that supply the brain with blood. Allow one hour for this exam. There are no restrictions or special instructions.   ZIO XT- Long Term Monitor Instructions  Your physician has requested you wear a ZIO patch monitor for 14 days.  This is a single patch monitor.  [employer] supplies one patch monitor per enrollment. Additional stickers are not available. Please do not apply patch if you will be having a Nuclear Stress Test,  Echocardiogram, Cardiac CT, MRI, or Chest Xray during the period you would be wearing the  monitor. The patch cannot be worn during these tests. You cannot remove and re-apply the  ZIO XT patch monitor.  Your ZIO patch monitor will be mailed 3 day USPS to your address on file. It may take 3-5 days  to receive your monitor after you have been enrolled.  Once you have received your monitor, please review the enclosed instructions. Your monitor  has already been registered assigning a specific monitor serial # to you.  Billing and Patient Assistance Program Information  We have supplied [employer] with any of your insurance information on file for billing purposes. [employer] offers a sliding scale Patient Assistance Program for patients that do not have  insurance, or whose insurance does not completely cover the cost of the ZIO monitor.  You must apply for the Patient Assistance Program to qualify for this discounted rate.  To apply, please call [employer] at [phone], select option 4, select option 2, ask to apply for  Patient Assistance Program. Meredeth will ask your household income, and how many people  are in your household. They will quote your out-of-pocket cost based on that information.  [employer] will also be able to set up a 106-month, interest-free payment plan if needed.  Applying the monitor   Shave hair  from upper left chest.  Hold abrader disc by orange tab. Rub abrader in 40 strokes over the upper left chest as  indicated in your monitor instructions.  Clean area with 4 enclosed alcohol pads. Let dry.  Apply patch as indicated in monitor instructions. Patch will be placed under collarbone on left  side of chest with arrow pointing upward.  Rub patch adhesive wings for 2 minutes. Remove white label marked 1. Remove the  white  label marked 2. Rub patch adhesive wings for 2 additional minutes.  While looking in a mirror, press and release button in center of patch. A small green light will  flash 3-4 times. This will be your only indicator that the monitor has been turned on.  Do not shower for the first 24 hours. You may shower after the first 24 hours.  Press the button if you feel a symptom. You will hear a small click. Record Date, Time and  Symptom in the Patient Logbook.  When you are ready to remove the patch, follow instructions on the last 2 pages of Patient  Logbook. Stick patch monitor onto the last page of Patient Logbook.  Place Patient Logbook in the blue and white box. Use locking tab on box and tape box closed  securely. The blue and white box has prepaid postage on it. Please place it in the mailbox as  soon as possible. Your physician should have your test results approximately 7 days after the  monitor has been mailed back to [facility].  Call [facility] Customer Care at [phone] if you have questions regarding  your ZIO XT patch monitor. Call them immediately if you see an orange light blinking on your  monitor.  If your monitor falls off in less than 4 days, contact our Monitor department at [phone].  If your monitor becomes loose or falls off after 4 days call [employer] at [phone] for  suggestions on securing your monitor   Follow-Up: At [facility], you and your health needs are our priority.  As part of our continuing mission to provide you with exceptional heart care, our providers are all part of one team.  This team includes your primary Cardiologist (physician) and Advanced Practice Providers or APPs (Physician Assistants and Nurse Practitioners) who all work together to provide you with the care you need, when you need it.  Your next appointment:   3 month(s)  Provider:   Vinie JAYSON Maxcy, MD    We recommend signing up for the patient portal  called MyChart.  Sign up information is provided on this After Visit Summary.  MyChart is used to connect with patients for Virtual Visits (Telemedicine).  Patients are able to view lab/test results, encounter notes, upcoming appointments, etc.  Non-urgent messages can be sent to your provider as well.   To learn more about what you can do with MyChart, go to [email or web address].              Signed, Miriam FORBES Shams, NP  02/21/2024 10:19 AM    [facility]

## 2024-02-21 ENCOUNTER — Other Ambulatory Visit: Payer: Self-pay | Admitting: Emergency Medicine

## 2024-02-21 ENCOUNTER — Ambulatory Visit: Attending: Nurse Practitioner | Admitting: Emergency Medicine

## 2024-02-21 ENCOUNTER — Ambulatory Visit

## 2024-02-21 ENCOUNTER — Encounter: Payer: Self-pay | Admitting: Nurse Practitioner

## 2024-02-21 VITALS — BP 120/76 | Ht 60.0 in | Wt 172.4 lb

## 2024-02-21 DIAGNOSIS — R0609 Other forms of dyspnea: Secondary | ICD-10-CM

## 2024-02-21 DIAGNOSIS — R011 Cardiac murmur, unspecified: Secondary | ICD-10-CM

## 2024-02-21 DIAGNOSIS — I10 Essential (primary) hypertension: Secondary | ICD-10-CM

## 2024-02-21 DIAGNOSIS — I700 Atherosclerosis of aorta: Secondary | ICD-10-CM | POA: Diagnosis not present

## 2024-02-21 DIAGNOSIS — E785 Hyperlipidemia, unspecified: Secondary | ICD-10-CM

## 2024-02-21 DIAGNOSIS — R0989 Other specified symptoms and signs involving the circulatory and respiratory systems: Secondary | ICD-10-CM | POA: Diagnosis not present

## 2024-02-21 DIAGNOSIS — R931 Abnormal findings on diagnostic imaging of heart and coronary circulation: Secondary | ICD-10-CM

## 2024-02-21 DIAGNOSIS — I77810 Thoracic aortic ectasia: Secondary | ICD-10-CM

## 2024-02-21 DIAGNOSIS — R42 Dizziness and giddiness: Secondary | ICD-10-CM

## 2024-02-21 NOTE — Patient Instructions (Signed)
 Medication Instructions:  Your physician recommends that you continue on your current medications as directed. Please refer to the Current Medication list given to you today.  *If you need a refill on your cardiac medications before your next appointment, please call your pharmacy*  Lab Work: NONE ordered at this time of appointment   Testing/Procedures: Your physician has requested that you have an echocardiogram. Echocardiography is a painless test that uses sound waves to create images of your heart. It provides your doctor with information about the size and shape of your heart and how well your heart's chambers and valves are working. This procedure takes approximately one hour. There are no restrictions for this procedure. Please do NOT wear cologne, perfume, aftershave, or lotions (deodorant is allowed). Please arrive 15 minutes prior to your appointment time.  Please note: We ask at that you not bring children with you during ultrasound (echo/ vascular) testing. Due to room size and safety concerns, children are not allowed in the ultrasound rooms during exams. Our front office staff cannot provide observation of children in our lobby area while testing is being conducted. An adult accompanying a patient to their appointment will only be allowed in the ultrasound room at the discretion of the ultrasound technician under special circumstances. We apologize for any inconvenience.  Your physician has requested that you have a carotid duplex. This test is an ultrasound of the carotid arteries in your neck. It looks at blood flow through these arteries that supply the brain with blood. Allow one hour for this exam. There are no restrictions or special instructions.   ZIO XT- Long Term Monitor Instructions  Your physician has requested you wear a ZIO patch monitor for 14 days.  This is a single patch monitor. [employer] supplies one patch monitor per enrollment. Additional stickers are not  available. Please do not apply patch if you will be having a Nuclear Stress Test,  Echocardiogram, Cardiac CT, MRI, or Chest Xray during the period you would be wearing the  monitor. The patch cannot be worn during these tests. You cannot remove and re-apply the  ZIO XT patch monitor.  Your ZIO patch monitor will be mailed 3 day USPS to your address on file. It may take 3-5 days  to receive your monitor after you have been enrolled.  Once you have received your monitor, please review the enclosed instructions. Your monitor  has already been registered assigning a specific monitor serial # to you.  Billing and Patient Assistance Program Information  We have supplied [employer] with any of your insurance information on file for billing purposes. [employer] offers a sliding scale Patient Assistance Program for patients that do not have  insurance, or whose insurance does not completely cover the cost of the ZIO monitor.  You must apply for the Patient Assistance Program to qualify for this discounted rate.  To apply, please call [employer] at [phone], select option 4, select option 2, ask to apply for  Patient Assistance Program. Meredeth will ask your household income, and how many people  are in your household. They will quote your out-of-pocket cost based on that information.  [employer] will also be able to set up a 85-month, interest-free payment plan if needed.  Applying the monitor   Shave hair from upper left chest.  Hold abrader disc by orange tab. Rub abrader in 40 strokes over the upper left chest as  indicated in your monitor instructions.  Clean area with 4 enclosed alcohol pads. Let dry.  Apply patch as indicated in monitor instructions. Patch will be placed under collarbone on left  side of chest with arrow pointing upward.  Rub patch adhesive wings for 2 minutes. Remove white label marked 1. Remove the white  label marked 2. Rub patch adhesive wings for 2 additional minutes.   While looking in a mirror, press and release button in center of patch. A small green light will  flash 3-4 times. This will be your only indicator that the monitor has been turned on.  Do not shower for the first 24 hours. You may shower after the first 24 hours.  Press the button if you feel a symptom. You will hear a small click. Record Date, Time and  Symptom in the Patient Logbook.  When you are ready to remove the patch, follow instructions on the last 2 pages of Patient  Logbook. Stick patch monitor onto the last page of Patient Logbook.  Place Patient Logbook in the blue and white box. Use locking tab on box and tape box closed  securely. The blue and white box has prepaid postage on it. Please place it in the mailbox as  soon as possible. Your physician should have your test results approximately 7 days after the  monitor has been mailed back to [facility].  Call [facility] Customer Care at [phone] if you have questions regarding  your ZIO XT patch monitor. Call them immediately if you see an orange light blinking on your  monitor.  If your monitor falls off in less than 4 days, contact our Monitor department at [phone].  If your monitor becomes loose or falls off after 4 days call [employer] at [phone] for  suggestions on securing your monitor   Follow-Up: At [facility], you and your health needs are our priority.  As part of our continuing mission to provide you with exceptional heart care, our providers are all part of one team.  This team includes your primary Cardiologist (physician) and Advanced Practice Providers or APPs (Physician Assistants and Nurse Practitioners) who all work together to provide you with the care you need, when you need it.  Your next appointment:   3 month(s)  Provider:   Vinie JAYSON Maxcy, MD    We recommend signing up for the patient portal called MyChart.  Sign up information is provided on this After Visit  Summary.  MyChart is used to connect with patients for Virtual Visits (Telemedicine).  Patients are able to view lab/test results, encounter notes, upcoming appointments, etc.  Non-urgent messages can be sent to your provider as well.   To learn more about what you can do with MyChart, go to [email or web address].

## 2024-02-21 NOTE — Progress Notes (Unsigned)
 Enrolled for [employer] to mail a ZIO XT long term holter monitor to the patients address on file.   Dr. Rennis Golden to read.

## 2024-03-02 ENCOUNTER — Encounter: Payer: Self-pay | Admitting: Internal Medicine

## 2024-03-02 DIAGNOSIS — Z1231 Encounter for screening mammogram for malignant neoplasm of breast: Secondary | ICD-10-CM | POA: Diagnosis not present

## 2024-03-02 LAB — HM MAMMOGRAPHY

## 2024-03-09 ENCOUNTER — Encounter: Payer: Self-pay | Admitting: *Deleted

## 2024-03-09 NOTE — Progress Notes (Signed)
 Heather [name]                                          MRN: [id number]   03/09/2024   The VBCI Quality Team Specialist reviewed this patient medical record for the purposes of chart review for care gap closure. The following were reviewed: abstraction for care gap closure-glycemic status assessment.    VBCI Quality Team

## 2024-03-20 ENCOUNTER — Encounter: Payer: Self-pay | Admitting: Radiology

## 2024-03-27 ENCOUNTER — Ambulatory Visit (HOSPITAL_COMMUNITY)
Admission: RE | Admit: 2024-03-27 | Discharge: 2024-03-27 | Disposition: A | Discharge status: Home / Self Care | Source: Ambulatory Visit | Attending: Emergency Medicine | Admitting: Emergency Medicine

## 2024-03-27 ENCOUNTER — Ambulatory Visit: Payer: Self-pay | Admitting: Emergency Medicine

## 2024-03-27 ENCOUNTER — Ambulatory Visit (HOSPITAL_BASED_OUTPATIENT_CLINIC_OR_DEPARTMENT_OTHER)
Admission: RE | Admit: 2024-03-27 | Discharge: 2024-03-27 | Disposition: A | Discharge status: Home / Self Care | Source: Ambulatory Visit | Attending: Emergency Medicine | Admitting: Emergency Medicine

## 2024-03-27 DIAGNOSIS — R42 Dizziness and giddiness: Secondary | ICD-10-CM | POA: Diagnosis not present

## 2024-03-27 DIAGNOSIS — R0989 Other specified symptoms and signs involving the circulatory and respiratory systems: Secondary | ICD-10-CM | POA: Diagnosis present

## 2024-03-27 DIAGNOSIS — R0609 Other forms of dyspnea: Secondary | ICD-10-CM

## 2024-03-27 LAB — ECHOCARDIOGRAM COMPLETE
AR max vel: 0.94 cm2
AV Area VTI: 0.97 cm2
AV Area mean vel: 0.96 cm2
AV Mean grad: 15.2 mmHg
AV Peak grad: 28.7 mmHg
Ao pk vel: 2.68 m/s
Area-P 1/2: 3.6 cm2
MV M vel: 4.54 m/s
MV Peak grad: 82.4 mmHg
MV VTI: 0.66 cm2
Radius: 0.72 cm
S' Lateral: 2.08 cm

## 2024-03-28 ENCOUNTER — Ambulatory Visit: Payer: Self-pay | Admitting: Emergency Medicine

## 2024-03-28 MED ORDER — METOPROLOL SUCCINATE ER 25 MG PO TB24: 12.5000 mg | ORAL_TABLET | Freq: Every day | ORAL | 3 refills | Status: AC

## 2024-03-28 NOTE — Telephone Encounter (Signed)
 Spoke with pt regarding monitor results. Pt verbalized understanding and agreed to plan. Medication sent to [facility] [address] per pts request. Pt was advised to call our office if she has any concerns.

## 2024-04-02 ENCOUNTER — Other Ambulatory Visit: Payer: Self-pay | Admitting: Internal Medicine

## 2024-04-03 ENCOUNTER — Other Ambulatory Visit: Payer: Self-pay | Admitting: Internal Medicine

## 2024-04-05 ENCOUNTER — Other Ambulatory Visit: Payer: Self-pay | Admitting: Internal Medicine

## 2024-04-21 ENCOUNTER — Encounter (HOSPITAL_COMMUNITY): Payer: Self-pay | Admitting: Surgery

## 2024-04-30 ENCOUNTER — Encounter: Payer: Self-pay | Admitting: Internal Medicine

## 2024-04-30 NOTE — Patient Instructions (Addendum)
° ° ° ° °  Blood work was ordered.       Medications changes include :   mounjaro  2.5 mg weekly, increase lantus  to 12 units - we will adjust that as needed     Return in about 6 months (around 10/30/2024) for Physical Exam.

## 2024-04-30 NOTE — Progress Notes (Unsigned)
 Subjective:    Patient ID: Heather [name], female    DOB: 1950/05/24, 73 y.o.   MRN: [id number]     HPI Murel is here for follow up of her chronic medical problems.  S/p  L4-5 fusion in April 2025  Medications and allergies reviewed with patient and updated if appropriate.  Medications Ordered Prior to Encounter[1]   Review of Systems     Objective:  There were no vitals filed for this visit. BP Readings from Last 3 Encounters:  02/21/24 120/76  10/29/23 120/82  08/24/23 (!) 104/56   Wt Readings from Last 3 Encounters:  02/21/24 172 lb 6.4 oz (78.2 kg)  10/29/23 170 lb (77.1 kg)  08/23/23 175 lb (79.4 kg)   There is no height or weight on file to calculate BMI.    Physical Exam     Lab Results  Component Value Date   WBC 6.5 10/29/2023   HGB 11.1 (L) 10/29/2023   HCT 35.7 10/29/2023   PLT 337 10/29/2023   GLUCOSE 119 (H) 10/29/2023   CHOL 150 10/29/2023   TRIG 170 (H) 10/29/2023   HDL 64 10/29/2023   LDLDIRECT 108.0 04/27/2019   LDLCALC 61 10/29/2023   ALT 11 10/29/2023   AST 13 10/29/2023   NA 139 10/29/2023   K 4.0 10/29/2023   CL 102 10/29/2023   CREATININE 1.00 10/29/2023   BUN 23 10/29/2023   CO2 28 10/29/2023   TSH 0.52 10/29/2023   HGBA1C 7.0 (H) 10/29/2023     Assessment & Plan:    See Problem List for Assessment and Plan of chronic medical problems.       [1]  Current Outpatient Medications on File Prior to Visit  Medication Sig Dispense Refill   BD PEN NEEDLE MICRO U/F 32G X 6 MM MISC Inject into the skin as directed. 100 each 3   Blood Glucose Monitoring Suppl (ONE TOUCH ULTRA 2) w/Device KIT 1 each by Does not apply route 4 (four) times daily. Use device to monitor glucose levels 4 times per day; E11.9 1 kit 0   Blood Glucose Monitoring Suppl DEVI May substitute to any manufacturer covered by patient's insurance. 1 each 0   Dulaglutide  (TRULICITY ) 3 MG/0.5ML SOAJ Inject 3 mg as directed once a week. 6 mL 0    EMBECTA PEN NEEDLE ULTRAFINE 32G X 6 MM MISC USE AS DIRECTED FOR SAXENDA INJECTIONS 100 each 5   ezetimibe  (ZETIA ) 10 MG tablet TAKE 1 TABLET BY MOUTH EVERY DAY 90 tablet 3   famotidine  (PEPCID ) 10 MG tablet Take 10 mg by mouth daily as needed for heartburn or indigestion.     gabapentin  (NEURONTIN ) 600 MG tablet Take 2 tablets (1,200 mg total) by mouth at bedtime. 180 tablet 1   glucose blood (ACCU-CHEK GUIDE TEST) test strip USE AS DIRECTED 3 TIMES A DAY (MORNING, NOON, AND BEDTIME) 100 strip 5   Glucose Blood (BLOOD GLUCOSE TEST STRIPS) STRP May substitute to any manufacturer covered by patient's insurance. Patient may test up to two times daily as needed. 200 strip 11   glucose blood (ONETOUCH ULTRA) test strip Check sugars 1-4 times daily prn  IDDM 300 strip 3   hydrochlorothiazide  (HYDRODIURIL ) 25 MG tablet Take 1 tablet (25 mg total) by mouth daily. 90 tablet 2   insulin  lispro (HUMALOG  KWIKPEN) 100 UNIT/ML KwikPen Using as directed with SS   Inject 4-10 units into the skin 3 times a day before meals prn   <  150  0 units 151-200  2 units 201-250  4 units   >250      8 units (Patient not taking: Reported on 02/21/2024) 15 mL 11   Insulin  Pen Needle (BD PEN NEEDLE MICRO ULTRAFINE) 32G X 6 MM MISC For lantus  and humalog  Inject into skin as directed 300 each 5   Lancets (ONETOUCH DELICA PLUS LANCET33G) MISC USE TO TEST BLOOD SUGARS  ONCE DAILY 100 each 2   LANTUS  SOLOSTAR 100 UNIT/ML Solostar Pen INJECT 10 UNITS INTO THE SKIN IN THE MORNING. 6 mL 1   metoprolol  succinate (TOPROL  XL) 25 MG 24 hr tablet Take 0.5 tablets (12.5 mg total) by mouth daily. 90 tablet 3   olmesartan  (BENICAR ) 40 MG tablet Take 1 tablet (40 mg total) by mouth daily. 90 tablet 3   ondansetron  (ZOFRAN -ODT) 4 MG disintegrating tablet Take 1 tablet (4 mg total) by mouth every 8 (eight) hours as needed for nausea or vomiting. Please schedule an office visit for further refills. 20 tablet 0   rosuvastatin  (CRESTOR ) 5 MG tablet Take  1 tablet (5 mg total) by mouth daily. 90 tablet 2   tiZANidine  (ZANAFLEX ) 4 MG tablet Take 1 tablet (4 mg total) by mouth every 8 (eight) hours as needed (back pain, muscle spasms). 60 tablet 2   No current facility-administered medications on file prior to visit.

## 2024-05-01 ENCOUNTER — Ambulatory Visit: Payer: Self-pay | Admitting: Internal Medicine

## 2024-05-01 ENCOUNTER — Ambulatory Visit: Admitting: Internal Medicine

## 2024-05-01 VITALS — BP 124/68 | HR 65 | Temp 98.0°F | Ht 60.0 in | Wt 177.0 lb

## 2024-05-01 DIAGNOSIS — I2510 Atherosclerotic heart disease of native coronary artery without angina pectoris: Secondary | ICD-10-CM | POA: Insufficient documentation

## 2024-05-01 DIAGNOSIS — I152 Hypertension secondary to endocrine disorders: Secondary | ICD-10-CM

## 2024-05-01 DIAGNOSIS — E1140 Type 2 diabetes mellitus with diabetic neuropathy, unspecified: Secondary | ICD-10-CM

## 2024-05-01 DIAGNOSIS — E559 Vitamin D deficiency, unspecified: Secondary | ICD-10-CM

## 2024-05-01 DIAGNOSIS — E1169 Type 2 diabetes mellitus with other specified complication: Secondary | ICD-10-CM

## 2024-05-01 DIAGNOSIS — E0842 Diabetes mellitus due to underlying condition with diabetic polyneuropathy: Secondary | ICD-10-CM

## 2024-05-01 DIAGNOSIS — E66811 Obesity, class 1: Secondary | ICD-10-CM

## 2024-05-01 LAB — COMPREHENSIVE METABOLIC PANEL WITH GFR
ALT: 12 U/L (ref 0–35)
AST: 17 U/L (ref 0–37)
Albumin: 4.6 g/dL (ref 3.5–5.2)
Alkaline Phosphatase: 51 U/L (ref 39–117)
BUN: 16 mg/dL (ref 6–23)
CO2: 29 meq/L (ref 19–32)
Calcium: 10 mg/dL (ref 8.4–10.5)
Chloride: 99 meq/L (ref 96–112)
Creatinine, Ser: 0.87 mg/dL (ref 0.40–1.20)
GFR: 65.1 mL/min (ref >60.00)
Glucose, Bld: 104 mg/dL — ABNORMAL HIGH (ref 70–99)
Potassium: 4.1 meq/L (ref 3.5–5.1)
Sodium: 134 meq/L — ABNORMAL LOW (ref 135–145)
Total Bilirubin: 1.1 mg/dL (ref 0.2–1.2)
Total Protein: 7.4 g/dL (ref 6.0–8.3)

## 2024-05-01 LAB — CBC WITH DIFFERENTIAL/PLATELET
Basophils Absolute: 0 K/uL (ref 0.0–0.1)
Basophils Relative: 0.4 % (ref 0.0–3.0)
Eosinophils Absolute: 0.2 K/uL (ref 0.0–0.7)
Eosinophils Relative: 2.9 % (ref 0.0–5.0)
HCT: 35.3 % — ABNORMAL LOW (ref 36.0–46.0)
Hemoglobin: 12.1 g/dL (ref 12.0–15.0)
Lymphocytes Relative: 32.2 % (ref 12.0–46.0)
Lymphs Abs: 2.3 K/uL (ref 0.7–4.0)
MCHC: 34.4 g/dL (ref 30.0–36.0)
MCV: 77.7 fl — ABNORMAL LOW (ref 78.0–100.0)
Monocytes Absolute: 0.6 K/uL (ref 0.1–1.0)
Monocytes Relative: 8.3 % (ref 3.0–12.0)
Neutro Abs: 4 K/uL (ref 1.4–7.7)
Neutrophils Relative %: 56.2 % (ref 43.0–77.0)
Platelets: 300 K/uL (ref 150.0–400.0)
RBC: 4.55 Mil/uL (ref 3.87–5.11)
RDW: 14.2 % (ref 11.5–15.5)
WBC: 7.1 K/uL (ref 4.0–10.5)

## 2024-05-01 LAB — LIPID PANEL
Cholesterol: 146 mg/dL (ref 0–200)
HDL: 56.5 mg/dL (ref >39.00)
LDL Cholesterol: 44 mg/dL (ref 0–99)
NonHDL: 89
Total CHOL/HDL Ratio: 3
Triglycerides: 224 mg/dL — ABNORMAL HIGH (ref 0.0–149.0)
VLDL: 44.8 mg/dL — ABNORMAL HIGH (ref 0.0–40.0)

## 2024-05-01 LAB — HEMOGLOBIN A1C: Hgb A1c MFr Bld: 6.7 % — ABNORMAL HIGH (ref 4.6–6.5)

## 2024-05-01 LAB — VITAMIN D 25 HYDROXY (VIT D DEFICIENCY, FRACTURES): VITD: 23.21 ng/mL — ABNORMAL LOW (ref 30.00–100.00)

## 2024-05-01 MED ORDER — ROSUVASTATIN CALCIUM 5 MG PO TABS: 5.0000 mg | ORAL_TABLET | Freq: Every day | ORAL | 2 refills | Status: AC

## 2024-05-01 MED ORDER — TIRZEPATIDE 2.5 MG/0.5ML ~~LOC~~ SOAJ: 2.5000 mg | SUBCUTANEOUS | 0 refills | Status: DC

## 2024-05-01 MED ORDER — GABAPENTIN 600 MG PO TABS: 1200.0000 mg | ORAL_TABLET | Freq: Every day | ORAL | 1 refills | Status: AC

## 2024-05-01 MED ORDER — HYDROCHLOROTHIAZIDE 25 MG PO TABS: 25.0000 mg | ORAL_TABLET | Freq: Every day | ORAL | 2 refills | Status: AC

## 2024-05-01 MED ORDER — LANTUS SOLOSTAR 100 UNIT/ML ~~LOC~~ SOPN: 12.0000 [IU] | PEN_INJECTOR | Freq: Every morning | SUBCUTANEOUS | Status: AC

## 2024-05-01 NOTE — Assessment & Plan Note (Signed)
 Chronic Associated with CAD, diabetes Lab Results  Component Value Date   LDLCALC 61 10/29/2023    Check lipid panel, cmp Continue Zetia  10 mg daily, Crestor  5 mg daily

## 2024-05-01 NOTE — Assessment & Plan Note (Signed)
 Chronic Taking vitamin D daily Check vitamin D level

## 2024-05-01 NOTE — Assessment & Plan Note (Addendum)
 Chronic Associated with diabetic neuropathy, CAD, HTN, HLD Lab Results  Component Value Date   HGBA1C 7.0 (H) 10/29/2023   Sugars not ideally controlled with last blood work She monitor sugars at home-sugars higher than ideal at home On insulin  - needs to check sugars 1-4 times a day Check A1c Encouraged regular exercise, diabetic diet Discontinue Trulicity  3 mg weekly Start mounjaro  2.5 mg weekly - titrate as needed Increase lantus  to 12 units daily, continue Humalog  sliding scale Long-term goal would be to slowly increase Mounjaro  and to hopefully get her off the insulin 

## 2024-05-01 NOTE — Assessment & Plan Note (Signed)
 Chronic Mild disease by Ct cac No symptoms c/w angina Continue Crestor  5 mg daily, Zetia  10 mg daily Check lipids, CMP Healthy diet, regular exercise encouraged

## 2024-05-01 NOTE — Assessment & Plan Note (Addendum)
 Chronic BMI 34.57  Working on weight loss On Trulicity  for her diabetes She is doing yard work and trying to eat healthy and eat less-she will continue her weight loss efforts

## 2024-05-01 NOTE — Assessment & Plan Note (Signed)
 Chronic Blood pressure controlled Cmp, cbc Continue hydrochlorothiazide  25 mg daily, olmesartan  40 mg daily Monitor BP at home

## 2024-05-01 NOTE — Assessment & Plan Note (Signed)
 Chronic Stressed good sugar control Continue gabapentin  1200 mg at bedtime

## 2024-05-18 ENCOUNTER — Encounter: Payer: Self-pay | Admitting: Internal Medicine

## 2024-05-18 DIAGNOSIS — E1169 Type 2 diabetes mellitus with other specified complication: Secondary | ICD-10-CM

## 2024-05-18 DIAGNOSIS — I2510 Atherosclerotic heart disease of native coronary artery without angina pectoris: Secondary | ICD-10-CM

## 2024-05-18 DIAGNOSIS — Z981 Arthrodesis status: Secondary | ICD-10-CM

## 2024-05-18 DIAGNOSIS — M4316 Spondylolisthesis, lumbar region: Secondary | ICD-10-CM

## 2024-05-19 ENCOUNTER — Other Ambulatory Visit: Payer: Self-pay | Admitting: Family

## 2024-05-19 MED ORDER — TIRZEPATIDE 5 MG/0.5ML ~~LOC~~ SOAJ: 5.0000 mg | SUBCUTANEOUS | 1 refills | Status: AC

## 2024-06-02 ENCOUNTER — Ambulatory Visit: Attending: Internal Medicine | Admitting: Internal Medicine

## 2024-06-02 ENCOUNTER — Encounter: Payer: Self-pay | Admitting: Internal Medicine

## 2024-06-02 VITALS — BP 124/80 | HR 69 | Ht 60.0 in | Wt 181.4 lb

## 2024-06-02 DIAGNOSIS — E781 Pure hyperglyceridemia: Secondary | ICD-10-CM

## 2024-06-02 DIAGNOSIS — I77810 Thoracic aortic ectasia: Secondary | ICD-10-CM

## 2024-06-02 DIAGNOSIS — I350 Nonrheumatic aortic (valve) stenosis: Secondary | ICD-10-CM

## 2024-06-02 DIAGNOSIS — Z794 Long term (current) use of insulin: Secondary | ICD-10-CM | POA: Diagnosis not present

## 2024-06-02 DIAGNOSIS — E785 Hyperlipidemia, unspecified: Secondary | ICD-10-CM | POA: Diagnosis not present

## 2024-06-02 DIAGNOSIS — E119 Type 2 diabetes mellitus without complications: Secondary | ICD-10-CM | POA: Diagnosis not present

## 2024-06-02 MED ORDER — ICOSAPENT ETHYL 1 G PO CAPS: 2.0000 g | ORAL_CAPSULE | Freq: Two times a day (BID) | ORAL | 3 refills | Status: AC

## 2024-06-02 NOTE — Progress Notes (Signed)
 "  OFFICE NOTE  Chief Complaint:  Follow-up  Primary Care Physician: Geofm Glade PARAS, MD  HPI:  Heather [name] is a 74 y.o. female with a past medial history significant for valvular heart disease with mild to moderate aortic stenosis noted on prior echoes both in November 2025 and January 2025 which appears stable.  The mean gradient was 15.2 mmHg.  She is asymptomatic with this.  She was initially seen for preoperative clearance.  Other risk factors include hypertension, dyslipidemia and she has known aortic atherosclerosis as well as a large hiatal hernia seen on a calcium  score in 2024.  Her calcium  score was 154, 65th percentile for age and sex matched controls.  Her most recent lipid testing was in December 2025 showing total cholesterol 146, HDL 56, triglycerides 224 and LDL 44.  She is on rosuvastatin  5 mg daily and ezetimibe  10 mg daily.  Hemoglobin A1c in December was 6.7% although her daily blood sugar does fluctuate  PMHx:  Past Medical History:  Diagnosis Date   Aortic stenosis    Arthritis    Colon polyps    adenomas- took 1/3 of colon   Coronary artery disease    Diabetes mellitus without complication (HCC)    Environmental allergies    GERD (gastroesophageal reflux disease)    Heart murmur    since birth never a problem per pt   History of DVT (deep vein thrombosis) 01/16/1969   History of hiatal hernia    History of transfusion    Hyperlipidemia    Hypertension    Interstitial cystitis    Dr Aleene   PONV (postoperative nausea and vomiting)     Past Surgical History:  Procedure Laterality Date   APPENDECTOMY     BILATERAL SALPINGOOPHORECTOMY     painful cysts   CATARACT EXTRACTION, BILATERAL     COLONOSCOPY  2005   negative; Dr Luis. F/U declined   CYSTOSCOPY     X 4; Dr Aleene   INSERTION OF MESH N/A 09/17/2021   Procedure: INSERTION OF MESH;  Surgeon: Sheldon Standing, MD;  Location: [facility];  Service: General;  Laterality: N/A;   LAPAROSCOPIC RIGHT  COLECTOMY Right 08/19/2015   Procedure: LAPAROSCOPIC ASSISTED  RIGHT COLECTOMY;  Surgeon: Alm Angle, MD;  Location: [facility];  Service: General;  Laterality: Right;   LYSIS OF ADHESION N/A 09/17/2021   Procedure: LYSIS OF ADHESION;  Surgeon: Sheldon Standing, MD;  Location: [facility];  Service: General;  Laterality: N/A;   PREMAGLINANT POLYPS     TOTAL ABDOMINAL HYSTERECTOMY     metromenorrhagia   VENTRAL HERNIA REPAIR N/A 09/17/2021   Procedure: LAPAROSCOPIC VENTRAL WALL HERNIA REPAIR BILATERAL TAP BLOCK;  Surgeon: Sheldon Standing, MD;  Location: [facility];  Service: General;  Laterality: N/A;    FAMHx:  Family History  Problem Relation Age of Onset   Heart attack Mother        in 52s   Hypertension Mother    Stroke Mother         in 79s   Other Father        Deceased, 72   Breast cancer Sister    Healthy Brother    Healthy Daughter    Healthy Son    Stroke Maternal Grandfather 50   Heart attack Paternal Grandfather        in 15s   Diabetes Neg Hx     SOCHx:   reports that she quit smoking about 40 years ago. Her smoking  use included cigarettes. She has never used smokeless tobacco. She reports that she does not drink alcohol and does not use drugs.  ALLERGIES:  Allergies[1]  ROS: Pertinent items noted in HPI and remainder of comprehensive ROS otherwise negative.  HOME MEDS: Medications Ordered Prior to Encounter[2]  LABS/IMAGING: No results found for this or any previous visit (from the past 48 hours). No results found.  LIPID PANEL:    Component Value Date/Time   CHOL 146 05/01/2024 0837   CHOL 333 (H) 03/15/2014 0810   TRIG 224.0 (H) 05/01/2024 0837   TRIG 263 (H) 03/15/2014 0810   HDL 56.50 05/01/2024 0837   HDL 63 03/15/2014 0810   CHOLHDL 3 05/01/2024 0837   VLDL 44.8 (H) 05/01/2024 0837   LDLCALC 44 05/01/2024 0837   LDLCALC 61 10/29/2023 0908   LDLCALC 217 (H) 03/15/2014 0810   LDLDIRECT 108.0 04/27/2019 1040    No results found for: LIPOA      WEIGHTS: Wt Readings from Last 3 Encounters:  06/02/24 181 lb 6.4 oz (82.3 kg)  05/01/24 177 lb (80.3 kg)  02/21/24 172 lb 6.4 oz (78.2 kg)    VITALS: BP 124/80   Pulse 69   Ht 5' (1.524 m)   Wt 181 lb 6.4 oz (82.3 kg)   SpO2 97%   BMI 35.43 kg/m   EXAM: General appearance: alert, no distress, and moderately obese Lungs: clear to auscultation bilaterally Heart: regular rate and rhythm, S1, S2 normal, and systolic murmur: early systolic 3/6, crescendo at 2nd right intercostal space Extremities: extremities normal, atraumatic, no cyanosis or edema Neurologic: Grossly normal  EKG: Deferred  ASSESSMENT: Aortic stenosis CAC score of 154, 65th percentile (04/2023) Aortic atherosclerosis Dyslipidemia, goal LDL less than 70 Type 2 diabetes-A1c 6.7% Moderate obesity Hypertension  PLAN: 1.   Heather [name] has aortic stenosis which will need to be followed annually.  Her calcium  score was elevated but she is asymptomatic.  Her LDL is at goal less than 70 but triglycerides remain elevated.  Since she has diabetes and coronary artery disease she would benefit from Vascepa  for additional triglyceride lowering and antioxidant effects.  I have recommended 2 g twice daily.  Will reach out for prior authorization for this.  Plan repeat lipids in about 3 months and follow-up in 6 months with APP.  Plan repeat echo in a year which will be October or November 2026.  Vinie KYM Maxcy, MD, [facility], FNLA, FACP    [facility]  Medical Director of the [facility] Lip[facility]  Card[facility]ovascular Risk Reduction [facility] Diplomate of the American Board of Clinical Lipidology Attending Cardiologist  Direct Dial : [phone]  Fax: [phone]  Website:  [email or web address]   Vinie BROCKS Lenoria Narine 06/02/2024, 9:15 AM    [1]  Allergies Allergen Reactions   Tramadol  Nausea And Vomiting   Farxiga  [Dapagliflozin ]     Constant Vaginal Infections   Flagyl [Metronidazole] Other (See Comments)     Made really sick. Was able to tolerate dose 4/17 with zofran    Iodine Other (See Comments)    breaks out in blisters if stays on skin   Jardiance  [Empagliflozin ]     Constant Vaginal infections   Lisinopril      04/03/15 cough reported X several months   Cholestyramine Other (See Comments)    Joint pain   Metformin  And Related Other (See Comments)    Loose stools   Prandin  [Repaglinide ] Nausea Only    Lowered Blood Sugar    Sulfonamide Derivatives Nausea Only  [  2]  Current Outpatient Medications on File Prior to Visit  Medication Sig Dispense Refill   BD PEN NEEDLE MICRO U/F 32G X 6 MM MISC Inject into the skin as directed. 100 each 3   Blood Glucose Monitoring Suppl DEVI May substitute to any manufacturer covered by patient's insurance. 1 each 0   EMBECTA PEN NEEDLE ULTRAFINE 32G X 6 MM MISC USE AS DIRECTED FOR SAXENDA INJECTIONS 100 each 5   ezetimibe  (ZETIA ) 10 MG tablet TAKE 1 TABLET BY MOUTH EVERY DAY 90 tablet 3   famotidine  (PEPCID ) 10 MG tablet Take 10 mg by mouth daily as needed for heartburn or indigestion.     gabapentin  (NEURONTIN ) 600 MG tablet Take 2 tablets (1,200 mg total) by mouth at bedtime. 180 tablet 1   glucose blood (ACCU-CHEK GUIDE TEST) test strip USE AS DIRECTED 3 TIMES A DAY (MORNING, NOON, AND BEDTIME) 100 strip 5   Glucose Blood (BLOOD GLUCOSE TEST STRIPS) STRP May substitute to any manufacturer covered by patient's insurance. Patient may test up to two times daily as needed. 200 strip 11   glucose blood (ONETOUCH ULTRA) test strip Check sugars 1-4 times daily prn  IDDM 300 strip 3   hydrochlorothiazide  (HYDRODIURIL ) 25 MG tablet Take 1 tablet (25 mg total) by mouth daily. 90 tablet 2   insulin  lispro (HUMALOG  KWIKPEN) 100 UNIT/ML KwikPen Using as directed with SS   Inject 4-10 units into the skin 3 times a day before meals prn   < 150  0 units 151-200  2 units 201-250  4 units   >250      8 units 15 mL 11   Insulin  Pen Needle (BD PEN NEEDLE MICRO ULTRAFINE)  32G X 6 MM MISC For lantus  and humalog  Inject into skin as directed 300 each 5   Lancets (ONETOUCH DELICA PLUS LANCET33G) MISC USE TO TEST BLOOD SUGARS  ONCE DAILY 100 each 2   LANTUS  SOLOSTAR 100 UNIT/ML Solostar Pen Inject 12 Units into the skin in the morning.     metoprolol  succinate (TOPROL  XL) 25 MG 24 hr tablet Take 0.5 tablets (12.5 mg total) by mouth daily. 90 tablet 3   olmesartan  (BENICAR ) 40 MG tablet Take 1 tablet (40 mg total) by mouth daily. 90 tablet 3   ondansetron  (ZOFRAN -ODT) 4 MG disintegrating tablet Take 1 tablet (4 mg total) by mouth every 8 (eight) hours as needed for nausea or vomiting. Please schedule an office visit for further refills. 20 tablet 0   rosuvastatin  (CRESTOR ) 5 MG tablet Take 1 tablet (5 mg total) by mouth daily. 90 tablet 2   tirzepatide  (MOUNJARO ) 5 MG/0.5ML Pen Inject 5 mg into the skin once a week. 6 mL 1   tiZANidine  (ZANAFLEX ) 4 MG tablet Take 1 tablet (4 mg total) by mouth every 8 (eight) hours as needed (back pain, muscle spasms). 60 tablet 2   Blood Glucose Monitoring Suppl (ONE TOUCH ULTRA 2) w/Device KIT 1 each by Does not apply route 4 (four) times daily. Use device to monitor glucose levels 4 times per day; E11.9 1 kit 0   No current facility-administered medications on file prior to visit.   "

## 2024-06-02 NOTE — Patient Instructions (Addendum)
 Medication Instructions:  Start Vascepa  2G 2 times per day. *If you need a refill on your cardiac medications before your next appointment, please call your pharmacy*  Lab Work: Lipid Panel in 3 months.  If you have labs (blood work) drawn today and your tests are completely normal, you will receive your results only by: MyChart Message (if you have MyChart) OR A paper copy in the mail If you have any lab test that is abnormal or we need to change your treatment, we will call you to review the results.  Testing/Procedures: Echocardiogram in October 2026. Your physician has requested that you have an echocardiogram. Echocardiography is a painless test that uses sound waves to create images of your heart. It provides your doctor with information about the size and shape of your heart and how well your hearts chambers and valves are working. This procedure takes approximately one hour. There are no restrictions for this procedure. Please do NOT wear cologne, perfume, aftershave, or lotions (deodorant is allowed). Please arrive 15 minutes prior to your appointment time.  Please note: We ask at that you not bring children with you during ultrasound (echo/ vascular) testing. Due to room size and safety concerns, children are not allowed in the ultrasound rooms during exams. Our front office staff cannot provide observation of children in our lobby area while testing is being conducted. An adult accompanying a patient to their appointment will only be allowed in the ultrasound room at the discretion of the ultrasound technician under special circumstances. We apologize for any inconvenience.   Follow-Up: At [facility], you and your health needs are our priority.  As part of our continuing mission to provide you with exceptional heart care, our providers are all part of one team.  This team includes your primary Cardiologist (physician) and Advanced Practice Providers or APPs (Physician  Assistants and Nurse Practitioners) who all work together to provide you with the care you need, when you need it.  Your next appointment:   6 month(s)  Provider:   Miriam Shams, NP          We recommend signing up for the patient portal called MyChart.  Sign up information is provided on this After Visit Summary.  MyChart is used to connect with patients for Virtual Visits (Telemedicine).  Patients are able to view lab/test results, encounter notes, upcoming appointments, etc.  Non-urgent messages can be sent to your provider as well.   To learn more about what you can do with MyChart, go to [email or web address].   Other Instructions None

## 2024-06-16 ENCOUNTER — Encounter: Payer: Self-pay | Admitting: Internal Medicine

## 2024-06-19 ENCOUNTER — Ambulatory Visit: Payer: Medicare Other

## 2024-06-19 ENCOUNTER — Other Ambulatory Visit (HOSPITAL_COMMUNITY): Payer: Self-pay

## 2024-06-19 ENCOUNTER — Telehealth: Payer: Self-pay | Admitting: Pharmacy Technician

## 2024-06-19 NOTE — Telephone Encounter (Signed)
 Pharmacy Patient Advocate Encounter   Received notification from Patient Advice Request messages that prior authorization for vascepa  is required/requested.   Insurance verification completed.   The patient is insured through [employer].   Per test claim: PA required; PA submitted to above mentioned insurance via Latent Key/confirmation #/[id number] Status is pending

## 2024-06-23 ENCOUNTER — Ambulatory Visit

## 2024-06-23 VITALS — BP 110/60 | HR 65 | Ht <= 58 in | Wt 172.6 lb

## 2024-06-23 DIAGNOSIS — Z0000 Encounter for general adult medical examination without abnormal findings: Secondary | ICD-10-CM

## 2024-06-23 DIAGNOSIS — Z780 Asymptomatic menopausal state: Secondary | ICD-10-CM

## 2024-06-23 NOTE — Patient Instructions (Addendum)
 Ms. Neglia,  Thank you for taking the time for your Medicare Wellness Visit. I appreciate your continued commitment to your health goals. Please review the care plan we discussed, and feel free to reach out if I can assist you further.  Please note that Annual Wellness Visits do not include a physical exam. Some assessments may be limited, especially if the visit was conducted virtually. If needed, we may recommend an in-person follow-up with your provider.  Ongoing Care Seeing your primary care provider every 3 to 6 months helps us  monitor your health and provide consistent, personalized care. Each day, aim for 6 glasses of water , plenty of protein in your diet and try to get up and walk/ stretch every hour for 5-10 minutes at a time.    Referrals If a referral was made during today's visit and you haven't received any updates within two weeks, please contact the referred provider directly to check on the status. You have an order for:  [x]   Bone Density     Please call for appointment: [facility] Bone Density [address] [place], [place] [address] [phone]    Make sure to wear two-piece clothing.  No lotions, powders, or deodorants the day of the appointment. Make sure to bring picture ID and insurance card.  Bring list of medications you are currently taking including any supplements.   Recommended Screenings:  Health Maintenance  Topic Date Due   Eye exam for diabetics  12/24/2023   Medicare Annual Wellness Visit  06/14/2024   Osteoporosis screening with Bone Density Scan  07/02/2024   Kidney health urinalysis for diabetes  07/31/2024   COVID-19 Vaccine (9 - 2025-26 season) 08/03/2024   Complete foot exam   10/28/2024   Hemoglobin A1C  10/30/2024   Yearly kidney function blood test for diabetes  05/01/2025   Colon Cancer Screening  08/17/2027   DTaP/Tdap/Td vaccine (5 - Td or Tdap) 10/11/2033   Pneumococcal Vaccine for age over 22  Completed   Flu Shot   Completed   Hepatitis C Screening  Completed   Zoster (Shingles) Vaccine  Completed   Meningitis B Vaccine  Aged Out   Breast Cancer Screening  Discontinued       06/22/2024   10:28 AM  Advanced Directives  Does Patient Have a Medical Advance Directive? Yes  Type of Estate Agent of [place];Living will  Copy of Healthcare Power of Attorney in Chart? No - copy requested    Vision: Annual vision screenings are recommended for early detection of glaucoma, cataracts, and diabetic retinopathy. These exams can also reveal signs of chronic conditions such as diabetes and high blood pressure.  Dental: Annual dental screenings help detect early signs of oral cancer, gum disease, and other conditions linked to overall health, including heart disease and diabetes.  Please see the attached documents for additional preventive care recommendations.

## 2024-06-23 NOTE — Progress Notes (Signed)
 "  Chief Complaint  Patient presents with   Medicare Wellness     Subjective:   Heather [name] is a 74 y.o. female who presents for a Medicare Annual Wellness Visit.  Visit info / Clinical Intake: Medicare Wellness Visit Type:: Subsequent Annual Wellness Visit Persons participating in visit and providing information:: patient Medicare Wellness Visit Mode:: In-person (required for WTM) Interpreter Needed?: No Pre-visit prep was completed: yes AWV questionnaire completed by patient prior to visit?: yes Date:: 06/22/24 Living arrangements:: (Patient-Rptd) with family/others Patient's Overall Health Status Rating: (Patient-Rptd) good Typical amount of pain: (!) (Patient-Rptd) a lot Does pain affect daily life?: (!) (Patient-Rptd) yes Are you currently prescribed opioids?: no  Dietary Habits and Nutritional Risks How many meals a day?: (Patient-Rptd) 2 Eats fruit and vegetables daily?: (Patient-Rptd) yes Most meals are obtained by: (Patient-Rptd) preparing own meals In the last 2 weeks, have you had any of the following?: (!) nausea, vomiting, diarrhea (nausea) Diabetic:: (!) yes Any non-healing wounds?: no How often do you check your BS?: continuous glucose monitor Would you like to be referred to a Nutritionist or for Diabetic Management? : no  Functional Status Activities of Daily Living (to include ambulation/medication): (Patient-Rptd) Independent Ambulation: Independent with device- listed below Medication Administration: (Patient-Rptd) Independent Home Management (perform basic housework or laundry): (Patient-Rptd) Independent Manage your own finances?: (Patient-Rptd) yes Primary transportation is: (Patient-Rptd) driving Concerns about vision?: (Patient-Rptd) no *vision screening is required for WTM* Concerns about hearing?: (Patient-Rptd) no  Fall Screening Falls in the past year?: (Patient-Rptd) 1 Number of falls in past year: (Patient-Rptd) 1 Was there an injury  with Fall?: (Patient-Rptd) 0 Fall Risk Category Calculator: (Patient-Rptd) 2 Patient Fall Risk Level: (Patient-Rptd) Moderate Fall Risk  Fall Risk Patient at Risk for Falls Due to: Impaired balance/gait Fall risk Follow up: Falls evaluation completed; Falls prevention discussed  Home and Transportation Safety: All rugs have non-skid backing?: (Patient-Rptd) N/A, no rugs All stairs or steps have railings?: (Patient-Rptd) yes Grab bars in the bathtub or shower?: (!) (Patient-Rptd) no Have non-skid surface in bathtub or shower?: (Patient-Rptd) yes Good home lighting?: (Patient-Rptd) yes Regular seat belt use?: (Patient-Rptd) yes Hospital stays in the last year:: (!) (Patient-Rptd) yes How many hospital stays:: (Patient-Rptd) 1  Cognitive Assessment Difficulty concentrating, remembering, or making decisions? : (Patient-Rptd) no Will 6CIT or Mini Cog be Completed: no 6CIT or Mini Cog Declined: patient alert, oriented, able to answer questions appropriately and recall recent events  Advance Directives (For Healthcare) Does Patient Have a Medical Advance Directive?: Yes Does patient want to make changes to medical advance directive?: Yes (MAU/Ambulatory/Procedural Areas - Information given) Type of Advance Directive: Healthcare Power of [place]; Living will Copy of Healthcare Power of Attorney in Chart?: No - copy requested Healthcare Power of Attorney Requested and Now in Chart: Copy in chart Copy of Living Will in Chart?: No - copy requested Living Will Requested and Now in Chart: Copy in chart  Reviewed/Updated  Reviewed/Updated: Reviewed All (Medical, Surgical, Family, Medications, Allergies, Care Teams, Patient Goals)    Allergies (verified) Tramadol , Farxiga  [dapagliflozin ], Flagyl [metronidazole], Iodine, Jardiance  [empagliflozin ], Lisinopril , Cholestyramine, Metformin  and related, Prandin  [repaglinide ], and Sulfonamide derivatives   Current Medications (verified) Outpatient  Encounter Medications as of 06/23/2024  Medication Sig   ezetimibe  (ZETIA ) 10 MG tablet TAKE 1 TABLET BY MOUTH EVERY DAY   famotidine  (PEPCID ) 10 MG tablet Take 10 mg by mouth daily as needed for heartburn or indigestion.   gabapentin  (NEURONTIN ) 600 MG tablet Take 2 tablets (  1,200 mg total) by mouth at bedtime.   hydrochlorothiazide  (HYDRODIURIL ) 25 MG tablet Take 1 tablet (25 mg total) by mouth daily.   icosapent  Ethyl (VASCEPA ) 1 g capsule Take 2 capsules (2 g total) by mouth 2 (two) times daily.   insulin  lispro (HUMALOG  KWIKPEN) 100 UNIT/ML KwikPen Using as directed with SS   Inject 4-10 units into the skin 3 times a day before meals prn   < 150  0 units 151-200  2 units 201-250  4 units   >250      8 units (Patient taking differently: as needed. Using as directed with SS   Inject 4-10 units into the skin 3 times a day before meals prn   < 150  0 units 151-200  2 units 201-250  4 units   >250      8 units)   LANTUS  SOLOSTAR 100 UNIT/ML Solostar Pen Inject 12 Units into the skin in the morning.   metoprolol  succinate (TOPROL  XL) 25 MG 24 hr tablet Take 0.5 tablets (12.5 mg total) by mouth daily.   olmesartan  (BENICAR ) 40 MG tablet Take 1 tablet (40 mg total) by mouth daily.   ondansetron  (ZOFRAN -ODT) 4 MG disintegrating tablet Take 1 tablet (4 mg total) by mouth every 8 (eight) hours as needed for nausea or vomiting. Please schedule an office visit for further refills.   rosuvastatin  (CRESTOR ) 5 MG tablet Take 1 tablet (5 mg total) by mouth daily.   tirzepatide  (MOUNJARO ) 5 MG/0.5ML Pen Inject 5 mg into the skin once a week.   tiZANidine  (ZANAFLEX ) 4 MG tablet Take 1 tablet (4 mg total) by mouth every 8 (eight) hours as needed (back pain, muscle spasms).   BD PEN NEEDLE MICRO U/F 32G X 6 MM MISC Inject into the skin as directed. (Patient not taking: Reported on 06/23/2024)   Blood Glucose Monitoring Suppl (ONE TOUCH ULTRA 2) w/Device KIT 1 each by Does not apply route 4 (four) times daily. Use device  to monitor glucose levels 4 times per day; E11.9   Blood Glucose Monitoring Suppl DEVI May substitute to any manufacturer covered by patient's insurance. (Patient not taking: Reported on 06/23/2024)   EMBECTA PEN NEEDLE ULTRAFINE 32G X 6 MM MISC USE AS DIRECTED FOR SAXENDA INJECTIONS (Patient not taking: Reported on 06/23/2024)   glucose blood (ACCU-CHEK GUIDE TEST) test strip USE AS DIRECTED 3 TIMES A DAY (MORNING, NOON, AND BEDTIME) (Patient not taking: Reported on 06/23/2024)   Glucose Blood (BLOOD GLUCOSE TEST STRIPS) STRP May substitute to any manufacturer covered by patient's insurance. Patient may test up to two times daily as needed. (Patient not taking: Reported on 06/23/2024)   glucose blood (ONETOUCH ULTRA) test strip Check sugars 1-4 times daily prn  IDDM (Patient not taking: Reported on 06/23/2024)   Insulin  Pen Needle (BD PEN NEEDLE MICRO ULTRAFINE) 32G X 6 MM MISC For lantus  and humalog  Inject into skin as directed (Patient not taking: Reported on 06/23/2024)   Lancets (ONETOUCH DELICA PLUS LANCET33G) MISC USE TO TEST BLOOD SUGARS  ONCE DAILY (Patient not taking: Reported on 06/23/2024)   No facility-administered encounter medications on file as of 06/23/2024.    History: Past Medical History:  Diagnosis Date   Aortic stenosis    Arthritis    Colon polyps    adenomas- took 1/3 of colon   Coronary artery disease    Diabetes mellitus without complication (HCC)    Environmental allergies    GERD (gastroesophageal reflux disease)    Heart  murmur    since birth never a problem per pt   History of DVT (deep vein thrombosis) 01/16/1969   History of hiatal hernia    History of transfusion    Hyperlipidemia    Hypertension    Interstitial cystitis    Dr Aleene   PONV (postoperative nausea and vomiting)    Past Surgical History:  Procedure Laterality Date   APPENDECTOMY     BILATERAL SALPINGOOPHORECTOMY     painful cysts   CATARACT EXTRACTION, BILATERAL     COLONOSCOPY  2005   negative;  Dr Luis. F/U declined   CYSTOSCOPY     X 4; Dr Aleene   INSERTION OF MESH N/A 09/17/2021   Procedure: INSERTION OF MESH;  Surgeon: Sheldon Standing, MD;  Location: [facility];  Service: General;  Laterality: N/A;   LAPAROSCOPIC RIGHT COLECTOMY Right 08/19/2015   Procedure: LAPAROSCOPIC ASSISTED  RIGHT COLECTOMY;  Surgeon: Alm Angle, MD;  Location: [facility];  Service: General;  Laterality: Right;   LYSIS OF ADHESION N/A 09/17/2021   Procedure: LYSIS OF ADHESION;  Surgeon: Sheldon Standing, MD;  Location: [facility];  Service: General;  Laterality: N/A;   PREMAGLINANT POLYPS     TOTAL ABDOMINAL HYSTERECTOMY     metromenorrhagia   VENTRAL HERNIA REPAIR N/A 09/17/2021   Procedure: LAPAROSCOPIC VENTRAL WALL HERNIA REPAIR BILATERAL TAP BLOCK;  Surgeon: Sheldon Standing, MD;  Location: [facility];  Service: General;  Laterality: N/A;   Family History  Problem Relation Age of Onset   Heart attack Mother        in 79s   Hypertension Mother    Stroke Mother         in 59s   Other Father        Deceased, 45   Breast cancer Sister    Healthy Brother    Healthy Daughter    Healthy Son    Stroke Maternal Grandfather 50   Heart attack Paternal Grandfather        in 50s   Diabetes Neg Hx    Social History   Occupational History   Occupation: RETIRED  Tobacco Use   Smoking status: Former    Current packs/day: 0.00    Types: Cigarettes    Quit date: 05/18/1984    Years since quitting: 40.1   Smokeless tobacco: Never   Tobacco comments:    smoked 1972-1986, up to 1 ppd  Vaping Use   Vaping status: Never Used  Substance and Sexual Activity   Alcohol use: No    Alcohol/week: 0.0 standard drinks of alcohol   Drug use: No   Sexual activity: Never   Tobacco Counseling Counseling given: Not Answered Tobacco comments: smoked 1972-1986, up to 1 ppd  SDOH Screenings   Food Insecurity: Patient Declined (04/24/2024)  Housing: Low Risk (06/23/2024)  Transportation Needs: No Transportation Needs (06/23/2024)  Utilities:  Not At Risk (06/23/2024)  Alcohol Screen: Low Risk (07/03/2022)  Depression (PHQ2-9): Low Risk (06/23/2024)  Financial Resource Strain: Medium Risk (04/24/2024)  Physical Activity: Inactive (06/23/2024)  Social Connections: Socially Isolated (06/23/2024)  Stress: No Stress Concern Present (06/23/2024)  Recent Concern: Stress - Stress Concern Present (04/24/2024)  Tobacco Use: Medium Risk (06/23/2024)  Health Literacy: Adequate Health Literacy (06/23/2024)   See flowsheets for full screening details  Depression Screen PHQ 2 & 9 Depression Scale- Over the past 2 weeks, how often have you been bothered by any of the following problems? Little interest or pleasure in doing things: 0 Feeling down,  depressed, or hopeless (PHQ Adolescent also includes...irritable): 0 PHQ-2 Total Score: 0 Trouble falling or staying asleep, or sleeping too much: 0 Feeling tired or having little energy: 0 Poor appetite or overeating (PHQ Adolescent also includes...weight loss): 0 Feeling bad about yourself - or that you are a failure or have let yourself or your family down: 0 Trouble concentrating on things, such as reading the newspaper or watching television (PHQ Adolescent also includes...like school work): 0 Moving or speaking so slowly that other people could have noticed. Or the opposite - being so fidgety or restless that you have been moving around a lot more than usual: 0 Thoughts that you would be better off dead, or of hurting yourself in some way: 0 PHQ-9 Total Score: 0 If you checked off any problems, how difficult have these problems made it for you to do your work, take care of things at home, or get along with other people?: Not difficult at all  Depression Treatment Depression Interventions/Treatment : EYV7-0 Score <4 Follow-up Not Indicated     Goals Addressed               This Visit's Progress     Patient Stated (pt-stated)        Not at this time/2026             Objective:    Today's  Vitals   06/23/24 1145  BP: 110/60  Pulse: 65  SpO2: 99%  Weight: 172 lb 9.6 oz (78.3 kg)  Height: 4' 9.5 (1.461 m)   Body mass index is 36.7 kg/m.  Hearing/Vision screen Hearing Screening - Comments:: Denies hearing difficulties   Vision Screening - Comments:: Had cataract surgery/Dr. Koop/ UTD Immunizations and Health Maintenance Health Maintenance  Topic Date Due   OPHTHALMOLOGY EXAM  12/24/2023   Bone Density Scan  07/02/2024   Diabetic kidney evaluation - Urine ACR  07/31/2024   COVID-19 Vaccine (9 - 2025-26 season) 08/03/2024   FOOT EXAM  10/28/2024   HEMOGLOBIN A1C  10/30/2024   Diabetic kidney evaluation - eGFR measurement  05/01/2025   Medicare Annual Wellness (AWV)  06/23/2025   Colonoscopy  08/17/2027   DTaP/Tdap/Td (5 - Td or Tdap) 10/11/2033   Pneumococcal Vaccine: 50+ Years  Completed   Influenza Vaccine  Completed   Hepatitis C Screening  Completed   Zoster Vaccines- Shingrix  Completed   Meningococcal B Vaccine  Aged Out   Mammogram  Discontinued        Assessment/Plan:  This is a routine wellness examination for [place].  Patient Care Team: Geofm Glade PARAS, MD as PCP - General (Internal Medicine) Mona Vinie BROCKS, MD as PCP - Cardiology (Cardiology) Luis Purchase, MD as Consulting Physician (Gastroenterology) Kassie Mallick, MD (Inactive) as Consulting Physician (Endocrinology) Rozella Toribio BROCKS, [employer] (Inactive) (Pharmacist) Armbruster, Elspeth SQUIBB, MD as Consulting Physician (Gastroenterology) Sheldon Elspeth, MD as Consulting Physician (General Surgery) Lita Lye, OD as Referring Physician (Optometry)  I have personally reviewed and noted the following in the patients chart:   Medical and social history Use of alcohol, tobacco or illicit drugs  Current medications and supplements including opioid prescriptions. Functional ability and status Nutritional status Physical activity Advanced directives List of other physicians Hospitalizations,  surgeries, and ER visits in previous 12 months Vitals Screenings to include cognitive, depression, and falls Referrals and appointments  Orders Placed This Encounter  Procedures   DG Bone Density    Standing Status:   Future    Expiration Date:  06/23/2025    Reason for Exam (SYMPTOM  OR DIAGNOSIS REQUIRED):   post menopausal    Preferred imaging location?:   [address]   In addition, I have reviewed and discussed with patient certain preventive protocols, quality metrics, and best practice recommendations. A written personalized care plan for preventive services as well as general preventive health recommendations were provided to patient.   Alix Stowers L Majed Pellegrin, CMA   06/23/2024   Return in 1 year (on 06/23/2025).  After Visit Summary: (MyChart) Due to this being a telephonic visit, the after visit summary with patients personalized plan was offered to patient via MyChart   Nurse Notes: HM Addressed: DEXA ordered "

## 2024-10-30 ENCOUNTER — Ambulatory Visit: Admitting: Internal Medicine

## 2024-11-14 ENCOUNTER — Ambulatory Visit: Admitting: Emergency Medicine

## 2025-02-23 ENCOUNTER — Ambulatory Visit (HOSPITAL_COMMUNITY)
